# Patient Record
Sex: Male | Born: 1945 | Race: White | Hispanic: No | Marital: Married | Smoking: Former smoker
Health system: Southern US, Community
[De-identification: ages and names within clinical notes are randomized; demographics above are authoritative.]

## PROBLEM LIST (undated history)

## (undated) DIAGNOSIS — N184 Chronic kidney disease, stage 4 (severe): Secondary | ICD-10-CM

## (undated) DIAGNOSIS — Z7902 Long term (current) use of antithrombotics/antiplatelets: Secondary | ICD-10-CM

## (undated) DIAGNOSIS — K219 Gastro-esophageal reflux disease without esophagitis: Secondary | ICD-10-CM

## (undated) DIAGNOSIS — J449 Chronic obstructive pulmonary disease, unspecified: Secondary | ICD-10-CM

## (undated) DIAGNOSIS — R011 Cardiac murmur, unspecified: Secondary | ICD-10-CM

## (undated) DIAGNOSIS — Z9889 Other specified postprocedural states: Secondary | ICD-10-CM

## (undated) DIAGNOSIS — I358 Other nonrheumatic aortic valve disorders: Secondary | ICD-10-CM

## (undated) DIAGNOSIS — K7460 Unspecified cirrhosis of liver: Secondary | ICD-10-CM

## (undated) DIAGNOSIS — Z87891 Personal history of nicotine dependence: Secondary | ICD-10-CM

## (undated) DIAGNOSIS — Z7982 Long term (current) use of aspirin: Secondary | ICD-10-CM

## (undated) DIAGNOSIS — M1990 Unspecified osteoarthritis, unspecified site: Secondary | ICD-10-CM

## (undated) DIAGNOSIS — I2510 Atherosclerotic heart disease of native coronary artery without angina pectoris: Secondary | ICD-10-CM

## (undated) DIAGNOSIS — K922 Gastrointestinal hemorrhage, unspecified: Secondary | ICD-10-CM

## (undated) DIAGNOSIS — K5730 Diverticulosis of large intestine without perforation or abscess without bleeding: Secondary | ICD-10-CM

## (undated) DIAGNOSIS — E7800 Pure hypercholesterolemia, unspecified: Secondary | ICD-10-CM

## (undated) DIAGNOSIS — D509 Iron deficiency anemia, unspecified: Secondary | ICD-10-CM

## (undated) DIAGNOSIS — I5189 Other ill-defined heart diseases: Secondary | ICD-10-CM

## (undated) DIAGNOSIS — Z79891 Long term (current) use of opiate analgesic: Secondary | ICD-10-CM

## (undated) DIAGNOSIS — K635 Polyp of colon: Secondary | ICD-10-CM

## (undated) DIAGNOSIS — Z87442 Personal history of urinary calculi: Secondary | ICD-10-CM

## (undated) DIAGNOSIS — Z9289 Personal history of other medical treatment: Secondary | ICD-10-CM

## (undated) DIAGNOSIS — N183 Chronic kidney disease, stage 3 (moderate): Secondary | ICD-10-CM

## (undated) DIAGNOSIS — N2581 Secondary hyperparathyroidism of renal origin: Secondary | ICD-10-CM

## (undated) DIAGNOSIS — I700 Atherosclerosis of aorta: Secondary | ICD-10-CM

## (undated) DIAGNOSIS — I10 Essential (primary) hypertension: Secondary | ICD-10-CM

## (undated) DIAGNOSIS — C689 Malignant neoplasm of urinary organ, unspecified: Secondary | ICD-10-CM

## (undated) DIAGNOSIS — I70219 Atherosclerosis of native arteries of extremities with intermittent claudication, unspecified extremity: Secondary | ICD-10-CM

## (undated) DIAGNOSIS — D631 Anemia in chronic kidney disease: Secondary | ICD-10-CM

## (undated) DIAGNOSIS — J189 Pneumonia, unspecified organism: Secondary | ICD-10-CM

## (undated) DIAGNOSIS — C679 Malignant neoplasm of bladder, unspecified: Secondary | ICD-10-CM

## (undated) HISTORY — DX: Chronic kidney disease, stage 3 (moderate): N18.3

## (undated) HISTORY — DX: Malignant neoplasm of bladder, unspecified: C67.9

## (undated) HISTORY — PX: FEMUR FRACTURE SURGERY: SHX633

## (undated) HISTORY — PX: MCT 3D RECONSTRUCTION (ARMC HX): HXRAD1281

## (undated) HISTORY — PX: OTHER SURGICAL HISTORY: SHX169

## (undated) HISTORY — PX: CARDIAC CATHETERIZATION: SHX172

## (undated) HISTORY — DX: Malignant neoplasm of urinary organ, unspecified: C68.9

---

## 2005-06-12 ENCOUNTER — Ambulatory Visit: Payer: Self-pay | Admitting: Pain Medicine

## 2005-06-19 ENCOUNTER — Ambulatory Visit: Payer: Self-pay | Admitting: Pain Medicine

## 2005-06-27 ENCOUNTER — Ambulatory Visit: Payer: Self-pay | Admitting: Pain Medicine

## 2005-07-08 ENCOUNTER — Ambulatory Visit: Payer: Self-pay | Admitting: Pain Medicine

## 2005-07-25 ENCOUNTER — Ambulatory Visit: Payer: Self-pay | Admitting: Pain Medicine

## 2005-08-28 ENCOUNTER — Ambulatory Visit: Payer: Self-pay | Admitting: Pain Medicine

## 2005-09-02 ENCOUNTER — Emergency Department (HOSPITAL_COMMUNITY): Admission: EM | Admit: 2005-09-02 | Discharge: 2005-09-02 | Payer: Self-pay | Admitting: Emergency Medicine

## 2005-09-17 ENCOUNTER — Ambulatory Visit: Payer: Self-pay | Admitting: Pain Medicine

## 2005-09-30 ENCOUNTER — Ambulatory Visit: Payer: Self-pay | Admitting: Pain Medicine

## 2005-10-08 ENCOUNTER — Ambulatory Visit: Payer: Self-pay | Admitting: Pain Medicine

## 2005-11-04 ENCOUNTER — Ambulatory Visit: Payer: Self-pay | Admitting: Pain Medicine

## 2005-11-26 ENCOUNTER — Ambulatory Visit: Payer: Self-pay | Admitting: Pain Medicine

## 2005-12-26 ENCOUNTER — Ambulatory Visit: Payer: Self-pay | Admitting: Pain Medicine

## 2006-01-22 ENCOUNTER — Ambulatory Visit: Payer: Self-pay | Admitting: Pain Medicine

## 2006-02-18 ENCOUNTER — Ambulatory Visit: Payer: Self-pay | Admitting: Pain Medicine

## 2006-03-18 ENCOUNTER — Ambulatory Visit: Payer: Self-pay | Admitting: Pain Medicine

## 2006-04-15 ENCOUNTER — Ambulatory Visit: Payer: Self-pay | Admitting: Pain Medicine

## 2006-04-23 ENCOUNTER — Ambulatory Visit: Payer: Self-pay | Admitting: Pain Medicine

## 2006-05-08 ENCOUNTER — Ambulatory Visit: Payer: Self-pay | Admitting: Pain Medicine

## 2006-06-09 ENCOUNTER — Ambulatory Visit: Payer: Self-pay | Admitting: Pain Medicine

## 2006-07-08 ENCOUNTER — Ambulatory Visit: Payer: Self-pay | Admitting: Pain Medicine

## 2006-08-07 ENCOUNTER — Ambulatory Visit: Payer: Self-pay | Admitting: Pain Medicine

## 2006-08-13 ENCOUNTER — Ambulatory Visit: Payer: Self-pay | Admitting: Pain Medicine

## 2006-09-01 ENCOUNTER — Ambulatory Visit: Payer: Self-pay | Admitting: Pain Medicine

## 2006-09-29 ENCOUNTER — Ambulatory Visit: Payer: Self-pay | Admitting: Pain Medicine

## 2006-10-27 ENCOUNTER — Ambulatory Visit: Payer: Self-pay | Admitting: Pain Medicine

## 2006-11-27 ENCOUNTER — Ambulatory Visit: Payer: Self-pay | Admitting: Pain Medicine

## 2006-12-17 ENCOUNTER — Ambulatory Visit: Payer: Self-pay | Admitting: Pain Medicine

## 2007-01-01 ENCOUNTER — Ambulatory Visit: Payer: Self-pay | Admitting: Pain Medicine

## 2007-01-21 ENCOUNTER — Ambulatory Visit: Payer: Self-pay | Admitting: Pain Medicine

## 2007-03-05 ENCOUNTER — Ambulatory Visit: Payer: Self-pay | Admitting: Pain Medicine

## 2007-03-23 ENCOUNTER — Ambulatory Visit: Payer: Self-pay | Admitting: Pain Medicine

## 2007-04-23 ENCOUNTER — Ambulatory Visit: Payer: Self-pay | Admitting: Pain Medicine

## 2007-04-27 ENCOUNTER — Ambulatory Visit: Payer: Self-pay | Admitting: Pain Medicine

## 2007-05-26 ENCOUNTER — Ambulatory Visit: Payer: Self-pay | Admitting: Pain Medicine

## 2007-06-29 ENCOUNTER — Ambulatory Visit: Payer: Self-pay | Admitting: Pain Medicine

## 2007-07-28 ENCOUNTER — Ambulatory Visit: Payer: Self-pay | Admitting: Pain Medicine

## 2007-08-12 ENCOUNTER — Ambulatory Visit: Payer: Self-pay | Admitting: Pain Medicine

## 2007-09-03 ENCOUNTER — Ambulatory Visit: Payer: Self-pay | Admitting: Pain Medicine

## 2007-09-28 ENCOUNTER — Ambulatory Visit: Payer: Self-pay | Admitting: Pain Medicine

## 2007-10-29 ENCOUNTER — Ambulatory Visit: Payer: Self-pay | Admitting: Pain Medicine

## 2007-11-25 ENCOUNTER — Ambulatory Visit: Payer: Self-pay | Admitting: Pain Medicine

## 2007-12-22 ENCOUNTER — Ambulatory Visit: Payer: Self-pay | Admitting: Pain Medicine

## 2008-01-18 ENCOUNTER — Ambulatory Visit: Payer: Self-pay | Admitting: Pain Medicine

## 2008-02-18 ENCOUNTER — Ambulatory Visit: Payer: Self-pay | Admitting: Pain Medicine

## 2008-03-16 ENCOUNTER — Ambulatory Visit: Payer: Self-pay | Admitting: Pain Medicine

## 2008-04-21 ENCOUNTER — Ambulatory Visit: Payer: Self-pay | Admitting: Pain Medicine

## 2008-05-18 ENCOUNTER — Ambulatory Visit: Payer: Self-pay | Admitting: Pain Medicine

## 2008-06-16 ENCOUNTER — Ambulatory Visit: Payer: Self-pay | Admitting: Pain Medicine

## 2008-06-27 ENCOUNTER — Ambulatory Visit: Payer: Self-pay | Admitting: Pain Medicine

## 2008-07-18 ENCOUNTER — Ambulatory Visit: Payer: Self-pay | Admitting: Pain Medicine

## 2008-08-18 ENCOUNTER — Ambulatory Visit: Payer: Self-pay | Admitting: Pain Medicine

## 2008-09-05 ENCOUNTER — Ambulatory Visit: Payer: Self-pay | Admitting: Pain Medicine

## 2008-10-05 ENCOUNTER — Ambulatory Visit: Payer: Self-pay | Admitting: Pain Medicine

## 2008-10-31 ENCOUNTER — Ambulatory Visit: Payer: Self-pay | Admitting: Pain Medicine

## 2008-11-28 ENCOUNTER — Ambulatory Visit: Payer: Self-pay | Admitting: Pain Medicine

## 2008-12-28 ENCOUNTER — Ambulatory Visit: Payer: Self-pay | Admitting: Pain Medicine

## 2009-01-30 ENCOUNTER — Ambulatory Visit: Payer: Self-pay | Admitting: Pain Medicine

## 2009-04-03 ENCOUNTER — Ambulatory Visit: Payer: Self-pay | Admitting: Pain Medicine

## 2009-05-02 ENCOUNTER — Ambulatory Visit: Payer: Self-pay | Admitting: Pain Medicine

## 2009-05-15 ENCOUNTER — Ambulatory Visit: Payer: Self-pay | Admitting: Pain Medicine

## 2009-05-23 ENCOUNTER — Ambulatory Visit: Payer: Self-pay | Admitting: Pain Medicine

## 2009-07-05 ENCOUNTER — Ambulatory Visit: Payer: Self-pay | Admitting: Pain Medicine

## 2010-05-23 ENCOUNTER — Ambulatory Visit: Payer: Self-pay | Admitting: Pain Medicine

## 2010-06-01 ENCOUNTER — Ambulatory Visit: Payer: Self-pay | Admitting: Pain Medicine

## 2010-06-21 ENCOUNTER — Ambulatory Visit: Payer: Self-pay | Admitting: Pain Medicine

## 2010-07-09 ENCOUNTER — Ambulatory Visit: Payer: Self-pay | Admitting: Pain Medicine

## 2010-07-24 ENCOUNTER — Ambulatory Visit: Payer: Self-pay | Admitting: Pain Medicine

## 2010-08-23 ENCOUNTER — Ambulatory Visit: Payer: Self-pay | Admitting: Pain Medicine

## 2010-09-24 ENCOUNTER — Ambulatory Visit: Payer: Self-pay | Admitting: Pain Medicine

## 2011-04-30 ENCOUNTER — Ambulatory Visit: Payer: Self-pay | Admitting: Pain Medicine

## 2011-05-15 ENCOUNTER — Ambulatory Visit: Payer: Self-pay | Admitting: Pain Medicine

## 2011-06-06 ENCOUNTER — Ambulatory Visit: Payer: Self-pay | Admitting: Pain Medicine

## 2011-06-26 ENCOUNTER — Ambulatory Visit: Payer: Self-pay | Admitting: Pain Medicine

## 2011-07-25 ENCOUNTER — Ambulatory Visit: Payer: Self-pay | Admitting: Pain Medicine

## 2011-08-21 ENCOUNTER — Ambulatory Visit: Payer: Self-pay | Admitting: Pain Medicine

## 2011-09-11 ENCOUNTER — Ambulatory Visit: Payer: Self-pay | Admitting: Pain Medicine

## 2011-10-09 ENCOUNTER — Ambulatory Visit: Payer: Self-pay | Admitting: Pain Medicine

## 2011-11-07 ENCOUNTER — Ambulatory Visit: Payer: Self-pay | Admitting: Pain Medicine

## 2011-12-11 ENCOUNTER — Ambulatory Visit: Payer: Self-pay | Admitting: Pain Medicine

## 2012-01-09 ENCOUNTER — Ambulatory Visit: Payer: Self-pay | Admitting: Pain Medicine

## 2012-02-05 ENCOUNTER — Ambulatory Visit: Payer: Self-pay | Admitting: Pain Medicine

## 2012-02-07 ENCOUNTER — Ambulatory Visit: Payer: Self-pay | Admitting: Pain Medicine

## 2012-02-26 ENCOUNTER — Ambulatory Visit: Payer: Self-pay | Admitting: Pain Medicine

## 2012-04-02 ENCOUNTER — Ambulatory Visit: Payer: Self-pay | Admitting: Pain Medicine

## 2012-04-29 ENCOUNTER — Ambulatory Visit: Payer: Self-pay | Admitting: Pain Medicine

## 2012-06-02 ENCOUNTER — Ambulatory Visit: Payer: Self-pay | Admitting: Pain Medicine

## 2012-07-01 ENCOUNTER — Ambulatory Visit: Payer: Self-pay | Admitting: Pain Medicine

## 2012-07-30 ENCOUNTER — Ambulatory Visit: Payer: Self-pay | Admitting: Pain Medicine

## 2012-08-31 ENCOUNTER — Ambulatory Visit: Payer: Self-pay | Admitting: Pain Medicine

## 2012-09-30 ENCOUNTER — Ambulatory Visit: Payer: Self-pay | Admitting: Pain Medicine

## 2016-11-05 ENCOUNTER — Ambulatory Visit (INDEPENDENT_AMBULATORY_CARE_PROVIDER_SITE_OTHER): Payer: Medicare HMO | Admitting: Urology

## 2016-11-05 DIAGNOSIS — R310 Gross hematuria: Secondary | ICD-10-CM | POA: Diagnosis not present

## 2016-11-05 DIAGNOSIS — C673 Malignant neoplasm of anterior wall of bladder: Secondary | ICD-10-CM | POA: Diagnosis not present

## 2016-11-07 ENCOUNTER — Other Ambulatory Visit: Payer: Self-pay | Admitting: Urology

## 2016-11-07 DIAGNOSIS — R310 Gross hematuria: Secondary | ICD-10-CM

## 2016-11-07 DIAGNOSIS — C673 Malignant neoplasm of anterior wall of bladder: Secondary | ICD-10-CM

## 2016-11-12 ENCOUNTER — Telehealth: Payer: Self-pay | Admitting: Radiology

## 2016-11-12 NOTE — Telephone Encounter (Signed)
No answer & unable to LM. Need to discuss surgery scheduled with Dr Diona Fanti at [facility].

## 2016-11-13 ENCOUNTER — Other Ambulatory Visit: Payer: Self-pay | Admitting: Radiology

## 2016-11-13 DIAGNOSIS — C679 Malignant neoplasm of bladder, unspecified: Secondary | ICD-10-CM

## 2016-11-13 NOTE — Telephone Encounter (Signed)
Notified pt of surgery scheduled with Dr Diona Fanti at [facility] on 11/19/16, pre-op appt at [facility] on 11/15/16 @7 :00 & post-op appt with Dr Diona Fanti at the [place] office on 12/25/15 @10 :30. Pt voices understanding.

## 2016-11-14 ENCOUNTER — Inpatient Hospital Stay (HOSPITAL_COMMUNITY): Admission: RE | Admit: 2016-11-14 | Payer: Medicare HMO | Source: Ambulatory Visit

## 2016-11-14 NOTE — Patient Instructions (Addendum)
Jeffery Ponce  11/14/2016     @PREFPERIOPPHARMACY @   Your procedure is scheduled on 11/19/16.  Report to [facility] at 6:15 A.M.  Call this number if you have problems the morning of surgery:  [phone]   Remember:  Do not eat food or drink liquids after midnight.  Take these medicines the morning of surgery with A SIP OF WATER Valium, Oxycodone   Do not wear jewelry, make-up or nail polish.  Do not wear lotions, powders, or perfumes, or deoderant.  Do not shave 48 hours prior to surgery.  Men may shave face and neck.  Do not bring valuables to the hospital.  [facility] is not responsible for any belongings or valuables.  Contacts, dentures or bridgework may not be worn into surgery.  Leave your suitcase in the car.  After surgery it may be brought to your room.  For patients admitted to the hospital, discharge time will be determined by your treatment team.  Patients discharged the day of surgery will not be allowed to drive home.    Please read over the following fact sheets that you were given. Surgical Site Infection Prevention and Anesthesia Post-op Instructions     PATIENT INSTRUCTIONS POST-ANESTHESIA  IMMEDIATELY FOLLOWING SURGERY:  Do not drive or operate machinery for the first twenty four hours after surgery.  Do not make any important decisions for twenty four hours after surgery or while taking narcotic pain medications or sedatives.  If you develop intractable nausea and vomiting or a severe headache please notify your doctor immediately.  FOLLOW-UP:  Please make an appointment with your surgeon as instructed. You do not need to follow up with anesthesia unless specifically instructed to do so.  WOUND CARE INSTRUCTIONS (if applicable):  Keep a dry clean dressing on the anesthesia/puncture wound site if there is drainage.  Once the wound has quit draining you may leave it open to air.  Generally you should leave the bandage intact for twenty four hours  unless there is drainage.  If the epidural site drains for more than 36-48 hours please call the anesthesia department.  QUESTIONS?:  Please feel free to call your physician or the hospital operator if you have any questions, and they will be happy to assist you.      Transurethral Resection of Bladder Tumor Transurethral resection of a bladder tumor is the removal (resection) of a cancerous growth (tumor) on the inside wall of the bladder. The bladder is the organ that holds urine. The tumor is removed through the tube that carries urine out of the body (urethra). In a transurethral resection, a thin telescope with a light, a tiny camera, and an electric cutting edge (resectoscope) is passed through the urethra. In men, the opening of the urethra is at the end of the penis. In women, it is just above the vaginal opening. Tell a health care provider about:  Any allergies you have.  All medicines you are taking, including vitamins, herbs, eye drops, creams, and over-the-counter medicines.  Any problems you or family members have had with anesthetic medicines.  Any blood disorders you have.  Any surgeries you have had.  Any medical conditions you have.  Any recent urinary tract infections you have had.  Whether you are pregnant or may be pregnant. What are the risks? Generally, this is a safe procedure. However, problems may occur, including:  Infection.  Bleeding.  Allergic reactions to medicines.  Damage to other structures or organs, such as:  The urethra.  The tubes that drain urine from the kidneys into the bladder (ureters).  Pain and burning during urination.  Difficulty urinating due to partial blockage of the urethra.  Inability to urinate (urinary retention). What happens before the procedure?  Follow instructions from your health care provider about eating and drinking restrictions.  Ask your health care provider about:  Changing or stopping your regular  medicines. This is especially important if you are taking diabetes medicines or blood thinners.  Taking medicines such as aspirin and ibuprofen. These medicines can thin your blood. Do not take these medicines before your procedure if your health care provider instructs you not to.  You may have a physical exam.  You may have tests, including:  Blood tests.  Urine tests.  Electrocardiogram (ECG). This test measures the electrical activity of the heart.  You may be given antibiotic medicine to help prevent infection.  Ask your health care provider how your surgical site will be marked or identified.  Plan to have someone take you home after the procedure. What happens during the procedure?  To reduce your risk of infection:  Your health care team will wash or sanitize their hands.  Your skin will be washed with soap.  An IV tube will be inserted into one of your veins.  You will be given one or more of the following:  A medicine to help you relax (sedative).  A medicine to make you fall asleep (general anesthetic).  A medicine that is injected into your spine to numb the area below and slightly above the injection site (spinal anesthetic).  Your legs will be placed in foot rests (stirrups) so that your legs are apart and your knees are bent.  The resectoscope will be passed through your urethra and into your bladder.  The part of your bladder that is affected by the tumor will be resected using the cutting edge of the resectoscope.  The resectoscope will be removed.  A thin, flexible tube (catheter) will be passed through your urethra and into your bladder. The catheter will drain urine into a bag outside of your body.  Fluid may be passed through the catheter to keep the catheter open. The procedure may vary among health care providers and hospitals. What happens after the procedure?  Your blood pressure, heart rate, breathing rate, and blood oxygen level will be  monitored often until the medicines you were given have worn off.  You may continue to receive fluids and medicines through an IV tube.  You will have some pain. Pain medicine will be available to help you.  You will have a catheter draining your urine.  You will have blood in your urine. Your catheter may be kept in until your urine is clear.  Your urinary drainage will be monitored. If necessary, your bladder may be rinsed out (irrigated) by passing fluid through your catheter.  You will be encouraged to walk around as soon as possible.  You may have to wear compression stockings. These stockings help to prevent blood clots and reduce swelling in your legs.  Do not drive for 24 hours if you received a sedative. This information is not intended to replace advice given to you by your health care provider. Make sure you discuss any questions you have with your health care provider. Document Released: 09/21/2009 Document Revised: 07/28/2016 Document Reviewed: 08/17/2015 Elsevier Interactive Patient Education  2017 [employer] PATIENT INSTRUCTIONS POST-ANESTHESIA  IMMEDIATELY FOLLOWING SURGERY:  Do not drive or  operate machinery for the first twenty four hours after surgery.  Do not make any important decisions for twenty four hours after surgery or while taking narcotic pain medications or sedatives.  If you develop intractable nausea and vomiting or a severe headache please notify your doctor immediately.  FOLLOW-UP:  Please make an appointment with your surgeon as instructed. You do not need to follow up with anesthesia unless specifically instructed to do so.  WOUND CARE INSTRUCTIONS (if applicable):  Keep a dry clean dressing on the anesthesia/puncture wound site if there is drainage.  Once the wound has quit draining you may leave it open to air.  Generally you should leave the bandage intact for twenty four hours unless there is drainage.  If the epidural site drains for more than  36-48 hours please call the anesthesia department.  QUESTIONS?:  Please feel free to call your physician or the hospital operator if you have any questions, and they will be happy to assist you.

## 2016-11-15 ENCOUNTER — Encounter (HOSPITAL_COMMUNITY)
Admission: RE | Admit: 2016-11-15 | Discharge: 2016-11-15 | Disposition: A | Discharge status: Home / Self Care | Payer: Medicare HMO | Source: Ambulatory Visit | Attending: Urology | Admitting: Urology

## 2016-11-15 ENCOUNTER — Other Ambulatory Visit: Payer: Self-pay

## 2016-11-15 ENCOUNTER — Encounter (HOSPITAL_COMMUNITY): Payer: Self-pay

## 2016-11-15 ENCOUNTER — Ambulatory Visit (HOSPITAL_COMMUNITY)
Admission: RE | Admit: 2016-11-15 | Discharge: 2016-11-15 | Disposition: A | Discharge status: Home / Self Care | Payer: Medicare HMO | Source: Ambulatory Visit | Attending: Urology | Admitting: Urology

## 2016-11-15 DIAGNOSIS — Z01818 Encounter for other preprocedural examination: Secondary | ICD-10-CM | POA: Diagnosis not present

## 2016-11-15 DIAGNOSIS — K8020 Calculus of gallbladder without cholecystitis without obstruction: Secondary | ICD-10-CM | POA: Insufficient documentation

## 2016-11-15 DIAGNOSIS — R310 Gross hematuria: Secondary | ICD-10-CM | POA: Diagnosis not present

## 2016-11-15 DIAGNOSIS — C673 Malignant neoplasm of anterior wall of bladder: Secondary | ICD-10-CM | POA: Insufficient documentation

## 2016-11-15 DIAGNOSIS — Z01812 Encounter for preprocedural laboratory examination: Secondary | ICD-10-CM | POA: Diagnosis present

## 2016-11-15 HISTORY — DX: Unspecified osteoarthritis, unspecified site: M19.90

## 2016-11-15 HISTORY — DX: Pure hypercholesterolemia, unspecified: E78.00

## 2016-11-15 HISTORY — DX: Personal history of urinary calculi: Z87.442

## 2016-11-15 LAB — BASIC METABOLIC PANEL
ANION GAP: 7 (ref 5–15)
BUN: 19 mg/dL (ref 6–20)
CO2: 27 mmol/L (ref 22–32)
Calcium: 9.5 mg/dL (ref 8.9–10.3)
Chloride: 104 mmol/L (ref 101–111)
Creatinine, Ser: 1.22 mg/dL (ref 0.61–1.24)
GFR calc Af Amer: >60 mL/min (ref >60)
GFR, EST NON AFRICAN AMERICAN: 58 mL/min — AB (ref >60)
GLUCOSE: 131 mg/dL — AB (ref 65–99)
POTASSIUM: 4.5 mmol/L (ref 3.5–5.1)
Sodium: 138 mmol/L (ref 135–145)

## 2016-11-15 LAB — CBC
HEMATOCRIT: 45.2 % (ref 39.0–52.0)
Hemoglobin: 14.6 g/dL (ref 13.0–17.0)
MCH: 30.5 pg (ref 26.0–34.0)
MCHC: 32.3 g/dL (ref 30.0–36.0)
MCV: 94.4 fL (ref 78.0–100.0)
PLATELETS: 254 10*3/uL (ref 150–400)
RBC: 4.79 MIL/uL (ref 4.22–5.81)
RDW: 13.1 % (ref 11.5–15.5)
WBC: 9.9 10*3/uL (ref 4.0–10.5)

## 2016-11-15 MED ORDER — IOPAMIDOL (ISOVUE-300) INJECTION 61%
125.0000 mL | Freq: Once | INTRAVENOUS | Status: AC | PRN
  Administered 2016-11-15: 125 mL via INTRAVENOUS
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
## 2016-11-19 ENCOUNTER — Ambulatory Visit (HOSPITAL_COMMUNITY): Payer: Medicare HMO | Admitting: Anesthesiology

## 2016-11-19 ENCOUNTER — Encounter (HOSPITAL_COMMUNITY): Payer: Self-pay | Admitting: *Deleted

## 2016-11-19 ENCOUNTER — Encounter (HOSPITAL_COMMUNITY)
Admission: RE | Disposition: A | Discharge status: Home / Self Care | Payer: Self-pay | Source: Ambulatory Visit | Attending: Urology

## 2016-11-19 ENCOUNTER — Ambulatory Visit (HOSPITAL_COMMUNITY)
Admission: RE | Admit: 2016-11-19 | Discharge: 2016-11-19 | Disposition: A | Discharge status: Home / Self Care | Payer: Medicare HMO | Source: Ambulatory Visit | Attending: Urology | Admitting: Urology

## 2016-11-19 DIAGNOSIS — F17210 Nicotine dependence, cigarettes, uncomplicated: Secondary | ICD-10-CM | POA: Insufficient documentation

## 2016-11-19 DIAGNOSIS — R310 Gross hematuria: Secondary | ICD-10-CM | POA: Diagnosis not present

## 2016-11-19 DIAGNOSIS — D494 Neoplasm of unspecified behavior of bladder: Secondary | ICD-10-CM | POA: Diagnosis not present

## 2016-11-19 DIAGNOSIS — M1990 Unspecified osteoarthritis, unspecified site: Secondary | ICD-10-CM | POA: Diagnosis not present

## 2016-11-19 DIAGNOSIS — Z87892 Personal history of anaphylaxis: Secondary | ICD-10-CM | POA: Insufficient documentation

## 2016-11-19 DIAGNOSIS — C679 Malignant neoplasm of bladder, unspecified: Secondary | ICD-10-CM

## 2016-11-19 DIAGNOSIS — Z87442 Personal history of urinary calculi: Secondary | ICD-10-CM | POA: Diagnosis not present

## 2016-11-19 DIAGNOSIS — Z9889 Other specified postprocedural states: Secondary | ICD-10-CM | POA: Insufficient documentation

## 2016-11-19 DIAGNOSIS — Z880 Allergy status to penicillin: Secondary | ICD-10-CM | POA: Diagnosis not present

## 2016-11-19 DIAGNOSIS — C675 Malignant neoplasm of bladder neck: Secondary | ICD-10-CM | POA: Insufficient documentation

## 2016-11-19 DIAGNOSIS — E7800 Pure hypercholesterolemia, unspecified: Secondary | ICD-10-CM | POA: Insufficient documentation

## 2016-11-19 HISTORY — PX: TRANSURETHRAL RESECTION OF BLADDER TUMOR: SHX2575

## 2016-11-19 SURGERY — TURBT (TRANSURETHRAL RESECTION OF BLADDER TUMOR)
Anesthesia: General | Site: Bladder

## 2016-11-19 MED ORDER — MIDAZOLAM HCL 2 MG/2ML IJ SOLN
INTRAMUSCULAR | Status: AC
  Filled 2016-11-19: qty 2

## 2016-11-19 MED ORDER — PROPOFOL 10 MG/ML IV BOLUS
INTRAVENOUS | Status: DC | PRN
  Administered 2016-11-19: 20 mg via INTRAVENOUS
  Administered 2016-11-19: 120 mg via INTRAVENOUS
  Administered 2016-11-19: 20 mg via INTRAVENOUS

## 2016-11-19 MED ORDER — FENTANYL CITRATE (PF) 100 MCG/2ML IJ SOLN
25.0000 ug | INTRAMUSCULAR | Status: AC | PRN
  Administered 2016-11-19 (×2): 25 ug via INTRAVENOUS

## 2016-11-19 MED ORDER — LACTATED RINGERS IV SOLN
INTRAVENOUS | Status: DC
  Administered 2016-11-19: 07:00:00 via INTRAVENOUS

## 2016-11-19 MED ORDER — LIDOCAINE HCL (CARDIAC) 10 MG/ML IV SOLN
INTRAVENOUS | Status: DC | PRN
  Administered 2016-11-19: 50 mg via INTRAVENOUS

## 2016-11-19 MED ORDER — PROPOFOL 10 MG/ML IV BOLUS
INTRAVENOUS | Status: AC
  Filled 2016-11-19: qty 20

## 2016-11-19 MED ORDER — FENTANYL CITRATE (PF) 100 MCG/2ML IJ SOLN
25.0000 ug | INTRAMUSCULAR | Status: DC | PRN
  Administered 2016-11-19: 25 ug via INTRAVENOUS
  Administered 2016-11-19: 50 ug via INTRAVENOUS
  Administered 2016-11-19: 25 ug via INTRAVENOUS
  Filled 2016-11-19 (×2): qty 2

## 2016-11-19 MED ORDER — OXYBUTYNIN CHLORIDE 5 MG PO TABS: 5.0000 mg | ORAL_TABLET | Freq: Three times a day (TID) | ORAL | 1 refills | Status: DC | PRN

## 2016-11-19 MED ORDER — FENTANYL CITRATE (PF) 100 MCG/2ML IJ SOLN
INTRAMUSCULAR | Status: AC
  Filled 2016-11-19: qty 2

## 2016-11-19 MED ORDER — FENTANYL CITRATE (PF) 100 MCG/2ML IJ SOLN
INTRAMUSCULAR | Status: DC | PRN
  Administered 2016-11-19 (×4): 25 ug via INTRAVENOUS

## 2016-11-19 MED ORDER — MIDAZOLAM HCL 2 MG/2ML IJ SOLN
1.0000 mg | INTRAMUSCULAR | Status: DC | PRN
  Administered 2016-11-19: 2 mg via INTRAVENOUS

## 2016-11-19 MED ORDER — LIDOCAINE HCL (PF) 1 % IJ SOLN
INTRAMUSCULAR | Status: AC
  Filled 2016-11-19: qty 10

## 2016-11-19 MED ORDER — CIPROFLOXACIN IN D5W 400 MG/200ML IV SOLN
400.0000 mg | INTRAVENOUS | Status: AC
  Administered 2016-11-19: 400 mg via INTRAVENOUS
  Filled 2016-11-19: qty 200

## 2016-11-19 MED ORDER — ONDANSETRON 4 MG PO TBDP
ORAL_TABLET | ORAL | Status: AC
  Filled 2016-11-19: qty 1

## 2016-11-19 MED ORDER — STERILE WATER FOR IRRIGATION IR SOLN
Status: DC | PRN
  Administered 2016-11-19 (×2): 3000 mL

## 2016-11-19 MED ORDER — EPIRUBICIN HCL CHEMO IV INJECTION 50 MG/25ML
50.0000 mg | Freq: Once | INTRAVENOUS | Status: DC
Start: 2016-11-19 — End: 2016-11-19
  Filled 2016-11-19: qty 25

## 2016-11-19 MED ORDER — ONDANSETRON 4 MG PO TBDP
4.0000 mg | ORAL_TABLET | Freq: Once | ORAL | Status: AC
  Administered 2016-11-19: 4 mg via ORAL

## 2016-11-19 SURGICAL SUPPLY — 30 items
BAG DRAIN URO TABLE W/ADPT NS (DRAPE) ×3 IMPLANT
BAG DRN 8 ADPR NS SKTRN CSTL (DRAPE) ×1
BAG HAMPER (MISCELLANEOUS) ×3 IMPLANT
BAG URINE DRAINAGE (UROLOGICAL SUPPLIES) IMPLANT
BAG URINE LEG 500ML (DRAIN) ×1 IMPLANT
CATH FOLEY 2WAY SLVR  5CC 20FR (CATHETERS) ×2
CATH FOLEY 2WAY SLVR 5CC 20FR (CATHETERS) IMPLANT
CLOTH BEACON ORANGE TIMEOUT ST (SAFETY) ×3 IMPLANT
ELECT LOOP 22F BIPOLAR SML (ELECTROSURGICAL)
ELECT REM PT RETURN 9FT ADLT (ELECTROSURGICAL) ×3
ELECTRODE LOOP 22F BIPOLAR SML (ELECTROSURGICAL) IMPLANT
ELECTRODE REM PT RTRN 9FT ADLT (ELECTROSURGICAL) ×1 IMPLANT
EVACUATOR MICROVAS BLADDER (UROLOGICAL SUPPLIES) IMPLANT
GLOVE BIO SURGEON STRL SZ8 (GLOVE) ×3 IMPLANT
GOWN PREVENTION PLUS LG XLONG (DISPOSABLE) ×3 IMPLANT
GOWN STRL REIN XL XLG (GOWN DISPOSABLE) ×3 IMPLANT
IV NS IRRIG 3000ML ARTHROMATIC (IV SOLUTION) IMPLANT
KIT CHEMO SPILL (MISCELLANEOUS) ×3 IMPLANT
KIT ROOM TURNOVER AP CYSTO (KITS) ×3 IMPLANT
LOOP MONOPOLAR YLW (ELECTROSURGICAL) ×2 IMPLANT
MANIFOLD NEPTUNE II (INSTRUMENTS) ×3 IMPLANT
PACK CYSTO (CUSTOM PROCEDURE TRAY) ×3 IMPLANT
PAD ARMBOARD 7.5X6 YLW CONV (MISCELLANEOUS) ×3 IMPLANT
PLUG CATH AND CAP STER (CATHETERS) ×2 IMPLANT
SYR 10ML LL (SYRINGE) ×3 IMPLANT
SYRINGE IRR TOOMEY STRL 70CC (SYRINGE) ×3 IMPLANT
TUBE CONNECTING 12'X1/4 (SUCTIONS) ×1
TUBE CONNECTING 12X1/4 (SUCTIONS) ×2 IMPLANT
WATER STERILE IRR 1000ML POUR (IV SOLUTION) ×3 IMPLANT
WATER STERILE IRR 3000ML UROMA (IV SOLUTION) ×4 IMPLANT

## 2016-11-19 NOTE — Progress Notes (Signed)
Epirubicin injected into  Bladder via foley catheter by [name]. Will drain at 9:20 per MD order.

## 2016-11-19 NOTE — Discharge Instructions (Signed)
Transurethral Resection of Bladder Tumor (TURBT)  Definition:  Transurethral Resection of the Bladder Tumor is a surgical procedure used to diagnose and remove tumors within the bladder. TURBT is the most common treatment for early stage bladder cancer.   General instructions:  Your recent bladder surgery requires very little post hospital care but some definite precautions.  Despite the fact that no skin incisions were used, the area around the tumor removal site is raw and covered with scabs to promote healing and prevent bleeding. Certain precautions are needed to insure that the scabs are not disturbed over the next 2-4 weeks while the healing proceeds.  Because the raw surface inside your bladder and the irritating effects of urine you may expect frequency of urination and/or urgency (a stronger desire to urinate) and perhaps even getting up at night more often. This will usually resolve or improve slowly over the healing period. You may see some blood in your urine over the first 6 weeks. Do not be alarmed, even if the urine was clear for a while. Get off your feet and drink lots of fluids until clearing occurs. If you start to pass clots or don't improve call us.  Remove catheter on Wednesday morning as instructed.  Diet:  You may return to your normal diet immediately. Because of the raw surface of your bladder, alcohol, spicy foods, foods high in acid and drinks with caffeine may cause irritation or frequency and should be used in moderation. To keep your urine flowing freely and avoid constipation, drink plenty of fluids during the day (8-10 glasses). Tip: Avoid cranberry juice because it is very acidic.   Activity:  Your physical activity needs to be restricted somewhat.  We suggest that you reduce your activity under the circumstances until the bleeding has stopped. Heavy lifting (greater than 20 lbs.) and heavy exertion should be limited for 2-3 weeks.  Bowels:  It is important to keep  your bowels regular during the postoperative period. Straining with bowel movements can cause bleeding. A bowel movement every other day is reasonable. Use a mild laxative if needed, such as milk of magnesia 2-3 tablespoons, or 2 Dulcolax tablets. Call if you continue to have problems. If you had been taking narcotics for pain, before, during or after your surgery, you may be constipated.   Medication:  You should resume your pre-surgery medications unless told not to. In addition you may be given an antibiotic to prevent or treat infection. Antibiotics are not always necessary. All medication should be taken as prescribed until the bottles are finished unless you are having an unusual reaction to one of the drugs.  General Anesthetic, Adult  A doctor specialized in giving anesthesia (anesthesiologist) or a nurse specialized in giving anesthesia (nurse anesthetist) gives medicine that makes you sleep while a procedure is performed (general anesthetic). Once the general anesthetic has been administered, you will be in a sleeplike state in which you feel no pain. After having a general anesthetic you may feel:  Dizzy.  Weak.  Drowsy.  Confused.  These feelings are normal and can be expected to last for up to 24 hours after the procedure.   LET YOUR CAREGIVER KNOW ABOUT:  Allergies you have.  Medications you are taking, including herbs, eye drops, over the counter medications, dietary supplements, and creams.  Previous problems you have had with anesthetics or numbing medicines.  Use of cigarettes, alcohol, or illicit drugs.  Possibility of pregnancy, if this applies.  History of bleeding or  blood disorders, including blood clots and clotting disorders.  Previous surgeries you have had and types of anesthetics you have received.  Family medical history, especially anesthetic problems.  Other health problems.   AFTER THE PROCEDURE  After surgery, you will be taken to the recovery area where a  nurse will monitor your progress. You will be allowed to go home when you are awake, stable, taking fluids well, and without serious pain or complications.  For the first 24 hours following an anesthetic:  Have a responsible person with you.  Do not drive a car. If you are alone, do not take public transportation.  Do not engage in strenuous activity. You may usually resume normal activities the next day, or as advised by your caregiver.  Do not drink alcohol.  Do not take medicine that has not been prescribed by your caregiver.  Do not sign important papers or make important decisions as your judgement may be impaired.  You may resume a normal diet as directed.  Change bandages (dressings) as directed.  Only take over-the-counter or prescription medicines for pain, discomfort, or fever as directed by your caregiver.  If you have questions or problems that seem related to the anesthetic, call the hospital and ask for the anesthetist, anesthesiologist, or anesthesia department.   SEEK IMMEDIATE MEDICAL CARE IF:  You develop a rash.  You have difficulty breathing.  You have chest pain.  You have allergic problems.  You have uncontrolled nausea.  You have uncontrolled vomiting.  You develop any serious bleeding, especially from the incision site.  Document Released: 03/03/2008 Document Revised: 08/07/2011 Document Reviewed: 03/28/2011  [facility] Patient Information 2012 [place].    Foley Catheter Care, Adult A Foley catheter is a soft, flexible tube. This tube is placed into your bladder to drain pee (urine). If you go home with this catheter in place, follow the instructions below. TAKING CARE OF THE CATHETER 1. Wash your hands with soap and water. 2. Put soap and water on a clean washcloth.  Clean the skin where the tube goes into your body.  Clean away from the tube site.  Never wipe toward the tube.  Clean the area using a circular motion.  Remove all the soap. Pat the  area dry with a clean towel. For males, reposition the skin that covers the end of the penis (foreskin). 3. Attach the tube to your leg with tape or a leg strap. Do not stretch the tube tight. If you are using tape, remove any stickiness left behind by past tape you used. 4. Keep the drainage bag below your hips. Keep it off the floor. 5. Check your tube during the day. Make sure it is working and draining. Make sure the tube does not curl, twist, or bend. 6. Do not pull on the tube or try to take it out. TAKING CARE OF THE DRAINAGE BAGS You will have a large overnight drainage bag and a small leg bag. You may wear the overnight bag any time. Never wear the small bag at night. Follow the directions below. Emptying the Drainage Bag  Empty your drainage bag when it is ?- full or at least 2-3 times a day. 1. Wash your hands with soap and water. 2. Keep the drainage bag below your hips. 3. Hold the dirty bag over the toilet or clean container. 4. Open the pour spout at the bottom of the bag. Empty the pee into the toilet or container. Do not let the pour  spout touch anything. 5. Clean the pour spout with a gauze pad or cotton ball that has rubbing alcohol on it. 6. Close the pour spout. 7. Attach the bag to your leg with tape or a leg strap. 8. Wash your hands well. Changing the Drainage Bag  Change your bag once a month or sooner if it starts to smell or look dirty.  1. Wash your hands with soap and water. 2. Pinch the rubber tube so that pee does not spill out. 3. Disconnect the catheter tube from the drainage tube at the connection valve. Do not let the tubes touch anything. 4. Clean the end of the catheter tube with an alcohol wipe. Clean the end of a the drainage tube with a different alcohol wipe. 5. Connect the catheter tube to the drainage tube of the clean drainage bag. 6. Attach the new bag to the leg with tape or a leg strap. Avoid attaching the new bag too tightly. 7. Wash your  hands well. Cleaning the Drainage Bag  1. Wash your hands with soap and water. 2. Wash the bag in warm, soapy water. 3. Rinse the bag with warm water. 4. Fill the bag with a mixture of white vinegar and water (1 cup vinegar to 1 quart warm water [.2 liter vinegar to 1 liter warm water]). Close the bag and soak it for 30 minutes in the solution. 5. Rinse the bag with warm water. 6. Hang the bag to dry with the pour spout open and hanging downward. 7. Store the clean bag (once it is dry) in a clean plastic bag. 8. Wash your hands well. PREVENT INFECTION  Wash your hands before and after touching your tube.  Take showers every day. Wash the skin where the tube enters your body. Do not take baths. Replace wet leg straps with dry ones, if this applies.  Do not use powders, sprays, or lotions on the genital area. Only use creams, lotions, or ointments as told by your doctor.  For females, wipe from front to back after going to the bathroom.  Drink enough fluids to keep your pee clear or pale yellow unless you are told not to have too much fluid (fluid restriction).  Do not let the drainage bag or tubing touch or lie on the floor.  Wear cotton underwear to keep the area dry. GET HELP IF:  Your pee is cloudy or smells unusually bad.  Your tube becomes clogged.  You are not draining pee into the bag or your bladder feels full.  Your tube starts to leak. GET HELP RIGHT AWAY IF:  You have pain, puffiness (swelling), redness, or yellowish-white fluid (pus) where the tube enters the body.  You have pain in the belly (abdomen), legs, lower back, or bladder.  You have a fever.  You see blood fill the tube, or your pee is pink or red.  You feel sick to your stomach (nauseous), throw up (vomit), or have chills.  Your tube gets pulled out. MAKE SURE YOU:   Understand these instructions.  Will watch your condition.  Will get help right away if you are not doing well or get  worse. This information is not intended to replace advice given to you by your health care provider. Make sure you discuss any questions you have with your health care provider. Document Released: 03/22/2013 Document Revised: 12/16/2014 Document Reviewed: 11/11/2015 Elsevier Interactive Patient Education  2017 [employer].

## 2016-11-19 NOTE — Transfer of Care (Signed)
Immediate Anesthesia Transfer of Care Note  Patient: Jeffery Ponce  Procedure(s) Performed: Procedure(s): TRANSURETHRAL RESECTION OF BLADDER TUMOR (TURBT) WITH EPIRUBICIN INJECTION (N/A)  Patient Location: PACU  Anesthesia Type:General  Level of Consciousness: awake and patient cooperative  Airway & Oxygen Therapy: Patient Spontanous Breathing and Patient connected to nasal cannula oxygen  Post-op Assessment: Report given to RN and Post -op Vital signs reviewed and stable  Post vital signs: Reviewed and stable  Last Vitals:  Vitals:   11/19/16 0725 11/19/16 0815  BP: 130/75   Resp: 17   Temp:  36.6 C    Last Pain:  Vitals:   11/19/16 0631  PainSc: 7       Patients Stated Pain Goal: 6 ([id number])  Complications: No apparent anesthesia complications

## 2016-11-19 NOTE — Anesthesia Procedure Notes (Signed)
Procedure Name: LMA Insertion Date/Time: 11/19/2016 7:38 AM Performed by: Vista Deck Pre-anesthesia Checklist: Patient identified, Patient being monitored, Emergency Drugs available, Timeout performed and Suction available Patient Re-evaluated:Patient Re-evaluated prior to inductionOxygen Delivery Method: Circle System Utilized Preoxygenation: Pre-oxygenation with 100% oxygen Intubation Type: IV induction Ventilation: Mask ventilation without difficulty LMA: LMA inserted LMA Size: 4.0 Number of attempts: 1 Placement Confirmation: positive ETCO2 and breath sounds checked- equal and bilateral Tube secured with: Tape Dental Injury: Teeth and Oropharynx as per pre-operative assessment

## 2016-11-19 NOTE — Op Note (Signed)
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  Postoperative diagnosis: Same  Principal procedure: TURBT of 2 centimeter anterior bladder wall tumor, placement of epirubicin intravesical  Surgeon: Suzann Lazaro  Anesthesia: Gen. with LMA.  Complications: None  Specimen: Bladder tumor, to pathology.  Drains: 68 French Foley catheter  Indications: 70 year old male recently presented with gross hematuria.  Only urologic abnormality was a papillary tumor, approximately 2 centimeters wide, at the bladder neck, located anteriorly.  CT hematuria protocol was normal.  The patient presents at this point for TURBT of this papillary tumor, as well as, possibly, administration of epirubicin intravesically.  Following the procedure.  The procedure as well as risks and complications have been discussed with the patient and his wife.  These include, but are not limited to infection, bleeding, retention, anesthetic complication, inflammation from the intravesical chemotherapy.  They understand and except these and desire to proceed.  Findings: Urethra was normal.  Prostate was nonobstructive.  Mild trabeculations noted throughout the bladder.  Ureteral orifices were normal.  There was a papillary tumor located just at the bladder neck anteriorly from the 10 o'clock to the 12 o'clock position.  No other bladder lesions were seen.  Description of procedure: The patient was properly identified in the holding area.  He received preoperative Cipro for some time, although he complained of itching and was mild redness in a streak-like pattern above his vein.  This reason, Cipro was stopped.  He was taken to the operating room where general anesthetic was administered with the LMA.  He was placed in the dorsolithotomy position.  Genitalia and perineum were prepped and draped.  Proper timeout was performed.  A 26 French resectoscope sheath was placed with the obturator.  The resectoscope with the  Foroblique lens was then placed.  Inspection was then carried out in the bladder which was normal except for the previously mentioned papillary tumor at the bladder neck.  Using the cutting loop, this was resected from the inferior bladder into the prostatic urethra, from the 10 o'clock to the 2 o'clock position.  In this manner, the papillary tumor, as well as a significant amount of tissue underneath was resected.  Resected.  Chips were sent labeled "bladder tumor".  The cutting loop was then used to cauterize the resected bed.  Adequate hemostasis was achieved.  At this point, there being no other lesions within the bladder to resect, the resectoscope was removed.  I placed a 20 French Foley catheter.  Balloon filled with 10 mL of water.  This was then used to totally draining the bladder and up catheter plug was placed.  The patient was then awakened and taken to the PACU in stable condition.  In the PACU, I administered 50 milligrams of epirubicin and 50 mL of diluent.  This was left indwelling for 1 hour.  The patient tolerated procedure well.

## 2016-11-19 NOTE — Anesthesia Preprocedure Evaluation (Signed)
Anesthesia Evaluation  Patient identified by MRN, date of birth, ID band Patient awake    Reviewed: Allergy & Precautions, NPO status , Patient's Chart, lab work & pertinent test results  Airway Mallampati: II  TM Distance: >3 FB Neck ROM: Full    Dental  (+) Poor Dentition, Chipped,    Pulmonary Current Smoker,    breath sounds clear to auscultation       Cardiovascular negative cardio ROS   Rhythm:Regular Rate:Normal     Neuro/Psych    GI/Hepatic negative GI ROS,   Endo/Other    Renal/GU      Musculoskeletal  (+) Arthritis ,   Abdominal   Peds  Hematology   Anesthesia Other Findings   Reproductive/Obstetrics                             Anesthesia Physical Anesthesia Plan  ASA: II  Anesthesia Plan: General   Post-op Pain Management:    Induction: Intravenous  Airway Management Planned: LMA  Additional Equipment:   Intra-op Plan:   Post-operative Plan: Extubation in OR  Informed Consent: I have reviewed the patients History and Physical, chart, labs and discussed the procedure including the risks, benefits and alternatives for the proposed anesthesia with the patient or authorized representative who has indicated his/her understanding and acceptance.     Plan Discussed with:   Anesthesia Plan Comments:         Anesthesia Quick Evaluation

## 2016-11-19 NOTE — Anesthesia Postprocedure Evaluation (Signed)
Anesthesia Post Note  Patient: Jeffery Ponce  Procedure(s) Performed: Procedure(s) (LRB): TRANSURETHRAL RESECTION OF BLADDER TUMOR (TURBT) WITH EPIRUBICIN INJECTION (N/A)  Patient location during evaluation: PACU Anesthesia Type: MAC Level of consciousness: awake and alert Pain management: satisfactory to patient Vital Signs Assessment: post-procedure vital signs reviewed and stable Respiratory status: spontaneous breathing Cardiovascular status: stable Anesthetic complications: no    Last Vitals:  Vitals:   11/19/16 0922 11/19/16 0930  BP:  (!) 154/83  Pulse: 62 64  Resp: 12 12  Temp:      Last Pain:  Vitals:   11/19/16 0930  PainSc: 6                  Psalm Schappell

## 2016-11-21 ENCOUNTER — Encounter (HOSPITAL_COMMUNITY): Payer: Self-pay | Admitting: Urology

## 2016-11-21 ENCOUNTER — Ambulatory Visit (HOSPITAL_COMMUNITY): Payer: Medicare HMO

## 2016-11-26 MED FILL — Epirubicin HCl IV Soln 50 MG/25ML (2 MG/ML): INTRAVENOUS | Qty: 25 | Status: AC

## 2016-12-10 ENCOUNTER — Encounter (HOSPITAL_COMMUNITY): Payer: Self-pay | Admitting: Oncology

## 2016-12-10 DIAGNOSIS — C689 Malignant neoplasm of urinary organ, unspecified: Secondary | ICD-10-CM

## 2016-12-10 DIAGNOSIS — C679 Malignant neoplasm of bladder, unspecified: Secondary | ICD-10-CM

## 2016-12-10 HISTORY — DX: Malignant neoplasm of urinary organ, unspecified: C68.9

## 2016-12-10 HISTORY — DX: Malignant neoplasm of bladder, unspecified: C67.9

## 2016-12-17 ENCOUNTER — Encounter (HOSPITAL_COMMUNITY): Payer: Self-pay | Admitting: Oncology

## 2016-12-17 NOTE — Assessment & Plan Note (Addendum)
Invasive urothelial carcinoma (pT2N0) with TURBT by [name] on 11/19/2016 and epirubicin intra-bladder treatment BUT with concerns for clinically early T3 disease.  Labs today: CBC diff, CMET.  I personally reviewed and went over laboratory results with the patient.  The results are noted within this dictation.  I personally reviewed and went over radiographic studies with the patient.  The results are noted within this dictation.  CT abd/pelvis from 11/15/2016 is reviewed with the patient at which time there was a left bladder base lesion measuring 3.2 x 2.7 cm concerning for bladder malignancy without other findings suspicious for disease.  I personally reviewed the images with the patient for his education.  No chest imaging performed at this time.  Order placed for PA and lateral plain film of chest.  If equivocal or abnormality is identified on plain films, CT of chest would be indicated.  Treatment options for neoadjuvant systemic chemotherapy for invasive urothelial carcinoma are reviewed via NCCN guidelines:  1. DDMVAC with GSF support x 3-4 cycles  2. Gemcitabine and cisplatin x 4 cycles  Given his performance status and co-morbidities, will pursue option #2 with Gemcitabine/Cisplatin.  We reviewed the risks, benefits, alternatives, and side effects including, but not limited to, peripheral edema, skin rash, alopecia, nausea/vomiting, diarrhea, stomatitis, proteinuria, hematuria, decreased blood count, change in liver enzymes, infection, fever, neurotoxicity, nephrotoxicity, ototoxicity.  Will refer the patient to Gen Surg for port placement.   Refer to Anderson Malta, Nurse Navigator, for chemotherapy teaching for Cisplatin/Gemcitabine x 4 cycles.  He will return for follow-up on Day 8 of cycle 1 for follow-up and tolerance check.  Following neoadjuvant chemotherapy, he will need to see [name] for consideration of cystoprostatectomy.
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
## 2016-12-18 ENCOUNTER — Encounter (HOSPITAL_COMMUNITY): Payer: Medicare HMO | Attending: Oncology | Admitting: Oncology

## 2016-12-18 ENCOUNTER — Encounter (HOSPITAL_COMMUNITY): Payer: Medicare HMO

## 2016-12-18 ENCOUNTER — Encounter (HOSPITAL_COMMUNITY): Payer: Self-pay | Admitting: Oncology

## 2016-12-18 ENCOUNTER — Encounter (HOSPITAL_COMMUNITY): Payer: Self-pay | Admitting: Lab

## 2016-12-18 ENCOUNTER — Ambulatory Visit (HOSPITAL_COMMUNITY)
Admission: RE | Admit: 2016-12-18 | Discharge: 2016-12-18 | Disposition: A | Discharge status: Home / Self Care | Payer: Medicare HMO | Source: Ambulatory Visit | Attending: Oncology | Admitting: Oncology

## 2016-12-18 VITALS — BP 135/68 | HR 90 | Temp 99.3°F | Resp 16 | Ht 70.0 in | Wt 173.5 lb

## 2016-12-18 DIAGNOSIS — R918 Other nonspecific abnormal finding of lung field: Secondary | ICD-10-CM | POA: Diagnosis not present

## 2016-12-18 DIAGNOSIS — G8921 Chronic pain due to trauma: Secondary | ICD-10-CM | POA: Diagnosis not present

## 2016-12-18 DIAGNOSIS — C679 Malignant neoplasm of bladder, unspecified: Secondary | ICD-10-CM | POA: Insufficient documentation

## 2016-12-18 LAB — CBC WITH DIFFERENTIAL/PLATELET
BASOS ABS: 0 10*3/uL (ref 0.0–0.1)
Basophils Relative: 0 %
EOS ABS: 0.2 10*3/uL (ref 0.0–0.7)
Eosinophils Relative: 2 %
HCT: 43.3 % (ref 39.0–52.0)
HEMOGLOBIN: 14.1 g/dL (ref 13.0–17.0)
LYMPHS ABS: 3.1 10*3/uL (ref 0.7–4.0)
Lymphocytes Relative: 26 %
MCH: 30 pg (ref 26.0–34.0)
MCHC: 32.6 g/dL (ref 30.0–36.0)
MCV: 92.1 fL (ref 78.0–100.0)
Monocytes Absolute: 0.9 10*3/uL (ref 0.1–1.0)
Monocytes Relative: 8 %
NEUTROS PCT: 64 %
Neutro Abs: 7.4 10*3/uL (ref 1.7–7.7)
Platelets: 220 10*3/uL (ref 150–400)
RBC: 4.7 MIL/uL (ref 4.22–5.81)
RDW: 13 % (ref 11.5–15.5)
WBC: 11.6 10*3/uL — AB (ref 4.0–10.5)

## 2016-12-18 LAB — COMPREHENSIVE METABOLIC PANEL
ALBUMIN: 3.9 g/dL (ref 3.5–5.0)
ALT: 11 U/L — AB (ref 17–63)
AST: 14 U/L — AB (ref 15–41)
Alkaline Phosphatase: 81 U/L (ref 38–126)
Anion gap: 5 (ref 5–15)
BUN: 19 mg/dL (ref 6–20)
CALCIUM: 8.7 mg/dL — AB (ref 8.9–10.3)
CO2: 25 mmol/L (ref 22–32)
CREATININE: 1.19 mg/dL (ref 0.61–1.24)
Chloride: 107 mmol/L (ref 101–111)
GFR calc Af Amer: >60 mL/min (ref >60)
GFR calc non Af Amer: 60 mL/min — ABNORMAL LOW (ref >60)
GLUCOSE: 96 mg/dL (ref 65–99)
Potassium: 4.3 mmol/L (ref 3.5–5.1)
SODIUM: 137 mmol/L (ref 135–145)
Total Bilirubin: 0.3 mg/dL (ref 0.3–1.2)
Total Protein: 7.3 g/dL (ref 6.5–8.1)

## 2016-12-18 NOTE — Patient Instructions (Addendum)
[place] at [facility] Discharge Instructions  RECOMMENDATIONS MADE BY THE CONSULTANT AND ANY TEST RESULTS WILL BE SENT TO YOUR REFERRING PHYSICIAN.  You saw Dr. Whitney Muse and Kirby Crigler, [name], today. Labs today will be drawn. On your way out, have a chest xray completed in radiology.  We will call you with results. See Amy at checkout for appointments.  Thank you for choosing [place] at [facility] to provide your oncology and hematology care.  To afford each patient quality time with our provider, please arrive at least 15 minutes before your scheduled appointment time.    If you have a lab appointment with the [facility] please come in thru the  Main Entrance and check in at the main information desk  You need to re-schedule your appointment should you arrive 10 or more minutes late.  We strive to give you quality time with our providers, and arriving late affects you and other patients whose appointments are after yours.  Also, if you no show three or more times for appointments you may be dismissed from the clinic at the providers discretion.     Again, thank you for choosing [facility].  Our hope is that these requests will decrease the amount of time that you wait before being seen by our physicians.       _____________________________________________________________  Should you have questions after your visit to [facility], please contact our office at [phone] between the hours of 8:30 a.m. and 4:30 p.m.  Voicemails left after 4:30 p.m. will not be returned until the following business day.  For prescription refill requests, have your pharmacy contact our office.       Resources For Cancer Patients and their Caregivers ? American [employer] Society: Can assist with transportation, wigs, general needs, runs Look Good Feel Better.        352-213-7301 ? Cancer Care: Provides financial assistance,  online support groups, medication/co-pay assistance.  1-800-813-HOPE 860-413-6639) ? [place] Assists [place] cancer patients and their families through emotional , educational and financial support.  303-646-6192 ? [place] DSS Where to apply for food stamps, Medicaid and utility assistance. 408-030-1539 ? [facility]: Transportation to medical appointments. 514 263 3144 ? Social Security Administration: May apply for disability if have a Stage IV cancer. (604)606-0875 (806)591-8247 ? [employer], Disability and Transit Services: Assists with nutrition, care and transit needs. [place] Support Programs: @10RELATIVEDAYS @ > Cancer Support Group  2nd Tuesday of the month 1pm-2pm, Journey Room  > Creative Journey  3rd Tuesday of the month 1130am-1pm, Journey Room  > Look Good Feel Better  1st Wednesday of the month 10am-12 noon, Journey Room (Call [place] to register [phone])

## 2016-12-18 NOTE — Progress Notes (Unsigned)
Referral sent to [facility] for port. Records faxed on 1/10.  appt 1/10 today

## 2016-12-18 NOTE — Progress Notes (Signed)
Met with pt face to face.  Introduced myself and explain a little bit about my role as the patient navigator.  Pt given a card with all my information on it.  Told pt to call if they had any questions or concerns.  Pt verbalized understanding.  Set up for port with Dr Rosana Hoes.  Chemotherapy teaching 12/25/2016 on cisplatin and gemzar

## 2016-12-19 ENCOUNTER — Encounter (HOSPITAL_COMMUNITY): Payer: Self-pay

## 2016-12-19 ENCOUNTER — Encounter (HOSPITAL_COMMUNITY)
Admission: RE | Admit: 2016-12-19 | Discharge: 2016-12-19 | Disposition: A | Discharge status: Home / Self Care | Payer: Medicare HMO | Source: Ambulatory Visit | Attending: Surgery | Admitting: Surgery

## 2016-12-19 ENCOUNTER — Encounter (HOSPITAL_COMMUNITY): Payer: Self-pay | Admitting: Emergency Medicine

## 2016-12-19 ENCOUNTER — Telehealth (HOSPITAL_COMMUNITY): Payer: Self-pay | Admitting: *Deleted

## 2016-12-19 MED ORDER — LIDOCAINE-PRILOCAINE 2.5-2.5 % EX CREA: TOPICAL_CREAM | CUTANEOUS | 2 refills | Status: DC

## 2016-12-19 MED ORDER — PROCHLORPERAZINE MALEATE 10 MG PO TABS: 10.0000 mg | ORAL_TABLET | Freq: Four times a day (QID) | ORAL | 2 refills | Status: DC | PRN

## 2016-12-19 MED ORDER — ONDANSETRON HCL 8 MG PO TABS: 8.0000 mg | ORAL_TABLET | Freq: Three times a day (TID) | ORAL | 2 refills | Status: DC | PRN

## 2016-12-19 NOTE — Patient Instructions (Signed)
[place]   CHEMOTHERAPY INSTRUCTIONS  You have been diagnosed with bladder cancer.  We are going to treat you with 4 cycles of cisplatin and gemzar.  With the cisplatin you will receive 2 hours of pre-fluids and 2 hours of post-fluids after cisplatin.   You will see the doctor regularly throughout treatment.  We monitor your lab work prior to every treatment.  The doctor monitors your response to treatment by the way you are feeling, your blood work, and scans periodically.  You will receive the following premedications prior to receiving chemotherapy: Premeds: Aloxi - high powered nausea/vomiting prevention medication used for chemotherapy patients. Emend - high powered nausea/vomiting prevention medication used for chemotherapy patients. Dexamethasone - steroid - given to reduce the risk of you having an allergic type reaction to the chemotherapy. Dex can cause you to feel energized, nervous/anxious/jittery, make you have trouble sleeping, and/or make you feel hot/flushed in the face/neck and/or look pink/red in the face/neck. These side effects will pass as the Dex wears off. (takes 20 minutes to infuse)   POTENTIAL SIDE EFFECTS OF TREATMENT:  Cisplatin (Generic Name) Other Names: Platinol, platinum  About This Drug Cisplatin is a drug used to treat cancer. This drug is given in the vein (IV).  This drug takes 1 hour to infuse.  Possible Side Effects (More Common) . This drug may affect how your kidneys work. Your kidney function will be checked as needed. . Electrolyte changes. Your blood will be checked for electrolyte changes as needed. . High-frequency hearing loss may occur. You will get IV fluids before and during the Cisplatin infusion to help prevent this. You may also get ringing in the ears. . Bone marrow depression. This is a decrease in the number of white blood cells, red blood cells, and platelets. This may raise your risk of infection,  make you tired and weak (fatigue), and raise your risk of bleeding. . Nausea and throwing up (vomiting). These symptoms may happen within a few hours after your treatment and may last for a few days to a week. Medicines are available to stop or lessen these side effects.  Possible Side Effects (Less Common) . Effects on the nerves are called peripheral neuropathy. You may feel numbness or pain in your hands and feet. It may be hard for you to button your clothes, open jars, or walk as usual. The effect on the nerves may get worse with more doses of the drug. These effects get better in some people after the drug is stopped, but it does not get better in all people. Marland Kitchen Blurred vision or other changes in eyesight. . Soreness of the mouth and throat. You may have red areas, white patches, or sores that hurt. . Hair loss. You may notice your hair getting thin. Some patients lose their hair. Your hair often grows back when treatment is done.  Allergic Reactions Allergic reactions to this drug are rare, but may happen in some patients. Signs of allergic reactions to this drug may be a rash, fever, chills, feeling dizzy, trouble breathing, and/or feeling that your heart is beating in a fast or not normal way.  Treating Side Effects . Drink 6-8 cups of fluids each day unless your doctor has told you to limit your fluid intake due to some other health problem. A cup is 8 ounces of fluid. If you throw up or have loose bowel movements you should drink more fluids so that you do not  become dehydrated (lack water in the body due to losing too much fluid). . If you have numbness and tingling in your hands and feet, be careful when cooking, walking, and handling sharp objects and hot liquids. . Mouth care is very important. Your mouth care should consist of routine, gentle cleaning of your teeth or dentures and rinsing your mouth with a mixture of 1/2 teaspoon of salt in 8 ounces of water or  teaspoon of baking soda  in 8 ounces of water. This should be done at least after each meal and at bedtime. . If you have mouth sores, avoid mouthwash that has alcohol. Also avoid alcohol and smoking because they can bother your mouth and throat. . Talk with your nurse about getting a wig before you lose your hair. Also, call the [facility] at 800-ACS-2345 to find out information about the "Look Good, Feel Better" program close to where you live. It is a free program where women getting chemotherapy can learn about wigs, turbans and scarves as well as makeup techniques and skin and nail care.  Food and Drug Interactions There are no known interactions of Cisplatin with food. This drug may interact with other medicines. Tell your doctor and pharmacist about all the medicines and dietary supplements (vitamins, minerals, herbs and others) that you are taking at this time. The safety and use of dietary supplements and alternative diets are often not known. Using these might affect your cancer or interfere with your treatment. Until more is known, you should not use dietary supplements or alternative diets without your cancer doctor's help.  When to Call the Doctor Call your doctor or nurse right away if you have any of these symptoms: . Rash or itching . Feeling dizzy or lightheaded . Wheezing or trouble breathing . Swelling of the face . Fever of 100.5 F (38 C) or above . Chills . Easy bleeding or bruising . Decreased urine . Weight gain of 5 pounds in one week (fluid retention) . Nausea that stops you from eating or drinking . Throwing up more than 3 times a day Call your doctor or nurse as soon as possible if you have these symptoms: . Numbness, tingling, decreased feeling or weakness in fingers, toes, arms, or legs . Trouble walking or changes in the way you walk, feeling clumsy when buttoning clothes, opening jars, or other routine hand motions . Blurred vision or other changes in eyesight . Changes in  hearing, ringing in the ears . Pain in your mouth or throat that makes it hard to eat or drink . Fatigue that interferes with your daily activities  Sexual Problems and Reproductive Concerns . Infertility warning: Sexual problems and reproduction concerns may occur. In both men and women, this drug may affect your ability to have children. This cannot be determined before your treatment. Speak with your doctor or nurse if you plan to have children. Ask for information on sperm or egg banking. . In men, this drug may interfere with your ability to make sperm, but it should not change your ability to have sexual relations. . In women, menstrual bleeding may become irregular or stop while you are receiving this drug. Do not assume that you cannot become pregnant if you do not have a menstrual period. . Women may experience signs of menopause like vaginal dryness, itching, and pain during sexual relations . Genetic counseling is available for you to talk about the effects of this drug therapy on future pregnancies. Also, a  genetic counselor can look at the possible risk of problems in the unborn baby due to this medicine if an exposure happens during pregnancy. . Pregnancy warning: The drug may have harmful effects on the unborn child, so effective methods of birth control should be used during your cancer treatment. . Breast feeding warning: Women should not breast feed during treatment because this drug could enter the breast milk and badly harm a breast feeding baby.   Gemcitabine (Generic Name) Other Names: Gemzar  About This Drug Gemcitabine is used to treat cancer. This drug is given in the vein (IV).  This drug takes 30 minutes to infuse.    Possible Side Effects (Most Common) . Bone marrow depression. This is a decrease in the number of white blood cells, red blood cells, and platelets. This may raise your risk of infection, make you tired and weak (fatigue), and raise your risk of  bleeding. . Nausea and throwing up (vomiting). These symptoms may happen within a few hours after your treatment and may last up to several days. Medicines are available to stop or lessen these side effects. . Loose bowel movements (diarrhea) that may last for a few days . Fluid retention. You may have swelling of your arms, legs, face, chest or abdomen . Raised, red rash on arms, legs, back, or chest . Flu-like symptoms: fever, headache, muscle and joint aches, and fatigue (low energy, feeling weak) . Changes in your liver function. Your doctor will check your liver function as needed.  Possible Side Effects (Less Common) . Feeling short of breath . Decreased appetite (decreased hunger) . Effects on the nerves are called peripheral neuropathy. You may feel numbness, tingling, or pain in your hands and feet. It may be hard for you to button your clothes, open jars, or walk as usual. The effect on the nerves may get worse with more doses of the drug. These effects get better in some people after the drug is stopped but it does not get better in all people.  Treating Side Effects . Ask your doctor or nurse about medication that is available to help stop or lessen nausea, throwing up, loose bowel movements, headache, muscle and joint aches. . Drink 6-8 cups of fluids each day unless your doctor has told you to limit your fluid intake due to some other health problem. A cup is 8 ounces of fluid. If you throw up or have loose bowel movements you should drink more fluids so that you do not become dehydrated (lack water in the body due to losing too much fluid). . If you get a rash, do not put anything on it unless your doctor or nurse says you may. Keep the area around the rash clean and dry. Ask your doctor for medicine if your rash bothers you. . If you have numbness and tingling in your hands and feet, be careful when cooking, walking, and handling sharp objects and hot liquids.  Food and Drug  Interactions There are no known interactions of gemcitabine with food. This drug may interact with other medicines. Tell your doctor and pharmacist about all the medicines and dietary supplements (vitamins, minerals, herbs and others) that you are taking at this time. The safety and use of dietary supplements and alternative diets are often not known. Using these might affect your cancer or interfere with your treatment. Until more is known, you should not use dietary supplements or alternative diets without your cancer doctor's help.  When to Call the Doctor  Call your doctor or nurse right away if you have any of these symptoms: . Fever of 100.5 F (38 C) or above . Chills . Bleeding or bruising that is not normal . Wheezing or trouble breathing . Rash or itching . Nausea that stops you from eating or drinking . Loose bowel movements (diarrhea) more than 4 times a day or diarrhea with weakness or lightheadedness . Swelling of your arms, legs, face, chest or abdomen (fluid retention) Call your doctor or nurse as soon as possible if any of these symptoms happen: . Numbness, tingling, decreased feeling or weakness in fingers, toes, arms, or legs . Trouble walking or changes in the way you walk, feeling clumsy when buttoning clothes, opening jars, or other routine hand motions . Weight gain of 5 pounds in one week (fluid retention) . Lasting loss of appetite or rapid weight loss of five pounds in a week . Fatigue that interferes with your daily activities  Sexual Problems and Reproduction Concerns . Infertility warning: Sexual problems and reproduction concerns may happen. In both men and women, this drug may affect your ability to have children. This cannot be determined before your treatment. Talk with your doctor or nurse if you plan to have children. Ask for information on sperm or egg banking. . In men, this drug may interfere with your ability to make sperm, but it should not change your  ability to have sexual relations. . In women, menstrual bleeding may become irregular or stop while you are getting this drug. Do not assume that you cannot become pregnant if you do not have a menstrual period. . Women may go through signs of menopause (change of life) like vaginal dryness or itching. Vaginal lubricants can be used to lessen vaginal dryness, itching, and pain during sexual relations. . Genetic counseling is available for you to talk about the effects of this drug therapy on future pregnancies. Also, a genetic counselor can look at the possible risk of problems in the unborn baby due to this medicine if an exposure happens during pregnancy. . Pregnancy warning: This drug may have harmful effects on the unborn child, so effective methods of birth control should be used during your cancer treatment. . Breast feeding warning: It is not known if this drug passes into breast milk. For this reason, women should talk to their doctor about the risks and benefits of breast feeding during treatment with this drug because this drug may enter the breast milk and badly harm a breast feeding baby.  EDUCATIONAL MATERIALS GIVEN AND REVIEWED: Chemotherapy and you book, nutrition book, information on cisplatin and gemzar  SELF CARE ACTIVITIES WHILE ON CHEMOTHERAPY: Hydration Increase your fluid intake 48 hours prior to treatment and drink at least 8 to 12 cups (64 ounces) of water/decaff beverages per day after treatment. You can still have your cup of coffee or soda but these beverages do not count as part of your 8 to 12 cups that you need to drink daily. No alcohol intake.  Medications Continue taking your normal prescription medication as prescribed.  If you start any new herbal or new supplements please let us know first to make sure it is safe.  Mouth Care Have teeth cleaned professionally before starting treatment. Keep dentures and partial plates clean. Use soft toothbrush and do not use  mouthwashes that contain alcohol. Biotene is a good mouthwash that is available at most pharmacies or may be ordered by calling [phone]. Use warm salt water gargles (1  teaspoon salt per 1 quart warm water) before and after meals and at bedtime. Or you may rinse with 2 tablespoons of three-percent hydrogen peroxide mixed in eight ounces of water. If you are still having problems with your mouth or sores in your mouth please call the clinic. If you need dental work, please let Dr. Whitney Muse know before you go for your appointment so that we can coordinate the best possible time for you in regards to your chemo regimen. You need to also let your dentist know that you are actively taking chemo. We may need to do labs prior to your dental appointment.   Skin Care Always use sunscreen that has not expired and with SPF (Sun Protection Factor) of 50 or higher. Wear hats to protect your head from the sun. Remember to use sunscreen on your hands, ears, face, & feet.  Use good moisturizing lotions such as udder cream, eucerin, or even Vaseline. Some chemotherapies can cause dry skin, color changes in your skin and nails.    . Avoid long, hot showers or baths. . Use gentle, fragrance-free soaps and laundry detergent. . Use moisturizers, preferably creams or ointments rather than lotions because the thicker consistency is better at preventing skin dehydration. Apply the cream or ointment within 15 minutes of showering. Reapply moisturizer at night, and moisturize your hands every time after you wash them.  Hair Loss (if your doctor says your hair will fall out)  . If your doctor says that your hair is likely to fall out, decide before you begin chemo whether you want to wear a wig. You may want to shop before treatment to match your hair color. . Hats, turbans, and scarves can also camouflage hair loss, although some people prefer to leave their heads uncovered. If you go bare-headed outdoors, be sure to use  sunscreen on your scalp. . Cut your hair short. It eases the inconvenience of shedding lots of hair, but it also can reduce the emotional impact of watching your hair fall out. . Don't perm or color your hair during chemotherapy. Those chemical treatments are already damaging to hair and can enhance hair loss. Once your chemo treatments are done and your hair has grown back, it's OK to resume dyeing or perming hair. With chemotherapy, hair loss is almost always temporary. But when it grows back, it may be a different color or texture. In older adults who still had hair color before chemotherapy, the new growth may be completely gray.  Often, new hair is very fine and soft.  Infection Prevention Please wash your hands for at least 30 seconds using warm soapy water. Handwashing is the #1 way to prevent the spread of germs. Stay away from sick people or people who are getting over a cold. If you develop respiratory systems such as green/yellow mucus production or productive cough or persistent cough let us know and we will see if you need an antibiotic. It is a good idea to keep a pair of gloves on when going into grocery stores/[employer] to decrease your risk of coming into contact with germs on the carts, etc. Carry alcohol hand gel with you at all times and use it frequently if out in public. If your temperature reaches 100.5 or higher please call the clinic and let us know.  If it is after hours or on the weekend please go to the ER if your temperature is over 100.5.  Please have your own personal thermometer at home to use.  Sex and bodily fluids If you are going to have sex, a condom must be used to protect the person that isn't taking chemotherapy. Chemo can decrease your libido (sex drive). For a few days after chemotherapy, chemotherapy can be excreted through your bodily fluids.  When using the toilet please close the lid and flush the toilet twice.  Do this for a few day after you have had  chemotherapy.     Effects of chemotherapy on your sex life Some changes are simple and won't last long. They won't affect your sex life permanently. Sometimes you may feel: . too tired . not strong enough to be very active . sick or sore  . not in the mood . anxious or low Your anxiety might not seem related to sex. For example, you may be worried about the cancer and how your treatment is going. Or you may be worried about money, or about how you family are coping with your illness. These things can cause stress, which can affect your interest in sex. It's important to talk to your partner about how you feel. Remember - the changes to your sex life don't usually last long. There's usually no medical reason to stop having sex during chemo. The drugs won't have any long term physical effects on your performance or enjoyment of sex. Cancer can't be passed on to your partner during sex  Contraception It's important to use reliable contraception during treatment. Avoid getting pregnant while you or your partner are having chemotherapy. This is because the drugs may harm the baby. Sometimes chemotherapy drugs can leave a man or woman infertile.  This means you would not be able to have children in the future. You might want to talk to someone about permanent infertility. It can be very difficult to learn that you may no longer be able to have children. Some people find counselling helpful. There might be ways to preserve your fertility, although this is easier for men than for women. You may want to speak to a fertility expert. You can talk about sperm banking or harvesting your eggs. You can also ask about other fertility options, such as donor eggs. If you have or have had breast cancer, your doctor might advise you not to take the contraceptive pill. This is because the hormones in it might affect the cancer.  It is not known for sure whether or not chemotherapy drugs can be passed on through semen  or secretions from the vagina. Because of this some doctors advise people to use a barrier method if you have sex during treatment. This applies to vaginal, anal or oral sex. Generally, doctors advise a barrier method only for the time you are actually having the treatment and for about a week after your treatment. Advice like this can be worrying, but this does not mean that you have to avoid being intimate with your partner. You can still have close contact with your partner and continue to enjoy sex.  Animals If you have cats or birds we just ask that you not change the litter or change the cage.  Please have someone else do this for you while you are on chemotherapy.   Food Safety During and After Cancer Treatment Food safety is important for people both during and after cancer treatment. Cancer and cancer treatments, such as chemotherapy, radiation therapy, and stem cell/bone marrow transplantation, often weaken the immune system. This makes it harder for your body to protect itself from foodborne illness,  also called food poisoning. Foodborne illness is caused by eating food that contains harmful bacteria, parasites, or viruses.  Foods to avoid Some foods have a higher risk of becoming tainted with bacteria. These include: Marland Kitchen Unwashed fresh fruit and vegetables, especially leafy vegetables that can hide dirt and other contaminants . Raw sprouts, such as alfalfa sprouts . Raw or undercooked beef, especially ground beef, or other raw or undercooked meat and poultry . Fatty, fried, or spicy foods immediately before or after treatment.  These can sit heavy on your stomach and make you feel nauseous. . Raw or undercooked shellfish, such as oysters. . Sushi and sashimi, which often contain raw fish.  . Unpasteurized beverages, such as unpasteurized fruit juices, raw milk, raw yogurt, or cider . Undercooked eggs, such as soft boiled, over easy, and poached; raw, unpasteurized eggs; or foods made  with raw egg, such as homemade raw cookie dough and homemade mayonnaise Simple steps for food safety Shop smart. . Do not buy food stored or displayed in an unclean area. . Do not buy bruised or damaged fruits or vegetables. . Do not buy cans that have cracks, dents, or bulges. . Pick up foods that can spoil at the end of your shopping trip and store them in a cooler on the way home. Prepare and clean up foods carefully. . Rinse all fresh fruits and vegetables under running water, and dry them with a clean towel or paper towel. . Clean the top of cans before opening them. . After preparing food, wash your hands for 20 seconds with hot water and soap. Pay special attention to areas between fingers and under nails. . Clean your utensils and dishes with hot water and soap. Marland Kitchen Disinfect your kitchen and cutting boards using 1 teaspoon of liquid, unscented bleach mixed into 1 quart of water.   Dispose of old food. . Eat canned and packaged food before its expiration date (the "use by" or "best before" date). . Consume refrigerated leftovers within 3 to 4 days. After that time, throw out the food. Even if the food does not smell or look spoiled, it still may be unsafe. Some bacteria, such as Listeria, can grow even on foods stored in the refrigerator if they are kept for too long. Take precautions when eating out. . At restaurants, avoid buffets and salad bars where food sits out for a long time and comes in contact with many people. Food can become contaminated when someone with a virus, often a norovirus, or another "bug" handles it. . Put any leftover food in a "to-go" container yourself, rather than having the server do it. And, refrigerate leftovers as soon as you get home. . Choose restaurants that are clean and that are willing to prepare your food as you order it cooked.    MEDICATIONS:                                                                                                                Zofran/Ondansetron 8mg  tablet. Take 1  tablet every 8 hours as needed for nausea/vomiting. (#1 nausea med to take, this can constipate)  Compazine/Prochlorperazine 10mg  tablet. Take 1 tablet every 6 hours as needed for nausea/vomiting. (#2 nausea med to take, this can make you sleepy)   EMLA cream. Apply a quarter size amount to port site 1 hour prior to chemo. Do not rub in. Cover with plastic wrap.   Over-the-Counter Meds:  Miralax 1 capful in 8 oz of fluid daily. May increase to two times a day if needed. This is a stool softener. If this doesn't work proceed you can add:  Senokot S-start with 1 tablet two times a day and increase to 4 tablets two times a day if needed. (total of 8 tablets in a 24 hour period). This is a stimulant laxative.   Call us if this does not help your bowels move.   Imodium 2mg  capsule. Take 2 capsules after the 1st loose stool and then 1 capsule every 2 hours until you go a total of 12 hours without having a loose stool. Call the [facility] if loose stools continue. If diarrhea occurs @ bedtime, take 2 capsules @ bedtime. Then take 2 capsules every 4 hours until morning. Call [facility].    Diarrhea Sheet  If you are having loose stools/diarrhea, please purchase Imodium and begin taking as outlined:  At the first sign of poorly formed or loose stools you should begin taking Imodium(loperamide) 2 mg capsules.  Take two caplets (4mg ) followed by one caplet (2mg ) every 2 hours until you have had no diarrhea for 12 hours.  During the night take two caplets (4mg ) at bedtime and continue every 4 hours during the night until the morning.  Stop taking Imodium only after there is no sign of diarrhea for 12 hours.    Always call the [facility] if you are having loose stools/diarrhea that you can't get under control.  Loose stools/disrrhea leads to dehydration (loss of water) in your body.  We have other options of trying to get the loose stools/diarrhea to  stopped but you must let us know!    Constipation Sheet *Miralax in 8 oz of fluid daily.  May increase to two times a day if needed.  This is a stool softener.  If this not enough to keep your bowel regular:  You can add:  *Senokot S, start with one tablet twice a day and can increase to 4 tablets twice a day if needed.  This is a stimulant laxative.   Sometimes when you take pain medication you need BOTH a medicine to keep your stool soft and a medicine to help your bowel push it out!  Please call if the above does not work for you.   Do not go more than 2 days without a bowel movement.  It is very important that you do not become constipated.  It will make you feel sick to your stomach (nausea) and can cause abdominal pain and vomiting.    Nausea Sheet  Zofran/Ondansetron 8mg  tablet. Take 1 tablet every 8 hours as needed for nausea/vomiting. (#1 nausea med to take, this can constipate)  Compazine/Prochlorperazine 10mg  tablet. Take 1 tablet every 6 hours as needed for nausea/vomiting. (#2 nausea med to take, this can make you sleepy)  You can take these medications together or separately.  We would first like for you to try the Ondansetron by itself and then take the Prochloperizine if needed. But you are allowed to take both medications at  the same time if your nausea is that severe.  If you are having persistent nausea (nausea that does not stop) please take these medications on a staggered schedule so that the nausea medication stays in your body.  Please call the [facility] and let us know the amount of nausea that you are experiencing.  If you begin to vomit, you need to call the [facility] and if it is the weekend and you have vomited more than one time and cant get it to stop-go to the Emergency Room.  Persistent nausea/vomiting can lead to dehydration (loss of fluid in your body) and will make you feel terrible.   Ice chips, sips of clear liquids, foods that are @ room  temperature, crackers, and toast tend to be better tolerated.    SYMPTOMS TO REPORT AS SOON AS POSSIBLE AFTER TREATMENT:  FEVER GREATER THAN 100.5 F  CHILLS WITH OR WITHOUT FEVER  NAUSEA AND VOMITING THAT IS NOT CONTROLLED WITH YOUR NAUSEA MEDICATION  UNUSUAL SHORTNESS OF BREATH  UNUSUAL BRUISING OR BLEEDING  TENDERNESS IN MOUTH AND THROAT WITH OR WITHOUT PRESENCE OF ULCERS  URINARY PROBLEMS  BOWEL PROBLEMS  UNUSUAL RASH    Wear comfortable clothing and clothing appropriate for easy access to any Portacath or PICC line. Let us know if there is anything that we can do to make your therapy better!    What to do if you need assistance after hours or on the weekends: CALL [phone].  HOLD on the line, do not hang up.  You will hear multiple messages but at the end you will be connected with a nurse triage line.  They will contact Dr Whitney Muse if necessary.  Most of the time they will be able to assist you.   Do not call the hospital operator.  Dr Whitney Muse will not answer phone calls received by them.     I have been informed and understand all of the instructions given to me and have received a copy. I have been instructed to call the clinic [phone] or my family physician as soon as possible for continued medical care, if indicated. I do not have any more questions at this time but understand that I may call the [facility] or the Patient Navigator at [phone] during office hours should I have questions or need assistance in obtaining follow-up care.

## 2016-12-19 NOTE — Progress Notes (Signed)
Chemotherapy teaching pulled together and appts made. 

## 2016-12-20 NOTE — Progress Notes (Signed)
START ON PATHWAY REGIMEN - Bladder  [id number]: Gemcitabine 1,000 mg/m2 D1, 8 + Cisplatin 70 mg/m2 D1 q21 Days x 4 Cycles   A cycle is every 21 days:     Gemcitabine (Gemzar(R)) 1000 mg/m2 in 250 mL NS IV over 30 minutes days 1 and 8. Dose Mod: None     Cisplatin (Platinol(R)) 70 mg/m2 in 500 mL NS IV. *Prehydrate and consider post-hydration.*  Consider RX for 3 days of bid oral dexamethasone for delayed nausea. Dose Mod: None  **Always confirm dose/schedule in your pharmacy ordering system**    Patient Characteristics: Early Stage Disease, Pre Cystectomy, Clinical T2-T4a, N0-1, M0, Cystectomy Eligible, Cisplatin-Based Chemotherapy Indicated (CrCl >= 50 mL/min and Minimal or No Symptoms) AJCC Stage Grouping: III Current evidence of distant metastases? No AJCC T Stage: 3a AJCC M Stage: 0 AJCC N Stage: 0 Has patient had a cystectomy? No  Intent of Therapy: Curative Intent, Discussed with Patient

## 2016-12-22 ENCOUNTER — Encounter (HOSPITAL_COMMUNITY): Payer: Self-pay | Admitting: Oncology

## 2016-12-23 ENCOUNTER — Encounter (HOSPITAL_COMMUNITY)
Admission: RE | Disposition: A | Discharge status: Home / Self Care | Payer: Self-pay | Source: Ambulatory Visit | Attending: Surgery

## 2016-12-23 ENCOUNTER — Ambulatory Visit (HOSPITAL_COMMUNITY): Payer: Medicare HMO

## 2016-12-23 ENCOUNTER — Ambulatory Visit (HOSPITAL_COMMUNITY)
Admission: RE | Admit: 2016-12-23 | Discharge: 2016-12-23 | Disposition: A | Discharge status: Home / Self Care | Payer: Medicare HMO | Source: Ambulatory Visit | Attending: Surgery | Admitting: Surgery

## 2016-12-23 ENCOUNTER — Ambulatory Visit (HOSPITAL_COMMUNITY): Payer: Medicare HMO | Admitting: Anesthesiology

## 2016-12-23 ENCOUNTER — Encounter (HOSPITAL_COMMUNITY): Payer: Self-pay

## 2016-12-23 DIAGNOSIS — Z79899 Other long term (current) drug therapy: Secondary | ICD-10-CM | POA: Diagnosis not present

## 2016-12-23 DIAGNOSIS — E785 Hyperlipidemia, unspecified: Secondary | ICD-10-CM | POA: Diagnosis not present

## 2016-12-23 DIAGNOSIS — R102 Pelvic and perineal pain: Secondary | ICD-10-CM | POA: Diagnosis not present

## 2016-12-23 DIAGNOSIS — F17210 Nicotine dependence, cigarettes, uncomplicated: Secondary | ICD-10-CM | POA: Insufficient documentation

## 2016-12-23 DIAGNOSIS — Z79891 Long term (current) use of opiate analgesic: Secondary | ICD-10-CM | POA: Insufficient documentation

## 2016-12-23 DIAGNOSIS — Z823 Family history of stroke: Secondary | ICD-10-CM | POA: Diagnosis not present

## 2016-12-23 DIAGNOSIS — G8929 Other chronic pain: Secondary | ICD-10-CM | POA: Diagnosis not present

## 2016-12-23 DIAGNOSIS — Z87892 Personal history of anaphylaxis: Secondary | ICD-10-CM | POA: Diagnosis not present

## 2016-12-23 DIAGNOSIS — Z452 Encounter for adjustment and management of vascular access device: Secondary | ICD-10-CM | POA: Insufficient documentation

## 2016-12-23 DIAGNOSIS — C679 Malignant neoplasm of bladder, unspecified: Secondary | ICD-10-CM | POA: Insufficient documentation

## 2016-12-23 DIAGNOSIS — Z789 Other specified health status: Secondary | ICD-10-CM

## 2016-12-23 DIAGNOSIS — Z880 Allergy status to penicillin: Secondary | ICD-10-CM | POA: Insufficient documentation

## 2016-12-23 DIAGNOSIS — Z95828 Presence of other vascular implants and grafts: Secondary | ICD-10-CM

## 2016-12-23 HISTORY — PX: PORTACATH PLACEMENT: SHX2246

## 2016-12-23 SURGERY — INSERTION, TUNNELED CENTRAL VENOUS DEVICE, WITH PORT
Anesthesia: Monitor Anesthesia Care

## 2016-12-23 MED ORDER — SODIUM CHLORIDE 0.9 % IV SOLN
INTRAVENOUS | Status: DC | PRN
  Administered 2016-12-23: 500 mL via INTRAMUSCULAR

## 2016-12-23 MED ORDER — EPHEDRINE SULFATE 50 MG/ML IJ SOLN
INTRAMUSCULAR | Status: AC
  Filled 2016-12-23: qty 1

## 2016-12-23 MED ORDER — PROPOFOL 500 MG/50ML IV EMUL
INTRAVENOUS | Status: DC | PRN
  Administered 2016-12-23: 25 ug/kg/min via INTRAVENOUS

## 2016-12-23 MED ORDER — ONDANSETRON HCL 4 MG/2ML IJ SOLN
4.0000 mg | Freq: Once | INTRAMUSCULAR | Status: AC
  Administered 2016-12-23: 4 mg via INTRAVENOUS

## 2016-12-23 MED ORDER — HEPARIN SOD (PORK) LOCK FLUSH 100 UNIT/ML IV SOLN
INTRAVENOUS | Status: AC
  Filled 2016-12-23: qty 5

## 2016-12-23 MED ORDER — FENTANYL CITRATE (PF) 100 MCG/2ML IJ SOLN
25.0000 ug | INTRAMUSCULAR | Status: AC | PRN
  Administered 2016-12-23 (×2): 25 ug via INTRAVENOUS

## 2016-12-23 MED ORDER — MIDAZOLAM HCL 2 MG/2ML IJ SOLN
INTRAMUSCULAR | Status: AC
  Filled 2016-12-23: qty 2

## 2016-12-23 MED ORDER — SUCCINYLCHOLINE CHLORIDE 20 MG/ML IJ SOLN
INTRAMUSCULAR | Status: AC
  Filled 2016-12-23: qty 1

## 2016-12-23 MED ORDER — LIDOCAINE HCL (PF) 1 % IJ SOLN
INTRAMUSCULAR | Status: AC
  Filled 2016-12-23: qty 30

## 2016-12-23 MED ORDER — LIDOCAINE HCL 1 % IJ SOLN
INTRAMUSCULAR | Status: DC | PRN
  Administered 2016-12-23: 15 mL via INTRAMUSCULAR

## 2016-12-23 MED ORDER — SODIUM CHLORIDE 0.9 % IJ SOLN
INTRAMUSCULAR | Status: AC
  Filled 2016-12-23: qty 10

## 2016-12-23 MED ORDER — LACTATED RINGERS IV SOLN
INTRAVENOUS | Status: DC
  Administered 2016-12-23: 1000 mL via INTRAVENOUS

## 2016-12-23 MED ORDER — MIDAZOLAM HCL 5 MG/5ML IJ SOLN
INTRAMUSCULAR | Status: DC | PRN
  Administered 2016-12-23 (×2): 1 mg via INTRAVENOUS

## 2016-12-23 MED ORDER — PHENYLEPHRINE 40 MCG/ML (10ML) SYRINGE FOR IV PUSH (FOR BLOOD PRESSURE SUPPORT)
PREFILLED_SYRINGE | INTRAVENOUS | Status: AC
  Filled 2016-12-23: qty 10

## 2016-12-23 MED ORDER — ARTIFICIAL TEARS OP OINT
TOPICAL_OINTMENT | OPHTHALMIC | Status: AC
  Filled 2016-12-23: qty 3.5

## 2016-12-23 MED ORDER — VANCOMYCIN HCL IN DEXTROSE 1-5 GM/200ML-% IV SOLN
1000.0000 mg | INTRAVENOUS | Status: AC
  Administered 2016-12-23: 1000 mg via INTRAVENOUS
  Filled 2016-12-23: qty 200

## 2016-12-23 MED ORDER — FENTANYL CITRATE (PF) 100 MCG/2ML IJ SOLN
INTRAMUSCULAR | Status: AC
  Filled 2016-12-23: qty 2

## 2016-12-23 MED ORDER — BUPIVACAINE HCL (PF) 0.5 % IJ SOLN
INTRAMUSCULAR | Status: AC
  Filled 2016-12-23: qty 30

## 2016-12-23 MED ORDER — DEXTROSE 5 % IV SOLN
INTRAVENOUS | Status: DC | PRN
  Administered 2016-12-23: 10:00:00 via INTRAVENOUS

## 2016-12-23 MED ORDER — CHLORHEXIDINE GLUCONATE CLOTH 2 % EX PADS: 6.0000 | MEDICATED_PAD | Freq: Once | CUTANEOUS | Status: DC

## 2016-12-23 MED ORDER — MIDAZOLAM HCL 2 MG/2ML IJ SOLN
0.5000 mg | INTRAMUSCULAR | Status: DC | PRN
  Administered 2016-12-23: 2 mg via INTRAVENOUS

## 2016-12-23 MED ORDER — PROPOFOL 10 MG/ML IV BOLUS
INTRAVENOUS | Status: AC
  Filled 2016-12-23: qty 20

## 2016-12-23 MED ORDER — HEPARIN SOD (PORK) LOCK FLUSH 100 UNIT/ML IV SOLN
INTRAVENOUS | Status: DC | PRN
  Administered 2016-12-23: 400 [IU] via INTRAVENOUS

## 2016-12-23 MED ORDER — ONDANSETRON HCL 4 MG/2ML IJ SOLN
INTRAMUSCULAR | Status: AC
  Filled 2016-12-23: qty 2

## 2016-12-23 SURGICAL SUPPLY — 35 items
ADH SKN CLS APL DERMABOND .7 (GAUZE/BANDAGES/DRESSINGS) ×2
BAG DECANTER FOR FLEXI CONT (MISCELLANEOUS) ×2 IMPLANT
BAG HAMPER (MISCELLANEOUS) ×2 IMPLANT
BLADE SURG SZ11 CARB STEEL (BLADE) ×2 IMPLANT
CHLORAPREP W/TINT 10.5 ML (MISCELLANEOUS) ×4 IMPLANT
CLOTH BEACON ORANGE TIMEOUT ST (SAFETY) ×2 IMPLANT
COVER LIGHT HANDLE STERIS (MISCELLANEOUS) ×4 IMPLANT
DECANTER SPIKE VIAL GLASS SM (MISCELLANEOUS) ×4 IMPLANT
DERMABOND ADVANCED (GAUZE/BANDAGES/DRESSINGS) ×2
DERMABOND ADVANCED .7 DNX12 (GAUZE/BANDAGES/DRESSINGS) ×1 IMPLANT
DRAPE C-ARM FOLDED MOBILE STRL (DRAPES) ×2 IMPLANT
ELECT REM PT RETURN 9FT ADLT (ELECTROSURGICAL) ×2
ELECTRODE REM PT RTRN 9FT ADLT (ELECTROSURGICAL) ×1 IMPLANT
GLOVE BIOGEL M 7.0 STRL (GLOVE) ×1 IMPLANT
GLOVE BIOGEL PI IND STRL 7.0 (GLOVE) ×1 IMPLANT
GLOVE BIOGEL PI IND STRL 7.5 (GLOVE) ×1 IMPLANT
GLOVE BIOGEL PI INDICATOR 7.0 (GLOVE) ×2
GLOVE BIOGEL PI INDICATOR 7.5 (GLOVE) ×1
GLOVE ECLIPSE 7.0 STRL STRAW (GLOVE) ×2 IMPLANT
GOWN STRL REUS W/TWL LRG LVL3 (GOWN DISPOSABLE) ×4 IMPLANT
IV NS 500ML (IV SOLUTION) ×2
IV NS 500ML BAXH (IV SOLUTION) ×1 IMPLANT
KIT PORT POWER 8FR ISP MRI (Port) ×2 IMPLANT
KIT ROOM TURNOVER APOR (KITS) ×2 IMPLANT
MANIFOLD NEPTUNE II (INSTRUMENTS) ×2 IMPLANT
NDL HYPO 25X1 1.5 SAFETY (NEEDLE) ×1 IMPLANT
NEEDLE HYPO 25X1 1.5 SAFETY (NEEDLE) ×2 IMPLANT
PACK MINOR (CUSTOM PROCEDURE TRAY) ×2 IMPLANT
PAD ARMBOARD 7.5X6 YLW CONV (MISCELLANEOUS) ×2 IMPLANT
SET BASIN LINEN APH (SET/KITS/TRAYS/PACK) ×2 IMPLANT
SUT MNCRL AB 4-0 PS2 18 (SUTURE) ×1 IMPLANT
SUT VIC AB 3-0 SH 27 (SUTURE) ×2
SUT VIC AB 3-0 SH 27X BRD (SUTURE) ×1 IMPLANT
SYR 20CC LL (SYRINGE) ×2 IMPLANT
SYR CONTROL 10ML LL (SYRINGE) ×2 IMPLANT

## 2016-12-23 NOTE — Anesthesia Procedure Notes (Signed)
Procedure Name: MAC Date/Time: 12/23/2016 10:18 AM Performed by: Andree Elk, AMY A Pre-anesthesia Checklist: Patient identified, Timeout performed, Emergency Drugs available, Suction available and Patient being monitored Oxygen Delivery Method: Simple face mask

## 2016-12-23 NOTE — Anesthesia Postprocedure Evaluation (Signed)
Anesthesia Post Note  Patient: Jeffery Ponce  Procedure(s) Performed: Procedure(s) (LRB): PLACEMENT OF TUNNELED CENTRAL VENOUS CATHETER RIGHT INTERNAL JUGULAR WITH SUBCUTANEOUS PORT (N/A)  Patient location during evaluation: PACU Anesthesia Type: MAC Level of consciousness: awake and alert and oriented Pain management: pain level controlled Vital Signs Assessment: post-procedure vital signs reviewed and stable Respiratory status: spontaneous breathing Cardiovascular status: stable Postop Assessment: no signs of nausea or vomiting Anesthetic complications: no     Last Vitals:  Vitals:   12/23/16 1010 12/23/16 1015  BP: (!) 113/59 (!) 99/58  Pulse:    Resp: 18 (!) 40  Temp:      Last Pain:  Vitals:   12/23/16 0747  TempSrc: Oral  PainSc: 5                  ADAMS, AMY A

## 2016-12-23 NOTE — Transfer of Care (Signed)
Immediate Anesthesia Transfer of Care Note  Patient: Jeffery Ponce  Procedure(s) Performed: Procedure(s): PLACEMENT OF TUNNELED CENTRAL VENOUS CATHETER RIGHT INTERNAL JUGULAR WITH SUBCUTANEOUS PORT (N/A)  Patient Location: PACU  Anesthesia Type:MAC  Level of Consciousness: awake, alert , oriented and patient cooperative  Airway & Oxygen Therapy: Patient Spontanous Breathing and Patient connected to nasal cannula oxygen  Post-op Assessment: Report given to RN and Post -op Vital signs reviewed and stable  Post vital signs: Reviewed and stable  Last Vitals:  Vitals:   12/23/16 1010 12/23/16 1015  BP: (!) 113/59 (!) 99/58  Pulse:    Resp: 18 (!) 40  Temp:      Last Pain:  Vitals:   12/23/16 0747  TempSrc: Oral  PainSc: 5          Complications: No apparent anesthesia complications

## 2016-12-23 NOTE — Interval H&P Note (Signed)
History and Physical Interval Note:  12/23/2016 7:34 AM  Jeffery Ponce  has presented today for surgery, with the diagnosis of bladder cancer  The various methods of treatment have been discussed with the patient and family. After consideration of risks, benefits and other options for treatment, the patient has consented to  Procedure(s): INSERTION PORT-A-CATH (N/A) as a surgical intervention .  The patient's history has been reviewed, patient examined, no change in status, stable for surgery.  I have reviewed the patient's chart and labs.  Questions were answered to the patient's satisfaction.     Vickie Epley

## 2016-12-23 NOTE — Anesthesia Preprocedure Evaluation (Signed)
Anesthesia Evaluation  Patient identified by MRN, date of birth, ID band Patient awake    Reviewed: Allergy & Precautions, NPO status , Patient's Chart, lab work & pertinent test results  Airway Mallampati: II  TM Distance: >3 FB Neck ROM: Full    Dental  (+) Poor Dentition, Chipped,    Pulmonary Current Smoker,    breath sounds clear to auscultation       Cardiovascular negative cardio ROS   Rhythm:Regular Rate:Normal     Neuro/Psych    GI/Hepatic negative GI ROS,   Endo/Other    Renal/GU      Musculoskeletal  (+) Arthritis ,   Abdominal   Peds  Hematology   Anesthesia Other Findings   Reproductive/Obstetrics                             Anesthesia Physical Anesthesia Plan  ASA: II  Anesthesia Plan: MAC   Post-op Pain Management:    Induction: Intravenous  Airway Management Planned: Simple Face Mask  Additional Equipment:   Intra-op Plan:   Post-operative Plan:   Informed Consent: I have reviewed the patients History and Physical, chart, labs and discussed the procedure including the risks, benefits and alternatives for the proposed anesthesia with the patient or authorized representative who has indicated his/her understanding and acceptance.     Plan Discussed with:   Anesthesia Plan Comments:         Anesthesia Quick Evaluation

## 2016-12-23 NOTE — Discharge Instructions (Signed)
In addition to included general post-operative instructions for Placement of Central Venous Catheter with Subcutaneous Port for Chemotherapy,  Diet: Resume home heart healthy diet.   Activity: No heavy lifting >20 pounds (children, pets, laundry, garbage) or strenuous activity until follow-up, but light activity and walking are encouraged. Do not drive or drink alcohol if taking narcotic pain medications.  Wound care: 2 days after surgery (Wednesday, 1/17), may shower/get incision wet with soapy water and pat dry (do not rub incisions), but no baths or submerging incision underwater until follow-up.   Medications: Resume all home medications. For mild to moderate pain: acetaminophen (Tylenol) or ibuprofen (if no kidney disease). Narcotic pain medications, if prescribed, can be used for severe pain, though may cause nausea, constipation, and drowsiness. Do not combine Tylenol and Percocet within a 6 hour period as Percocet contains Tylenol. If you do not need the narcotic pain medication, you do not need to fill the prescription.  Call office [phone]) at any time if any questions, worsening pain, fevers/chills, bleeding, drainage from incision site, or other concerns.
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
## 2016-12-23 NOTE — Op Note (Signed)
SURGICAL PROCEDURE REPORT  DATE OF PROCEDURE: 12/23/2016   ATTENDING SURGEON: Corene Cornea E. Rosana Hoes, MD   ANESTHESIA: Local with light IV sedation   PRE-OPERATIVE DIAGNOSIS: Stage 3 urothelial bladder carcinoma requiring durable central venous access for chemotherapy (ICD-10's: C67.9)  POST-OPERATIVE DIAGNOSIS: Stage 3 urothelial bladder carcinoma requiring durable central venous access for chemotherapy (ICD-10's: C67.9)  PROCEDURE(S): (cpt: 36561) 1.) Percutaneous access of Right internal jugular vein under ultrasound guidance  2.) Insertion of tunneled Right internal jugular Bard PowerPort central venous catheter with subcutaneous port  INTRAOPERATIVE FINDINGS: Patent easily compressible Right internal jugular vein with appropriate respiratory variations and well-secured tunneled central venous catheter with subcutaneous port at completion of the procedure  INTRAOPERATIVE FLUIDS: 300 mL crystalloid, 0 mL contrast used   FLUOROSCOPY: <1 second, 0.07 mGy   ESTIMATED BLOOD LOSS: Minimal (<20 mL)   SPECIMENS: None   IMPLANTS: 63F tunneled Bard PowerPort central venous catheter with subcutaneous port  DRAINS: None   COMPLICATIONS: None apparent   CONDITION AT COMPLETION: Hemodynamically stable, awake   DISPOSITION: PACU   INDICATION(S) FOR PROCEDURE:  Patient is a 71 y.o. male who presented with Stage 3 urothelial bladder carcinoma requiring durable central venous access for chemotherapy. All risks, benefits, and alternatives to above elective procedures were discussed with the patient, who elected to proceed, and informed consent was accordingly obtained at that time.  DETAILS OF PROCEDURE:  Patient was brought to the operative suite and appropriately identified. In Trendelenburg position, Right IJ venous access site was prepped and draped in the usual sterile fashion, and following a brief timeout, limited duplex evaluation of the Right internal jugular vein was performed.  Percutaneous Right IJ venous access was obtained under ultrasound guidance using Seldinger technique, by which local anesthetic was injected over the Right IJ vein, and access needle was inserted under direct ultrasound visualization into the Right IJ vein, through which soft guidewire was advanced, over which access needle was withdrawn. Guidewire was secured, attention was directed to injection of local anesthetic along the planned tunnel site, 2-3 cm transverse Right chest incision was made and confirmed to accommodate the subcutaneous port, and flushed catheter was tunneled retrograde from the port site over the Right chest to the Right IJ access site with the attached port well-secured to the catheter and within the subcutaneous pocket. Insertion sheath was advanced over the guidewire, which was withdrawn along with the insertion sheath dilator. Length of catheter needed to position the catheter tip at the atrio-caval junction was then measured under direct fluoroscopic visualization, after which the catheter was cut to the measured length and advanced through the sheath into the Right internal jugular vein and SVC without evidence of cardiac arrhythmias during the procedure. Port was confirmed to withdraw blood and flush easily, after which concentrated heparin was instilled into the port and catheter. Dermis at the subcutaneous pocket was re-approximated using buried interrupted 3-0 Vicryl suture, and 4-0 Vicryl suture was used to re-approximate skin at the insertion and subcutaneous port sites in running subcuticular fashion for the subcutaneous port and buried interrupted fashion for the insertion site. Skin was cleaned, dried, and sterile skin glue was applied. Patient was then safely transferred to PACU for a chest x-ray.  I was present for all aspects of the procedures, and there were no intraprocedural complications apparent.

## 2016-12-24 ENCOUNTER — Other Ambulatory Visit (HOSPITAL_COMMUNITY): Payer: Self-pay | Admitting: Hematology & Oncology

## 2016-12-24 ENCOUNTER — Ambulatory Visit: Payer: Medicare HMO | Admitting: Urology

## 2016-12-25 ENCOUNTER — Encounter (HOSPITAL_COMMUNITY): Payer: Self-pay | Admitting: Surgery

## 2016-12-25 ENCOUNTER — Ambulatory Visit (HOSPITAL_COMMUNITY): Payer: Medicare HMO

## 2016-12-25 NOTE — Patient Instructions (Signed)
[facility]   CHEMOTHERAPY INSTRUCTIONS  You have bladder cancer.  We are going to treat you with cisplatin and gemzar days 1,8,15 every 28 days.  This means you will take chemo for 3 weeks in a row with one week off.  This treatment is with curative intent.   You will see the doctor regularly throughout treatment.  We monitor your lab work prior to every treatment.  The doctor monitors your response to treatment by the way you are feeling, your blood work, and scans periodically.   You will have pre-medications prior to receiving chemotherapy: Premeds: Aloxi - high powered nausea/vomiting prevention medication used for chemotherapy patients. Emend - high powered nausea/vomiting prevention medication used for chemotherapy patients. Dexamethasone - steroid - given to reduce the risk of you having an allergic type reaction to the chemotherapy. Dex can cause you to feel energized, nervous/anxious/jittery, make you have trouble sleeping, and/or make you feel hot/flushed in the face/neck and/or look pink/red in the face/neck. These side effects will pass as the Dex wears off. (takes 20 minutes to infuse)   POTENTIAL SIDE EFFECTS OF TREATMENT:  Cisplatin (Generic Name) Other Names: Platinol, platinum  About This Drug Cisplatin is a drug used to treat cancer. This drug is given in the vein (IV).  Possible Side Effects (More Common) . This drug may affect how your kidneys work. Your kidney function will be checked as needed. . Electrolyte changes. Your blood will be checked for electrolyte changes as needed. . High-frequency hearing loss may occur. You will get IV fluids before and during the Cisplatin infusion to help prevent this. You may also get ringing in the ears. . Bone marrow depression. This is a decrease in the number of white blood cells, red blood cells, and platelets. This may raise your risk of infection, make you tired and weak (fatigue), and raise  your risk of bleeding. . Nausea and throwing up (vomiting). These symptoms may happen within a few hours after your treatment and may last for a few days to a week. Medicines are available to stop or lessen these side effects.  Possible Side Effects (Less Common) . Effects on the nerves are called peripheral neuropathy. You may feel numbness or pain in your hands and feet. It may be hard for you to button your clothes, open jars, or walk as usual. The effect on the nerves may get worse with more doses of the drug. These effects get better in some people after the drug is stopped, but it does not get better in all people. Marland Kitchen Blurred vision or other changes in eyesight. . Soreness of the mouth and throat. You may have red areas, white patches, or sores that hurt. . Hair loss. You may notice your hair getting thin. Some patients lose their hair. Your hair often grows back when treatment is done.  Allergic Reactions Allergic reactions to this drug are rare, but may happen in some patients. Signs of allergic reactions to this drug may be a rash, fever, chills, feeling dizzy, trouble breathing, and/or feeling that your heart is beating in a fast or not normal way.  Treating Side Effects . Drink 6-8 cups of fluids each day unless your doctor has told you to limit your fluid intake due to some other health problem. A cup is 8 ounces of fluid. If you throw up or have loose bowel movements you should drink more fluids so that you do not become dehydrated (lack water  in the body due to losing too much fluid). . If you have numbness and tingling in your hands and feet, be careful when cooking, walking, and handling sharp objects and hot liquids. . Mouth care is very important. Your mouth care should consist of routine, gentle cleaning of your teeth or dentures and rinsing your mouth with a mixture of 1/2 teaspoon of salt in 8 ounces of water or  teaspoon of baking soda in 8 ounces of water. This should be done at  least after each meal and at bedtime. . If you have mouth sores, avoid mouthwash that has alcohol. Also avoid alcohol and smoking because they can bother your mouth and throat. . Talk with your nurse about getting a wig before you lose your hair. Also, call the [employer] at 800-ACS-2345 to find out information about the "Look Good, Feel Better" program close to where you live. It is a free program where women getting chemotherapy can learn about wigs, turbans and scarves as well as makeup techniques and skin and nail care.  Food and Drug Interactions There are no known interactions of Cisplatin with food. This drug may interact with other medicines. Tell your doctor and pharmacist about all the medicines and dietary supplements (vitamins, minerals, herbs and others) that you are taking at this time. The safety and use of dietary supplements and alternative diets are often not known. Using these might affect your cancer or interfere with your treatment. Until more is known, you should not use dietary supplements or alternative diets without your cancer doctor's help.  When to Call the Doctor Call your doctor or nurseright awayif you have any of these symptoms: . Rash or itching . Feeling dizzy or lightheaded . Wheezing or trouble breathing . Swelling of the face . Fever of 100.5 F (38 C) or above . Chills . Easy bleeding or bruising . Decreased urine . Weight gain of 5 pounds in one week (fluid retention) . Nausea that stops you from eating or drinking . Throwing up more than 3 times a day Call your doctor or nurse as soon as possible if you have these symptoms: . Numbness, tingling, decreased feeling or weakness in fingers, toes, arms, or legs . Trouble walking or changes in the way you walk, feeling clumsy when buttoning clothes, opening jars, or other routine hand motions . Blurred vision or other changes in eyesight . Changes in hearing, ringing in the ears . Pain in your  mouth or throat that makes it hard to eat or drink . Fatigue that interferes with your daily activities  Sexual Problems and Reproductive Concerns . Infertility warning: Sexual problems and reproduction concerns may occur. In both men and women, this drug may affect your ability to have children. This cannot be determined before your treatment. Speak with your doctor or nurse if you plan to have children. Ask for information on sperm or egg banking. . In men, this drug may interfere with your ability to make sperm, but it should not change your ability to have sexual relations. . In women, menstrual bleeding may become irregular or stop while you are receiving this drug. Do not assume that you cannot become pregnant if you do not have a menstrual period. . Women may experience signs of menopause like vaginal dryness, itching, and pain during sexual relations . Genetic counseling is available for you to talk about the effects of this drug therapy on future pregnancies. Also, a Dietitian can look at the  possible risk of problems in the unborn baby due to this medicine if an exposure happens during pregnancy. . Pregnancy warning: The drug may have harmful effects on the unborn child, so effective methods of birth control should be used during your cancer treatment. . Breast feeding warning: Women should not breast feed during treatment because this drug could enter the breast milk and badly harm a breast feeding baby.   Gemcitabine (Generic Name) Other Names: Gemzar  About This Drug Gemcitabine is used to treat cancer. This drug is given in the vein (IV).  Possible Side Effects (Most Common) . Bone marrow depression. This is a decrease in the number of white blood cells, red blood cells, and platelets. This may raise your risk of infection, make you tired and weak (fatigue), and raise your risk of bleeding. . Nausea and throwing up (vomiting). These symptoms may happen within a few hours  after your treatment and may last up to several days. Medicines are available to stop or lessen these side effects. . Loose bowel movements (diarrhea) that may last for a few days . Fluid retention. You may have swelling of your arms, legs, face, chest or abdomen . Raised, red rash on arms, legs, back, or chest . Flu-like symptoms: fever, headache, muscle and joint aches, and fatigue (low energy, feeling weak) . Changes in your liver function. Your doctor will check your liver function as needed.  Possible Side Effects (Less Common) . Feeling short of breath . Decreased appetite (decreased hunger) . Effects on the nerves are called peripheral neuropathy. You may feel numbness, tingling, or pain in your hands and feet. It may be hard for you to button your clothes, open jars, or walk as usual. The effect on the nerves may get worse with more doses of the drug. These effects get better in some people after the drug is stopped but it does not get better in all people.  Treating Side Effects . Ask your doctor or nurse about medication that is available to help stop or lessen nausea, throwing up, loose bowel movements, headache, muscle and joint aches. . Drink 6-8 cups of fluids each day unless your doctor has told you to limit your fluid intake due to some other health problem. A cup is 8 ounces of fluid. If you throw up or have loose bowel movements you should drink more fluids so that you do not become dehydrated (lack water in the body due to losing too much fluid). . If you get a rash, do not put anything on it unless your doctor or nurse says you may. Keep the area around the rash clean and dry. Ask your doctor for medicine if your rash bothers you. . If you have numbness and tingling in your hands and feet, be careful when cooking, walking, and handling sharp objects and hot liquids.  Food and Drug Interactions There are no known interactions of gemcitabine with food. This drug may interact with  other medicines. Tell your doctor and pharmacist about all the medicines and dietary supplements (vitamins, minerals, herbs and others) that you are taking at this time. The safety and use of dietary supplements and alternative diets are often not known. Using these might affect your cancer or interfere with your treatment. Until more is known, you should not use dietary supplements or alternative diets without your cancer doctor's help.  When to Call the Doctor Call your doctor or nurse right away if you have any of these symptoms: . Fever  of 100.5 F (38 C) or above . Chills . Bleeding or bruising that is not normal . Wheezing or trouble breathing . Rash or itching . Nausea that stops you from eating or drinking . Loose bowel movements (diarrhea) more than 4 times a day or diarrhea with weakness or lightheadedness . Swelling of your arms, legs, face, chest or abdomen (fluid retention) Call your doctor or nurse as soon as possible if any of these symptoms happen: . Numbness, tingling, decreased feeling or weakness in fingers, toes, arms, or legs . Trouble walking or changes in the way you walk, feeling clumsy when buttoning clothes, opening jars, or other routine hand motions . Weight gain of 5 pounds in one week (fluid retention) . Lasting loss of appetite or rapid weight loss of five pounds in a week . Fatigue that interferes with your daily activities  Sexual Problems and Reproduction Concerns . Infertility warning: Sexual problems and reproduction concerns may happen. In both men and women, this drug may affect your ability to have children. This cannot be determined before your treatment. Talk with your doctor or nurse if you plan to have children. Ask for information on sperm or egg banking. . In men, this drug may interfere with your ability to make sperm, but it should not change your ability to have sexual relations. . In women, menstrual bleeding may become irregular or stop while  you are getting this drug. Do not assume that you cannot become pregnant if you do not have a menstrual period. . Women may go through signs of menopause (change of life) like vaginal dryness or itching. Vaginal lubricants can be used to lessen vaginal dryness, itching, and pain during sexual relations. . Genetic counseling is available for you to talk about the effects of this drug therapy on future pregnancies. Also, a genetic counselor can look at the possible risk of problems in the unborn baby due to this medicine if an exposure happens during pregnancy. . Pregnancy warning: This drug may have harmful effects on the unborn child, so effective methods of birth control should be used during your cancer treatment. . Breast feeding warning: It is not known if this drug passes into breast milk. For this reason, women should talk to their doctor about the risks and benefits of breast feeding during treatment with this drug because this drug may enter the breast milk and badly harm a breast feeding baby.   EDUCATIONAL MATERIALS GIVEN AND REVIEWED: Chemotherapy and you book given.  Information on cisplatin and gemzar.   SELF CARE ACTIVITIES WHILE ON CHEMOTHERAPY: Hydration Increase your fluid intake 48 hours prior to treatment and drink at least 8 to 12 cups (64 ounces) of water/decaff beverages per day after treatment. You can still have your cup of coffee or soda but these beverages do not count as part of your 8 to 12 cups that you need to drink daily. No alcohol intake.  Medications Continue taking your normal prescription medication as prescribed.  If you start any new herbal or new supplements please let us know first to make sure it is safe.  Mouth Care Have teeth cleaned professionally before starting treatment. Keep dentures and partial plates clean. Use soft toothbrush and do not use mouthwashes that contain alcohol. Biotene is a good mouthwash that is available at most pharmacies or may  be ordered by calling [phone]. Use warm salt water gargles (1 teaspoon salt per 1 quart warm water) before and after meals and at bedtime.  Or you may rinse with 2 tablespoons of three-percent hydrogen peroxide mixed in eight ounces of water. If you are still having problems with your mouth or sores in your mouth please call the clinic. If you need dental work, please let Dr. Whitney Muse know before you go for your appointment so that we can coordinate the best possible time for you in regards to your chemo regimen. You need to also let your dentist know that you are actively taking chemo. We may need to do labs prior to your dental appointment.   Skin Care Always use sunscreen that has not expired and with SPF (Sun Protection Factor) of 50 or higher. Wear hats to protect your head from the sun. Remember to use sunscreen on your hands, ears, face, & feet.  Use good moisturizing lotions such as udder cream, eucerin, or even Vaseline. Some chemotherapies can cause dry skin, color changes in your skin and nails.    . Avoid long, hot showers or baths. . Use gentle, fragrance-free soaps and laundry detergent. . Use moisturizers, preferably creams or ointments rather than lotions because the thicker consistency is better at preventing skin dehydration. Apply the cream or ointment within 15 minutes of showering. Reapply moisturizer at night, and moisturize your hands every time after you wash them.  Hair Loss (if your doctor says your hair will fall out)  . If your doctor says that your hair is likely to fall out, decide before you begin chemo whether you want to wear a wig. You may want to shop before treatment to match your hair color. . Hats, turbans, and scarves can also camouflage hair loss, although some people prefer to leave their heads uncovered. If you go bare-headed outdoors, be sure to use sunscreen on your scalp. . Cut your hair short. It eases the inconvenience of shedding lots of hair, but it  also can reduce the emotional impact of watching your hair fall out. . Don't perm or color your hair during chemotherapy. Those chemical treatments are already damaging to hair and can enhance hair loss. Once your chemo treatments are done and your hair has grown back, it's OK to resume dyeing or perming hair. With chemotherapy, hair loss is almost always temporary. But when it grows back, it may be a different color or texture. In older adults who still had hair color before chemotherapy, the new growth may be completely gray.  Often, new hair is very fine and soft.  Infection Prevention Please wash your hands for at least 30 seconds using warm soapy water. Handwashing is the #1 way to prevent the spread of germs. Stay away from sick people or people who are getting over a cold. If you develop respiratory systems such as green/yellow mucus production or productive cough or persistent cough let us know and we will see if you need an antibiotic. It is a good idea to keep a pair of gloves on when going into grocery stores/[employer] to decrease your risk of coming into contact with germs on the carts, etc. Carry alcohol hand gel with you at all times and use it frequently if out in public. If your temperature reaches 100.5 or higher please call the clinic and let us know.  If it is after hours or on the weekend please go to the ER if your temperature is over 100.5.  Please have your own personal thermometer at home to use.    Sex and bodily fluids If you are going to have sex, a  condom must be used to protect the person that isn't taking chemotherapy. Chemo can decrease your libido (sex drive). For a few days after chemotherapy, chemotherapy can be excreted through your bodily fluids.  When using the toilet please close the lid and flush the toilet twice.  Do this for a few day after you have had chemotherapy.     Effects of chemotherapy on your sex life Some changes are simple and won't last long. They  won't affect your sex life permanently. Sometimes you may feel: . too tired . not strong enough to be very active . sick or sore  . not in the mood . anxious or low Your anxiety might not seem related to sex. For example, you may be worried about the cancer and how your treatment is going. Or you may be worried about money, or about how you family are coping with your illness. These things can cause stress, which can affect your interest in sex. It's important to talk to your partner about how you feel. Remember - the changes to your sex life don't usually last long. There's usually no medical reason to stop having sex during chemo. The drugs won't have any long term physical effects on your performance or enjoyment of sex. Cancer can't be passed on to your partner during sex  Contraception It's important to use reliable contraception during treatment. Avoid getting pregnant while you or your partner are having chemotherapy. This is because the drugs may harm the baby. Sometimes chemotherapy drugs can leave a man or woman infertile.  This means you would not be able to have children in the future. You might want to talk to someone about permanent infertility. It can be very difficult to learn that you may no longer be able to have children. Some people find counselling helpful. There might be ways to preserve your fertility, although this is easier for men than for women. You may want to speak to a fertility expert. You can talk about sperm banking or harvesting your eggs. You can also ask about other fertility options, such as donor eggs. If you have or have had breast cancer, your doctor might advise you not to take the contraceptive pill. This is because the hormones in it might affect the cancer.  It is not known for sure whether or not chemotherapy drugs can be passed on through semen or secretions from the vagina. Because of this some doctors advise people to use a barrier method if you have sex  during treatment. This applies to vaginal, anal or oral sex. Generally, doctors advise a barrier method only for the time you are actually having the treatment and for about a week after your treatment. Advice like this can be worrying, but this does not mean that you have to avoid being intimate with your partner. You can still have close contact with your partner and continue to enjoy sex.  Animals If you have cats or birds we just ask that you not change the litter or change the cage.  Please have someone else do this for you while you are on chemotherapy.   Food Safety During and After Cancer Treatment Food safety is important for people both during and after cancer treatment. Cancer and cancer treatments, such as chemotherapy, radiation therapy, and stem cell/bone marrow transplantation, often weaken the immune system. This makes it harder for your body to protect itself from foodborne illness, also called food poisoning. Foodborne illness is caused by eating food that  contains harmful bacteria, parasites, or viruses.  Foods to avoid Some foods have a higher risk of becoming tainted with bacteria. These include: Marland Kitchen Unwashed fresh fruit and vegetables, especially leafy vegetables that can hide dirt and other contaminants . Raw sprouts, such as alfalfa sprouts . Raw or undercooked beef, especially ground beef, or other raw or undercooked meat and poultry . Fatty, fried, or spicy foods immediately before or after treatment.  These can sit heavy on your stomach and make you feel nauseous. . Raw or undercooked shellfish, such as oysters. . Sushi and sashimi, which often contain raw fish.  . Unpasteurized beverages, such as unpasteurized fruit juices, raw milk, raw yogurt, or cider . Undercooked eggs, such as soft boiled, over easy, and poached; raw, unpasteurized eggs; or foods made with raw egg, such as homemade raw cookie dough and homemade mayonnaise Simple steps for food safety Shop  smart. . Do not buy food stored or displayed in an unclean area. . Do not buy bruised or damaged fruits or vegetables. . Do not buy cans that have cracks, dents, or bulges. . Pick up foods that can spoil at the end of your shopping trip and store them in a cooler on the way home. Prepare and clean up foods carefully. . Rinse all fresh fruits and vegetables under running water, and dry them with a clean towel or paper towel. . Clean the top of cans before opening them. . After preparing food, wash your hands for 20 seconds with hot water and soap. Pay special attention to areas between fingers and under nails. . Clean your utensils and dishes with hot water and soap. Marland Kitchen Disinfect your kitchen and cutting boards using 1 teaspoon of liquid, unscented bleach mixed into 1 quart of water.   Dispose of old food. . Eat canned and packaged food before its expiration date (the "use by" or "best before" date). . Consume refrigerated leftovers within 3 to 4 days. After that time, throw out the food. Even if the food does not smell or look spoiled, it still may be unsafe. Some bacteria, such as Listeria, can grow even on foods stored in the refrigerator if they are kept for too long. Take precautions when eating out. . At restaurants, avoid buffets and salad bars where food sits out for a long time and comes in contact with many people. Food can become contaminated when someone with a virus, often a norovirus, or another "bug" handles it. . Put any leftover food in a "to-go" container yourself, rather than having the server do it. And, refrigerate leftovers as soon as you get home. . Choose restaurants that are clean and that are willing to prepare your food as you order it cooked.   MEDICATIONS: Zofran/Ondansetron 8mg  tablet. Take 1 tablet every 8 hours as needed for nausea/vomiting. (#1 nausea med to take, this can constipate)  Compazine/Prochlorperazine 10mg  tablet. Take 1 tablet every 6 hours as needed  for nausea/vomiting. (#2 nausea med to take, this can make you sleepy)   EMLA cream. Apply a quarter size amount to port site 1 hour prior to chemo. Do not rub in. Cover with plastic wrap.   Over-the-Counter Meds:  Miralax 1 capful in 8 oz of fluid daily. May increase to two times a day if needed. This is a stool softener. If this doesn't work proceed you can add:  Senokot S-start with 1 tablet two times a day and increase to 4 tablets two times a day if needed. (total  of 8 tablets in a 24 hour period). This is a stimulant laxative.   Call us if this does not help your bowels move.   Imodium 2mg  capsule. Take 2 capsules after the 1st loose stool and then 1 capsule every 2 hours until you go a total of 12 hours without having a loose stool. Call the [facility] if loose stools continue. If diarrhea occurs @ bedtime, take 2 capsules @ bedtime. Then take 2 capsules every 4 hours until morning. Call [facility].    Diarrhea Sheet  If you are having loose stools/diarrhea, please purchase Imodium and begin taking as outlined:  At the first sign of poorly formed or loose stools you should begin taking Imodium(loperamide) 2 mg capsules.  Take two caplets (4mg ) followed by one caplet (2mg ) every 2 hours until you have had no diarrhea for 12 hours.  During the night take two caplets (4mg ) at bedtime and continue every 4 hours during the night until the morning.  Stop taking Imodium only after there is no sign of diarrhea for 12 hours.    Always call the [facility] if you are having loose stools/diarrhea that you can't get under control.  Loose stools/disrrhea leads to dehydration (loss of water) in your body.  We have other options of trying to get the loose stools/diarrhea to stopped but you must let us know!    Constipation Sheet *Miralax in 8 oz of fluid daily.  May increase to two times a day if needed.  This is a stool softener.  If this not enough to keep your bowel regular:  You can  add:  *Senokot S, start with one tablet twice a day and can increase to 4 tablets twice a day if needed.  This is a stimulant laxative.   Sometimes when you take pain medication you need BOTH a medicine to keep your stool soft and a medicine to help your bowel push it out!  Please call if the above does not work for you.   Do not go more than 2 days without a bowel movement.  It is very important that you do not become constipated.  It will make you feel sick to your stomach (nausea) and can cause abdominal pain and vomiting.     Nausea Sheet  Zofran/Ondansetron 8mg  tablet. Take 1 tablet every 8 hours as needed for nausea/vomiting. (#1 nausea med to take, this can constipate)  Compazine/Prochlorperazine 10mg  tablet. Take 1 tablet every 6 hours as needed for nausea/vomiting. (#2 nausea med to take, this can make you sleepy)  You can take these medications together or separately.  We would first like for you to try the Ondansetron by itself and then take the Prochloperizine if needed. But you are allowed to take both medications at the same time if your nausea is that severe.  If you are having persistent nausea (nausea that does not stop) please take these medications on a staggered schedule so that the nausea medication stays in your body.  Please call the [facility] and let us know the amount of nausea that you are experiencing.  If you begin to vomit, you need to call the [facility] and if it is the weekend and you have vomited more than one time and cant get it to stop-go to the Emergency Room.  Persistent nausea/vomiting can lead to dehydration (loss of fluid in your body) and will make you feel terrible.   Ice chips, sips of clear liquids, foods that  are @ room temperature, crackers, and toast tend to be better tolerated.     SYMPTOMS TO REPORT AS SOON AS POSSIBLE AFTER TREATMENT:  FEVER GREATER THAN 100.5 F  CHILLS WITH OR WITHOUT FEVER  NAUSEA AND VOMITING THAT IS NOT  CONTROLLED WITH YOUR NAUSEA MEDICATION  UNUSUAL SHORTNESS OF BREATH  UNUSUAL BRUISING OR BLEEDING  TENDERNESS IN MOUTH AND THROAT WITH OR WITHOUT PRESENCE OF ULCERS  URINARY PROBLEMS  BOWEL PROBLEMS  UNUSUAL RASH    Wear comfortable clothing and clothing appropriate for easy access to any Portacath or PICC line. Let us know if there is anything that we can do to make your therapy better!    What to do if you need assistance after hours or on the weekends: CALL [phone].  HOLD on the line, do not hang up.  You will hear multiple messages but at the end you will be connected with a nurse triage line.  They will contact Dr Whitney Muse if necessary.  Most of the time they will be able to assist you.   Do not call the hospital operator.  Dr Whitney Muse will not answer phone calls received by them.     I have been informed and understand all of the instructions given to me and have received a copy. I have been instructed to call the clinic [phone] or my family physician as soon as possible for continued medical care, if indicated. I do not have any more questions at this time but understand that I may call the [facility] or the Patient Navigator at [phone] during office hours should I have questions or need assistance in obtaining follow-up care.

## 2016-12-26 ENCOUNTER — Ambulatory Visit (HOSPITAL_COMMUNITY): Payer: Medicare HMO | Admitting: Hematology & Oncology

## 2016-12-26 ENCOUNTER — Inpatient Hospital Stay (HOSPITAL_COMMUNITY): Payer: Medicare HMO

## 2016-12-26 ENCOUNTER — Encounter (HOSPITAL_COMMUNITY): Payer: Self-pay | Admitting: Emergency Medicine

## 2016-12-26 ENCOUNTER — Other Ambulatory Visit (HOSPITAL_COMMUNITY): Payer: Self-pay | Admitting: Oncology

## 2016-12-26 ENCOUNTER — Ambulatory Visit (HOSPITAL_COMMUNITY): Payer: Medicare HMO

## 2016-12-26 ENCOUNTER — Other Ambulatory Visit (HOSPITAL_COMMUNITY): Payer: Self-pay | Admitting: Pharmacist

## 2016-12-26 NOTE — Progress Notes (Signed)
Chemotherapy teaching pulled together and appts made. 

## 2016-12-30 ENCOUNTER — Encounter (HOSPITAL_COMMUNITY): Payer: Medicare HMO | Attending: Hematology & Oncology

## 2016-12-30 VITALS — BP 154/62 | HR 70 | Temp 98.0°F | Resp 18 | Wt 171.4 lb

## 2016-12-30 DIAGNOSIS — Z5111 Encounter for antineoplastic chemotherapy: Secondary | ICD-10-CM | POA: Diagnosis not present

## 2016-12-30 DIAGNOSIS — C679 Malignant neoplasm of bladder, unspecified: Secondary | ICD-10-CM | POA: Diagnosis not present

## 2016-12-30 LAB — CBC WITH DIFFERENTIAL/PLATELET
Basophils Absolute: 0 10*3/uL (ref 0.0–0.1)
Basophils Relative: 0 %
EOS ABS: 0.5 10*3/uL (ref 0.0–0.7)
Eosinophils Relative: 5 %
HEMATOCRIT: 40.8 % (ref 39.0–52.0)
HEMOGLOBIN: 13.4 g/dL (ref 13.0–17.0)
LYMPHS ABS: 2.4 10*3/uL (ref 0.7–4.0)
LYMPHS PCT: 24 %
MCH: 30.2 pg (ref 26.0–34.0)
MCHC: 32.8 g/dL (ref 30.0–36.0)
MCV: 91.9 fL (ref 78.0–100.0)
MONOS PCT: 7 %
Monocytes Absolute: 0.7 10*3/uL (ref 0.1–1.0)
NEUTROS PCT: 64 %
Neutro Abs: 6.5 10*3/uL (ref 1.7–7.7)
Platelets: 231 10*3/uL (ref 150–400)
RBC: 4.44 MIL/uL (ref 4.22–5.81)
RDW: 12.9 % (ref 11.5–15.5)
WBC: 10.1 10*3/uL (ref 4.0–10.5)

## 2016-12-30 LAB — COMPREHENSIVE METABOLIC PANEL
ALK PHOS: 79 U/L (ref 38–126)
ALT: 11 U/L — ABNORMAL LOW (ref 17–63)
ANION GAP: 8 (ref 5–15)
AST: 17 U/L (ref 15–41)
Albumin: 3.8 g/dL (ref 3.5–5.0)
BILIRUBIN TOTAL: 0.5 mg/dL (ref 0.3–1.2)
BUN: 15 mg/dL (ref 6–20)
CALCIUM: 8.6 mg/dL — AB (ref 8.9–10.3)
CO2: 26 mmol/L (ref 22–32)
Chloride: 104 mmol/L (ref 101–111)
Creatinine, Ser: 1.12 mg/dL (ref 0.61–1.24)
Glucose, Bld: 107 mg/dL — ABNORMAL HIGH (ref 65–99)
Potassium: 3.8 mmol/L (ref 3.5–5.1)
SODIUM: 138 mmol/L (ref 135–145)
Total Protein: 6.9 g/dL (ref 6.5–8.1)

## 2016-12-30 LAB — MAGNESIUM: MAGNESIUM: 2 mg/dL (ref 1.7–2.4)

## 2016-12-30 MED ORDER — HEPARIN SOD (PORK) LOCK FLUSH 100 UNIT/ML IV SOLN
500.0000 [IU] | Freq: Once | INTRAVENOUS | Status: AC | PRN
  Administered 2016-12-30: 500 [IU]
  Filled 2016-12-30: qty 5

## 2016-12-30 MED ORDER — POTASSIUM CHLORIDE 2 MEQ/ML IV SOLN
Freq: Once | INTRAVENOUS | Status: AC
  Administered 2016-12-30: 09:00:00 via INTRAVENOUS
  Filled 2016-12-30: qty 10

## 2016-12-30 MED ORDER — FOSAPREPITANT DIMEGLUMINE INJECTION 150 MG
Freq: Once | INTRAVENOUS | Status: AC
  Administered 2016-12-30: 11:00:00 via INTRAVENOUS
  Filled 2016-12-30: qty 5

## 2016-12-30 MED ORDER — PALONOSETRON HCL INJECTION 0.25 MG/5ML
0.2500 mg | Freq: Once | INTRAVENOUS | Status: AC
  Administered 2016-12-30: 0.25 mg via INTRAVENOUS
  Filled 2016-12-30: qty 5

## 2016-12-30 MED ORDER — SODIUM CHLORIDE 0.9 % IV SOLN
70.0000 mg/m2 | Freq: Once | INTRAVENOUS | Status: AC
  Administered 2016-12-30: 138 mg via INTRAVENOUS
  Filled 2016-12-30: qty 138

## 2016-12-30 MED ORDER — SODIUM CHLORIDE 0.9 % IV SOLN
Freq: Once | INTRAVENOUS | Status: AC
Start: 2016-12-30 — End: 2016-12-30
  Administered 2016-12-30: 11:00:00 via INTRAVENOUS

## 2016-12-30 MED ORDER — GEMCITABINE HCL CHEMO INJECTION 1 GM/26.3ML
1000.0000 mg/m2 | Freq: Once | INTRAVENOUS | Status: AC
  Administered 2016-12-30: 1976 mg via INTRAVENOUS
  Filled 2016-12-30: qty 51.97

## 2016-12-30 NOTE — Progress Notes (Signed)
Chemotherapy education completed.  Consent signed for gemzar and cisplatin.  Extensive teaching packet given.  Pt asked for something for sleep.  Spoke with Kirby Crigler PA, told to take Benadryl 25-50 mg at night or tylenol PM.  Pt said he could not take tylenol PM.  Pt will give me a call to see how he is doing on Wednesday.

## 2016-12-30 NOTE — Patient Instructions (Signed)
[facility] Discharge Instructions for Patients Receiving Chemotherapy   Beginning January 23rd 2017 lab work for the [facility] will be done in the  Main lab at [facility] on 1st floor. If you have a lab appointment with the [facility] please come in thru the  Main Entrance and check in at the main information desk   Today you received the following chemotherapy agents gemzar and cisplatin.  To help prevent nausea and vomiting after your treatment, we encourage you to take your nausea medication as instructed.   If you develop nausea and vomiting, or diarrhea that is not controlled by your medication, call the clinic.  The clinic phone number is [phone]. Office hours are Monday-Friday 8:30am-5:00pm.  BELOW ARE SYMPTOMS THAT SHOULD BE REPORTED IMMEDIATELY:  *FEVER GREATER THAN 101.0 F  *CHILLS WITH OR WITHOUT FEVER  NAUSEA AND VOMITING THAT IS NOT CONTROLLED WITH YOUR NAUSEA MEDICATION  *UNUSUAL SHORTNESS OF BREATH  *UNUSUAL BRUISING OR BLEEDING  TENDERNESS IN MOUTH AND THROAT WITH OR WITHOUT PRESENCE OF ULCERS  *URINARY PROBLEMS  *BOWEL PROBLEMS  UNUSUAL RASH Items with * indicate a potential emergency and should be followed up as soon as possible. If you have an emergency after office hours please contact your primary care physician or go to the nearest emergency department.  Please call the clinic during office hours if you have any questions or concerns.   You may also contact the Patient Navigator at [phone] should you have any questions or need assistance in obtaining follow up care.  Return as scheduled.   Resources For Cancer Patients and their Caregivers ? American Cancer Society: Can assist with transportation, wigs, general needs, runs Look Good Feel Better.        941-709-5118 ? Cancer Care: Provides financial assistance, online support groups, medication/co-pay assistance.  1-800-813-HOPE 218 865 9270) ? [place] Assists [place] cancer patients and their families through emotional , educational and financial support.  947-394-0740 ? [place] DSS Where to apply for food stamps, Medicaid and utility assistance. 902-719-1369 ? RCATS: Transportation to medical appointments. ([phone] ? Social Security Administration: May apply for disability if have a Stage IV cancer. [phone] 808-722-4169 ? [employer], Disability and Transit Services: Assists with nutrition, care and transit needs. [phone]

## 2016-12-30 NOTE — Progress Notes (Signed)
Tolerated chemo well. Stable and ambulatory on discharge home with wife. 

## 2017-01-02 ENCOUNTER — Ambulatory Visit (HOSPITAL_COMMUNITY): Payer: Medicare HMO | Admitting: Oncology

## 2017-01-06 ENCOUNTER — Encounter (HOSPITAL_COMMUNITY): Payer: Self-pay | Admitting: Oncology

## 2017-01-06 ENCOUNTER — Encounter (HOSPITAL_BASED_OUTPATIENT_CLINIC_OR_DEPARTMENT_OTHER): Payer: Medicare HMO

## 2017-01-06 ENCOUNTER — Encounter (HOSPITAL_BASED_OUTPATIENT_CLINIC_OR_DEPARTMENT_OTHER): Payer: Medicare HMO | Admitting: Oncology

## 2017-01-06 VITALS — BP 128/60 | HR 67 | Temp 98.0°F | Resp 18

## 2017-01-06 DIAGNOSIS — C679 Malignant neoplasm of bladder, unspecified: Secondary | ICD-10-CM

## 2017-01-06 DIAGNOSIS — G4700 Insomnia, unspecified: Secondary | ICD-10-CM | POA: Diagnosis not present

## 2017-01-06 DIAGNOSIS — Z5111 Encounter for antineoplastic chemotherapy: Secondary | ICD-10-CM

## 2017-01-06 DIAGNOSIS — Z720 Tobacco use: Secondary | ICD-10-CM | POA: Diagnosis not present

## 2017-01-06 LAB — CBC WITH DIFFERENTIAL/PLATELET
BASOS ABS: 0 10*3/uL (ref 0.0–0.1)
Basophils Relative: 1 %
Eosinophils Absolute: 0.1 10*3/uL (ref 0.0–0.7)
Eosinophils Relative: 2 %
HCT: 44 % (ref 39.0–52.0)
Hemoglobin: 14.3 g/dL (ref 13.0–17.0)
LYMPHS PCT: 48 %
Lymphs Abs: 2.9 10*3/uL (ref 0.7–4.0)
MCH: 29.7 pg (ref 26.0–34.0)
MCHC: 32.5 g/dL (ref 30.0–36.0)
MCV: 91.3 fL (ref 78.0–100.0)
MONO ABS: 0.1 10*3/uL (ref 0.1–1.0)
Monocytes Relative: 2 %
Neutro Abs: 2.8 10*3/uL (ref 1.7–7.7)
Neutrophils Relative %: 47 %
PLATELETS: 133 10*3/uL — AB (ref 150–400)
RBC: 4.82 MIL/uL (ref 4.22–5.81)
RDW: 12.5 % (ref 11.5–15.5)
WBC: 6 10*3/uL (ref 4.0–10.5)

## 2017-01-06 LAB — COMPREHENSIVE METABOLIC PANEL
ALT: 29 U/L (ref 17–63)
AST: 26 U/L (ref 15–41)
Albumin: 4 g/dL (ref 3.5–5.0)
Alkaline Phosphatase: 76 U/L (ref 38–126)
Anion gap: 7 (ref 5–15)
BUN: 22 mg/dL — AB (ref 6–20)
CHLORIDE: 102 mmol/L (ref 101–111)
CO2: 25 mmol/L (ref 22–32)
Calcium: 9 mg/dL (ref 8.9–10.3)
Creatinine, Ser: 1.37 mg/dL — ABNORMAL HIGH (ref 0.61–1.24)
GFR, EST AFRICAN AMERICAN: 58 mL/min — AB (ref >60)
GFR, EST NON AFRICAN AMERICAN: 50 mL/min — AB (ref >60)
Glucose, Bld: 89 mg/dL (ref 65–99)
POTASSIUM: 4.8 mmol/L (ref 3.5–5.1)
SODIUM: 134 mmol/L — AB (ref 135–145)
Total Bilirubin: 0.5 mg/dL (ref 0.3–1.2)
Total Protein: 7.2 g/dL (ref 6.5–8.1)

## 2017-01-06 LAB — MAGNESIUM: MAGNESIUM: 1.9 mg/dL (ref 1.7–2.4)

## 2017-01-06 MED ORDER — PROCHLORPERAZINE MALEATE 10 MG PO TABS
10.0000 mg | ORAL_TABLET | Freq: Once | ORAL | Status: AC
  Administered 2017-01-06: 10 mg via ORAL

## 2017-01-06 MED ORDER — PROCHLORPERAZINE MALEATE 10 MG PO TABS
ORAL_TABLET | ORAL | Status: AC
  Filled 2017-01-06: qty 1

## 2017-01-06 MED ORDER — HEPARIN SOD (PORK) LOCK FLUSH 100 UNIT/ML IV SOLN
500.0000 [IU] | Freq: Once | INTRAVENOUS | Status: AC | PRN
  Administered 2017-01-06: 500 [IU]
  Filled 2017-01-06: qty 5

## 2017-01-06 MED ORDER — SODIUM CHLORIDE 0.9 % IV SOLN
INTRAVENOUS | Status: DC
  Administered 2017-01-06: 13:00:00 via INTRAVENOUS

## 2017-01-06 MED ORDER — SODIUM CHLORIDE 0.9 % IV SOLN
1000.0000 mg/m2 | Freq: Once | INTRAVENOUS | Status: AC
  Administered 2017-01-06: 1976 mg via INTRAVENOUS
  Filled 2017-01-06: qty 52

## 2017-01-06 MED ORDER — ZOLPIDEM TARTRATE 5 MG PO TABS: 5.0000 mg | ORAL_TABLET | Freq: Every evening | ORAL | 1 refills | Status: DC | PRN

## 2017-01-06 MED ORDER — SODIUM CHLORIDE 0.9% FLUSH: 10.0000 mL | INTRAVENOUS | Status: DC | PRN

## 2017-01-06 NOTE — Patient Instructions (Addendum)
[facility] Discharge Instructions for Patients Receiving Chemotherapy   Beginning January 23rd 2017 lab work for the [facility] will be done in the  Main lab at [facility] on 1st floor. If you have a lab appointment with the [facility] please come in thru the  Main Entrance and check in at the main information desk   Today you received the following chemotherapy agents:  Gemcitabine Office visit with Kirby Crigler, [name] today. Prescription for Ambien given.  Return as scheduled for chemotherapy. Office visit as scheduled on 01/27/17.    To help prevent nausea and vomiting after your treatment, we encourage you to take your nausea medication as prescribed.  If you develop nausea and vomiting, or diarrhea that is not controlled by your medication, call the clinic.  The clinic phone number is [phone]. Office hours are Monday-Friday 8:30am-5:00pm.  BELOW ARE SYMPTOMS THAT SHOULD BE REPORTED IMMEDIATELY:  *FEVER GREATER THAN 101.0 F  *CHILLS WITH OR WITHOUT FEVER  NAUSEA AND VOMITING THAT IS NOT CONTROLLED WITH YOUR NAUSEA MEDICATION  *UNUSUAL SHORTNESS OF BREATH  *UNUSUAL BRUISING OR BLEEDING  TENDERNESS IN MOUTH AND THROAT WITH OR WITHOUT PRESENCE OF ULCERS  *URINARY PROBLEMS  *BOWEL PROBLEMS  UNUSUAL RASH Items with * indicate a potential emergency and should be followed up as soon as possible. If you have an emergency after office hours please contact your primary care physician or go to the nearest emergency department.  Please call the clinic during office hours if you have any questions or concerns.   You may also contact the Patient Navigator at [phone] should you have any questions or need assistance in obtaining follow up care.      Resources For Cancer Patients and their Caregivers ? American Cancer Society: Can assist with transportation, wigs, general needs, runs Look Good Feel Better.        919-730-2949 ? Cancer  Care: Provides financial assistance, online support groups, medication/co-pay assistance.  1-800-813-HOPE (908) 440-0490) ? [place] Assists [place] cancer patients and their families through emotional , educational and financial support.  [phone] ? [place] DSS Where to apply for food stamps, Medicaid and utility assistance. 586-618-1611 ? RCATS: Transportation to medical appointments. [phone] ? Social Security Administration: May apply for disability if have a Stage IV cancer. [phone] [phone] ? [employer], Disability and Transit Services: Assists with nutrition, care and transit needs. [phone]

## 2017-01-06 NOTE — Progress Notes (Signed)
[facility] [address] [place] [address]  Urothelial carcinoma of bladder [facility])  Insomnia, unspecified type - Plan: zolpidem (AMBIEN) 5 MG tablet  CURRENT THERAPY: Cisplatin/Gemcitabine days 1, 8, 15 every 28 days  INTERVAL HISTORY: Jeffery Ponce 71 y.o. male returns for followup of invasive urothelial carcinoma (pT2N0), but with clinical concern for T3 disease.      Urothelial carcinoma of bladder ([place])   11/15/2016 Imaging    CT abd/pelvis- Enhancing soft tissue lesion along the left bladder base, measuring approximately 3.2 x 2.7 cm, worrisome for primary bladder neoplasm. Correlate with tissue sampling on cystoscopy.  No findings suspicious for upper tract disease.  Cholelithiasis, without associated inflammatory changes.      11/19/2016 Procedure    TURBT by [name] of a 2 centimeter anterior bladder wall tumor, placement of epirubicin intravesical      11/19/2016 Procedure    50 milligrams of epirubicin and 50 mL of diluent.  This was left indwelling for 1 hour by [name]      11/22/2016 Pathology Results    Bladder, transurethral resection, bladder tumor INFILTRATING HIGH GRADE UROTHELIAL CARCINOMA THE CARCINOMA INVADES MUSCULARIS PROPRIA (DETRUSOR MUSCLE) Microscopic Comment The neoplasm stains positive for high molecular weight cytokeratin, p63 and negative for prostein. The immunostain pattern supports the diagnosis of urothelial carcinoma.      12/18/2016 Imaging    Bilateral interstitial prominence. Active pneumonitis cannot be excluded. These changes could be also be related chronic interstitial lung disease.      12/30/2016 -  Chemotherapy    The patient had palonosetron (ALOXI) injection 0.25 mg, 0.25 mg, Intravenous,  Once, 1 of 4 cycles  CISplatin (PLATINOL) 138 mg in sodium chloride 0.9 % 500 mL chemo infusion, 70 mg/m2 = 138 mg, Intravenous,  Once, 1 of 4 cycles  gemcitabine (GEMZAR) 1,976  mg in sodium chloride 0.9 % 250 mL chemo infusion, 1,000 mg/m2 = 1,976 mg, Intravenous,  Once, 1 of 4 cycles  fosaprepitant (EMEND) 150 mg, dexamethasone (DECADRON) 12 mg in sodium chloride 0.9 % 145 mL IVPB, , Intravenous,  Once, 1 of 4 cycles  for chemotherapy treatment.         He tolerated his first two treatments well.  He denies any nausea or vomiting.  He reports issues with insomnia.  He has tried Ambien in the past and this was effective for him.  He reports issues with appetite, namely a decreased appetite.  His weight is stable.    He continues to smoke.  Review of Systems  Constitutional: Negative.  Negative for chills, fever and weight loss.  HENT: Negative.   Eyes: Negative.   Respiratory: Negative.  Negative for cough.   Cardiovascular: Negative.  Negative for chest pain.  Gastrointestinal: Negative.  Negative for constipation, diarrhea, nausea and vomiting.  Genitourinary: Negative.   Musculoskeletal: Negative.   Skin: Negative.   Neurological: Negative.  Negative for weakness.  Endo/Heme/Allergies: Negative.   Psychiatric/Behavioral: Negative.     Past Medical History:  Diagnosis Date  . Arthritis   . History of kidney stones   . Hypercholesteremia   . Urothelial carcinoma ([place]) 12/10/2016  . Urothelial carcinoma of bladder ([place]) 12/10/2016    Past Surgical History:  Procedure Laterality Date  . CARDIAC CATHETERIZATION    . open reduction and internal fixation leg Right    hip and leg.  Marland Kitchen PORTACATH PLACEMENT N/A 12/23/2016   Procedure: PLACEMENT OF TUNNELED  CENTRAL VENOUS CATHETER RIGHT INTERNAL JUGULAR WITH SUBCUTANEOUS PORT;  Surgeon: Vickie Epley, MD;  Location: [facility] ORS;  Service: Vascular;  Laterality: N/A;  . reattatchment of left arm     from [place]  . TRANSURETHRAL RESECTION OF BLADDER TUMOR N/A 11/19/2016   Procedure: TRANSURETHRAL RESECTION OF BLADDER TUMOR (TURBT) WITH EPIRUBICIN INJECTION;  Surgeon: Franchot Gallo, MD;  Location: [facility]  ORS;  Service: Urology;  Laterality: N/A;    Family History  Problem Relation Age of Onset  . Aneurysm Mother   . Diabetes Father   . Colon cancer Brother     Social History   Social History  . Marital status: Married    Spouse name: N/A  . Number of children: N/A  . Years of education: N/A   Social History Main Topics  . Smoking status: Current Every Day Smoker    Packs/day: 2.00    Years: 50.00    Types: Cigarettes  . Smokeless tobacco: Never Used  . Alcohol use No  . Drug use: No  . Sexual activity: Not Asked     Comment: married-30 years-2 children by first wife   Other Topics Concern  . None   Social History Narrative  . None     PHYSICAL EXAMINATION  ECOG PERFORMANCE STATUS: 1 - Symptomatic but completely ambulatory  There were no vitals filed for this visit.  Vitals - 1 value per visit [id number]  SYSTOLIC 0000000  DIASTOLIC 60  Pulse 67  Temperature 98  Respirations 18    GENERAL:alert, no distress, well nourished, well developed, comfortable, cooperative, obese, smiling and accompanied by wife, in chemo-recliner. SKIN: skin color, texture, turgor are normal, no rashes or significant lesions HEAD: Normocephalic, No masses, lesions, tenderness or abnormalities EYES: normal, EOMI, Conjunctiva are pink and non-injected EARS: External ears normal OROPHARYNX:lips, buccal mucosa, and tongue normal and mucous membranes are moist  NECK: supple, trachea midline LYMPH:  no palpable lymphadenopathy BREAST:not examined LUNGS: clear to auscultation  HEART: regular rate & rhythm ABDOMEN:abdomen soft and normal bowel sounds BACK: Back symmetric, no curvature. EXTREMITIES:less then 2 second capillary refill, no joint deformities, effusion, or inflammation, no skin discoloration, no cyanosis  NEURO: alert & oriented x 3 with fluent speech, no focal motor/sensory deficits, gait normal   LABORATORY DATA: CBC    Component Value Date/Time   WBC 6.0 01/06/2017 1158     RBC 4.82 01/06/2017 1158   HGB 14.3 01/06/2017 1158   HCT 44.0 01/06/2017 1158   PLT 133 (L) 01/06/2017 1158   MCV 91.3 01/06/2017 1158   MCH 29.7 01/06/2017 1158   MCHC 32.5 01/06/2017 1158   RDW 12.5 01/06/2017 1158   LYMPHSABS 2.9 01/06/2017 1158   MONOABS 0.1 01/06/2017 1158   EOSABS 0.1 01/06/2017 1158   BASOSABS 0.0 01/06/2017 1158      Chemistry      Component Value Date/Time   NA 134 (L) 01/06/2017 1158   K 4.8 01/06/2017 1158   CL 102 01/06/2017 1158   CO2 25 01/06/2017 1158   BUN 22 (H) 01/06/2017 1158   CREATININE 1.37 (H) 01/06/2017 1158      Component Value Date/Time   CALCIUM 9.0 01/06/2017 1158   ALKPHOS 76 01/06/2017 1158   AST 26 01/06/2017 1158   ALT 29 01/06/2017 1158   BILITOT 0.5 01/06/2017 1158        PENDING LABS:   RADIOGRAPHIC STUDIES:  Dg Chest 2 View  Result Date: 12/18/2016 CLINICAL DATA:  Cancer. EXAM: CHEST  2 VIEW COMPARISON:  CT 06/01/2010 . FINDINGS: Mediastinum hilar structures normal. Heart size normal. Bilateral mild interstitial prominence noted. These changes could be related active interstitial lung disease. Chronic interstitial lung disease cannot be excluded. No pleural effusion or pneumothorax. Interposition of the colon under the right hemidiaphragm noted IMPRESSION: Bilateral interstitial prominence. Active pneumonitis cannot be excluded. These changes could be also be related chronic interstitial lung disease. Electronically Signed   By: Marcello Moores  Register   On: 12/18/2016 15:19   [place] Intraoperative  Result Date: 12/23/2016 CLINICAL DATA:  Ultrasound was provided for use by the ordering physician, and a technical charge was applied by the performing facility.  No radiologist interpretation/professional services rendered.   Dg Chest Port 1 View  Result Date: 12/23/2016 CLINICAL DATA:  Port-A-Cath insertion EXAM: PORTABLE CHEST 1 VIEW COMPARISON:  None. FINDINGS: Right-sided Port-A-Cath with the tip projecting over the  cavoatrial junction. There is no focal parenchymal opacity. There is no pleural effusion or pneumothorax. The heart and mediastinal contours are unremarkable. The osseous structures are unremarkable. IMPRESSION: Right-sided Port-A-Cath with the tip projecting over the cavoatrial junction. Electronically Signed   By: Kathreen Devoid   On: 12/23/2016 11:56   Dg C-arm 1-60 Min-no Report  Result Date: 12/23/2016 There is no Radiologist interpretation  for this exam.    PATHOLOGY:    ASSESSMENT AND PLAN:  Urothelial carcinoma of bladder ([place]) Invasive urothelial carcinoma (pT2N0) with TURBT by [name] on 11/19/2016 and epirubicin intra-bladder treatment BUT with concerns for clinically early T3 disease.  On Neoadjuvant Cisplpatin/Gemcitabine beginning on 12/30/2016.  Oncology history is updated.  Labs today: CBC diff, CMET, magnesium.  I personally reviewed and went over laboratory results with the patient.  The results are noted within this dictation.  He is tolerating treatment as expected.  Following neoadjuvant chemotherapy, he will need to see [name] for consideration of cystoprostatectomy.   ORDERS PLACED FOR THIS ENCOUNTER: No orders of the defined types were placed in this encounter.   MEDICATIONS PRESCRIBED THIS ENCOUNTER: Meds ordered this encounter  Medications  . zolpidem (AMBIEN) 5 MG tablet    Sig: Take 1 tablet (5 mg total) by mouth at bedtime as needed for sleep.    Dispense:  30 tablet    Refill:  1    Order Specific Question:   Supervising Provider    Answer:   Brunetta Genera PT:[id number]    THERAPY PLAN:  4 cycles of Cisplatin/Gemcitabine followed by consideration of cystoprostatectomy by [name] (Urology).  All questions were answered. The patient knows to call the clinic with any problems, questions or concerns. We can certainly see the patient much sooner if necessary.  Patient and plan discussed with Dr. Ancil Linsey and she is in agreement  with the aforementioned.   This note is electronically signed by: Doy Mince 01/06/2017 6:08 PM

## 2017-01-06 NOTE — Progress Notes (Signed)
Tolerated tx w/o adverse reaction.  Alert, in no distress.  VSS.  Discharged ambulatory. 

## 2017-01-06 NOTE — Assessment & Plan Note (Addendum)
Invasive urothelial carcinoma (pT2N0) with TURBT by [name] on 11/19/2016 and epirubicin intra-bladder treatment BUT with concerns for clinically early T3 disease.  On Neoadjuvant Cisplpatin/Gemcitabine beginning on 12/30/2016.  Oncology history is updated.  Labs today: CBC diff, CMET, magnesium.  I personally reviewed and went over laboratory results with the patient.  The results are noted within this dictation.  He is tolerating treatment as expected.  Following neoadjuvant chemotherapy, he will need to see [name] for consideration of cystoprostatectomy.

## 2017-01-06 NOTE — Patient Instructions (Signed)
[facility] Discharge Instructions for Patients Receiving Chemotherapy   Beginning January 23rd 2017 lab work for the [facility] will be done in the  Main lab at [facility] on 1st floor. If you have a lab appointment with the [facility] please come in thru the  Main Entrance and check in at the main information desk   Today you received the following chemotherapy agents:  Gemcitabine Office visit with Kirby Crigler, [name] today. Return as scheduled for chemotherapy. Office visit as scheduled on 01/27/17.    To help prevent nausea and vomiting after your treatment, we encourage you to take your nausea medication as prescribed.  If you develop nausea and vomiting, or diarrhea that is not controlled by your medication, call the clinic.  The clinic phone number is [phone]. Office hours are Monday-Friday 8:30am-5:00pm.  BELOW ARE SYMPTOMS THAT SHOULD BE REPORTED IMMEDIATELY:  *FEVER GREATER THAN 101.0 F  *CHILLS WITH OR WITHOUT FEVER  NAUSEA AND VOMITING THAT IS NOT CONTROLLED WITH YOUR NAUSEA MEDICATION  *UNUSUAL SHORTNESS OF BREATH  *UNUSUAL BRUISING OR BLEEDING  TENDERNESS IN MOUTH AND THROAT WITH OR WITHOUT PRESENCE OF ULCERS  *URINARY PROBLEMS  *BOWEL PROBLEMS  UNUSUAL RASH Items with * indicate a potential emergency and should be followed up as soon as possible. If you have an emergency after office hours please contact your primary care physician or go to the nearest emergency department.  Please call the clinic during office hours if you have any questions or concerns.   You may also contact the Patient Navigator at [phone] should you have any questions or need assistance in obtaining follow up care.      Resources For Cancer Patients and their Caregivers ? American Cancer Society: Can assist with transportation, wigs, general needs, runs Look Good Feel Better.        [phone] ? Cancer Care: Provides financial assistance,  online support groups, medication/co-pay assistance.  1-800-813-HOPE 725-031-3702) ? [place] Assists [place] cancer patients and their families through emotional , educational and financial support.  [phone] ? [place] DSS Where to apply for food stamps, Medicaid and utility assistance. [phone] ? [employer]: Transportation to medical appointments. [phone] ? Social Security Administration: May apply for disability if have a Stage IV cancer. [phone] [phone] ? [employer], Disability and Transit Services: Assists with nutrition, care and transit needs. [phone]

## 2017-01-13 ENCOUNTER — Other Ambulatory Visit (HOSPITAL_COMMUNITY): Payer: Self-pay | Admitting: Oncology

## 2017-01-13 ENCOUNTER — Encounter (HOSPITAL_COMMUNITY): Payer: Medicare HMO | Attending: Oncology

## 2017-01-13 DIAGNOSIS — C679 Malignant neoplasm of bladder, unspecified: Secondary | ICD-10-CM | POA: Diagnosis present

## 2017-01-13 LAB — CBC WITH DIFFERENTIAL/PLATELET
Basophils Absolute: 0 10*3/uL (ref 0.0–0.1)
Basophils Relative: 0 %
EOS ABS: 0 10*3/uL (ref 0.0–0.7)
Eosinophils Relative: 1 %
HEMATOCRIT: 35.4 % — AB (ref 39.0–52.0)
HEMOGLOBIN: 11.7 g/dL — AB (ref 13.0–17.0)
LYMPHS PCT: 73 %
Lymphs Abs: 2.5 10*3/uL (ref 0.7–4.0)
MCH: 30 pg (ref 26.0–34.0)
MCHC: 33.1 g/dL (ref 30.0–36.0)
MCV: 90.8 fL (ref 78.0–100.0)
Monocytes Absolute: 0.2 10*3/uL (ref 0.1–1.0)
Monocytes Relative: 5 %
NEUTROS PCT: 21 %
Neutro Abs: 0.7 10*3/uL — ABNORMAL LOW (ref 1.7–7.7)
Platelets: 66 10*3/uL — ABNORMAL LOW (ref 150–400)
RBC: 3.9 MIL/uL — AB (ref 4.22–5.81)
RDW: 12.5 % (ref 11.5–15.5)
WBC: 3.4 10*3/uL — AB (ref 4.0–10.5)

## 2017-01-13 LAB — MAGNESIUM: Magnesium: 2 mg/dL (ref 1.7–2.4)

## 2017-01-13 LAB — COMPREHENSIVE METABOLIC PANEL
ALK PHOS: 79 U/L (ref 38–126)
ALT: 16 U/L — AB (ref 17–63)
ANION GAP: 5 (ref 5–15)
AST: 17 U/L (ref 15–41)
Albumin: 3.5 g/dL (ref 3.5–5.0)
BILIRUBIN TOTAL: 0.2 mg/dL — AB (ref 0.3–1.2)
BUN: 19 mg/dL (ref 6–20)
CALCIUM: 8.3 mg/dL — AB (ref 8.9–10.3)
CO2: 24 mmol/L (ref 22–32)
CREATININE: 1.42 mg/dL — AB (ref 0.61–1.24)
Chloride: 109 mmol/L (ref 101–111)
GFR calc Af Amer: 56 mL/min — ABNORMAL LOW (ref >60)
GFR calc non Af Amer: 48 mL/min — ABNORMAL LOW (ref >60)
GLUCOSE: 134 mg/dL — AB (ref 65–99)
Potassium: 4.6 mmol/L (ref 3.5–5.1)
SODIUM: 138 mmol/L (ref 135–145)
TOTAL PROTEIN: 6.6 g/dL (ref 6.5–8.1)

## 2017-01-13 MED ORDER — HEPARIN SOD (PORK) LOCK FLUSH 100 UNIT/ML IV SOLN
INTRAVENOUS | Status: AC
  Filled 2017-01-13: qty 5

## 2017-01-13 MED ORDER — SODIUM CHLORIDE 0.9% FLUSH
10.0000 mL | INTRAVENOUS | Status: DC | PRN
  Administered 2017-01-13: 10 mL via INTRAVENOUS
  Filled 2017-01-13: qty 10

## 2017-01-13 MED ORDER — HEPARIN SOD (PORK) LOCK FLUSH 100 UNIT/ML IV SOLN
500.0000 [IU] | Freq: Once | INTRAVENOUS | Status: AC
  Administered 2017-01-13: 500 [IU] via INTRAVENOUS

## 2017-01-13 NOTE — Progress Notes (Signed)
Johny Shock tolerated port lab draw with flush well without complaints or incident. Labs reviewed with Kirby Crigler [name] and since platelets are 66 chemo tx will be held today and rescheduled for next week. Pt and wife instructed in this information and to call for  s/sx of any active bleeding.Both verbalized understanding. Port flushed with 10 ml NS and 5  Ml Heparin easily per protocol then de-accessed. Pt discharged self ambulatory in satisfactory condition accompanied by his wife

## 2017-01-13 NOTE — Patient Instructions (Signed)
[place] at [facility] Discharge Instructions  RECOMMENDATIONS MADE BY THE CONSULTANT AND ANY TEST RESULTS WILL BE SENT TO YOUR REFERRING PHYSICIAN.  Labs drawn from port today. Chemo tx held due to low platelets. Follow-up as scheduled. Call clinic for any questions or concerns  Thank you for choosing [place] at [facility] to provide your oncology and hematology care.  To afford each patient quality time with our provider, please arrive at least 15 minutes before your scheduled appointment time.    If you have a lab appointment with the [facility] please come in thru the  Main Entrance and check in at the main information desk  You need to re-schedule your appointment should you arrive 10 or more minutes late.  We strive to give you quality time with our providers, and arriving late affects you and other patients whose appointments are after yours.  Also, if you no show three or more times for appointments you may be dismissed from the clinic at the providers discretion.     Again, thank you for choosing [facility].  Our hope is that these requests will decrease the amount of time that you wait before being seen by our physicians.       _____________________________________________________________  Should you have questions after your visit to [facility], please contact our office at [phone] between the hours of 8:30 a.m. and 4:30 p.m.  Voicemails left after 4:30 p.m. will not be returned until the following business day.  For prescription refill requests, have your pharmacy contact our office.       Resources For Cancer Patients and their Caregivers ? American Cancer Society: Can assist with transportation, wigs, general needs, runs Look Good Feel Better.        ([phone] ? Cancer Care: Provides financial assistance, online support groups, medication/co-pay assistance.  1-800-813-HOPE  620-269-0988) ? [place] Assists [place] cancer patients and their families through emotional , educational and financial support.  956-867-9744 ? [place] DSS Where to apply for food stamps, Medicaid and utility assistance. 949 351 4234 ? RCATS: Transportation to medical appointments. 901-223-2554 ? Social Security Administration: May apply for disability if have a Stage IV cancer. 417-573-7079 445-163-0563 ? [employer], Disability and Transit Services: Assists with nutrition, care and transit needs. [place] Support Programs: @10RELATIVEDAYS @ > Cancer Support Group  2nd Tuesday of the month 1pm-2pm, Journey Room  > Creative Journey  3rd Tuesday of the month 1130am-1pm, Journey Room  > Look Good Feel Better  1st Wednesday of the month 10am-12 noon, Journey Room (Call [place] to register [phone])

## 2017-01-14 ENCOUNTER — Other Ambulatory Visit (HOSPITAL_COMMUNITY): Payer: Self-pay | Admitting: Hematology & Oncology

## 2017-01-20 ENCOUNTER — Encounter (HOSPITAL_COMMUNITY): Payer: Medicare HMO | Attending: Oncology

## 2017-01-20 VITALS — BP 118/61 | HR 66 | Temp 98.0°F | Resp 18 | Wt 175.8 lb

## 2017-01-20 DIAGNOSIS — C679 Malignant neoplasm of bladder, unspecified: Secondary | ICD-10-CM

## 2017-01-20 DIAGNOSIS — Z5111 Encounter for antineoplastic chemotherapy: Secondary | ICD-10-CM | POA: Diagnosis not present

## 2017-01-20 LAB — COMPREHENSIVE METABOLIC PANEL
ALK PHOS: 90 U/L (ref 38–126)
ALT: 16 U/L — ABNORMAL LOW (ref 17–63)
AST: 19 U/L (ref 15–41)
Albumin: 3.6 g/dL (ref 3.5–5.0)
Anion gap: 7 (ref 5–15)
BILIRUBIN TOTAL: 0.4 mg/dL (ref 0.3–1.2)
BUN: 17 mg/dL (ref 6–20)
CALCIUM: 8.8 mg/dL — AB (ref 8.9–10.3)
CO2: 24 mmol/L (ref 22–32)
Chloride: 105 mmol/L (ref 101–111)
Creatinine, Ser: 1.28 mg/dL — ABNORMAL HIGH (ref 0.61–1.24)
GFR, EST NON AFRICAN AMERICAN: 55 mL/min — AB (ref >60)
Glucose, Bld: 89 mg/dL (ref 65–99)
Potassium: 4.6 mmol/L (ref 3.5–5.1)
Sodium: 136 mmol/L (ref 135–145)
TOTAL PROTEIN: 6.7 g/dL (ref 6.5–8.1)

## 2017-01-20 LAB — CBC WITH DIFFERENTIAL/PLATELET
BASOS PCT: 0 %
Basophils Absolute: 0 10*3/uL (ref 0.0–0.1)
Eosinophils Absolute: 0.1 10*3/uL (ref 0.0–0.7)
Eosinophils Relative: 2 %
HEMATOCRIT: 38.3 % — AB (ref 39.0–52.0)
HEMOGLOBIN: 12.5 g/dL — AB (ref 13.0–17.0)
LYMPHS ABS: 2.9 10*3/uL (ref 0.7–4.0)
LYMPHS PCT: 40 %
MCH: 30.3 pg (ref 26.0–34.0)
MCHC: 32.6 g/dL (ref 30.0–36.0)
MCV: 92.7 fL (ref 78.0–100.0)
MONOS PCT: 15 %
Monocytes Absolute: 1.1 10*3/uL — ABNORMAL HIGH (ref 0.1–1.0)
NEUTROS ABS: 3.2 10*3/uL (ref 1.7–7.7)
Neutrophils Relative %: 43 %
Platelets: 445 10*3/uL — ABNORMAL HIGH (ref 150–400)
RBC: 4.13 MIL/uL — ABNORMAL LOW (ref 4.22–5.81)
RDW: 13.5 % (ref 11.5–15.5)
WBC: 7.3 10*3/uL (ref 4.0–10.5)

## 2017-01-20 LAB — MAGNESIUM: MAGNESIUM: 2 mg/dL (ref 1.7–2.4)

## 2017-01-20 MED ORDER — SODIUM CHLORIDE 0.9 % IV SOLN
Freq: Once | INTRAVENOUS | Status: AC
  Administered 2017-01-20: 14:00:00 via INTRAVENOUS

## 2017-01-20 MED ORDER — HEPARIN SOD (PORK) LOCK FLUSH 100 UNIT/ML IV SOLN
500.0000 [IU] | Freq: Once | INTRAVENOUS | Status: AC | PRN
  Administered 2017-01-20: 500 [IU]
  Filled 2017-01-20: qty 5

## 2017-01-20 MED ORDER — PROCHLORPERAZINE MALEATE 10 MG PO TABS
ORAL_TABLET | ORAL | Status: AC
  Filled 2017-01-20: qty 1

## 2017-01-20 MED ORDER — SODIUM CHLORIDE 0.9 % IV SOLN
1000.0000 mg/m2 | Freq: Once | INTRAVENOUS | Status: AC
  Administered 2017-01-20: 1976 mg via INTRAVENOUS
  Filled 2017-01-20: qty 51.97

## 2017-01-20 MED ORDER — SODIUM CHLORIDE 0.9% FLUSH
10.0000 mL | INTRAVENOUS | Status: DC | PRN
  Administered 2017-01-20: 10 mL
  Filled 2017-01-20: qty 10

## 2017-01-20 MED ORDER — PROCHLORPERAZINE MALEATE 10 MG PO TABS
10.0000 mg | ORAL_TABLET | Freq: Once | ORAL | Status: AC
  Administered 2017-01-20: 10 mg via ORAL

## 2017-01-20 NOTE — Progress Notes (Signed)
Jeffery Ponce tolerated chemo tx well without complaints or incident.Labs reviewed with Kirby Crigler [name] prior to administering chemotherapy. VSS upon discharge. Pt discharged self ambulatory in satisfactory condition accompanied by his wife

## 2017-01-20 NOTE — Patient Instructions (Signed)
[facility] Discharge Instructions for Patients Receiving Chemotherapy   Beginning January 23rd 2017 lab work for the Cancer Center will be done in the  Main lab at [facility] on 1st floor. If you have a lab appointment with the Cancer Center please come in thru the  Main Entrance and check in at the main information desk   Today you received the following chemotherapy agents Gemzar. Follow-up as scheduled. Call clinic for any questions or concerns  To help prevent nausea and vomiting after your treatment, we encourage you to take your nausea medication   If you develop nausea and vomiting, or diarrhea that is not controlled by your medication, call the clinic.  The clinic phone number is [phone]. Office hours are Monday-Friday 8:30am-5:00pm.  BELOW ARE SYMPTOMS THAT SHOULD BE REPORTED IMMEDIATELY:  *FEVER GREATER THAN 101.0 F  *CHILLS WITH OR WITHOUT FEVER  NAUSEA AND VOMITING THAT IS NOT CONTROLLED WITH YOUR NAUSEA MEDICATION  *UNUSUAL SHORTNESS OF BREATH  *UNUSUAL BRUISING OR BLEEDING  TENDERNESS IN MOUTH AND THROAT WITH OR WITHOUT PRESENCE OF ULCERS  *URINARY PROBLEMS  *BOWEL PROBLEMS  UNUSUAL RASH Items with * indicate a potential emergency and should be followed up as soon as possible. If you have an emergency after office hours please contact your primary care physician or go to the nearest emergency department.  Please call the clinic during office hours if you have any questions or concerns.   You may also contact the Patient Navigator at [phone] should you have any questions or need assistance in obtaining follow up care.      Resources For Cancer Patients and their Caregivers ? American Cancer Society: Can assist with transportation, wigs, general needs, runs Look Good Feel Better.        1-888-227-6333 ? Cancer Care: Provides financial assistance, online support groups, medication/co-pay assistance.  1-800-813-HOPE  (4673) ? [facility] Assists [place] cancer patients and their families through emotional , educational and financial support.  336-427-4357 ? [place] DSS Where to apply for food stamps, Medicaid and utility assistance. 336-342-1394 ? RCATS: Transportation to medical appointments. 336-347-2287 ? Social Security Administration: May apply for disability if have a Stage IV cancer. 336-342-7796 1-800-772-1213 ? [place] Aging, Disability and Transit Services: Assists with nutrition, care and transit needs. 336-349-2343         

## 2017-01-27 ENCOUNTER — Ambulatory Visit (HOSPITAL_COMMUNITY): Payer: Medicare HMO | Admitting: Hematology & Oncology

## 2017-01-27 ENCOUNTER — Ambulatory Visit (HOSPITAL_COMMUNITY): Payer: Medicare HMO | Admitting: Adult Health

## 2017-01-27 ENCOUNTER — Ambulatory Visit (HOSPITAL_COMMUNITY): Payer: Medicare HMO

## 2017-02-03 ENCOUNTER — Encounter (HOSPITAL_COMMUNITY): Payer: Self-pay | Admitting: Oncology

## 2017-02-03 ENCOUNTER — Encounter (HOSPITAL_BASED_OUTPATIENT_CLINIC_OR_DEPARTMENT_OTHER): Payer: Medicare HMO

## 2017-02-03 ENCOUNTER — Encounter (HOSPITAL_BASED_OUTPATIENT_CLINIC_OR_DEPARTMENT_OTHER): Payer: Medicare HMO | Admitting: Oncology

## 2017-02-03 VITALS — BP 143/65 | HR 77 | Temp 98.1°F | Resp 16 | Wt 174.0 lb

## 2017-02-03 DIAGNOSIS — Z5111 Encounter for antineoplastic chemotherapy: Secondary | ICD-10-CM | POA: Diagnosis not present

## 2017-02-03 DIAGNOSIS — G4700 Insomnia, unspecified: Secondary | ICD-10-CM

## 2017-02-03 DIAGNOSIS — R4589 Other symptoms and signs involving emotional state: Secondary | ICD-10-CM

## 2017-02-03 DIAGNOSIS — C679 Malignant neoplasm of bladder, unspecified: Secondary | ICD-10-CM

## 2017-02-03 DIAGNOSIS — Z720 Tobacco use: Secondary | ICD-10-CM

## 2017-02-03 DIAGNOSIS — R110 Nausea: Secondary | ICD-10-CM

## 2017-02-03 DIAGNOSIS — F39 Unspecified mood [affective] disorder: Secondary | ICD-10-CM | POA: Diagnosis not present

## 2017-02-03 LAB — COMPREHENSIVE METABOLIC PANEL
ALBUMIN: 3.8 g/dL (ref 3.5–5.0)
ALK PHOS: 88 U/L (ref 38–126)
ALT: 15 U/L — AB (ref 17–63)
AST: 16 U/L (ref 15–41)
Anion gap: 6 (ref 5–15)
BUN: 16 mg/dL (ref 6–20)
CHLORIDE: 108 mmol/L (ref 101–111)
CO2: 23 mmol/L (ref 22–32)
CREATININE: 1.24 mg/dL (ref 0.61–1.24)
Calcium: 8.8 mg/dL — ABNORMAL LOW (ref 8.9–10.3)
GFR calc Af Amer: >60 mL/min (ref >60)
GFR calc non Af Amer: 57 mL/min — ABNORMAL LOW (ref >60)
GLUCOSE: 97 mg/dL (ref 65–99)
Potassium: 4.6 mmol/L (ref 3.5–5.1)
SODIUM: 137 mmol/L (ref 135–145)
Total Bilirubin: 0.3 mg/dL (ref 0.3–1.2)
Total Protein: 6.8 g/dL (ref 6.5–8.1)

## 2017-02-03 LAB — CBC WITH DIFFERENTIAL/PLATELET
BASOS ABS: 0 10*3/uL (ref 0.0–0.1)
BASOS PCT: 0 %
EOS ABS: 0.2 10*3/uL (ref 0.0–0.7)
Eosinophils Relative: 2 %
HCT: 39.4 % (ref 39.0–52.0)
HEMOGLOBIN: 12.9 g/dL — AB (ref 13.0–17.0)
LYMPHS ABS: 3.6 10*3/uL (ref 0.7–4.0)
Lymphocytes Relative: 27 %
MCH: 30.1 pg (ref 26.0–34.0)
MCHC: 32.7 g/dL (ref 30.0–36.0)
MCV: 92.1 fL (ref 78.0–100.0)
Monocytes Absolute: 1.5 10*3/uL — ABNORMAL HIGH (ref 0.1–1.0)
Monocytes Relative: 12 %
NEUTROS PCT: 59 %
Neutro Abs: 7.9 10*3/uL — ABNORMAL HIGH (ref 1.7–7.7)
PLATELETS: 217 10*3/uL (ref 150–400)
RBC: 4.28 MIL/uL (ref 4.22–5.81)
RDW: 14.9 % (ref 11.5–15.5)
WBC: 13.2 10*3/uL — AB (ref 4.0–10.5)

## 2017-02-03 LAB — MAGNESIUM: Magnesium: 1.9 mg/dL (ref 1.7–2.4)

## 2017-02-03 MED ORDER — SODIUM CHLORIDE 0.9 % IV SOLN
Freq: Once | INTRAVENOUS | Status: AC
Start: 2017-02-03 — End: 2017-02-03
  Administered 2017-02-03: 11:00:00 via INTRAVENOUS

## 2017-02-03 MED ORDER — POTASSIUM CHLORIDE 2 MEQ/ML IV SOLN
Freq: Once | INTRAVENOUS | Status: AC
  Administered 2017-02-03: 09:00:00 via INTRAVENOUS
  Filled 2017-02-03: qty 10

## 2017-02-03 MED ORDER — ZOLPIDEM TARTRATE 5 MG PO TABS: 5.0000 mg | ORAL_TABLET | Freq: Every evening | ORAL | 1 refills | Status: DC | PRN

## 2017-02-03 MED ORDER — FOSAPREPITANT DIMEGLUMINE INJECTION 150 MG
Freq: Once | INTRAVENOUS | Status: AC
  Administered 2017-02-03: 11:00:00 via INTRAVENOUS
  Filled 2017-02-03: qty 5

## 2017-02-03 MED ORDER — CISPLATIN CHEMO INJECTION 100MG/100ML
70.0000 mg/m2 | Freq: Once | INTRAVENOUS | Status: AC
  Administered 2017-02-03: 138 mg via INTRAVENOUS
  Filled 2017-02-03: qty 100

## 2017-02-03 MED ORDER — HEPARIN SOD (PORK) LOCK FLUSH 100 UNIT/ML IV SOLN
500.0000 [IU] | Freq: Once | INTRAVENOUS | Status: AC | PRN
  Administered 2017-02-03: 500 [IU]
  Filled 2017-02-03: qty 5

## 2017-02-03 MED ORDER — ESCITALOPRAM OXALATE 10 MG PO TABS: 10.0000 mg | ORAL_TABLET | Freq: Every day | ORAL | 1 refills | Status: DC

## 2017-02-03 MED ORDER — SODIUM CHLORIDE 0.9 % IV SOLN
1000.0000 mg/m2 | Freq: Once | INTRAVENOUS | Status: AC
  Administered 2017-02-03: 1976 mg via INTRAVENOUS
  Filled 2017-02-03: qty 51.97

## 2017-02-03 MED ORDER — PALONOSETRON HCL INJECTION 0.25 MG/5ML
0.2500 mg | Freq: Once | INTRAVENOUS | Status: AC
  Administered 2017-02-03: 0.25 mg via INTRAVENOUS
  Filled 2017-02-03: qty 5

## 2017-02-03 NOTE — Progress Notes (Signed)
Tolerated chemo well. Stable and ambulatory on discharge home with wife. 

## 2017-02-03 NOTE — Progress Notes (Signed)
[facility] [address] [place] [address]  Urothelial carcinoma of bladder [facility])  Insomnia, unspecified type - Plan: zolpidem (AMBIEN) 5 MG tablet  [name] - Plan: escitalopram (LEXAPRO) 10 MG tablet  CURRENT THERAPY: Cisplatin/Gemcitabine days 1, 8, 15 every 28 days  INTERVAL HISTORY: Jeffery Ponce 71 y.o. male returns for followup of invasive urothelial carcinoma (pT2N0), but with clinical concern for T3 disease.      Urothelial carcinoma of bladder ([place])   11/15/2016 Imaging    CT abd/pelvis- Enhancing soft tissue lesion along the left bladder base, measuring approximately 3.2 x 2.7 cm, worrisome for primary bladder neoplasm. Correlate with tissue sampling on cystoscopy.  No findings suspicious for upper tract disease.  Cholelithiasis, without associated inflammatory changes.      11/19/2016 Procedure    TURBT by [name] of a 2 centimeter anterior bladder wall tumor, placement of epirubicin intravesical      11/19/2016 Procedure    50 milligrams of epirubicin and 50 mL of diluent.  This was left indwelling for 1 hour by [name]      11/22/2016 Pathology Results    Bladder, transurethral resection, bladder tumor INFILTRATING HIGH GRADE UROTHELIAL CARCINOMA THE CARCINOMA INVADES MUSCULARIS PROPRIA (DETRUSOR MUSCLE) Microscopic Comment The neoplasm stains positive for high molecular weight cytokeratin, p63 and negative for prostein. The immunostain pattern supports the diagnosis of urothelial carcinoma.      12/18/2016 Imaging    Bilateral interstitial prominence. Active pneumonitis cannot be excluded. These changes could be also be related chronic interstitial lung disease.      12/30/2016 -  Chemotherapy    The patient had palonosetron (ALOXI) injection 0.25 mg, 0.25 mg, Intravenous,  Once, 1 of 4 cycles  CISplatin (PLATINOL) 138 mg in sodium chloride 0.9 % 500 mL chemo infusion, 70 mg/m2 = 138 mg, Intravenous,   Once, 1 of 4 cycles  gemcitabine (GEMZAR) 1,976 mg in sodium chloride 0.9 % 250 mL chemo infusion, 1,000 mg/m2 = 1,976 mg, Intravenous,  Once, 1 of 4 cycles  fosaprepitant (EMEND) 150 mg, dexamethasone (DECADRON) 12 mg in sodium chloride 0.9 % 145 mL IVPB, , Intravenous,  Once, 1 of 4 cycles  for chemotherapy treatment.         He is doing well.  He does note some issues with nausea and vomiting.  He notes one episode of emesis with his last treatment in the clinic.  He does note some nausea at home is well controlled with Zofran.  He denies any constipation or diarrhea.    He wanted to learn a little bit about his stage today and therefore we went over this.  He knows he is getting treated as a stage III with hopes of cure.  He and his family member admit to the moodiness and grouchiness.  He notes that this may be from inability to sleep.  He is willing to try something for his mood.  I think he would benefit from antidepressant.  Weight is stable.  Review of Systems  Constitutional: Negative.  Negative for chills, fever and weight loss.  HENT: Negative.   Eyes: Negative.   Respiratory: Negative.  Negative for cough.   Cardiovascular: Negative.  Negative for chest pain.  Gastrointestinal: Positive for nausea and vomiting. Negative for blood in stool, constipation, diarrhea and melena.  Genitourinary: Negative.   Musculoskeletal: Negative.   Skin: Negative.   Neurological: Negative.  Negative for weakness.  Endo/Heme/Allergies: Negative.  Psychiatric/Behavioral: Positive for depression ("moody, grouchy"). The patient has insomnia.     Past Medical History:  Diagnosis Date  . Arthritis   . History of kidney stones   . Hypercholesteremia   . Urothelial carcinoma ([place]) 12/10/2016  . Urothelial carcinoma of bladder ([place]) 12/10/2016    Past Surgical History:  Procedure Laterality Date  . CARDIAC CATHETERIZATION    . open reduction and internal fixation leg Right    hip and  leg.  Marland Kitchen PORTACATH PLACEMENT N/A 12/23/2016   Procedure: PLACEMENT OF TUNNELED CENTRAL VENOUS CATHETER RIGHT INTERNAL JUGULAR WITH SUBCUTANEOUS PORT;  Surgeon: Vickie Epley, MD;  Location: [facility];  Service: Vascular;  Laterality: N/A;  . reattatchment of left arm     from [place]  . TRANSURETHRAL RESECTION OF BLADDER TUMOR N/A 11/19/2016   Procedure: TRANSURETHRAL RESECTION OF BLADDER TUMOR (TURBT) WITH EPIRUBICIN INJECTION;  Surgeon: Franchot Gallo, MD;  Location: [facility];  Service: Urology;  Laterality: N/A;    Family History  Problem Relation Age of Onset  . Aneurysm Mother   . Diabetes Father   . Colon cancer Brother     Social History   Social History  . Marital status: Married    Spouse name: N/A  . Number of children: N/A  . Years of education: N/A   Social History Main Topics  . Smoking status: Current Every Day Smoker    Packs/day: 2.00    Years: 50.00    Types: Cigarettes  . Smokeless tobacco: Never Used  . Alcohol use No  . Drug use: No  . Sexual activity: Not Asked     Comment: married-30 years-2 children by first wife   Other Topics Concern  . None   Social History Narrative  . None     PHYSICAL EXAMINATION  ECOG PERFORMANCE STATUS: 1 - Symptomatic but completely ambulatory  There were no vitals filed for this visit.  Vitals - 1 value per visit [id number]  SYSTOLIC [id number]  DIASTOLIC 84  Pulse 74  Temperature 98.3  Respirations 16  Weight (lb) 174    GENERAL:alert, no distress, well nourished, well developed, comfortable, cooperative, obese, smiling and accompanied by wife, in chemo-recliner. SKIN: skin color, texture, turgor are normal, no rashes or significant lesions HEAD: Normocephalic, No masses, lesions, tenderness or abnormalities EYES: normal, EOMI, Conjunctiva are pink and non-injected EARS: External ears normal OROPHARYNX:lips, buccal mucosa, and tongue normal and mucous membranes are moist  NECK: supple, trachea midline LYMPH:   no palpable lymphadenopathy BREAST:not examined LUNGS: clear to auscultation with decreased breath sounds bilaterally. HEART: regular rate & rhythm without murmur, rub, gallop. ABDOMEN:abdomen soft and normal bowel sounds BACK: Back symmetric, no curvature. EXTREMITIES:less then 2 second capillary refill, no joint deformities, effusion, or inflammation, no skin discoloration, no cyanosis  NEURO: alert & oriented x 3 with fluent speech, no focal motor/sensory deficits, gait normal   LABORATORY DATA: CBC    Component Value Date/Time   WBC 13.2 (H) 02/03/2017 0840   RBC 4.28 02/03/2017 0840   HGB 12.9 (L) 02/03/2017 0840   HCT 39.4 02/03/2017 0840   PLT 217 02/03/2017 0840   MCV 92.1 02/03/2017 0840   MCH 30.1 02/03/2017 0840   MCHC 32.7 02/03/2017 0840   RDW 14.9 02/03/2017 0840   LYMPHSABS 3.6 02/03/2017 0840   MONOABS 1.5 (H) 02/03/2017 0840   EOSABS 0.2 02/03/2017 0840   BASOSABS 0.0 02/03/2017 0840      Chemistry      Component Value Date/Time  NA 136 01/20/2017 1317   K 4.6 01/20/2017 1317   CL 105 01/20/2017 1317   CO2 24 01/20/2017 1317   BUN 17 01/20/2017 1317   CREATININE 1.28 (H) 01/20/2017 1317      Component Value Date/Time   CALCIUM 8.8 (L) 01/20/2017 1317   ALKPHOS 90 01/20/2017 1317   AST 19 01/20/2017 1317   ALT 16 (L) 01/20/2017 1317   BILITOT 0.4 01/20/2017 1317        PENDING LABS:   RADIOGRAPHIC STUDIES:  No results found.   PATHOLOGY:    ASSESSMENT AND PLAN:  Urothelial carcinoma of bladder (HCC) Invasive urothelial carcinoma (pT2N0) with TURBT by [name] on 11/19/2016 and epirubicin intra-bladder treatment BUT with concerns for clinically early T3 disease.  On Neoadjuvant Cisplpatin/Gemcitabine beginning on 12/30/2016.  Oncology history is updated.  Labs today: CBC diff, CMET, magnesium.  I personally reviewed and went over laboratory results with the patient.  The results are noted within this dictation.  On his last  encounter he is provided a prescription for Ambien for sleep.  He notes he dropped the prescription and got wet and therefore never got the medicine filled.   He and his wife report "moodiness and grouchiness."  Therefore, he is given a prescription for Lexapro 10 mg daily.  In 2-3 weeks, if he is not improved, I would not hesitate to increase to 20 mg daily.  He notes some nausea at home for which Zofran is effective.  I have reviewed his premeds today, and if he has any issues with nausea/vomiting today during clinic hours, I would not hesitate to provide him with IV Ativan.  Following neoadjuvant chemotherapy, he will need to see [name] for consideration of cystoprostatectomy.   ORDERS PLACED FOR THIS ENCOUNTER: No orders of the defined types were placed in this encounter.   MEDICATIONS PRESCRIBED THIS ENCOUNTER: Meds ordered this encounter  Medications  . zolpidem (AMBIEN) 5 MG tablet    Sig: Take 1 tablet (5 mg total) by mouth at bedtime as needed for sleep.    Dispense:  30 tablet    Refill:  1    Order Specific Question:   Supervising Provider    Answer:   Brunetta Genera [id number]  . escitalopram (LEXAPRO) 10 MG tablet    Sig: Take 1 tablet (10 mg total) by mouth daily.    Dispense:  30 tablet    Refill:  1    Order Specific Question:   Supervising Provider    Answer:   Brunetta Genera [id number]    THERAPY PLAN:  4 cycles of Cisplatin/Gemcitabine followed by consideration of cystoprostatectomy by [name] (Urology).  All questions were answered. The patient knows to call the clinic with any problems, questions or concerns. We can certainly see the patient much sooner if necessary.  Patient and plan discussed with Dr. Twana First and she is in agreement with the aforementioned.   This note is electronically signed by: Doy Mince 02/03/2017 9:13 AM

## 2017-02-03 NOTE — Assessment & Plan Note (Addendum)
Invasive urothelial carcinoma (pT2N0) with TURBT by [name] on 11/19/2016 and epirubicin intra-bladder treatment BUT with concerns for clinically early T3 disease.  On Neoadjuvant Cisplpatin/Gemcitabine beginning on 12/30/2016.  Oncology history is updated.  Labs today: CBC diff, CMET, magnesium.  I personally reviewed and went over laboratory results with the patient.  The results are noted within this dictation.  On his last encounter he is provided a prescription for Ambien for sleep.  He notes he dropped the prescription and got wet and therefore never got the medicine filled.   He and his wife report "moodiness and grouchiness."  Therefore, he is given a prescription for Lexapro 10 mg daily.  In 2-3 weeks, if he is not improved, I would not hesitate to increase to 20 mg daily.  He notes some nausea at home for which Zofran is effective.  I have reviewed his premeds today, and if he has any issues with nausea/vomiting today during clinic hours, I would not hesitate to provide him with IV Ativan.  Following neoadjuvant chemotherapy, he will need to see [name] for consideration of cystoprostatectomy.

## 2017-02-03 NOTE — Patient Instructions (Signed)
[facility] Discharge Instructions for Patients Receiving Chemotherapy   Beginning January 23rd 2017 lab work for the Cancer Center will be done in the  Main lab at [facility] on 1st floor. If you have a lab appointment with the Cancer Center please come in thru the  Main Entrance and check in at the main information desk   Today you received the following chemotherapy agents cisplatin and gemzar.  To help prevent nausea and vomiting after your treatment, we encourage you to take your nausea medication as instructed.   If you develop nausea and vomiting, or diarrhea that is not controlled by your medication, call the clinic.  The clinic phone number is [phone]. Office hours are Monday-Friday 8:30am-5:00pm.  BELOW ARE SYMPTOMS THAT SHOULD BE REPORTED IMMEDIATELY:  *FEVER GREATER THAN 101.0 F  *CHILLS WITH OR WITHOUT FEVER  NAUSEA AND VOMITING THAT IS NOT CONTROLLED WITH YOUR NAUSEA MEDICATION  *UNUSUAL SHORTNESS OF BREATH  *UNUSUAL BRUISING OR BLEEDING  TENDERNESS IN MOUTH AND THROAT WITH OR WITHOUT PRESENCE OF ULCERS  *URINARY PROBLEMS  *BOWEL PROBLEMS  UNUSUAL RASH Items with * indicate a potential emergency and should be followed up as soon as possible. If you have an emergency after office hours please contact your primary care physician or go to the nearest emergency department.  Please call the clinic during office hours if you have any questions or concerns.   You may also contact the Patient Navigator at [phone] should you have any questions or need assistance in obtaining follow up care.      Resources For Cancer Patients and their Caregivers ? American Cancer Society: Can assist with transportation, wigs, general needs, runs Look Good Feel Better.        1-888-227-6333 ? Cancer Care: Provides financial assistance, online support groups, medication/co-pay assistance.  1-800-813-HOPE (4673) ? [facility] Assists [place] cancer patients and their families through emotional , educational and financial support.  336-427-4357 ? [place] DSS Where to apply for food stamps, Medicaid and utility assistance. 336-342-1394 ? [employer]ATS: Transportation to medical appointments. 336-347-2287 ? Social Security Administration: May apply for disability if have a Stage IV cancer. 336-342-7796 1-800-772-1213 ? [place] Aging, Disability and Transit Services: Assists with nutrition, care and transit needs. [phone]         

## 2017-02-03 NOTE — Patient Instructions (Addendum)
[place] at [facility] Discharge Instructions  RECOMMENDATIONS MADE BY THE CONSULTANT AND ANY TEST RESULTS WILL BE SENT TO YOUR REFERRING PHYSICIAN.  You were seen today by Kirby Crigler [name]. Refills for Ambien and Lexapro. Treatment today. Return in 1 week for treatment. Return in 2 weeks for treatment and follow up. Return in 4 weeks for treatment and follow up.    Thank you for choosing [place] at [facility] to provide your oncology and hematology care.  To afford each patient quality time with our provider, please arrive at least 15 minutes before your scheduled appointment time.    If you have a lab appointment with the [place] please come in thru the  Main Entrance and check in at the main information desk  You need to re-schedule your appointment should you arrive 10 or more minutes late.  We strive to give you quality time with our providers, and arriving late affects you and other patients whose appointments are after yours.  Also, if you no show three or more times for appointments you may be dismissed from the clinic at the providers discretion.     Again, thank you for choosing [facility].  Our hope is that these requests will decrease the amount of time that you wait before being seen by our physicians.       _____________________________________________________________  Should you have questions after your visit to [facility], please contact our office at [phone] between the hours of 8:30 a.m. and 4:30 p.m.  Voicemails left after 4:30 p.m. will not be returned until the following business day.  For prescription refill requests, have your pharmacy contact our office.       Resources For Cancer Patients and their Caregivers ? American Cancer Society: Can assist with transportation, wigs, general needs, runs Look Good Feel Better.        864-663-7828 ? Cancer Care: Provides  financial assistance, online support groups, medication/co-pay assistance.  1-800-813-HOPE 513-702-7173) ? [place] Assists [place] cancer patients and their families through emotional , educational and financial support.  (708)100-2047 ? [place] DSS Where to apply for food stamps, Medicaid and utility assistance. 787-592-1911 ? [employer]ATS: Transportation to medical appointments. 937-646-1816 ? Social Security Administration: May apply for disability if have a Stage IV cancer. ([phone] ? [employer], Disability and Transit Services: Assists with nutrition, care and transit needs. [place] Support Programs: @10RELATIVEDAYS @ > Cancer Support Group  2nd Tuesday of the month 1pm-2pm, Journey Room  > Creative Journey  3rd Tuesday of the month 1130am-1pm, Journey Room  > Look Good Feel Better  1st Wednesday of the month 10am-12 noon, Journey Room (Call [place] to register [phone])

## 2017-02-04 ENCOUNTER — Encounter (HOSPITAL_COMMUNITY): Payer: Self-pay | Admitting: Oncology

## 2017-02-10 ENCOUNTER — Encounter (HOSPITAL_COMMUNITY): Payer: Self-pay

## 2017-02-10 ENCOUNTER — Encounter (HOSPITAL_COMMUNITY): Payer: Medicare HMO | Attending: Oncology

## 2017-02-10 VITALS — BP 126/57 | HR 68 | Temp 97.9°F | Resp 18 | Wt 175.8 lb

## 2017-02-10 DIAGNOSIS — C679 Malignant neoplasm of bladder, unspecified: Secondary | ICD-10-CM | POA: Diagnosis not present

## 2017-02-10 DIAGNOSIS — Z5111 Encounter for antineoplastic chemotherapy: Secondary | ICD-10-CM

## 2017-02-10 LAB — COMPREHENSIVE METABOLIC PANEL
ALBUMIN: 3.6 g/dL (ref 3.5–5.0)
ALT: 20 U/L (ref 17–63)
AST: 16 U/L (ref 15–41)
Alkaline Phosphatase: 85 U/L (ref 38–126)
Anion gap: 7 (ref 5–15)
BUN: 22 mg/dL — ABNORMAL HIGH (ref 6–20)
CHLORIDE: 105 mmol/L (ref 101–111)
CO2: 24 mmol/L (ref 22–32)
CREATININE: 1.29 mg/dL — AB (ref 0.61–1.24)
Calcium: 8.6 mg/dL — ABNORMAL LOW (ref 8.9–10.3)
GFR calc non Af Amer: 54 mL/min — ABNORMAL LOW (ref >60)
GLUCOSE: 109 mg/dL — AB (ref 65–99)
Potassium: 4.7 mmol/L (ref 3.5–5.1)
SODIUM: 136 mmol/L (ref 135–145)
Total Bilirubin: 0.4 mg/dL (ref 0.3–1.2)
Total Protein: 6.7 g/dL (ref 6.5–8.1)

## 2017-02-10 LAB — CBC WITH DIFFERENTIAL/PLATELET
Basophils Absolute: 0 10*3/uL (ref 0.0–0.1)
Basophils Relative: 0 %
EOS ABS: 0.1 10*3/uL (ref 0.0–0.7)
Eosinophils Relative: 2 %
HEMATOCRIT: 35.1 % — AB (ref 39.0–52.0)
HEMOGLOBIN: 11.8 g/dL — AB (ref 13.0–17.0)
LYMPHS ABS: 2.8 10*3/uL (ref 0.7–4.0)
LYMPHS PCT: 34 %
MCH: 30.6 pg (ref 26.0–34.0)
MCHC: 33.6 g/dL (ref 30.0–36.0)
MCV: 91.2 fL (ref 78.0–100.0)
Monocytes Absolute: 0.7 10*3/uL (ref 0.1–1.0)
Monocytes Relative: 8 %
NEUTROS ABS: 4.6 10*3/uL (ref 1.7–7.7)
NEUTROS PCT: 56 %
Platelets: 127 10*3/uL — ABNORMAL LOW (ref 150–400)
RBC: 3.85 MIL/uL — ABNORMAL LOW (ref 4.22–5.81)
RDW: 14.6 % (ref 11.5–15.5)
WBC: 8.2 10*3/uL (ref 4.0–10.5)

## 2017-02-10 LAB — MAGNESIUM: Magnesium: 1.6 mg/dL — ABNORMAL LOW (ref 1.7–2.4)

## 2017-02-10 MED ORDER — GEMCITABINE HCL CHEMO INJECTION 1 GM/26.3ML
1000.0000 mg/m2 | Freq: Once | INTRAVENOUS | Status: AC
  Administered 2017-02-10: 1976 mg via INTRAVENOUS
  Filled 2017-02-10: qty 51.97

## 2017-02-10 MED ORDER — PROCHLORPERAZINE MALEATE 10 MG PO TABS
ORAL_TABLET | ORAL | Status: AC
  Filled 2017-02-10: qty 1

## 2017-02-10 MED ORDER — SODIUM CHLORIDE 0.9 % IV SOLN
Freq: Once | INTRAVENOUS | Status: AC
  Administered 2017-02-10: 13:00:00 via INTRAVENOUS

## 2017-02-10 MED ORDER — SODIUM CHLORIDE 0.9% FLUSH: 10.0000 mL | INTRAVENOUS | Status: DC | PRN

## 2017-02-10 MED ORDER — PROCHLORPERAZINE MALEATE 10 MG PO TABS
10.0000 mg | ORAL_TABLET | Freq: Once | ORAL | Status: AC
  Administered 2017-02-10: 10 mg via ORAL

## 2017-02-10 MED ORDER — HEPARIN SOD (PORK) LOCK FLUSH 100 UNIT/ML IV SOLN
500.0000 [IU] | Freq: Once | INTRAVENOUS | Status: AC | PRN
  Administered 2017-02-10: 500 [IU]
  Filled 2017-02-10: qty 5

## 2017-02-10 NOTE — Progress Notes (Signed)
Labs printed and reviewed with [name].  Per MD ok to treat, no additional care orders.  Patient tolerated infusion well.  VSS.  Patient ambulatory and stable upon discharge from clinic.

## 2017-02-10 NOTE — Patient Instructions (Signed)
[facility] Discharge Instructions for Patients Receiving Chemotherapy   Beginning January 23rd 2017 lab work for the Cancer Center will be done in the  Main lab at [facility] on 1st floor. If you have a lab appointment with the Cancer Center please come in thru the  Main Entrance and check in at the main information desk   Today you received the following chemotherapy agent: Gemzar.     If you develop nausea and vomiting, or diarrhea that is not controlled by your medication, call the clinic.  The clinic phone number is [phone]. Office hours are Monday-Friday 8:30am-5:00pm.  BELOW ARE SYMPTOMS THAT SHOULD BE REPORTED IMMEDIATELY:  *FEVER GREATER THAN 101.0 F  *CHILLS WITH OR WITHOUT FEVER  NAUSEA AND VOMITING THAT IS NOT CONTROLLED WITH YOUR NAUSEA MEDICATION  *UNUSUAL SHORTNESS OF BREATH  *UNUSUAL BRUISING OR BLEEDING  TENDERNESS IN MOUTH AND THROAT WITH OR WITHOUT PRESENCE OF ULCERS  *URINARY PROBLEMS  *BOWEL PROBLEMS  UNUSUAL RASH Items with * indicate a potential emergency and should be followed up as soon as possible. If you have an emergency after office hours please contact your primary care physician or go to the nearest emergency department.  Please call the clinic during office hours if you have any questions or concerns.   You may also contact the Patient Navigator at [phone] should you have any questions or need assistance in obtaining follow up care.      Resources For Cancer Patients and their Caregivers ? American Cancer Society: Can assist with transportation, wigs, general needs, runs Look Good Feel Better.        1-888-227-6333 ? Cancer Care: Provides financial assistance, online support groups, medication/co-pay assistance.  1-800-813-HOPE (4673) ? [facility] Assists [place] cancer patients and their families through emotional , educational and financial support.   336-427-4357 ? [place] DSS Where to apply for food stamps, Medicaid and utility assistance. 336-342-1394 ? RCATS: Transportation to medical appointments. 336-347-2287 ? Social Security Administration: May apply for disability if have a Stage IV cancer. 336-342-7796 1-800-772-1213 ? [place] Aging, Disability and Transit Services: Assists with nutrition, care and transit needs. [phone]          

## 2017-02-17 ENCOUNTER — Encounter (HOSPITAL_BASED_OUTPATIENT_CLINIC_OR_DEPARTMENT_OTHER): Payer: Medicare HMO

## 2017-02-17 ENCOUNTER — Encounter (HOSPITAL_COMMUNITY): Payer: Self-pay | Admitting: Oncology

## 2017-02-17 ENCOUNTER — Encounter (HOSPITAL_BASED_OUTPATIENT_CLINIC_OR_DEPARTMENT_OTHER): Payer: Medicare HMO | Admitting: Oncology

## 2017-02-17 DIAGNOSIS — C679 Malignant neoplasm of bladder, unspecified: Secondary | ICD-10-CM

## 2017-02-17 DIAGNOSIS — Z95828 Presence of other vascular implants and grafts: Secondary | ICD-10-CM

## 2017-02-17 DIAGNOSIS — Z720 Tobacco use: Secondary | ICD-10-CM

## 2017-02-17 LAB — CBC WITH DIFFERENTIAL/PLATELET
Basophils Absolute: 0 10*3/uL (ref 0.0–0.1)
Basophils Relative: 0 %
EOS ABS: 0 10*3/uL (ref 0.0–0.7)
EOS PCT: 0 %
HCT: 30.8 % — ABNORMAL LOW (ref 39.0–52.0)
Hemoglobin: 10.4 g/dL — ABNORMAL LOW (ref 13.0–17.0)
LYMPHS ABS: 2.1 10*3/uL (ref 0.7–4.0)
Lymphocytes Relative: 25 %
MCH: 30.7 pg (ref 26.0–34.0)
MCHC: 33.8 g/dL (ref 30.0–36.0)
MCV: 90.9 fL (ref 78.0–100.0)
MONOS PCT: 8 %
Monocytes Absolute: 0.7 10*3/uL (ref 0.1–1.0)
Neutro Abs: 5.7 10*3/uL (ref 1.7–7.7)
Neutrophils Relative %: 68 %
PLATELETS: 58 10*3/uL — AB (ref 150–400)
RBC: 3.39 MIL/uL — ABNORMAL LOW (ref 4.22–5.81)
RDW: 14.4 % (ref 11.5–15.5)
WBC: 8.4 10*3/uL (ref 4.0–10.5)

## 2017-02-17 LAB — COMPREHENSIVE METABOLIC PANEL
ALT: 11 U/L — ABNORMAL LOW (ref 17–63)
AST: 13 U/L — ABNORMAL LOW (ref 15–41)
Albumin: 3.3 g/dL — ABNORMAL LOW (ref 3.5–5.0)
Alkaline Phosphatase: 72 U/L (ref 38–126)
Anion gap: 6 (ref 5–15)
BUN: 23 mg/dL — ABNORMAL HIGH (ref 6–20)
CO2: 24 mmol/L (ref 22–32)
Calcium: 9 mg/dL (ref 8.9–10.3)
Chloride: 108 mmol/L (ref 101–111)
Creatinine, Ser: 1.39 mg/dL — ABNORMAL HIGH (ref 0.61–1.24)
GFR calc non Af Amer: 49 mL/min — ABNORMAL LOW (ref >60)
GFR, EST AFRICAN AMERICAN: 57 mL/min — AB (ref >60)
Glucose, Bld: 113 mg/dL — ABNORMAL HIGH (ref 65–99)
Potassium: 4.2 mmol/L (ref 3.5–5.1)
SODIUM: 138 mmol/L (ref 135–145)
Total Bilirubin: 0.3 mg/dL (ref 0.3–1.2)
Total Protein: 6.7 g/dL (ref 6.5–8.1)

## 2017-02-17 LAB — MAGNESIUM: Magnesium: 1.6 mg/dL — ABNORMAL LOW (ref 1.7–2.4)

## 2017-02-17 MED ORDER — HEPARIN SOD (PORK) LOCK FLUSH 100 UNIT/ML IV SOLN
INTRAVENOUS | Status: AC
  Filled 2017-02-17: qty 5

## 2017-02-17 MED ORDER — SODIUM CHLORIDE 0.9% FLUSH
10.0000 mL | Freq: Once | INTRAVENOUS | Status: AC
  Administered 2017-02-17: 10 mL via INTRAVENOUS

## 2017-02-17 MED ORDER — HEPARIN SOD (PORK) LOCK FLUSH 100 UNIT/ML IV SOLN
500.0000 [IU] | Freq: Once | INTRAVENOUS | Status: AC
  Administered 2017-02-17: 500 [IU] via INTRAVENOUS

## 2017-02-17 NOTE — Progress Notes (Signed)
[facility] [address] [place] Jeffery Ponce [address]  Urothelial carcinoma of bladder [facility])  CURRENT THERAPY: Cisplatin/Gemcitabine days 1, 8, 15 every 28 days beginning on 12/30/2016.  INTERVAL HISTORY: Jeffery Ponce 71 y.o. male returns for followup of invasive urothelial carcinoma (pT2N0), but with clinical concern for T3 disease.      Urothelial carcinoma of bladder ([place])   11/15/2016 Imaging    CT abd/pelvis- Enhancing soft tissue lesion along the left bladder base, measuring approximately 3.2 x 2.7 cm, worrisome for primary bladder neoplasm. Correlate with tissue sampling on cystoscopy.  No findings suspicious for upper tract disease.  Cholelithiasis, without associated inflammatory changes.      11/19/2016 Procedure    TURBT by [name] of a 2 centimeter anterior bladder wall tumor, placement of epirubicin intravesical      11/19/2016 Procedure    50 milligrams of epirubicin and 50 mL of diluent.  This was left indwelling for 1 hour by [name]      11/22/2016 Pathology Results    Bladder, transurethral resection, bladder tumor INFILTRATING HIGH GRADE UROTHELIAL CARCINOMA THE CARCINOMA INVADES MUSCULARIS PROPRIA (DETRUSOR MUSCLE) Microscopic Comment The neoplasm stains positive for high molecular weight cytokeratin, p63 and negative for prostein. The immunostain pattern supports the diagnosis of urothelial carcinoma.      12/18/2016 Imaging    Bilateral interstitial prominence. Active pneumonitis cannot be excluded. These changes could be also be related chronic interstitial lung disease.      12/30/2016 -  Chemotherapy    The patient had palonosetron (ALOXI) injection 0.25 mg, 0.25 mg, Intravenous,  Once, 1 of 4 cycles  CISplatin (PLATINOL) 138 mg in sodium chloride 0.9 % 500 mL chemo infusion, 70 mg/m2 = 138 mg, Intravenous,  Once, 1 of 4 cycles  gemcitabine (GEMZAR) 1,976 mg in sodium chloride 0.9 % 250 mL chemo  infusion, 1,000 mg/m2 = 1,976 mg, Intravenous,  Once, 1 of 4 cycles  fosaprepitant (EMEND) 150 mg, dexamethasone (DECADRON) 12 mg in sodium chloride 0.9 % 145 mL IVPB, , Intravenous,  Once, 1 of 4 cycles  for chemotherapy treatment.        02/17/2017 Treatment Plan Change    Treatment deferred x 7 days due to thrombocytopenia      02/17/2017 Treatment Plan Change    Gemcitabine dose reduced by 20% due to thrombocytopenia       He notes good tolerance to Lexapro.  He reports an improvement in his well-being and mood.  His wife confirms is not as grouchy.  She is concerned about how much time he spends in his room.  She reports that he spends most of his day in his room.  This is been ongoing for 3+ years.  He knows he spends majority of his time on the telephone talking to friends and other people.  She notes that over the past year or so he has been spending more time in his room.  He continues to work on smoking cessation.  He is down to 1 cigarette per day.  He otherwise is tolerating therapy well.  Interesting family dynamics is noted today between patient's wife and him.  Apparently, she has chronic pain from back injury.  She wants me to explain her injury to the patient and I deferred this as she is not a patient of mine and I am unfamiliar with her case.  Patient is advised to not to rifles at this time given  his right-sided port placement.  He is currently on an antibiotic and taper of prednisone for a recent sore throat.  He notes improvement in his symptoms.  Review of Systems  Constitutional: Negative.  Negative for chills, fever and weight loss.  HENT: Negative.   Eyes: Negative.   Respiratory: Negative.  Negative for cough.   Cardiovascular: Negative.  Negative for chest pain.  Gastrointestinal: Negative.  Negative for blood in stool, constipation, diarrhea, melena, nausea and vomiting.  Genitourinary: Negative.   Musculoskeletal: Negative.   Skin: Negative.     Neurological: Negative.  Negative for weakness.  Endo/Heme/Allergies: Negative.   Psychiatric/Behavioral: Negative.     Past Medical History:  Diagnosis Date  . Arthritis   . History of kidney stones   . Hypercholesteremia   . Urothelial carcinoma ([facility]) 12/10/2016  . Urothelial carcinoma of bladder ([place]) 12/10/2016    Past Surgical History:  Procedure Laterality Date  . CARDIAC CATHETERIZATION    . open reduction and internal fixation leg Right    hip and leg.  Marland Kitchen PORTACATH PLACEMENT N/A 12/23/2016   Procedure: PLACEMENT OF TUNNELED CENTRAL VENOUS CATHETER RIGHT INTERNAL JUGULAR WITH SUBCUTANEOUS PORT;  Surgeon: Vickie Epley, MD;  Location: [facility];  Service: Vascular;  Laterality: N/A;  . reattatchment of left arm     from [place]  . TRANSURETHRAL RESECTION OF BLADDER TUMOR N/A 11/19/2016   Procedure: TRANSURETHRAL RESECTION OF BLADDER TUMOR (TURBT) WITH EPIRUBICIN INJECTION;  Surgeon: Franchot Gallo, MD;  Location: [facility];  Service: Urology;  Laterality: N/A;    Family History  Problem Relation Age of Onset  . Aneurysm Mother   . Diabetes Father   . Colon cancer Brother     Social History   Social History  . Marital status: Married    Spouse name: N/A  . Number of children: N/A  . Years of education: N/A   Social History Main Topics  . Smoking status: Current Every Day Smoker    Packs/day: 2.00    Years: 50.00    Types: Cigarettes  . Smokeless tobacco: Never Used  . Alcohol use No  . Drug use: No  . Sexual activity: Not Asked     Comment: married-30 years-2 children by first wife   Other Topics Concern  . None   Social History Narrative  . None     PHYSICAL EXAMINATION  ECOG PERFORMANCE STATUS: 1 - Symptomatic but completely ambulatory  Vitals:   02/17/17 1106  BP: (!) 134/46  Pulse: 60  Resp: 16  Temp: 98.3 F (36.8 C)     GENERAL:alert, no distress, well nourished, well developed, comfortable, cooperative, obese, smiling and accompanied  by wife.  Unkempt. SKIN: skin color, texture, turgor are normal, no rashes or significant lesions HEAD: Normocephalic, No masses, lesions, tenderness or abnormalities EYES: normal, EOMI, Conjunctiva are pink and non-injected EARS: External ears normal OROPHARYNX:lips, buccal mucosa, and tongue normal and mucous membranes are moist  NECK: supple, trachea midline LYMPH:  no palpable lymphadenopathy BREAST:not examined LUNGS: clear to auscultation with decreased breath sounds bilaterally. HEART: regular rate & rhythm without murmur, rub, gallop. ABDOMEN:abdomen soft and normal bowel sounds BACK: Back symmetric, no curvature. EXTREMITIES:less then 2 second capillary refill, no joint deformities, effusion, or inflammation, no skin discoloration, no cyanosis  NEURO: alert & oriented x 3 with fluent speech, no focal motor/sensory deficits, gait normal   LABORATORY DATA: CBC    Component Value Date/Time   WBC 8.4 02/17/2017 1030   RBC 3.39 (  L) 02/17/2017 1030   HGB 10.4 (L) 02/17/2017 1030   HCT 30.8 (L) 02/17/2017 1030   PLT 58 (L) 02/17/2017 1030   MCV 90.9 02/17/2017 1030   MCH 30.7 02/17/2017 1030   MCHC 33.8 02/17/2017 1030   RDW 14.4 02/17/2017 1030   LYMPHSABS 2.1 02/17/2017 1030   MONOABS 0.7 02/17/2017 1030   EOSABS 0.0 02/17/2017 1030   BASOSABS 0.0 02/17/2017 1030      Chemistry      Component Value Date/Time   NA 138 02/17/2017 1030   K 4.2 02/17/2017 1030   CL 108 02/17/2017 1030   CO2 24 02/17/2017 1030   BUN 23 (H) 02/17/2017 1030   CREATININE 1.39 (H) 02/17/2017 1030      Component Value Date/Time   CALCIUM 9.0 02/17/2017 1030   ALKPHOS 72 02/17/2017 1030   AST 13 (L) 02/17/2017 1030   ALT 11 (L) 02/17/2017 1030   BILITOT 0.3 02/17/2017 1030        PENDING LABS:   RADIOGRAPHIC STUDIES:  No results found.   PATHOLOGY:    ASSESSMENT AND PLAN:  Urothelial carcinoma of bladder (HCC) Invasive urothelial carcinoma (pT2N0) with TURBT by Dr.  Diona Fanti on 11/19/2016 and epirubicin intra-bladder treatment BUT with concerns for clinically early T3 disease.  On Neoadjuvant Cisplatin/Gemcitabine beginning on 12/30/2016.  Oncology history is updated.  Labs today: CBC diff, CMET, magnesium.  I personally reviewed and went over laboratory results with the patient.  The results are noted within this dictation.  Thrombocytopenia is noted.   Treatment is deferred x 1 week due to thrombocytopenia.    Gemcitabine dose is reduced by 20% moving forward.  He was recently started on Prednisone and antibiotic for "sore throat" by primary care provider.  He notes improvement in symptoms.  He is down to 1 cigarette per day.  He notes improvement in his well being since being on Lexapro.  He will continue with this intervention.  Following neoadjuvant chemotherapy, he will need to see [name] for consideration of cystoprostatectomy.  Return in 4 weeks for follow-up (which will be at the conclusion of cycle #3).   ORDERS PLACED FOR THIS ENCOUNTER: No orders of the defined types were placed in this encounter.   MEDICATIONS PRESCRIBED THIS ENCOUNTER: No orders of the defined types were placed in this encounter.   THERAPY PLAN:  4 cycles of Cisplatin/Gemcitabine followed by consideration of cystoprostatectomy by [name] (Urology).  All questions were answered. The patient knows to call the clinic with any problems, questions or concerns. We can certainly see the patient much sooner if necessary.  Patient and plan discussed with Dr. Twana First and she is in agreement with the aforementioned.   This note is electronically signed by: Doy Mince 02/17/2017 1:39 PM

## 2017-02-17 NOTE — Addendum Note (Signed)
Addended by: [name] on: 02/17/2017 03:34 PM   Modules accepted: Level of Service

## 2017-02-17 NOTE — Patient Instructions (Signed)
[place] at [facility] Discharge Instructions  RECOMMENDATIONS MADE BY THE CONSULTANT AND ANY TEST RESULTS WILL BE SENT TO YOUR REFERRING PHYSICIAN.  No chemo today, platelets are too low.  Return next week for day 15 chemo treatment.  Thank you for choosing [place] at [facility] to provide your oncology and hematology care.  To afford each patient quality time with our provider, please arrive at least 15 minutes before your scheduled appointment time.    If you have a lab appointment with the [place] please come in thru the  Main Entrance and check in at the main information desk  You need to re-schedule your appointment should you arrive 10 or more minutes late.  We strive to give you quality time with our providers, and arriving late affects you and other patients whose appointments are after yours.  Also, if you no show three or more times for appointments you may be dismissed from the clinic at the providers discretion.     Again, thank you for choosing [facility].  Our hope is that these requests will decrease the amount of time that you wait before being seen by our physicians.       _____________________________________________________________  Should you have questions after your visit to [facility], please contact our office at [phone] between the hours of 8:30 a.m. and 4:30 p.m.  Voicemails left after 4:30 p.m. will not be returned until the following business day.  For prescription refill requests, have your pharmacy contact our office.       Resources For Cancer Patients and their Caregivers ? American Cancer Society: Can assist with transportation, wigs, general needs, runs Look Good Feel Better.        8322470545 ? Cancer Care: Provides financial assistance, online support groups, medication/co-pay assistance.  1-800-813-HOPE 858-030-4917) ? [facility] Assists [place] cancer patients and their families through emotional , educational and financial support.  2531533953 ? [place] DSS Where to apply for food stamps, Medicaid and utility assistance. 806-263-1104 ? RCATS: Transportation to medical appointments. 706-212-2838 ? Social Security Administration: May apply for disability if have a Stage IV cancer. 657-167-2360 [phone] ? [employer], Disability and Transit Services: Assists with nutrition, care and transit needs. [place] Support Programs: @10RELATIVEDAYS @ > Cancer Support Group  2nd Tuesday of the month 1pm-2pm, Journey Room  > Creative Journey  3rd Tuesday of the month 1130am-1pm, Journey Room  > Look Good Feel Better  1st Wednesday of the month 10am-12 noon, Journey Room (Call [place] to register [phone])

## 2017-02-17 NOTE — Assessment & Plan Note (Addendum)
Invasive urothelial carcinoma (pT2N0) with TURBT by [name] on 11/19/2016 and epirubicin intra-bladder treatment BUT with concerns for clinically early T3 disease.  On Neoadjuvant Cisplatin/Gemcitabine beginning on 12/30/2016.  Oncology history is updated.  Labs today: CBC diff, CMET, magnesium.  I personally reviewed and went over laboratory results with the patient.  The results are noted within this dictation.  Thrombocytopenia is noted.   Treatment is deferred x 1 week due to thrombocytopenia.    Gemcitabine dose is reduced by 20% moving forward.  He was recently started on Prednisone and antibiotic for "sore throat" by primary care provider.  He notes improvement in symptoms.  He is down to 1 cigarette per day.  He notes improvement in his well being since being on Lexapro.  He will continue with this intervention.  Following neoadjuvant chemotherapy, he will need to see [name] for consideration of cystoprostatectomy.  Return in 4 weeks for follow-up (which will be at the conclusion of cycle #3).

## 2017-02-17 NOTE — Patient Instructions (Signed)
[place] at [facility] Discharge Instructions  RECOMMENDATIONS MADE BY THE CONSULTANT AND ANY TEST RESULTS WILL BE SENT TO YOUR REFERRING PHYSICIAN.  You were seen today by Kirby Crigler [name]. Treatment today if labs are good. Continue taking Lexapro as prescribed. Treatment as planned. Return in 4 weeks for follow up.   Thank you for choosing [place] at [facility] to provide your oncology and hematology care.  To afford each patient quality time with our provider, please arrive at least 15 minutes before your scheduled appointment time.    If you have a lab appointment with the [place] please come in thru the  Main Entrance and check in at the main information desk  You need to re-schedule your appointment should you arrive 10 or more minutes late.  We strive to give you quality time with our providers, and arriving late affects you and other patients whose appointments are after yours.  Also, if you no show three or more times for appointments you may be dismissed from the clinic at the providers discretion.     Again, thank you for choosing [facility].  Our hope is that these requests will decrease the amount of time that you wait before being seen by our physicians.       _____________________________________________________________  Should you have questions after your visit to [facility], please contact our office at [phone] between the hours of 8:30 a.m. and 4:30 p.m.  Voicemails left after 4:30 p.m. will not be returned until the following business day.  For prescription refill requests, have your pharmacy contact our office.       Resources For Cancer Patients and their Caregivers ? American Cancer Society: Can assist with transportation, wigs, general needs, runs Look Good Feel Better.        613-848-7629 ? Cancer Care: Provides financial assistance, online support groups,  medication/co-pay assistance.  1-800-813-HOPE 218-303-3447) ? [facility] Assists [place] cancer patients and their families through emotional , educational and financial support.  908-394-8810 ? [place] DSS Where to apply for food stamps, Medicaid and utility assistance. 949-855-2992 ? RCATS: Transportation to medical appointments. 516-640-9656 ? Social Security Administration: May apply for disability if have a Stage IV cancer. 860-086-9299 [phone]) [phone] ? [employer], Disability and Transit Services: Assists with nutrition, care and transit needs. [place] Support Programs: @10RELATIVEDAYS @ > Cancer Support Group  2nd Tuesday of the month 1pm-2pm, Journey Room  > Creative Journey  3rd Tuesday of the month 1130am-1pm, Journey Room  > Look Good Feel Better  1st Wednesday of the month 10am-12 noon, Journey Room (Call [place] to register [phone])

## 2017-02-17 NOTE — Progress Notes (Signed)
No chemo treatment today, platelets 58,000, treatment parameters not met. Reschedule day 15 next week and all future chemo appointments. Jeffery Ponce presented for Portacath access and flush. Proper placement of portacath confirmed by CXR. Portacath located right chest wall accessed with  H 20 needle. Good blood return present. Portacath flushed with 63m NS and 500U/535mHeparin and needle removed intact. Procedure without incident. Patient tolerated procedure well.

## 2017-02-24 ENCOUNTER — Encounter (HOSPITAL_BASED_OUTPATIENT_CLINIC_OR_DEPARTMENT_OTHER): Payer: Medicare HMO

## 2017-02-24 ENCOUNTER — Encounter (HOSPITAL_COMMUNITY): Payer: Self-pay

## 2017-02-24 VITALS — BP 129/57 | HR 72 | Temp 97.6°F | Resp 18 | Wt 175.4 lb

## 2017-02-24 DIAGNOSIS — C679 Malignant neoplasm of bladder, unspecified: Secondary | ICD-10-CM

## 2017-02-24 DIAGNOSIS — Z5111 Encounter for antineoplastic chemotherapy: Secondary | ICD-10-CM

## 2017-02-24 LAB — COMPREHENSIVE METABOLIC PANEL
ALT: 12 U/L — AB (ref 17–63)
AST: 13 U/L — ABNORMAL LOW (ref 15–41)
Albumin: 3.4 g/dL — ABNORMAL LOW (ref 3.5–5.0)
Alkaline Phosphatase: 87 U/L (ref 38–126)
Anion gap: 7 (ref 5–15)
BUN: 19 mg/dL (ref 6–20)
CHLORIDE: 105 mmol/L (ref 101–111)
CO2: 26 mmol/L (ref 22–32)
Calcium: 8.7 mg/dL — ABNORMAL LOW (ref 8.9–10.3)
Creatinine, Ser: 1.23 mg/dL (ref 0.61–1.24)
GFR calc non Af Amer: 57 mL/min — ABNORMAL LOW (ref >60)
Glucose, Bld: 97 mg/dL (ref 65–99)
POTASSIUM: 4.7 mmol/L (ref 3.5–5.1)
SODIUM: 138 mmol/L (ref 135–145)
Total Bilirubin: 0.3 mg/dL (ref 0.3–1.2)
Total Protein: 6.4 g/dL — ABNORMAL LOW (ref 6.5–8.1)

## 2017-02-24 LAB — CBC WITH DIFFERENTIAL/PLATELET
BASOS PCT: 0 %
Basophils Absolute: 0 10*3/uL (ref 0.0–0.1)
Eosinophils Absolute: 0.1 10*3/uL (ref 0.0–0.7)
Eosinophils Relative: 1 %
HEMATOCRIT: 34.1 % — AB (ref 39.0–52.0)
HEMOGLOBIN: 11 g/dL — AB (ref 13.0–17.0)
Lymphocytes Relative: 27 %
Lymphs Abs: 3.7 10*3/uL (ref 0.7–4.0)
MCH: 30.7 pg (ref 26.0–34.0)
MCHC: 32.3 g/dL (ref 30.0–36.0)
MCV: 95.3 fL (ref 78.0–100.0)
MONO ABS: 1.8 10*3/uL — AB (ref 0.1–1.0)
Monocytes Relative: 13 %
NEUTROS ABS: 8.3 10*3/uL — AB (ref 1.7–7.7)
NEUTROS PCT: 59 %
PLATELETS: 381 10*3/uL (ref 150–400)
RBC: 3.58 MIL/uL — ABNORMAL LOW (ref 4.22–5.81)
RDW: 17.8 % — ABNORMAL HIGH (ref 11.5–15.5)
WBC: 13.9 10*3/uL — AB (ref 4.0–10.5)

## 2017-02-24 LAB — MAGNESIUM: Magnesium: 1.8 mg/dL (ref 1.7–2.4)

## 2017-02-24 MED ORDER — HEPARIN SOD (PORK) LOCK FLUSH 100 UNIT/ML IV SOLN
500.0000 [IU] | Freq: Once | INTRAVENOUS | Status: AC | PRN
  Administered 2017-02-24: 500 [IU]
  Filled 2017-02-24: qty 5

## 2017-02-24 MED ORDER — SODIUM CHLORIDE 0.9% FLUSH
10.0000 mL | INTRAVENOUS | Status: DC | PRN
  Administered 2017-02-24: 10 mL
  Filled 2017-02-24: qty 10

## 2017-02-24 MED ORDER — SODIUM CHLORIDE 0.9 % IV SOLN
Freq: Once | INTRAVENOUS | Status: AC
  Administered 2017-02-24: 14:00:00 via INTRAVENOUS

## 2017-02-24 MED ORDER — SODIUM CHLORIDE 0.9 % IV SOLN
800.0000 mg/m2 | Freq: Once | INTRAVENOUS | Status: AC
  Administered 2017-02-24: 1558 mg via INTRAVENOUS
  Filled 2017-02-24: qty 25.22

## 2017-02-24 MED ORDER — PROCHLORPERAZINE MALEATE 10 MG PO TABS
10.0000 mg | ORAL_TABLET | Freq: Once | ORAL | Status: AC
Start: 2017-02-24 — End: 2017-02-24
  Administered 2017-02-24: 10 mg via ORAL
  Filled 2017-02-24: qty 1

## 2017-02-24 NOTE — Patient Instructions (Signed)
[facility] Discharge Instructions for Patients Receiving Chemotherapy   Beginning January 23rd 2017 lab work for the Cancer Center will be done in the  Main lab at [place] on 1st floor. If you have a lab appointment with the Cancer Center please come in thru the  Main Entrance and check in at the main information desk   Today you received the following chemotherapy agents Gemzar. Follow-up as scheduled. Call clinic for any questions or concerns  To help prevent nausea and vomiting after your treatment, we encourage you to take your nausea medication   If you develop nausea and vomiting, or diarrhea that is not controlled by your medication, call the clinic.  The clinic phone number is [phone]. Office hours are Monday-Friday 8:30am-5:00pm.  BELOW ARE SYMPTOMS THAT SHOULD BE REPORTED IMMEDIATELY:  *FEVER GREATER THAN 101.0 F  *CHILLS WITH OR WITHOUT FEVER  NAUSEA AND VOMITING THAT IS NOT CONTROLLED WITH YOUR NAUSEA MEDICATION  *UNUSUAL SHORTNESS OF BREATH  *UNUSUAL BRUISING OR BLEEDING  TENDERNESS IN MOUTH AND THROAT WITH OR WITHOUT PRESENCE OF ULCERS  *URINARY PROBLEMS  *BOWEL PROBLEMS  UNUSUAL RASH Items with * indicate a potential emergency and should be followed up as soon as possible. If you have an emergency after office hours please contact your primary care physician or go to the nearest emergency department.  Please call the clinic during office hours if you have any questions or concerns.   You may also contact the Patient Navigator at [phone] should you have any questions or need assistance in obtaining follow up care.      Resources For Cancer Patients and their Caregivers ? American Cancer Society: Can assist with transportation, wigs, general needs, runs Look Good Feel Better.        1-888-227-6333 ? Cancer Care: Provides financial assistance, online support groups, medication/co-pay assistance.  1-800-813-HOPE  (4673) ? [facility] Assists [place] cancer patients and their families through emotional , educational and financial support.  336-427-4357 ? [place] DSS Where to apply for food stamps, Medicaid and utility assistance. 336-342-1394 ? RCATS: Transportation to medical appointments. 336-347-2287 ? Social Security Administration: May apply for disability if have a Stage IV cancer. 336-342-7796 1-800-772-1213 ? [place] Aging, Disability and Transit Services: Assists with nutrition, care and transit needs. [phone]         

## 2017-02-24 NOTE — Progress Notes (Signed)
Jeffery Ponce tolerated chemo tx well without complaints or incident. Labs reviewed with Dr. Irene Limbo prior to administering chemotherapy.VSS upon discharge. Pt discharged self ambulatory in satisfactory condition accompanied by his wife

## 2017-03-03 ENCOUNTER — Ambulatory Visit (HOSPITAL_COMMUNITY): Payer: Medicare HMO

## 2017-03-10 ENCOUNTER — Encounter (HOSPITAL_COMMUNITY): Payer: Medicare HMO | Attending: Oncology

## 2017-03-10 ENCOUNTER — Ambulatory Visit (HOSPITAL_COMMUNITY): Payer: Medicare HMO

## 2017-03-10 ENCOUNTER — Encounter (HOSPITAL_COMMUNITY): Payer: Self-pay

## 2017-03-10 ENCOUNTER — Encounter (HOSPITAL_BASED_OUTPATIENT_CLINIC_OR_DEPARTMENT_OTHER): Payer: Medicare HMO | Admitting: Oncology

## 2017-03-10 VITALS — BP 152/47 | HR 76 | Temp 98.3°F | Resp 18 | Wt 171.2 lb

## 2017-03-10 DIAGNOSIS — C679 Malignant neoplasm of bladder, unspecified: Secondary | ICD-10-CM

## 2017-03-10 DIAGNOSIS — G4700 Insomnia, unspecified: Secondary | ICD-10-CM

## 2017-03-10 DIAGNOSIS — G8921 Chronic pain due to trauma: Secondary | ICD-10-CM

## 2017-03-10 DIAGNOSIS — Z5111 Encounter for antineoplastic chemotherapy: Secondary | ICD-10-CM | POA: Diagnosis not present

## 2017-03-10 DIAGNOSIS — Z720 Tobacco use: Secondary | ICD-10-CM | POA: Diagnosis not present

## 2017-03-10 LAB — COMPREHENSIVE METABOLIC PANEL
ALBUMIN: 3.6 g/dL (ref 3.5–5.0)
ALT: 12 U/L — AB (ref 17–63)
ANION GAP: 7 (ref 5–15)
AST: 18 U/L (ref 15–41)
Alkaline Phosphatase: 85 U/L (ref 38–126)
BUN: 14 mg/dL (ref 6–20)
CHLORIDE: 103 mmol/L (ref 101–111)
CO2: 25 mmol/L (ref 22–32)
CREATININE: 1.44 mg/dL — AB (ref 0.61–1.24)
Calcium: 9.1 mg/dL (ref 8.9–10.3)
GFR calc non Af Amer: 47 mL/min — ABNORMAL LOW (ref >60)
GFR, EST AFRICAN AMERICAN: 55 mL/min — AB (ref >60)
GLUCOSE: 105 mg/dL — AB (ref 65–99)
Potassium: 4.5 mmol/L (ref 3.5–5.1)
SODIUM: 135 mmol/L (ref 135–145)
Total Bilirubin: 0.3 mg/dL (ref 0.3–1.2)
Total Protein: 7 g/dL (ref 6.5–8.1)

## 2017-03-10 LAB — MAGNESIUM: Magnesium: 1.9 mg/dL (ref 1.7–2.4)

## 2017-03-10 LAB — CBC WITH DIFFERENTIAL/PLATELET
BASOS PCT: 0 %
Basophils Absolute: 0 10*3/uL (ref 0.0–0.1)
EOS ABS: 0.1 10*3/uL (ref 0.0–0.7)
Eosinophils Relative: 1 %
HEMATOCRIT: 33.4 % — AB (ref 39.0–52.0)
HEMOGLOBIN: 11.1 g/dL — AB (ref 13.0–17.0)
LYMPHS ABS: 2.4 10*3/uL (ref 0.7–4.0)
Lymphocytes Relative: 25 %
MCH: 31.6 pg (ref 26.0–34.0)
MCHC: 33.2 g/dL (ref 30.0–36.0)
MCV: 95.2 fL (ref 78.0–100.0)
MONOS PCT: 11 %
Monocytes Absolute: 1.1 10*3/uL — ABNORMAL HIGH (ref 0.1–1.0)
NEUTROS ABS: 6.1 10*3/uL (ref 1.7–7.7)
NEUTROS PCT: 63 %
Platelets: 192 10*3/uL (ref 150–400)
RBC: 3.51 MIL/uL — AB (ref 4.22–5.81)
RDW: 17.7 % — ABNORMAL HIGH (ref 11.5–15.5)
WBC: 9.7 10*3/uL (ref 4.0–10.5)

## 2017-03-10 MED ORDER — TEMAZEPAM 15 MG PO CAPS: 15.0000 mg | ORAL_CAPSULE | Freq: Every evening | ORAL | 0 refills | Status: DC | PRN

## 2017-03-10 MED ORDER — POTASSIUM CHLORIDE 2 MEQ/ML IV SOLN
Freq: Once | INTRAVENOUS | Status: AC
  Administered 2017-03-10: 09:00:00 via INTRAVENOUS
  Filled 2017-03-10: qty 10

## 2017-03-10 MED ORDER — SODIUM CHLORIDE 0.9 % IV SOLN
51.0000 mg/m2 | Freq: Once | INTRAVENOUS | Status: AC
  Administered 2017-03-10: 100 mg via INTRAVENOUS
  Filled 2017-03-10: qty 100

## 2017-03-10 MED ORDER — SODIUM CHLORIDE 0.9 % IV SOLN
800.0000 mg/m2 | Freq: Once | INTRAVENOUS | Status: AC
  Administered 2017-03-10: 1558 mg via INTRAVENOUS
  Filled 2017-03-10: qty 25.22

## 2017-03-10 MED ORDER — PALONOSETRON HCL INJECTION 0.25 MG/5ML
0.2500 mg | Freq: Once | INTRAVENOUS | Status: AC
  Administered 2017-03-10: 0.25 mg via INTRAVENOUS

## 2017-03-10 MED ORDER — HEPARIN SOD (PORK) LOCK FLUSH 100 UNIT/ML IV SOLN
500.0000 [IU] | Freq: Once | INTRAVENOUS | Status: AC | PRN
  Administered 2017-03-10: 500 [IU]

## 2017-03-10 MED ORDER — SODIUM CHLORIDE 0.9 % IV SOLN
Freq: Once | INTRAVENOUS | Status: AC
  Administered 2017-03-10: 11:00:00 via INTRAVENOUS
  Filled 2017-03-10: qty 5

## 2017-03-10 MED ORDER — PALONOSETRON HCL INJECTION 0.25 MG/5ML
INTRAVENOUS | Status: AC
  Filled 2017-03-10: qty 5

## 2017-03-10 MED ORDER — SODIUM CHLORIDE 0.9 % IV SOLN
Freq: Once | INTRAVENOUS | Status: AC
  Administered 2017-03-10: 11:00:00 via INTRAVENOUS

## 2017-03-10 MED ORDER — OXYBUTYNIN CHLORIDE 5 MG PO TABS: 5.0000 mg | ORAL_TABLET | Freq: Three times a day (TID) | ORAL | 1 refills | Status: DC | PRN
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
## 2017-03-10 NOTE — Patient Instructions (Signed)
[place] at [facility] Discharge Instructions  RECOMMENDATIONS MADE BY THE CONSULTANT AND ANY TEST RESULTS WILL BE SENT TO YOUR REFERRING PHYSICIAN.  You were seen today by Dr. Twana First Follow up on 4/30 See Amy up front for appointments   Thank you for choosing [place] at [facility] to provide your oncology and hematology care.  To afford each patient quality time with our provider, please arrive at least 15 minutes before your scheduled appointment time.    If you have a lab appointment with the [facility] please come in thru the  Main Entrance and check in at the main information desk  You need to re-schedule your appointment should you arrive 10 or more minutes late.  We strive to give you quality time with our providers, and arriving late affects you and other patients whose appointments are after yours.  Also, if you no show three or more times for appointments you may be dismissed from the clinic at the providers discretion.     Again, thank you for choosing [facility].  Our hope is that these requests will decrease the amount of time that you wait before being seen by our physicians.       _____________________________________________________________  Should you have questions after your visit to [facility], please contact our office at [phone] between the hours of 8:30 a.m. and 4:30 p.m.  Voicemails left after 4:30 p.m. will not be returned until the following business day.  For prescription refill requests, have your pharmacy contact our office.       Resources For Cancer Patients and their Caregivers ? American Cancer Society: Can assist with transportation, wigs, general needs, runs Look Good Feel Better.        (725)275-1820 ? Cancer Care: Provides financial assistance, online support groups, medication/co-pay assistance.  1-800-813-HOPE (505)735-6054) ? [facility] Assists [place] cancer patients and their families through emotional , educational and financial support.  (480)632-1502 ? [place] DSS Where to apply for food stamps, Medicaid and utility assistance. 682 240 2115 ? RCATS: Transportation to medical appointments. 989-324-2099 ? Social Security Administration: May apply for disability if have a Stage IV cancer. ([phone] 220-454-4268 ? [employer], Disability and Transit Services: Assists with nutrition, care and transit needs. [place] Support Programs: @10RELATIVEDAYS @ > Cancer Support Group  2nd Tuesday of the month 1pm-2pm, Journey Room  > Creative Journey  3rd Tuesday of the month 1130am-1pm, Journey Room  > Look Good Feel Better  1st Wednesday of the month 10am-12 noon, Journey Room (Call [facility] to register [phone])

## 2017-03-10 NOTE — Treatment Plan (Signed)
Reducing 25 % today only for crcl of ~ 50 per Kirby Crigler

## 2017-03-10 NOTE — Patient Instructions (Signed)
[facility] Discharge Instructions for Patients Receiving Chemotherapy   Beginning January 23rd 2017 lab work for the [facility] will be done in the  Main lab at [facility] on 1st floor. If you have a lab appointment with the [facility] please come in thru the  Main Entrance and check in at the main information desk   Today you received the following chemotherapy agents cisplatin and gemzar.  To help prevent nausea and vomiting after your treatment, we encourage you to take your nausea medication as instructed.   If you develop nausea and vomiting, or diarrhea that is not controlled by your medication, call the clinic.  The clinic phone number is [phone]. Office hours are Monday-Friday 8:30am-5:00pm.  BELOW ARE SYMPTOMS THAT SHOULD BE REPORTED IMMEDIATELY:  *FEVER GREATER THAN 101.0 F  *CHILLS WITH OR WITHOUT FEVER  NAUSEA AND VOMITING THAT IS NOT CONTROLLED WITH YOUR NAUSEA MEDICATION  *UNUSUAL SHORTNESS OF BREATH  *UNUSUAL BRUISING OR BLEEDING  TENDERNESS IN MOUTH AND THROAT WITH OR WITHOUT PRESENCE OF ULCERS  *URINARY PROBLEMS  *BOWEL PROBLEMS  UNUSUAL RASH Items with * indicate a potential emergency and should be followed up as soon as possible. If you have an emergency after office hours please contact your primary care physician or go to the nearest emergency department.  Please call the clinic during office hours if you have any questions or concerns.   You may also contact the Patient Navigator at [phone] should you have any questions or need assistance in obtaining follow up care.      Resources For Cancer Patients and their Caregivers ? American Cancer Society: Can assist with transportation, wigs, general needs, runs Look Good Feel Better.        ([phone] 688-8801 ? Cancer Care: Provides financial assistance, online support groups, medication/co-pay assistance.  1-800-813-HOPE 747-562-5271) ? [facility] Assists [place] cancer patients and their families through emotional , educational and financial support.  (865) 863-1562 ? [place] DSS Where to apply for food stamps, Medicaid and utility assistance. 825-571-8840 ? RCATS: Transportation to medical appointments. 980 883 9273 ? Social Security Administration: May apply for disability if have a Stage IV cancer. ([phone] 148-4069 319-222-7621 ? [employer], Disability and Transit Services: Assists with nutrition, care and transit needs. (669)482-1121

## 2017-03-10 NOTE — Progress Notes (Signed)
Tolerated chemo well. Stable and ambulatory on discharge home with wife. 

## 2017-03-17 ENCOUNTER — Ambulatory Visit (HOSPITAL_COMMUNITY): Payer: Medicare HMO

## 2017-03-17 ENCOUNTER — Other Ambulatory Visit (HOSPITAL_COMMUNITY): Payer: Self-pay | Admitting: Oncology

## 2017-03-17 ENCOUNTER — Encounter (HOSPITAL_BASED_OUTPATIENT_CLINIC_OR_DEPARTMENT_OTHER): Payer: Medicare HMO

## 2017-03-17 VITALS — BP 145/66 | HR 76 | Temp 98.1°F | Resp 18 | Wt 169.0 lb

## 2017-03-17 DIAGNOSIS — C679 Malignant neoplasm of bladder, unspecified: Secondary | ICD-10-CM | POA: Diagnosis not present

## 2017-03-17 DIAGNOSIS — Z5111 Encounter for antineoplastic chemotherapy: Secondary | ICD-10-CM | POA: Diagnosis not present

## 2017-03-17 LAB — CBC WITH DIFFERENTIAL/PLATELET
BASOS ABS: 0 10*3/uL (ref 0.0–0.1)
Basophils Relative: 0 %
Eosinophils Absolute: 0.1 10*3/uL (ref 0.0–0.7)
Eosinophils Relative: 1 %
HEMATOCRIT: 32.9 % — AB (ref 39.0–52.0)
Hemoglobin: 11 g/dL — ABNORMAL LOW (ref 13.0–17.0)
LYMPHS PCT: 33 %
Lymphs Abs: 2.4 10*3/uL (ref 0.7–4.0)
MCH: 31.7 pg (ref 26.0–34.0)
MCHC: 33.4 g/dL (ref 30.0–36.0)
MCV: 94.8 fL (ref 78.0–100.0)
MONO ABS: 0.4 10*3/uL (ref 0.1–1.0)
Monocytes Relative: 6 %
NEUTROS ABS: 4.3 10*3/uL (ref 1.7–7.7)
Neutrophils Relative %: 60 %
Platelets: 186 10*3/uL (ref 150–400)
RBC: 3.47 MIL/uL — AB (ref 4.22–5.81)
RDW: 16.9 % — AB (ref 11.5–15.5)
WBC: 7.3 10*3/uL (ref 4.0–10.5)

## 2017-03-17 LAB — COMPREHENSIVE METABOLIC PANEL
ALT: 15 U/L — AB (ref 17–63)
AST: 20 U/L (ref 15–41)
Albumin: 3.6 g/dL (ref 3.5–5.0)
Alkaline Phosphatase: 83 U/L (ref 38–126)
Anion gap: 9 (ref 5–15)
BUN: 19 mg/dL (ref 6–20)
CO2: 24 mmol/L (ref 22–32)
CREATININE: 1.52 mg/dL — AB (ref 0.61–1.24)
Calcium: 8.6 mg/dL — ABNORMAL LOW (ref 8.9–10.3)
Chloride: 103 mmol/L (ref 101–111)
GFR, EST AFRICAN AMERICAN: 51 mL/min — AB (ref >60)
GFR, EST NON AFRICAN AMERICAN: 44 mL/min — AB (ref >60)
Glucose, Bld: 185 mg/dL — ABNORMAL HIGH (ref 65–99)
Potassium: 3.8 mmol/L (ref 3.5–5.1)
Sodium: 136 mmol/L (ref 135–145)
TOTAL PROTEIN: 6.9 g/dL (ref 6.5–8.1)
Total Bilirubin: 0.4 mg/dL (ref 0.3–1.2)

## 2017-03-17 LAB — MAGNESIUM: MAGNESIUM: 1.5 mg/dL — AB (ref 1.7–2.4)

## 2017-03-17 MED ORDER — PROCHLORPERAZINE MALEATE 10 MG PO TABS
10.0000 mg | ORAL_TABLET | Freq: Once | ORAL | Status: AC
  Administered 2017-03-17: 10 mg via ORAL

## 2017-03-17 MED ORDER — HEPARIN SOD (PORK) LOCK FLUSH 100 UNIT/ML IV SOLN
500.0000 [IU] | Freq: Once | INTRAVENOUS | Status: AC | PRN
  Administered 2017-03-17: 500 [IU]
  Filled 2017-03-17: qty 5

## 2017-03-17 MED ORDER — SODIUM CHLORIDE 0.9% FLUSH
10.0000 mL | INTRAVENOUS | Status: DC | PRN
  Administered 2017-03-17: 10 mL
  Filled 2017-03-17: qty 10

## 2017-03-17 MED ORDER — GEMCITABINE HCL CHEMO INJECTION 1 GM/26.3ML
800.0000 mg/m2 | Freq: Once | INTRAVENOUS | Status: AC
  Administered 2017-03-17: 1558 mg via INTRAVENOUS
  Filled 2017-03-17: qty 15.78

## 2017-03-17 MED ORDER — PROCHLORPERAZINE MALEATE 10 MG PO TABS
ORAL_TABLET | ORAL | Status: AC
  Filled 2017-03-17: qty 1

## 2017-03-17 MED ORDER — MAGNESIUM SULFATE 2 GM/50ML IV SOLN
2.0000 g | Freq: Once | INTRAVENOUS | Status: AC
  Administered 2017-03-17: 2 g via INTRAVENOUS
  Filled 2017-03-17: qty 50

## 2017-03-17 MED ORDER — SODIUM CHLORIDE 0.9 % IV SOLN
Freq: Once | INTRAVENOUS | Status: AC
  Administered 2017-03-17: 12:00:00 via INTRAVENOUS

## 2017-03-17 NOTE — Progress Notes (Signed)
Labs printed and reviewed with Robynn Pane, [name].  Instructed to treat today as well as add 2mg  Magnesium IV.  Patient tolerated infusion well.  VSS.  Patient ambulatory and stable upon discharge from clinic.

## 2017-03-17 NOTE — Patient Instructions (Signed)
[facility] Discharge Instructions for Patients Receiving Chemotherapy   Beginning January 23rd 2017 lab work for the Cancer Center will be done in the  Main lab at [facility] on 1st floor. If you have a lab appointment with the Cancer Center please come in thru the  Main Entrance and check in at the main information desk   Today you received the following chemotherapy agent: Gemzar.     If you develop nausea and vomiting, or diarrhea that is not controlled by your medication, call the clinic.  The clinic phone number is [phone]. Office hours are Monday-Friday 8:30am-5:00pm.  BELOW ARE SYMPTOMS THAT SHOULD BE REPORTED IMMEDIATELY:  *FEVER GREATER THAN 101.0 F  *CHILLS WITH OR WITHOUT FEVER  NAUSEA AND VOMITING THAT IS NOT CONTROLLED WITH YOUR NAUSEA MEDICATION  *UNUSUAL SHORTNESS OF BREATH  *UNUSUAL BRUISING OR BLEEDING  TENDERNESS IN MOUTH AND THROAT WITH OR WITHOUT PRESENCE OF ULCERS  *URINARY PROBLEMS  *BOWEL PROBLEMS  UNUSUAL RASH Items with * indicate a potential emergency and should be followed up as soon as possible. If you have an emergency after office hours please contact your primary care physician or go to the nearest emergency department.  Please call the clinic during office hours if you have any questions or concerns.   You may also contact the Patient Navigator at [phone] should you have any questions or need assistance in obtaining follow up care.      Resources For Cancer Patients and their Caregivers ? American Cancer Society: Can assist with transportation, wigs, general needs, runs Look Good Feel Better.        1-888-227-6333 ? Cancer Care: Provides financial assistance, online support groups, medication/co-pay assistance.  1-800-813-HOPE (4673) ? [facility] Assists [place] cancer patients and their families through emotional , educational and financial support.   336-427-4357 ? [place] DSS Where to apply for food stamps, Medicaid and utility assistance. 336-342-1394 ? [employer]ATS: Transportation to medical appointments. 336-347-2287 ? Social Security Administration: May apply for disability if have a Stage IV cancer. 336-342-7796 1-800-772-1213 ? [place] A[facility], Disability and Transit Services: Assists with nutrition, care and transit needs. 336-349-2343          

## 2017-03-21 ENCOUNTER — Other Ambulatory Visit (HOSPITAL_COMMUNITY): Payer: Self-pay

## 2017-03-21 DIAGNOSIS — C679 Malignant neoplasm of bladder, unspecified: Secondary | ICD-10-CM

## 2017-03-21 MED ORDER — OXYBUTYNIN CHLORIDE 5 MG PO TABS
5.0000 mg | ORAL_TABLET | Freq: Three times a day (TID) | ORAL | 1 refills | Status: DC | PRN
Start: 2017-03-21 — End: 2017-04-15

## 2017-03-21 NOTE — Telephone Encounter (Signed)
Received refill request from patients pharmacy for Oxybutyin. Reviewed with MD, chart checked and refilled.

## 2017-03-24 ENCOUNTER — Encounter (HOSPITAL_BASED_OUTPATIENT_CLINIC_OR_DEPARTMENT_OTHER): Payer: Medicare HMO

## 2017-03-24 ENCOUNTER — Encounter (HOSPITAL_COMMUNITY): Payer: Self-pay

## 2017-03-24 DIAGNOSIS — C679 Malignant neoplasm of bladder, unspecified: Secondary | ICD-10-CM | POA: Diagnosis not present

## 2017-03-24 LAB — CBC WITH DIFFERENTIAL/PLATELET
BASOS PCT: 0 %
Basophils Absolute: 0 10*3/uL (ref 0.0–0.1)
EOS ABS: 0 10*3/uL (ref 0.0–0.7)
EOS PCT: 1 %
HCT: 27.8 % — ABNORMAL LOW (ref 39.0–52.0)
Hemoglobin: 9.5 g/dL — ABNORMAL LOW (ref 13.0–17.0)
Lymphocytes Relative: 48 %
Lymphs Abs: 2.1 10*3/uL (ref 0.7–4.0)
MCH: 32.3 pg (ref 26.0–34.0)
MCHC: 34.2 g/dL (ref 30.0–36.0)
MCV: 94.6 fL (ref 78.0–100.0)
MONO ABS: 0.5 10*3/uL (ref 0.1–1.0)
MONOS PCT: 12 %
Neutro Abs: 1.7 10*3/uL (ref 1.7–7.7)
Neutrophils Relative %: 40 %
Platelets: 57 10*3/uL — ABNORMAL LOW (ref 150–400)
RBC: 2.94 MIL/uL — ABNORMAL LOW (ref 4.22–5.81)
RDW: 16.6 % — AB (ref 11.5–15.5)
WBC: 4.3 10*3/uL (ref 4.0–10.5)

## 2017-03-24 LAB — COMPREHENSIVE METABOLIC PANEL
ALBUMIN: 3.6 g/dL (ref 3.5–5.0)
ALT: 11 U/L — ABNORMAL LOW (ref 17–63)
ANION GAP: 8 (ref 5–15)
AST: 16 U/L (ref 15–41)
Alkaline Phosphatase: 80 U/L (ref 38–126)
BILIRUBIN TOTAL: 0.4 mg/dL (ref 0.3–1.2)
BUN: 15 mg/dL (ref 6–20)
CO2: 23 mmol/L (ref 22–32)
Calcium: 8.9 mg/dL (ref 8.9–10.3)
Chloride: 104 mmol/L (ref 101–111)
Creatinine, Ser: 1.43 mg/dL — ABNORMAL HIGH (ref 0.61–1.24)
GFR calc Af Amer: 55 mL/min — ABNORMAL LOW (ref >60)
GFR calc non Af Amer: 48 mL/min — ABNORMAL LOW (ref >60)
GLUCOSE: 99 mg/dL (ref 65–99)
Potassium: 4.4 mmol/L (ref 3.5–5.1)
SODIUM: 135 mmol/L (ref 135–145)
TOTAL PROTEIN: 6.7 g/dL (ref 6.5–8.1)

## 2017-03-24 LAB — MAGNESIUM: MAGNESIUM: 1.6 mg/dL — AB (ref 1.7–2.4)

## 2017-03-24 MED ORDER — HEPARIN SOD (PORK) LOCK FLUSH 100 UNIT/ML IV SOLN
INTRAVENOUS | Status: AC
  Filled 2017-03-24: qty 5

## 2017-03-24 MED ORDER — ZOLPIDEM TARTRATE 5 MG PO TABS: 5.0000 mg | ORAL_TABLET | Freq: Every evening | ORAL | 0 refills | Status: DC | PRN

## 2017-03-24 MED ORDER — HEPARIN SOD (PORK) LOCK FLUSH 100 UNIT/ML IV SOLN
500.0000 [IU] | Freq: Once | INTRAVENOUS | Status: AC
  Administered 2017-03-24: 500 [IU] via INTRAVENOUS

## 2017-03-24 NOTE — Patient Instructions (Signed)
[facility] Discharge Instructions for Patients Receiving Chemotherapy   Beginning January 23rd 2017 lab work for the Cancer Center will be done in the  Main lab at [facility] on 1st floor. If you have a lab appointment with the Cancer Center please come in thru the  Main Entrance and check in at the main information desk   Today you received the following chemotherapy agents   To help prevent nausea and vomiting after your treatment, we encourage you to take your nausea medication     If you develop nausea and vomiting, or diarrhea that is not controlled by your medication, call the clinic.  The clinic phone number is [phone]. Office hours are Monday-Friday 8:30am-5:00pm.  BELOW ARE SYMPTOMS THAT SHOULD BE REPORTED IMMEDIATELY:  *FEVER GREATER THAN 101.0 F  *CHILLS WITH OR WITHOUT FEVER  NAUSEA AND VOMITING THAT IS NOT CONTROLLED WITH YOUR NAUSEA MEDICATION  *UNUSUAL SHORTNESS OF BREATH  *UNUSUAL BRUISING OR BLEEDING  TENDERNESS IN MOUTH AND THROAT WITH OR WITHOUT PRESENCE OF ULCERS  *URINARY PROBLEMS  *BOWEL PROBLEMS  UNUSUAL RASH Items with * indicate a potential emergency and should be followed up as soon as possible. If you have an emergency after office hours please contact your primary care physician or go to the nearest emergency department.  Please call the clinic during office hours if you have any questions or concerns.   You may also contact the Patient Navigator at [phone] should you have any questions or need assistance in obtaining follow up care.      Resources For Cancer Patients and their Caregivers ? American Cancer Society: Can assist with transportation, wigs, general needs, runs Look Good Feel Better.        1-888-227-6333 ? Cancer Care: Provides financial assistance, online support groups, medication/co-pay assistance.  1-800-813-HOPE (4673) ? [facility] Assists [place] cancer  patients and their families through emotional , educational and financial support.  336-427-4357 ? [place] DSS Where to apply for food stamps, Medicaid and utility assistance. 336-342-1394 ? [facility]: Transportation to medical appointments. 336-347-2287 ? Social Security Administration: May apply for disability if have a Stage IV cancer. 336-342-7796 1-800-772-1213 ? [place] Aging, Disability and Transit Services: Assists with nutrition, care and transit needs. 336-349-2343         

## 2017-03-24 NOTE — Progress Notes (Signed)
Chemotherapy held today due to low platelets, per MD.

## 2017-03-27 ENCOUNTER — Telehealth (HOSPITAL_COMMUNITY): Payer: Self-pay

## 2017-03-27 NOTE — Telephone Encounter (Signed)
Patient called stating he still has nausea and his legs are weak. He did not have chemo this past week. Explained to patient that chemotherapy is cumulative, the more you get the longer it takes to get over. Patient states his nausea is controlled with medication. He stated he has no taste for any food but is making himself eat. Explained that was from the chemo also. He needs to eat and not lose weight. Patient verbalized understanding of above.

## 2017-03-31 ENCOUNTER — Encounter (HOSPITAL_COMMUNITY): Payer: Self-pay

## 2017-03-31 ENCOUNTER — Encounter (HOSPITAL_BASED_OUTPATIENT_CLINIC_OR_DEPARTMENT_OTHER): Payer: Medicare HMO

## 2017-03-31 VITALS — BP 156/58 | HR 77 | Temp 98.4°F | Resp 18 | Wt 172.4 lb

## 2017-03-31 DIAGNOSIS — Z5111 Encounter for antineoplastic chemotherapy: Secondary | ICD-10-CM | POA: Diagnosis not present

## 2017-03-31 DIAGNOSIS — C679 Malignant neoplasm of bladder, unspecified: Secondary | ICD-10-CM | POA: Diagnosis not present

## 2017-03-31 LAB — CBC WITH DIFFERENTIAL/PLATELET
BASOS PCT: 0 %
Basophils Absolute: 0 10*3/uL (ref 0.0–0.1)
Eosinophils Absolute: 0.1 10*3/uL (ref 0.0–0.7)
Eosinophils Relative: 2 %
HEMATOCRIT: 30.2 % — AB (ref 39.0–52.0)
Hemoglobin: 9.9 g/dL — ABNORMAL LOW (ref 13.0–17.0)
LYMPHS PCT: 28 %
Lymphs Abs: 2 10*3/uL (ref 0.7–4.0)
MCH: 32.5 pg (ref 26.0–34.0)
MCHC: 32.8 g/dL (ref 30.0–36.0)
MCV: 99 fL (ref 78.0–100.0)
MONO ABS: 0.8 10*3/uL (ref 0.1–1.0)
Monocytes Relative: 11 %
NEUTROS ABS: 4.4 10*3/uL (ref 1.7–7.7)
Neutrophils Relative %: 59 %
Platelets: 453 10*3/uL — ABNORMAL HIGH (ref 150–400)
RBC: 3.05 MIL/uL — ABNORMAL LOW (ref 4.22–5.81)
RDW: 18 % — AB (ref 11.5–15.5)
WBC: 7.4 10*3/uL (ref 4.0–10.5)

## 2017-03-31 LAB — COMPREHENSIVE METABOLIC PANEL
ALT: 9 U/L — ABNORMAL LOW (ref 17–63)
ANION GAP: 7 (ref 5–15)
AST: 16 U/L (ref 15–41)
Albumin: 3.5 g/dL (ref 3.5–5.0)
Alkaline Phosphatase: 90 U/L (ref 38–126)
BILIRUBIN TOTAL: 0.4 mg/dL (ref 0.3–1.2)
BUN: 18 mg/dL (ref 6–20)
CALCIUM: 8.7 mg/dL — AB (ref 8.9–10.3)
CO2: 28 mmol/L (ref 22–32)
Chloride: 103 mmol/L (ref 101–111)
Creatinine, Ser: 1.6 mg/dL — ABNORMAL HIGH (ref 0.61–1.24)
GFR calc Af Amer: 48 mL/min — ABNORMAL LOW (ref >60)
GFR, EST NON AFRICAN AMERICAN: 42 mL/min — AB (ref >60)
Glucose, Bld: 92 mg/dL (ref 65–99)
POTASSIUM: 5.2 mmol/L — AB (ref 3.5–5.1)
Sodium: 138 mmol/L (ref 135–145)
TOTAL PROTEIN: 6.7 g/dL (ref 6.5–8.1)

## 2017-03-31 LAB — MAGNESIUM: Magnesium: 1.8 mg/dL (ref 1.7–2.4)

## 2017-03-31 MED ORDER — HEPARIN SOD (PORK) LOCK FLUSH 100 UNIT/ML IV SOLN
500.0000 [IU] | Freq: Once | INTRAVENOUS | Status: AC | PRN
  Administered 2017-03-31: 500 [IU]
  Filled 2017-03-31 (×2): qty 5

## 2017-03-31 MED ORDER — SODIUM CHLORIDE 0.9 % IV SOLN
800.0000 mg/m2 | Freq: Once | INTRAVENOUS | Status: AC
  Administered 2017-03-31: 1558 mg via INTRAVENOUS
  Filled 2017-03-31: qty 25.22

## 2017-03-31 MED ORDER — ONDANSETRON HCL 8 MG PO TABS: 8.0000 mg | ORAL_TABLET | Freq: Three times a day (TID) | ORAL | 2 refills | Status: DC | PRN

## 2017-03-31 MED ORDER — PROCHLORPERAZINE MALEATE 10 MG PO TABS
10.0000 mg | ORAL_TABLET | Freq: Once | ORAL | Status: AC
  Administered 2017-03-31: 10 mg via ORAL
  Filled 2017-03-31: qty 1

## 2017-03-31 MED ORDER — PROCHLORPERAZINE MALEATE 10 MG PO TABS: 10.0000 mg | ORAL_TABLET | Freq: Four times a day (QID) | ORAL | 2 refills | Status: DC | PRN

## 2017-03-31 MED ORDER — SODIUM CHLORIDE 0.9 % IV SOLN
Freq: Once | INTRAVENOUS | Status: AC
  Administered 2017-03-31: 13:00:00 via INTRAVENOUS

## 2017-03-31 MED ORDER — ZOLPIDEM TARTRATE 5 MG PO TABS: 5.0000 mg | ORAL_TABLET | Freq: Every evening | ORAL | 0 refills | Status: DC | PRN

## 2017-03-31 MED ORDER — ZOLPIDEM TARTRATE 10 MG PO TABS: 10.0000 mg | ORAL_TABLET | Freq: Every evening | ORAL | 0 refills | Status: DC | PRN

## 2017-03-31 NOTE — Progress Notes (Signed)
[name] made aware of lab results - okay to tx today per MD.

## 2017-03-31 NOTE — Progress Notes (Signed)
Chemotherapy given today per orders. Patient tolerated it well, no problems.  Vitals stable and discharged home from clinic ambulatory. Refilled his ambien, nausea pills.  Follow up as scheduled.

## 2017-03-31 NOTE — Patient Instructions (Signed)
[facility] Discharge Instructions for Patients Receiving Chemotherapy   Beginning January 23rd 2017 lab work for the Cancer Center will be done in the  Main lab at [facility] on 1st floor. If you have a lab appointment with the Cancer Center please come in thru the  Main Entrance and check in at the main information desk   Today you received the following chemotherapy agents   To help prevent nausea and vomiting after your treatment, we encourage you to take your nausea medication     If you develop nausea and vomiting, or diarrhea that is not controlled by your medication, call the clinic.  The clinic phone number is [phone]. Office hours are Monday-Friday 8:30am-5:00pm.  BELOW ARE SYMPTOMS THAT SHOULD BE REPORTED IMMEDIATELY:  *FEVER GREATER THAN 101.0 F  *CHILLS WITH OR WITHOUT FEVER  NAUSEA AND VOMITING THAT IS NOT CONTROLLED WITH YOUR NAUSEA MEDICATION  *UNUSUAL SHORTNESS OF BREATH  *UNUSUAL BRUISING OR BLEEDING  TENDERNESS IN MOUTH AND THROAT WITH OR WITHOUT PRESENCE OF ULCERS  *URINARY PROBLEMS  *BOWEL PROBLEMS  UNUSUAL RASH Items with * indicate a potential emergency and should be followed up as soon as possible. If you have an emergency after office hours please contact your primary care physician or go to the nearest emergency department.  Please call the clinic during office hours if you have any questions or concerns.   You may also contact the Patient Navigator at [phone] should you have any questions or need assistance in obtaining follow up care.      Resources For Cancer Patients and their Caregivers ? American Cancer Society: Can assist with transportation, wigs, general needs, runs Look Good Feel Better.        1-888-227-6333 ? Cancer Care: Provides financial assistance, online support groups, medication/co-pay assistance.  1-800-813-HOPE (4673) ? [facility] Assists [place] cancer  patients and their families through emotional , educational and financial support.  336-427-4357 ? [place] DSS Where to apply for food stamps, Medicaid and utility assistance. 336-342-1394 ? [facility]: Transportation to medical appointments. 336-347-2287 ? Social Security Administration: May apply for disability if have a Stage IV cancer. 336-342-7796 1-800-772-1213 ? [place] [facility]ging, Disability and Transit Services: Assists with nutrition, care and transit needs. 336-349-2343         

## 2017-04-07 ENCOUNTER — Ambulatory Visit (HOSPITAL_COMMUNITY): Payer: Medicare HMO | Admitting: Oncology

## 2017-04-07 ENCOUNTER — Ambulatory Visit (HOSPITAL_COMMUNITY): Payer: Medicare HMO

## 2017-04-08 ENCOUNTER — Telehealth (HOSPITAL_COMMUNITY): Payer: Self-pay

## 2017-04-08 NOTE — Telephone Encounter (Signed)
Patient called stating that he has lost his appetite since last chemo treatment. He states he is continuing to drink gatorade. Instructed patient to try boost or ensure, 2-3 cans daily.  Message sent to provider to see if there is anything else that can be done such as megace, etc. Patient verbalized understanding.

## 2017-04-08 NOTE — Telephone Encounter (Signed)
Inbasket sent to Lorrin Jackson, for nutrition consult.

## 2017-04-08 NOTE — Telephone Encounter (Signed)
Spoke with PA- C over the phone. He wants patient to go to the [facility] clinic for nutrition and social worker. Notified navigator who has tried to call patient with no luck so far. PA said we would follow nutritions recommendations regarding his decrease in appetite.

## 2017-04-08 NOTE — Telephone Encounter (Signed)
Patient cannot make MDC due to chemotherapy being same day.  Please refer to nutrition for consult.  Robynn Pane, [name] 04/08/2017 4:04 PM

## 2017-04-09 ENCOUNTER — Other Ambulatory Visit (HOSPITAL_COMMUNITY): Payer: Self-pay

## 2017-04-09 DIAGNOSIS — C679 Malignant neoplasm of bladder, unspecified: Secondary | ICD-10-CM

## 2017-04-09 NOTE — Progress Notes (Signed)
Referral entered for nutrition

## 2017-04-14 ENCOUNTER — Ambulatory Visit (HOSPITAL_COMMUNITY): Payer: Medicare HMO

## 2017-04-15 ENCOUNTER — Encounter (HOSPITAL_BASED_OUTPATIENT_CLINIC_OR_DEPARTMENT_OTHER): Payer: Medicare HMO | Admitting: Oncology

## 2017-04-15 ENCOUNTER — Encounter (HOSPITAL_COMMUNITY): Payer: Medicare HMO | Attending: Oncology

## 2017-04-15 ENCOUNTER — Encounter (HOSPITAL_COMMUNITY): Payer: Self-pay | Admitting: Oncology

## 2017-04-15 VITALS — BP 170/72 | HR 88 | Temp 98.0°F | Resp 16 | Wt 168.8 lb

## 2017-04-15 DIAGNOSIS — C679 Malignant neoplasm of bladder, unspecified: Secondary | ICD-10-CM | POA: Insufficient documentation

## 2017-04-15 DIAGNOSIS — Z720 Tobacco use: Secondary | ICD-10-CM | POA: Insufficient documentation

## 2017-04-15 DIAGNOSIS — Z5111 Encounter for antineoplastic chemotherapy: Secondary | ICD-10-CM | POA: Diagnosis not present

## 2017-04-15 DIAGNOSIS — F489 Nonpsychotic mental disorder, unspecified: Secondary | ICD-10-CM | POA: Diagnosis not present

## 2017-04-15 DIAGNOSIS — G4709 Other insomnia: Secondary | ICD-10-CM

## 2017-04-15 DIAGNOSIS — R4589 Other symptoms and signs involving emotional state: Secondary | ICD-10-CM

## 2017-04-15 LAB — CBC WITH DIFFERENTIAL/PLATELET
BASOS PCT: 0 %
Basophils Absolute: 0 10*3/uL (ref 0.0–0.1)
EOS ABS: 0.1 10*3/uL (ref 0.0–0.7)
Eosinophils Relative: 1 %
HEMATOCRIT: 34.5 % — AB (ref 39.0–52.0)
Hemoglobin: 11 g/dL — ABNORMAL LOW (ref 13.0–17.0)
LYMPHS ABS: 2 10*3/uL (ref 0.7–4.0)
Lymphocytes Relative: 25 %
MCH: 32.3 pg (ref 26.0–34.0)
MCHC: 31.9 g/dL (ref 30.0–36.0)
MCV: 101.2 fL — ABNORMAL HIGH (ref 78.0–100.0)
MONOS PCT: 9 %
Monocytes Absolute: 0.7 10*3/uL (ref 0.1–1.0)
NEUTROS ABS: 5.3 10*3/uL (ref 1.7–7.7)
NEUTROS PCT: 65 %
Platelets: 207 10*3/uL (ref 150–400)
RBC: 3.41 MIL/uL — AB (ref 4.22–5.81)
RDW: 16.7 % — ABNORMAL HIGH (ref 11.5–15.5)
WBC: 8.1 10*3/uL (ref 4.0–10.5)

## 2017-04-15 LAB — COMPREHENSIVE METABOLIC PANEL
ALBUMIN: 3.8 g/dL (ref 3.5–5.0)
ALK PHOS: 95 U/L (ref 38–126)
ALT: 12 U/L — ABNORMAL LOW (ref 17–63)
ANION GAP: 10 (ref 5–15)
AST: 23 U/L (ref 15–41)
BILIRUBIN TOTAL: 0.6 mg/dL (ref 0.3–1.2)
BUN: 12 mg/dL (ref 6–20)
CALCIUM: 9 mg/dL (ref 8.9–10.3)
CO2: 23 mmol/L (ref 22–32)
CREATININE: 1.48 mg/dL — AB (ref 0.61–1.24)
Chloride: 103 mmol/L (ref 101–111)
GFR calc Af Amer: 53 mL/min — ABNORMAL LOW (ref >60)
GFR calc non Af Amer: 46 mL/min — ABNORMAL LOW (ref >60)
GLUCOSE: 162 mg/dL — AB (ref 65–99)
Potassium: 4.5 mmol/L (ref 3.5–5.1)
SODIUM: 136 mmol/L (ref 135–145)
TOTAL PROTEIN: 7.1 g/dL (ref 6.5–8.1)

## 2017-04-15 LAB — MAGNESIUM: Magnesium: 1.7 mg/dL (ref 1.7–2.4)

## 2017-04-15 MED ORDER — SODIUM CHLORIDE 0.9 % IV SOLN
Freq: Once | INTRAVENOUS | Status: AC
  Administered 2017-04-15: 11:00:00 via INTRAVENOUS

## 2017-04-15 MED ORDER — ONDANSETRON HCL 8 MG PO TABS: 8.0000 mg | ORAL_TABLET | Freq: Three times a day (TID) | ORAL | 2 refills | Status: DC | PRN

## 2017-04-15 MED ORDER — POTASSIUM CHLORIDE 2 MEQ/ML IV SOLN
Freq: Once | INTRAVENOUS | Status: AC
  Administered 2017-04-15: 09:00:00 via INTRAVENOUS
  Filled 2017-04-15: qty 10

## 2017-04-15 MED ORDER — SODIUM CHLORIDE 0.9 % IV SOLN
51.0000 mg/m2 | Freq: Once | INTRAVENOUS | Status: AC
  Administered 2017-04-15: 100 mg via INTRAVENOUS
  Filled 2017-04-15: qty 100

## 2017-04-15 MED ORDER — PROCHLORPERAZINE MALEATE 10 MG PO TABS: 10.0000 mg | ORAL_TABLET | Freq: Four times a day (QID) | ORAL | 2 refills | Status: DC | PRN

## 2017-04-15 MED ORDER — SODIUM CHLORIDE 0.9 % IV SOLN
800.0000 mg/m2 | Freq: Once | INTRAVENOUS | Status: AC
  Administered 2017-04-15: 1558 mg via INTRAVENOUS
  Filled 2017-04-15: qty 15.78

## 2017-04-15 MED ORDER — LIDOCAINE-PRILOCAINE 2.5-2.5 % EX CREA: TOPICAL_CREAM | CUTANEOUS | 2 refills | Status: DC

## 2017-04-15 MED ORDER — OXYBUTYNIN CHLORIDE 5 MG PO TABS: 5.0000 mg | ORAL_TABLET | Freq: Three times a day (TID) | ORAL | 1 refills | Status: DC | PRN

## 2017-04-15 MED ORDER — SODIUM CHLORIDE 0.9 % IV SOLN
Freq: Once | INTRAVENOUS | Status: AC
  Administered 2017-04-15: 11:00:00 via INTRAVENOUS
  Filled 2017-04-15: qty 5

## 2017-04-15 MED ORDER — HEPARIN SOD (PORK) LOCK FLUSH 100 UNIT/ML IV SOLN
500.0000 [IU] | Freq: Once | INTRAVENOUS | Status: AC | PRN
Start: 2017-04-15 — End: 2017-04-15
  Administered 2017-04-15: 500 [IU]

## 2017-04-15 MED ORDER — SODIUM CHLORIDE 0.9% FLUSH
10.0000 mL | INTRAVENOUS | Status: DC | PRN
  Administered 2017-04-15: 10 mL
  Filled 2017-04-15: qty 10

## 2017-04-15 MED ORDER — ESCITALOPRAM OXALATE 10 MG PO TABS: 10.0000 mg | ORAL_TABLET | Freq: Every day | ORAL | 1 refills | Status: DC

## 2017-04-15 MED ORDER — ZOLPIDEM TARTRATE 10 MG PO TABS: 10.0000 mg | ORAL_TABLET | Freq: Every evening | ORAL | 1 refills | Status: DC | PRN

## 2017-04-15 MED ORDER — PALONOSETRON HCL INJECTION 0.25 MG/5ML
0.2500 mg | Freq: Once | INTRAVENOUS | Status: AC
  Administered 2017-04-15: 0.25 mg via INTRAVENOUS
  Filled 2017-04-15: qty 5

## 2017-04-15 NOTE — Progress Notes (Signed)
277mls of urine voided.  Chemotherapy given today per orders. Patient tolerated it well, no issues. Vitals stable and discharged home from clinic ambulatory. Follow up as scheduled.

## 2017-04-15 NOTE — Assessment & Plan Note (Signed)
Improved with Ambien.
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
## 2017-04-15 NOTE — Assessment & Plan Note (Addendum)
Ongoing tobacco abuse.  Smoking cessation education and encouragement provided.  He continues to work on this and with excellent patient effort, he has decreased his tobacco abuse significantly.

## 2017-04-15 NOTE — Assessment & Plan Note (Signed)
Improved with low-dose Lexapro, 10 mg daily

## 2017-04-15 NOTE — Patient Instructions (Signed)
[facility] Discharge Instructions for Patients Receiving Chemotherapy   Beginning January 23rd 2017 lab work for the Cancer Center will be done in the  Main lab at  on 1st floor. If you have a lab appointment with the Cancer Center please come in thru the  Main Entrance and check in at the main information desk   Today you received the following chemotherapy agents   To help prevent nausea and vomiting after your treatment, we encourage you to take your nausea medication     If you develop nausea and vomiting, or diarrhea that is not controlled by your medication, call the clinic.  The clinic phone number is [phone]. Office hours are Monday-Friday 8:30am-5:00pm.  BELOW ARE SYMPTOMS THAT SHOULD BE REPORTED IMMEDIATELY:  *FEVER GREATER THAN 101.0 F  *CHILLS WITH OR WITHOUT FEVER  NAUSEA AND VOMITING THAT IS NOT CONTROLLED WITH YOUR NAUSEA MEDICATION  *UNUSUAL SHORTNESS OF BREATH  *UNUSUAL BRUISING OR BLEEDING  TENDERNESS IN MOUTH AND THROAT WITH OR WITHOUT PRESENCE OF ULCERS  *URINARY PROBLEMS  *BOWEL PROBLEMS  UNUSUAL RASH Items with * indicate a potential emergency and should be followed up as soon as possible. If you have an emergency after office hours please contact your primary care physician or go to the nearest emergency department.  Please call the clinic during office hours if you have any questions or concerns.   You may also contact the Patient Navigator at [phone] should you have any questions or need assistance in obtaining follow up care.      Resources For Cancer Patients and their Caregivers ? American Cancer Society: Can assist with transportation, wigs, general needs, runs Look Good Feel Better.        1-888-227-6333 ? Cancer Care: Provides financial assistance, online support groups, medication/co-pay assistance.  1-800-813-HOPE (4673) ? [facility] Assists [place] cancer  patients and their families through emotional , educational and financial support.  336-427-4357 ? [place] DSS Where to apply for food stamps, Medicaid and utility assistance. 336-342-1394 ? RCATS: Transportation to medical appointments. 336-347-2287 ? Social Security Administration: May apply for disability if have a Stage IV cancer. 336-342-7796 1-800-772-1213 ? [place] A[facility], Disability and Transit Services: Assists with nutrition, care and transit needs. 336-349-2343         

## 2017-04-15 NOTE — Assessment & Plan Note (Addendum)
Invasive urothelial carcinoma (pT2N0) with TURBT by [name] on 11/19/2016 and epirubicin intra-bladder treatment BUT with concerns for clinically early T3 disease.  Undergoing systemic chemotherapy with cisplatin/gemcitabine beginning on 12/30/2016.  Plan is to undergo 4 cycles of therapy followed by consideration for cystoprostatectomy.  Treatment has been complicated by cytopenias requiring dose reduction chemotherapy and delay and treatments.  Oncology history is updated.  Labs today: CBC diff, CMET, magnesium.  I personally reviewed and went over laboratory results with the patient.  The results are noted within this dictation.  Labs satisfy treatment parameters today.  Nursing will monitor patient closely during chemotherapy infusion per chemotherapy protocol to monitor for acute side effects/toxicities requiring immediate intervention.  Pre-treatment labs weekly while on treatment: CBC diff, CMET, magnesium.  His wife accidentally threw away his medications as they have been placed in a grocery bag for transportation.  As result, he needs refills on all of his medications that we provide him: Lexapro EMLA cream Zofran Ditropan Compazine Ambien  Return in 2 weeks for follow-up which will be day 15 of cycle #4.  At that time, we will set him up for restaging CT scans and referral back to urology, [name], for consideration of cystoprostatectomy.

## 2017-04-15 NOTE — Patient Instructions (Signed)
[place] at [facility] Discharge Instructions  RECOMMENDATIONS MADE BY THE CONSULTANT AND ANY TEST RESULTS WILL BE SENT TO YOUR REFERRING PHYSICIAN.  You were seen today by Kirby Crigler [name]. Refills given today on medications. Treatment if labs are good today. Return in 2 weeks for follow up.  Thank you for choosing [place] at [facility] to provide your oncology and hematology care.  To afford each patient quality time with our provider, please arrive at least 15 minutes before your scheduled appointment time.    If you have a lab appointment with the [place] please come in thru the  Main Entrance and check in at the main information desk  You need to re-schedule your appointment should you arrive 10 or more minutes late.  We strive to give you quality time with our providers, and arriving late affects you and other patients whose appointments are after yours.  Also, if you no show three or more times for appointments you may be dismissed from the clinic at the providers discretion.     Again, thank you for choosing [facility].  Our hope is that these requests will decrease the amount of time that you wait before being seen by our physicians.       _____________________________________________________________  Should you have questions after your visit to [facility], please contact our office at [phone] between the hours of 8:30 a.m. and 4:30 p.m.  Voicemails left after 4:30 p.m. will not be returned until the following business day.  For prescription refill requests, have your pharmacy contact our office.       Resources For Cancer Patients and their Caregivers ? American Cancer Society: Can assist with transportation, wigs, general needs, runs Look Good Feel Better.        619-021-3621 ? Cancer Care: Provides financial assistance, online support groups, medication/co-pay assistance.   1-800-813-HOPE 501-434-6936) ? [place] Assists [place] cancer patients and their families through emotional , educational and financial support.  678-457-1601 ? [place] DSS Where to apply for food stamps, Medicaid and utility assistance. (337)054-4255 ? RCATS: Transportation to medical appointments. 570-282-0481 ? Social Security Administration: May apply for disability if have a Stage IV cancer. 531-730-2374 (925) 282-2035 ? [employer], Disability and Transit Services: Assists with nutrition, care and transit needs. [place] Support Programs: @10RELATIVEDAYS @ > Cancer Support Group  2nd Tuesday of the month 1pm-2pm, Journey Room  > Creative Journey  3rd Tuesday of the month 1130am-1pm, Journey Room  > Look Good Feel Better  1st Wednesday of the month 10am-12 noon, Journey Room (Call [place] to register [phone])

## 2017-04-16 ENCOUNTER — Other Ambulatory Visit (HOSPITAL_COMMUNITY): Payer: Self-pay | Admitting: Pharmacist

## 2017-04-18 ENCOUNTER — Encounter (HOSPITAL_COMMUNITY): Payer: Medicare HMO

## 2017-04-18 ENCOUNTER — Encounter (HOSPITAL_COMMUNITY): Payer: Self-pay

## 2017-04-18 NOTE — Progress Notes (Signed)
Nutrition  Patient cancelled nutrition appointment for today.  Jeffery Ponce B. Zenia Resides, [place], [place] Registered Dietitian [phone] (pager)

## 2017-04-21 ENCOUNTER — Encounter (HOSPITAL_BASED_OUTPATIENT_CLINIC_OR_DEPARTMENT_OTHER): Payer: Medicare HMO

## 2017-04-21 ENCOUNTER — Encounter (HOSPITAL_COMMUNITY): Payer: Self-pay

## 2017-04-21 ENCOUNTER — Ambulatory Visit (HOSPITAL_COMMUNITY): Payer: Medicare HMO

## 2017-04-21 VITALS — BP 142/49 | HR 75 | Temp 98.4°F | Resp 18 | Wt 161.0 lb

## 2017-04-21 DIAGNOSIS — C679 Malignant neoplasm of bladder, unspecified: Secondary | ICD-10-CM | POA: Diagnosis not present

## 2017-04-21 DIAGNOSIS — Z5111 Encounter for antineoplastic chemotherapy: Secondary | ICD-10-CM | POA: Diagnosis not present

## 2017-04-21 LAB — CBC WITH DIFFERENTIAL/PLATELET
BASOS ABS: 0 10*3/uL (ref 0.0–0.1)
Basophils Relative: 0 %
EOS ABS: 0.1 10*3/uL (ref 0.0–0.7)
EOS PCT: 1 %
HCT: 31.7 % — ABNORMAL LOW (ref 39.0–52.0)
Hemoglobin: 10.7 g/dL — ABNORMAL LOW (ref 13.0–17.0)
LYMPHS PCT: 35 %
Lymphs Abs: 2.3 10*3/uL (ref 0.7–4.0)
MCH: 33.4 pg (ref 26.0–34.0)
MCHC: 33.8 g/dL (ref 30.0–36.0)
MCV: 99.1 fL (ref 78.0–100.0)
MONO ABS: 0.2 10*3/uL (ref 0.1–1.0)
Monocytes Relative: 4 %
Neutro Abs: 4 10*3/uL (ref 1.7–7.7)
Neutrophils Relative %: 60 %
PLATELETS: 208 10*3/uL (ref 150–400)
RBC: 3.2 MIL/uL — AB (ref 4.22–5.81)
RDW: 15.7 % — AB (ref 11.5–15.5)
WBC: 6.6 10*3/uL (ref 4.0–10.5)

## 2017-04-21 LAB — COMPREHENSIVE METABOLIC PANEL
ALT: 24 U/L (ref 17–63)
AST: 22 U/L (ref 15–41)
Albumin: 3.8 g/dL (ref 3.5–5.0)
Alkaline Phosphatase: 79 U/L (ref 38–126)
Anion gap: 6 (ref 5–15)
BUN: 28 mg/dL — ABNORMAL HIGH (ref 6–20)
CHLORIDE: 102 mmol/L (ref 101–111)
CO2: 26 mmol/L (ref 22–32)
Calcium: 8.8 mg/dL — ABNORMAL LOW (ref 8.9–10.3)
Creatinine, Ser: 1.82 mg/dL — ABNORMAL HIGH (ref 0.61–1.24)
GFR, EST AFRICAN AMERICAN: 41 mL/min — AB (ref >60)
GFR, EST NON AFRICAN AMERICAN: 36 mL/min — AB (ref >60)
Glucose, Bld: 99 mg/dL (ref 65–99)
POTASSIUM: 4.8 mmol/L (ref 3.5–5.1)
SODIUM: 134 mmol/L — AB (ref 135–145)
Total Bilirubin: 0.5 mg/dL (ref 0.3–1.2)
Total Protein: 6.6 g/dL (ref 6.5–8.1)

## 2017-04-21 LAB — MAGNESIUM: MAGNESIUM: 1.7 mg/dL (ref 1.7–2.4)

## 2017-04-21 MED ORDER — PROCHLORPERAZINE MALEATE 10 MG PO TABS
10.0000 mg | ORAL_TABLET | Freq: Once | ORAL | Status: AC
  Administered 2017-04-21: 10 mg via ORAL

## 2017-04-21 MED ORDER — SODIUM CHLORIDE 0.9 % IV SOLN
800.0000 mg/m2 | Freq: Once | INTRAVENOUS | Status: AC
  Administered 2017-04-21: 1558 mg via INTRAVENOUS
  Filled 2017-04-21: qty 15.76

## 2017-04-21 MED ORDER — HEPARIN SOD (PORK) LOCK FLUSH 100 UNIT/ML IV SOLN
500.0000 [IU] | Freq: Once | INTRAVENOUS | Status: AC | PRN
  Administered 2017-04-21: 500 [IU]
  Filled 2017-04-21: qty 5

## 2017-04-21 MED ORDER — PROCHLORPERAZINE MALEATE 10 MG PO TABS
ORAL_TABLET | ORAL | Status: AC
  Filled 2017-04-21: qty 1

## 2017-04-21 MED ORDER — SODIUM CHLORIDE 0.9 % IV SOLN
INTRAVENOUS | Status: DC
  Administered 2017-04-21: 13:00:00 via INTRAVENOUS

## 2017-04-21 NOTE — Progress Notes (Signed)
Lab results reviewed with [name].  Okay to tx today per MD.  Order rec'd for NS 500 ml bolus.   Tolerated infusions w/o adverse reaction.  Alert, in no distress.  VSS.  Discharged ambulatory.

## 2017-04-21 NOTE — Patient Instructions (Signed)
[place] at [facility] Discharge Instructions  RECOMMENDATIONS MADE BY THE CONSULTANT AND ANY TEST RESULTS WILL BE SENT TO YOUR REFERRING PHYSICIAN.  Today you received the chemotherapy Gemzar. You also were given IV fluids (normal saline 500 ml). Spoke with Raynelle Highland at [facility] - she will be getting you your Ensure. Return next week as scheduled.  Call the clinic should you have any questions or concerns.   Thank you for choosing [place] at [facility] to provide your oncology and hematology care.  To afford each patient quality time with our provider, please arrive at least 15 minutes before your scheduled appointment time.    If you have a lab appointment with the [place] please come in thru the  Main Entrance and check in at the main information desk  You need to re-schedule your appointment should you arrive 10 or more minutes late.  We strive to give you quality time with our providers, and arriving late affects you and other patients whose appointments are after yours.  Also, if you no show three or more times for appointments you may be dismissed from the clinic at the providers discretion.     Again, thank you for choosing [facility].  Our hope is that these requests will decrease the amount of time that you wait before being seen by our physicians.       _____________________________________________________________  Should you have questions after your visit to [facility], please contact our office at [phone] between the hours of 8:30 a.m. and 4:30 p.m.  Voicemails left after 4:30 p.m. will not be returned until the following business day.  For prescription refill requests, have your pharmacy contact our office.       Resources For Cancer Patients and their Caregivers ? American Cancer Society: Can assist with transportation, wigs, general needs, runs Look Good Feel  Better.        8583709721 ? Cancer Care: Provides financial assistance, online support groups, medication/co-pay assistance.  1-800-813-HOPE 848-659-7048) ? [place] Assists [place] cancer patients and their families through emotional , educational and financial support.  (215) 690-2337 ? [place] DSS Where to apply for food stamps, Medicaid and utility assistance. (929)309-5381 ? RCATS: Transportation to medical appointments. 318-126-9767 ? Social Security Administration: May apply for disability if have a Stage IV cancer. 615-480-0864 (986)198-3497 ? [employer], Disability and Transit Services: Assists with nutrition, care and transit needs. [place] Support Programs: @10RELATIVEDAYS @ > Cancer Support Group  2nd Tuesday of the month 1pm-2pm, Journey Room  > Creative Journey  3rd Tuesday of the month 1130am-1pm, Journey Room  > Look Good Feel Better  1st Wednesday of the month 10am-12 noon, Journey Room (Call [place] to register [phone])

## 2017-04-28 ENCOUNTER — Encounter (HOSPITAL_COMMUNITY): Payer: Self-pay

## 2017-04-28 ENCOUNTER — Encounter (HOSPITAL_BASED_OUTPATIENT_CLINIC_OR_DEPARTMENT_OTHER): Payer: Medicare HMO

## 2017-04-28 ENCOUNTER — Encounter (HOSPITAL_BASED_OUTPATIENT_CLINIC_OR_DEPARTMENT_OTHER): Payer: Medicare HMO | Admitting: Oncology

## 2017-04-28 DIAGNOSIS — D696 Thrombocytopenia, unspecified: Secondary | ICD-10-CM | POA: Diagnosis not present

## 2017-04-28 DIAGNOSIS — G4709 Other insomnia: Secondary | ICD-10-CM

## 2017-04-28 DIAGNOSIS — F489 Nonpsychotic mental disorder, unspecified: Secondary | ICD-10-CM | POA: Diagnosis not present

## 2017-04-28 DIAGNOSIS — C679 Malignant neoplasm of bladder, unspecified: Secondary | ICD-10-CM | POA: Diagnosis not present

## 2017-04-28 DIAGNOSIS — Z720 Tobacco use: Secondary | ICD-10-CM

## 2017-04-28 DIAGNOSIS — R4589 Other symptoms and signs involving emotional state: Secondary | ICD-10-CM

## 2017-04-28 LAB — CBC WITH DIFFERENTIAL/PLATELET
Basophils Absolute: 0 10*3/uL (ref 0.0–0.1)
Basophils Relative: 0 %
Eosinophils Absolute: 0 10*3/uL (ref 0.0–0.7)
Eosinophils Relative: 0 %
HEMATOCRIT: 27.7 % — AB (ref 39.0–52.0)
HEMOGLOBIN: 9.4 g/dL — AB (ref 13.0–17.0)
LYMPHS ABS: 2.3 10*3/uL (ref 0.7–4.0)
Lymphocytes Relative: 51 %
MCH: 33.3 pg (ref 26.0–34.0)
MCHC: 33.9 g/dL (ref 30.0–36.0)
MCV: 98.2 fL (ref 78.0–100.0)
MONOS PCT: 11 %
Monocytes Absolute: 0.5 10*3/uL (ref 0.1–1.0)
NEUTROS ABS: 1.7 10*3/uL (ref 1.7–7.7)
NEUTROS PCT: 38 %
Platelets: 68 10*3/uL — ABNORMAL LOW (ref 150–400)
RBC: 2.82 MIL/uL — ABNORMAL LOW (ref 4.22–5.81)
RDW: 15.1 % (ref 11.5–15.5)
WBC: 4.6 10*3/uL (ref 4.0–10.5)

## 2017-04-28 LAB — COMPREHENSIVE METABOLIC PANEL
ALBUMIN: 3.6 g/dL (ref 3.5–5.0)
ALT: 12 U/L — ABNORMAL LOW (ref 17–63)
ANION GAP: 6 (ref 5–15)
AST: 15 U/L (ref 15–41)
Alkaline Phosphatase: 76 U/L (ref 38–126)
BUN: 21 mg/dL — ABNORMAL HIGH (ref 6–20)
CHLORIDE: 106 mmol/L (ref 101–111)
CO2: 25 mmol/L (ref 22–32)
Calcium: 9 mg/dL (ref 8.9–10.3)
Creatinine, Ser: 1.39 mg/dL — ABNORMAL HIGH (ref 0.61–1.24)
GFR, EST AFRICAN AMERICAN: 57 mL/min — AB (ref >60)
GFR, EST NON AFRICAN AMERICAN: 49 mL/min — AB (ref >60)
Glucose, Bld: 96 mg/dL (ref 65–99)
POTASSIUM: 4.4 mmol/L (ref 3.5–5.1)
Sodium: 137 mmol/L (ref 135–145)
TOTAL PROTEIN: 6.6 g/dL (ref 6.5–8.1)
Total Bilirubin: 0.5 mg/dL (ref 0.3–1.2)

## 2017-04-28 LAB — MAGNESIUM: MAGNESIUM: 1.9 mg/dL (ref 1.7–2.4)

## 2017-04-28 MED ORDER — TRAZODONE HCL 50 MG PO TABS: 50.0000 mg | ORAL_TABLET | Freq: Every day | ORAL | 0 refills | Status: DC

## 2017-04-28 MED ORDER — HEPARIN SOD (PORK) LOCK FLUSH 100 UNIT/ML IV SOLN
500.0000 [IU] | Freq: Once | INTRAVENOUS | Status: AC
  Administered 2017-04-28: 500 [IU] via INTRAVENOUS

## 2017-04-28 MED ORDER — HEPARIN SOD (PORK) LOCK FLUSH 100 UNIT/ML IV SOLN
INTRAVENOUS | Status: AC
  Filled 2017-04-28: qty 5

## 2017-04-28 MED ORDER — HEPARIN SOD (PORK) LOCK FLUSH 100 UNIT/ML IV SOLN: 500.0000 [IU] | Freq: Once | INTRAVENOUS | Status: AC

## 2017-04-28 NOTE — Assessment & Plan Note (Addendum)
Invasive urothelial carcinoma (pT2N0) with TURBT by [name] on 01/21/2016 and epirubicin intra-bladder treatment BUT with concerns for clinically early T3 disease.  Undergoing systemic chemotherapy with cisplatin/gemcitabine beginning on 12/30/2016.  Lantus to undergo 4 cycles of therapy followed by consideration for cystoprostatectomy.  Treatment has been complicated by cytopenias requiring dose reduction chemotherapy and delay in treatments.  Oncology history is updated.  Labs today: CBC diff, CMET, magnesium.  I personally reviewed and went over laboratory results with the patient.  The results are noted within this dictation.  Labs DO NOT satisfy treatment parameters today.  Thrombocytopenia of 68,000 is noted today and therefore, day 15 cycle #4 is CANCELLED.  Will NOT make up this treatment date.  He is finished with chemotherapy.  Treatment plan is cancelled and discontinued.   He reports an appointment with [name], urology, on Friday, 05/02/2017.  He reports that he is due for CT imaging that day as well.  He prefers to having imaging completed at [facility], but the patient is advised that I will defer that to his urologist.  Will ask nursing to confirm patient's appointment with urologist.  He continues to have issues with insomnia.  I am not sure we can correct that medically.  He has tried Benzodiazepine (Temazepam) without benefit.  He has tried nonbenzodiazepine based hypnotic as well (Ambien) but reported side effect of sleep walking.    I will try Trazodone, 50 mg PO at HS.  If ineffective with dosing titration, then I will defer to his primary care provider.  Return in 6 weeks for follow-up.

## 2017-04-28 NOTE — Assessment & Plan Note (Signed)
On Lexapro with improvement.

## 2017-04-28 NOTE — Progress Notes (Signed)
[facility] [place] [address]  Urothelial carcinoma of bladder [facility])  Other insomnia - Plan: traZODone (DESYREL) 50 MG tablet  Tobacco abuse  [name]  CURRENT THERAPY: Cisplatin/gemcitabine days 1, 8, 15 every 28 days beginning on 12/30/2016.  INTERVAL HISTORY: Jeffery Ponce 71 y.o. male returns for followup of invasive urothelial carcinoma (pT2N0), but with clinical concerns for T3 disease.  AND Ongoing tobacco abuse.    Urothelial carcinoma of bladder ([place])   11/15/2016 Imaging    CT abd/pelvis- Enhancing soft tissue lesion along the left bladder base, measuring approximately 3.2 x 2.7 cm, worrisome for primary bladder neoplasm. Correlate with tissue sampling on cystoscopy.  No findings suspicious for upper tract disease.  Cholelithiasis, without associated inflammatory changes.      11/19/2016 Procedure    TURBT by [name] of a 2 centimeter anterior bladder wall tumor, placement of epirubicin intravesical      11/19/2016 Procedure    50 milligrams of epirubicin and 50 mL of diluent.  This was left indwelling for 1 hour by [name]      11/22/2016 Pathology Results    Bladder, transurethral resection, bladder tumor INFILTRATING HIGH GRADE UROTHELIAL CARCINOMA THE CARCINOMA INVADES MUSCULARIS PROPRIA (DETRUSOR MUSCLE) Microscopic Comment The neoplasm stains positive for high molecular weight cytokeratin, p63 and negative for prostein. The immunostain pattern supports the diagnosis of urothelial carcinoma.      12/18/2016 Imaging    Bilateral interstitial prominence. Active pneumonitis cannot be excluded. These changes could be also be related chronic interstitial lung disease.      12/30/2016 - 04/28/2017 Chemotherapy    The patient had palonosetron (ALOXI) injection 0.25 mg, 0.25 mg, Intravenous,  Once, 4 of 4 cycles  CISplatin (PLATINOL) 138 mg in sodium chloride 0.9 % 500 mL chemo infusion, 70  mg/m2 = 138 mg, Intravenous,  Once, 4 of 4 cycles  gemcitabine (GEMZAR) 1,976 mg in sodium chloride 0.9 % 250 mL chemo infusion, 1,000 mg/m2 = 1,976 mg, Intravenous,  Once, 4 of 4 cycles Dose modification: 800 mg/m2 (80 % of original dose 1,000 mg/m2, Cycle 3, Reason: Provider Judgment, Comment: Thrombocytopenia)  fosaprepitant (EMEND) 150 mg, dexamethasone (DECADRON) 12 mg in sodium chloride 0.9 % 145 mL IVPB, , Intravenous,  Once, 4 of 4 cycles  for chemotherapy treatment.        02/17/2017 Treatment Plan Change    Treatment deferred x 7 days due to thrombocytopenia      02/17/2017 Treatment Plan Change    Gemcitabine dose reduced by 20% due to thrombocytopenia      03/10/2017 Treatment Plan Change    Cisplatin dose-reduced by 20%      04/28/2017 Treatment Plan Change    Day 15 of cycle #4 is cancelled due to thrombocytopenia (68,000).        HPI Elements Urothelial Ca  Location: Bladder  Quality:  Infiltrating, high-grade urothelial cancer  Severity: pT2N0- Clinical stage IIIA.  Duration: Dx on 11/22/2016  Context: TURBT on 11/19/2016 by [name]  Timing:   Modifying Factors: Tobacco abuse  Associated Signs & Symptoms:    He is tolerating treatment well.  He denies any nausea or vomiting.  His weight is stable.  He reports having urology follow-up on Friday, 05/02/2017 with repeat imaging.  He has ongoing issues with insomnia which is been a long-term, chronic issue.  I am not sure me medically we can correct this.  He has  failed temazepam and Ambien.  Review of Systems  Constitutional: Negative.  Negative for chills, fever and weight loss.  HENT: Negative.   Eyes: Negative.   Respiratory: Negative.  Negative for cough.   Cardiovascular: Negative.  Negative for chest pain.  Gastrointestinal: Negative.  Negative for blood in stool, constipation, diarrhea, melena, nausea and vomiting.  Genitourinary: Negative.   Musculoskeletal: Negative.   Skin: Negative.     Neurological: Negative.  Negative for weakness.  Endo/Heme/Allergies: Negative.   Psychiatric/Behavioral: The patient has insomnia.     Past Medical History:  Diagnosis Date  . Arthritis   . History of kidney stones   . Hypercholesteremia   . Urothelial carcinoma ([place]) 12/10/2016  . Urothelial carcinoma of bladder ([place]) 12/10/2016    Past Surgical History:  Procedure Laterality Date  . CARDIAC CATHETERIZATION    . open reduction and internal fixation leg Right    hip and leg.  Marland Kitchen PORTACATH PLACEMENT N/A 12/23/2016   Procedure: PLACEMENT OF TUNNELED CENTRAL VENOUS CATHETER RIGHT INTERNAL JUGULAR WITH SUBCUTANEOUS PORT;  Surgeon: Vickie Epley, MD;  Location: [facility];  Service: Vascular;  Laterality: N/A;  . reattatchment of left arm     from [place]  . TRANSURETHRAL RESECTION OF BLADDER TUMOR N/A 11/19/2016   Procedure: TRANSURETHRAL RESECTION OF BLADDER TUMOR (TURBT) WITH EPIRUBICIN INJECTION;  Surgeon: Franchot Gallo, MD;  Location: [facility];  Service: Urology;  Laterality: N/A;    Family History  Problem Relation Age of Onset  . Aneurysm Mother   . Diabetes Father   . Colon cancer Brother     Social History   Social History  . Marital status: Married    Spouse name: N/A  . Number of children: N/A  . Years of education: N/A   Social History Main Topics  . Smoking status: Current Every Day Smoker    Packs/day: 2.00    Years: 50.00    Types: Cigarettes  . Smokeless tobacco: Never Used  . Alcohol use No  . Drug use: No  . Sexual activity: Not on file     Comment: married-30 years-2 children by first wife   Other Topics Concern  . Not on file   Social History Narrative  . No narrative on file     PHYSICAL EXAMINATION  ECOG PERFORMANCE STATUS: 1 - Symptomatic but completely ambulatory  There were no vitals filed for this visit.  Vitals - 1 value per visit 1/61/0960  SYSTOLIC 454  DIASTOLIC 44  Pulse 67  Temperature 98.4  Respirations 18  Weight (lb)  163.4    GENERAL:alert, no distress, well nourished, well developed, comfortable, cooperative, smiling and chronically ill appearing, in chemo-recliner, accompanied by wife. SKIN: skin color, texture, turgor are normal, no rashes or significant lesions HEAD: Normocephalic, No masses, lesions, tenderness or abnormalities EYES: normal, EOMI, Conjunctiva are pink and non-injected EARS: External ears normal OROPHARYNX:lips, buccal mucosa, and tongue normal  NECK: supple, trachea midline LYMPH:  not examined BREAST:not examined LUNGS: not examined HEART: not examined ABDOMEN:not examined BACK: Back symmetric, no curvature. EXTREMITIES:less then 2 second capillary refill, no joint deformities, effusion, or inflammation, no skin discoloration, no cyanosis, positive findings:  Clubbing of fingernails  NEURO: alert & oriented x 3 with fluent speech, no focal motor/sensory deficits   LABORATORY DATA: CBC    Component Value Date/Time   WBC 4.6 04/28/2017 1138   RBC 2.82 (L) 04/28/2017 1138   HGB 9.4 (L) 04/28/2017 1138   HCT 27.7 (L) 04/28/2017 1138  PLT 68 (L) 04/28/2017 1138   MCV 98.2 04/28/2017 1138   MCH 33.3 04/28/2017 1138   MCHC 33.9 04/28/2017 1138   RDW 15.1 04/28/2017 1138   LYMPHSABS 2.3 04/28/2017 1138   MONOABS 0.5 04/28/2017 1138   EOSABS 0.0 04/28/2017 1138   BASOSABS 0.0 04/28/2017 1138      Chemistry      Component Value Date/Time   NA 137 04/28/2017 1138   K 4.4 04/28/2017 1138   CL 106 04/28/2017 1138   CO2 25 04/28/2017 1138   BUN 21 (H) 04/28/2017 1138   CREATININE 1.39 (H) 04/28/2017 1138      Component Value Date/Time   CALCIUM 9.0 04/28/2017 1138   ALKPHOS 76 04/28/2017 1138   AST 15 04/28/2017 1138   ALT 12 (L) 04/28/2017 1138   BILITOT 0.5 04/28/2017 1138        PENDING LABS:   RADIOGRAPHIC STUDIES:  No results found.   PATHOLOGY:    ASSESSMENT AND PLAN:  Urothelial carcinoma of bladder (HCC) Invasive urothelial carcinoma  (pT2N0) with TURBT by [name] on 01/21/2016 and epirubicin intra-bladder treatment BUT with concerns for clinically early T3 disease.  Undergoing systemic chemotherapy with cisplatin/gemcitabine beginning on 12/30/2016.  Lantus to undergo 4 cycles of therapy followed by consideration for cystoprostatectomy.  Treatment has been complicated by cytopenias requiring dose reduction chemotherapy and delay in treatments.  Oncology history is updated.  Labs today: CBC diff, CMET, magnesium.  I personally reviewed and went over laboratory results with the patient.  The results are noted within this dictation.  Labs DO NOT satisfy treatment parameters today.  Thrombocytopenia of 68,000 is noted today and therefore, day 15 cycle #4 is CANCELLED.  Will NOT make up this treatment date.  He is finished with chemotherapy.  Treatment plan is cancelled and discontinued.   He reports an appointment with [name], urology, on Friday, 05/02/2017.  He reports that he is due for CT imaging that day as well.  He prefers to having imaging completed at [facility], but the patient is advised that I will defer that to his urologist.  Will ask nursing to confirm patient's appointment with urologist.  He continues to have issues with insomnia.  I am not sure we can correct that medically.  He has tried Benzodiazepine (Temazepam) without benefit.  He has tried nonbenzodiazepine based hypnotic as well (Ambien) but reported side effect of sleep walking.    I will try Trazodone, 50 mg PO at HS.  If ineffective with dosing titration, then I will defer to his primary care provider.  Return in 6 weeks for follow-up.   Tobacco abuse Ongoing tobacco abuse.  Smoking cessation education and encouragement provided.  He continues to decrease his tobacco usage at the ongoing encouragement of his wife.  [name] On Lexapro with improvement.  ORDERS PLACED FOR THIS ENCOUNTER: No orders of the defined types were placed in this  encounter.   MEDICATIONS PRESCRIBED THIS ENCOUNTER: Meds ordered this encounter  Medications  . traZODone (DESYREL) 50 MG tablet    Sig: Take 1 tablet (50 mg total) by mouth at bedtime.    Dispense:  30 tablet    Refill:  0    Order Specific Question:   Supervising Provider    Answer:   Brunetta Genera [[id number]]    THERAPY PLAN:  Complete therapy today.  Chemotherapy will be cancelled due to thrombocytopenia.  He will follow-up with urology as directed.  All questions were answered. The  patient knows to call the clinic with any problems, questions or concerns. We can certainly see the patient much sooner if necessary.  Patient and plan discussed with Dr. Twana First and she is in agreement with the aforementioned.   This note is electronically signed by: Doy Mince 04/28/2017 12:44 PM

## 2017-04-28 NOTE — Progress Notes (Signed)
Labs abnormal, no treatment today per MD.

## 2017-04-28 NOTE — Patient Instructions (Signed)
[place] at [facility] Discharge Instructions  RECOMMENDATIONS MADE BY THE CONSULTANT AND ANY TEST RESULTS WILL BE SENT TO YOUR REFERRING PHYSICIAN.  You were seen today by Kirby Crigler [name]. No treatment today. Rx for Trazodone 50mg  to be taken at bedtime. Return in 6 weeks for follow up.   Thank you for choosing [place] at [facility] to provide your oncology and hematology care.  To afford each patient quality time with our provider, please arrive at least 15 minutes before your scheduled appointment time.    If you have a lab appointment with the [facility] please come in thru the  Main Entrance and check in at the main information desk  You need to re-schedule your appointment should you arrive 10 or more minutes late.  We strive to give you quality time with our providers, and arriving late affects you and other patients whose appointments are after yours.  Also, if you no show three or more times for appointments you may be dismissed from the clinic at the providers discretion.     Again, thank you for choosing [facility]  Our hope is that these requests will decrease the amount of time that you wait before being seen by our physicians.       _____________________________________________________________  Should you have questions after your visit to [facility], please contact our office at [phone] between the hours of 8:30 a.m. and 4:30 p.m.  Voicemails left after 4:30 p.m. will not be returned until the following business day.  For prescription refill requests, have your pharmacy contact our office.       Resources For Cancer Patients and their Caregivers ? American Cancer Society: Can assist with transportation, wigs, general needs, runs Look Good Feel Better.        (609) 077-5357 ? Cancer Care: Provides financial assistance, online support groups, medication/co-pay assistance.   1-800-813-HOPE 806-052-5997) ? [place] Assists [place] cancer patients and their families through emotional , educational and financial support.  (934)545-8655 ? [place] DSS Where to apply for food stamps, Medicaid and utility assistance. 772-687-3458 ? RCATS: Transportation to medical appointments. (236)185-2716 ? Social Security Administration: May apply for disability if have a Stage IV cancer. 413-344-8698 239 619 9350 ? [employer], Disability and Transit Services: Assists with nutrition, care and transit needs. [place] Support Programs: @10RELATIVEDAYS @ > Cancer Support Group  2nd Tuesday of the month 1pm-2pm, Journey Room  > Creative Journey  3rd Tuesday of the month 1130am-1pm, Journey Room  > Look Good Feel Better  1st Wednesday of the month 10am-12 noon, Journey Room (Call [place] to register [phone])

## 2017-04-28 NOTE — Assessment & Plan Note (Signed)
Ongoing tobacco abuse.  Smoking cessation education and encouragement provided.  He continues to decrease his tobacco usage at the ongoing encouragement of his wife.

## 2017-04-28 NOTE — Patient Instructions (Signed)
[place] at [facility] Discharge Instructions  RECOMMENDATIONS MADE BY THE CONSULTANT AND ANY TEST RESULTS WILL BE SENT TO YOUR REFERRING PHYSICIAN.  No treatment today. Follow up as scheduled.  Thank you for choosing [place] at [facility] to provide your oncology and hematology care.  To afford each patient quality time with our provider, please arrive at least 15 minutes before your scheduled appointment time.    If you have a lab appointment with the [facility] please come in thru the  Main Entrance and check in at the main information desk  You need to re-schedule your appointment should you arrive 10 or more minutes late.  We strive to give you quality time with our providers, and arriving late affects you and other patients whose appointments are after yours.  Also, if you no show three or more times for appointments you may be dismissed from the clinic at the providers discretion.     Again, thank you for choosing [facility].  Our hope is that these requests will decrease the amount of time that you wait before being seen by our physicians.       _____________________________________________________________  Should you have questions after your visit to [facility], please contact our office at [phone] between the hours of 8:30 a.m. and 4:30 p.m.  Voicemails left after 4:30 p.m. will not be returned until the following business day.  For prescription refill requests, have your pharmacy contact our office.       Resources For Cancer Patients and their Caregivers ? American Cancer Society: Can assist with transportation, wigs, general needs, runs Look Good Feel Better.        (272)019-2504 ? Cancer Care: Provides financial assistance, online support groups, medication/co-pay assistance.  1-800-813-HOPE 3208861896) ? [place] Assists [place] cancer patients and  their families through emotional , educational and financial support.  804-541-5821 ? [place] DSS Where to apply for food stamps, Medicaid and utility assistance. 939-298-7024 ? RCATS: Transportation to medical appointments. (956)750-8493 ? Social Security Administration: May apply for disability if have a Stage IV cancer. 364-616-1019 440-657-1922 ? [employer], Disability and Transit Services: Assists with nutrition, care and transit needs. [place] Support Programs: @10RELATIVEDAYS @ > Cancer Support Group  2nd Tuesday of the month 1pm-2pm, Journey Room  > Creative Journey  3rd Tuesday of the month 1130am-1pm, Journey Room  > Look Good Feel Better  1st Wednesday of the month 10am-12 noon, Journey Room (Call [place] to register [phone])

## 2017-05-12 ENCOUNTER — Other Ambulatory Visit (HOSPITAL_COMMUNITY): Payer: Self-pay | Admitting: Oncology

## 2017-05-12 DIAGNOSIS — R4589 Other symptoms and signs involving emotional state: Secondary | ICD-10-CM

## 2017-05-22 ENCOUNTER — Telehealth (HOSPITAL_COMMUNITY): Payer: Self-pay

## 2017-05-22 NOTE — Telephone Encounter (Signed)
Patient called stating he had a CT at the urology center yesterday and was told it did not show cancer. He wants to be sure Gershon Mussel, [name] can see the report and talk to him about it. Explained to patient I would discuss with Gershon Mussel and see if he has report. Patient states he will call back later.

## 2017-05-22 NOTE — Telephone Encounter (Signed)
Yes, I can see it.  That is wonderful news.  TK

## 2017-05-26 ENCOUNTER — Other Ambulatory Visit (HOSPITAL_COMMUNITY): Payer: Self-pay | Admitting: Oncology

## 2017-05-26 DIAGNOSIS — G4709 Other insomnia: Secondary | ICD-10-CM

## 2017-05-26 MED ORDER — TRAZODONE HCL 50 MG PO TABS
50.0000 mg | ORAL_TABLET | Freq: Every day | ORAL | 3 refills | Status: DC
Start: 2017-05-26 — End: 2017-08-05

## 2017-05-26 MED ORDER — TRAZODONE HCL 50 MG PO TABS: 50.0000 mg | ORAL_TABLET | Freq: Every day | ORAL | 3 refills | Status: DC

## 2017-06-09 ENCOUNTER — Encounter (HOSPITAL_COMMUNITY): Payer: Medicare HMO | Attending: Oncology | Admitting: Oncology

## 2017-06-09 ENCOUNTER — Encounter (HOSPITAL_COMMUNITY): Payer: Self-pay

## 2017-06-09 VITALS — BP 140/64 | HR 80 | Temp 98.4°F | Resp 16 | Wt 159.0 lb

## 2017-06-09 DIAGNOSIS — Z720 Tobacco use: Secondary | ICD-10-CM

## 2017-06-09 DIAGNOSIS — C679 Malignant neoplasm of bladder, unspecified: Secondary | ICD-10-CM | POA: Insufficient documentation

## 2017-06-09 NOTE — Progress Notes (Signed)
[facility] [place] [id number]  Urothelial carcinoma of bladder [facility]) - Plan: CBC with Differential, Comprehensive metabolic panel  CURRENT THERAPY: Cisplatin/gemcitabine days 1, 8, 15 every 28 days beginning on 12/30/2016.  INTERVAL HISTORY: Jeffery Ponce 71 y.o. male returns for followup of invasive urothelial carcinoma (pT2N0), but with clinical concerns for T3 disease.  AND Ongoing tobacco abuse.    Urothelial carcinoma of bladder ([place])   11/15/2016 Imaging    CT abd/pelvis- Enhancing soft tissue lesion along the left bladder base, measuring approximately 3.2 x 2.7 cm, worrisome for primary bladder neoplasm. Correlate with tissue sampling on cystoscopy.  No findings suspicious for upper tract disease.  Cholelithiasis, without associated inflammatory changes.      11/19/2016 Procedure    TURBT by [name] of a 2 centimeter anterior bladder wall tumor, placement of epirubicin intravesical      11/19/2016 Procedure    50 milligrams of epirubicin and 50 mL of diluent.  This was left indwelling for 1 hour by [name]      11/22/2016 Pathology Results    Bladder, transurethral resection, bladder tumor INFILTRATING HIGH GRADE UROTHELIAL CARCINOMA THE CARCINOMA INVADES MUSCULARIS PROPRIA (DETRUSOR MUSCLE) Microscopic Comment The neoplasm stains positive for high molecular weight cytokeratin, p63 and negative for prostein. The immunostain pattern supports the diagnosis of urothelial carcinoma.      12/18/2016 Imaging    Bilateral interstitial prominence. Active pneumonitis cannot be excluded. These changes could be also be related chronic interstitial lung disease.      12/30/2016 - 04/28/2017 Chemotherapy    The patient had palonosetron (ALOXI) injection 0.25 mg, 0.25 mg, Intravenous,  Once, 4 of 4 cycles  CISplatin (PLATINOL) 138 mg in sodium chloride 0.9 % 500 mL chemo infusion, 70 mg/m2 = 138 mg,  Intravenous,  Once, 4 of 4 cycles  gemcitabine (GEMZAR) 1,976 mg in sodium chloride 0.9 % 250 mL chemo infusion, 1,000 mg/m2 = 1,976 mg, Intravenous,  Once, 4 of 4 cycles Dose modification: 800 mg/m2 (80 % of original dose 1,000 mg/m2, Cycle 3, Reason: Provider Judgment, Comment: Thrombocytopenia)  fosaprepitant (EMEND) 150 mg, dexamethasone (DECADRON) 12 mg in sodium chloride 0.9 % 145 mL IVPB, , Intravenous,  Once, 4 of 4 cycles  for chemotherapy treatment.        02/17/2017 Treatment Plan Change    Treatment deferred x 7 days due to thrombocytopenia      02/17/2017 Treatment Plan Change    Gemcitabine dose reduced by 20% due to thrombocytopenia      03/10/2017 Treatment Plan Change    Cisplatin dose-reduced by 20%      04/28/2017 Treatment Plan Change    Day 15 of cycle #4 is cancelled due to thrombocytopenia (68,000).      05/02/2017 Imaging    CT abd/pelvis at [facility]-no evidence of metastatic disease or recurrent focal urothelial lesion on noncontrast imaging.  There is mild diffuse bladder wall thickening.  Urothelial evaluation limited without contrast.  Cholelithiasis, sigmoid diverticulosis, aortic atherosclerosis.       Patient presents today for continued follow-up. Since his last visit he's had a restaging CT scan performed by his urologist on 05/02/17 which demonstrated he has no evidence of cancer at this time. He states that he was recommended to get a radical cystectomy, however patient is hesitant due to "I don't want to wear a urine bag for the rest of my life". He states that  his energy level is still not up to his baseline he feels fatigued still however he states that he has been more active and has been doing more gardening. He states that appetite is good, however he's lost about 4 pounds since last visit. He denies any chest pain, shortness breath, abdominal pain, focal weakness. He denies any urinary symptoms including hematuria or dysuria.   Review of  Systems  Constitutional: Positive for malaise/fatigue and weight loss. Negative for chills and fever.  HENT: Negative.   Eyes: Negative.   Respiratory: Negative.  Negative for cough.   Cardiovascular: Negative.  Negative for chest pain.  Gastrointestinal: Negative.  Negative for blood in stool, constipation, diarrhea, melena, nausea and vomiting.  Genitourinary: Negative.   Musculoskeletal: Negative.   Skin: Negative.   Neurological: Negative.  Negative for weakness.  Endo/Heme/Allergies: Negative.   Psychiatric/Behavioral: The patient does not have insomnia.     Past Medical History:  Diagnosis Date  . Arthritis   . History of kidney stones   . Hypercholesteremia   . Urothelial carcinoma ([place]) 12/10/2016  . Urothelial carcinoma of bladder ([place]) 12/10/2016    Past Surgical History:  Procedure Laterality Date  . CARDIAC CATHETERIZATION    . open reduction and internal fixation leg Right    hip and leg.  Marland Kitchen PORTACATH PLACEMENT N/A 12/23/2016   Procedure: PLACEMENT OF TUNNELED CENTRAL VENOUS CATHETER RIGHT INTERNAL JUGULAR WITH SUBCUTANEOUS PORT;  Surgeon: Vickie Epley, MD;  Location: [facility];  Service: Vascular;  Laterality: N/A;  . reattatchment of left arm     from [place]  . TRANSURETHRAL RESECTION OF BLADDER TUMOR N/A 11/19/2016   Procedure: TRANSURETHRAL RESECTION OF BLADDER TUMOR (TURBT) WITH EPIRUBICIN INJECTION;  Surgeon: Franchot Gallo, MD;  Location: [facility];  Service: Urology;  Laterality: N/A;    Family History  Problem Relation Age of Onset  . Aneurysm Mother   . Diabetes Father   . Colon cancer Brother     Social History   Social History  . Marital status: Married    Spouse name: N/A  . Number of children: N/A  . Years of education: N/A   Social History Main Topics  . Smoking status: Current Every Day Smoker    Packs/day: 2.00    Years: 50.00    Types: Cigarettes  . Smokeless tobacco: Never Used  . Alcohol use No  . Drug use: No  . Sexual  activity: Not Asked     Comment: married-30 years-2 children by first wife   Other Topics Concern  . None   Social History Narrative  . None     PHYSICAL EXAMINATION  ECOG PERFORMANCE STATUS: 1 - Symptomatic but completely ambulatory  Vitals:   06/09/17 1034  BP: 140/64  Pulse: 80  Resp: 16  Temp: 98.4 F (36.9 C)    Vitals - 1 value per visit 1/61/0960  SYSTOLIC 454  DIASTOLIC 44  Pulse 67  Temperature 98.4  Respirations 18  Weight (lb) 163.4    Physical Exam  Constitutional: He is oriented to person, place, and time. He appears well-developed and well-nourished. No distress.  HENT:  Head: Normocephalic and atraumatic.  Mouth/Throat: No oropharyngeal exudate.  Eyes: EOM are normal. Pupils are equal, round, and reactive to light. No scleral icterus.  Neck: Normal range of motion. Neck supple. No JVD present.  Cardiovascular: Normal rate, regular rhythm, normal heart sounds and intact distal pulses.   Pulmonary/Chest: Effort normal and breath sounds normal. No respiratory distress. He  has no wheezes.  Abdominal: Soft. Bowel sounds are normal. He exhibits no distension. There is no tenderness.  Musculoskeletal: Normal range of motion. He exhibits no tenderness.  Neurological: He is alert and oriented to person, place, and time. No cranial nerve deficit.  Skin: Skin is warm and dry. No rash noted.    LABORATORY DATA: CBC    Component Value Date/Time   WBC 4.6 04/28/2017 1138   RBC 2.82 (L) 04/28/2017 1138   HGB 9.4 (L) 04/28/2017 1138   HCT 27.7 (L) 04/28/2017 1138   PLT 68 (L) 04/28/2017 1138   MCV 98.2 04/28/2017 1138   MCH 33.3 04/28/2017 1138   MCHC 33.9 04/28/2017 1138   RDW 15.1 04/28/2017 1138   LYMPHSABS 2.3 04/28/2017 1138   MONOABS 0.5 04/28/2017 1138   EOSABS 0.0 04/28/2017 1138   BASOSABS 0.0 04/28/2017 1138      Chemistry      Component Value Date/Time   NA 137 04/28/2017 1138   K 4.4 04/28/2017 1138   CL 106 04/28/2017 1138   CO2 25  04/28/2017 1138   BUN 21 (H) 04/28/2017 1138   CREATININE 1.39 (H) 04/28/2017 1138      Component Value Date/Time   CALCIUM 9.0 04/28/2017 1138   ALKPHOS 76 04/28/2017 1138   AST 15 04/28/2017 1138   ALT 12 (L) 04/28/2017 1138   BILITOT 0.5 04/28/2017 1138        PENDING LABS:   RADIOGRAPHIC STUDIES:  No results found.   PATHOLOGY:    ASSESSMENT AND PLAN:   Invasive urothelial carcinoma (pT2N0) with TURBT by [name] on 01/21/2016 and epirubicin intra-bladder treatment BUT with concerns for clinically early T3 disease.  Undergoing systemic chemotherapy with cisplatin/gemcitabine beginning on 12/30/2016. Patient planned to undergo 4 cycles of therapy followed by consideration for cystoprostatectomy.  Treatment has been complicated by cytopenias requiring dose reduction chemotherapy and delay in treatments. Last day of chemo was cycle 4 day 8 of cisplatin/gemzar on 04/21/17; day 15 was canceled due to thrombocytopenia. Follow up CT 04/2017 demonstrated a great response with no evidence of disease.  I have discussed with the patient that I agree with the recommendation for cystoprostatectomy, which was given by his urologist, since this will be his greatest chance for complete cure. Patient states that he will consider it, he has follow-up visit with urology on 06/25/17. If he decides that he does not want surgery, then I would recommend that he repeat his CT chest/abdomen/pelvis for ongoing active surveillance in 3 months. Return to clinic in 3 months for follow-up.  ORDERS PLACED FOR THIS ENCOUNTER: Orders Placed This Encounter  Procedures  . CBC with Differential  . Comprehensive metabolic panel     This note is electronically signed by: Twana First, MD 06/09/2017 11:49 AM

## 2017-06-25 ENCOUNTER — Other Ambulatory Visit: Payer: Self-pay | Admitting: Urology

## 2017-07-29 ENCOUNTER — Encounter (HOSPITAL_COMMUNITY): Payer: Self-pay

## 2017-07-29 ENCOUNTER — Inpatient Hospital Stay (HOSPITAL_COMMUNITY)
Admission: RE | Admit: 2017-07-29 | Discharge: 2017-08-05 | DRG: 654 | Disposition: A | Discharge status: Home Health | Payer: Medicare HMO | Attending: Urology | Admitting: Urology

## 2017-07-29 DIAGNOSIS — Z888 Allergy status to other drugs, medicaments and biological substances status: Secondary | ICD-10-CM | POA: Diagnosis not present

## 2017-07-29 DIAGNOSIS — Z87442 Personal history of urinary calculi: Secondary | ICD-10-CM | POA: Diagnosis not present

## 2017-07-29 DIAGNOSIS — Z880 Allergy status to penicillin: Secondary | ICD-10-CM | POA: Diagnosis not present

## 2017-07-29 DIAGNOSIS — R000 Tachycardia, unspecified: Secondary | ICD-10-CM | POA: Diagnosis present

## 2017-07-29 DIAGNOSIS — Z9889 Other specified postprocedural states: Secondary | ICD-10-CM

## 2017-07-29 DIAGNOSIS — Z800 Family history of malignant neoplasm of digestive organs: Secondary | ICD-10-CM

## 2017-07-29 DIAGNOSIS — E7800 Pure hypercholesterolemia, unspecified: Secondary | ICD-10-CM | POA: Diagnosis present

## 2017-07-29 DIAGNOSIS — M1990 Unspecified osteoarthritis, unspecified site: Secondary | ICD-10-CM | POA: Diagnosis present

## 2017-07-29 DIAGNOSIS — K567 Ileus, unspecified: Secondary | ICD-10-CM | POA: Diagnosis not present

## 2017-07-29 DIAGNOSIS — F17210 Nicotine dependence, cigarettes, uncomplicated: Secondary | ICD-10-CM | POA: Diagnosis present

## 2017-07-29 DIAGNOSIS — Z833 Family history of diabetes mellitus: Secondary | ICD-10-CM | POA: Diagnosis not present

## 2017-07-29 DIAGNOSIS — Z79899 Other long term (current) drug therapy: Secondary | ICD-10-CM | POA: Diagnosis not present

## 2017-07-29 DIAGNOSIS — C679 Malignant neoplasm of bladder, unspecified: Secondary | ICD-10-CM | POA: Diagnosis present

## 2017-07-29 DIAGNOSIS — Z881 Allergy status to other antibiotic agents status: Secondary | ICD-10-CM

## 2017-07-29 LAB — COMPREHENSIVE METABOLIC PANEL
ALBUMIN: 4.1 g/dL (ref 3.5–5.0)
ALK PHOS: 87 U/L (ref 38–126)
ALT: 20 U/L (ref 17–63)
AST: 18 U/L (ref 15–41)
Anion gap: 9 (ref 5–15)
BILIRUBIN TOTAL: 0.5 mg/dL (ref 0.3–1.2)
BUN: 17 mg/dL (ref 6–20)
CO2: 24 mmol/L (ref 22–32)
CREATININE: 1.42 mg/dL — AB (ref 0.61–1.24)
Calcium: 9 mg/dL (ref 8.9–10.3)
Chloride: 106 mmol/L (ref 101–111)
GFR calc Af Amer: 56 mL/min — ABNORMAL LOW (ref >60)
GFR, EST NON AFRICAN AMERICAN: 48 mL/min — AB (ref >60)
GLUCOSE: 91 mg/dL (ref 65–99)
Potassium: 4.1 mmol/L (ref 3.5–5.1)
SODIUM: 139 mmol/L (ref 135–145)
TOTAL PROTEIN: 7.2 g/dL (ref 6.5–8.1)

## 2017-07-29 LAB — CBC
HCT: 37.2 % — ABNORMAL LOW (ref 39.0–52.0)
HEMOGLOBIN: 12.3 g/dL — AB (ref 13.0–17.0)
MCH: 30.5 pg (ref 26.0–34.0)
MCHC: 33.1 g/dL (ref 30.0–36.0)
MCV: 92.3 fL (ref 78.0–100.0)
Platelets: 187 10*3/uL (ref 150–400)
RBC: 4.03 MIL/uL — AB (ref 4.22–5.81)
RDW: 13.2 % (ref 11.5–15.5)
WBC: 8.8 10*3/uL (ref 4.0–10.5)

## 2017-07-29 LAB — SURGICAL PCR SCREEN
MRSA, PCR: NEGATIVE
Staphylococcus aureus: NEGATIVE

## 2017-07-29 MED ORDER — METRONIDAZOLE 500 MG PO TABS
500.0000 mg | ORAL_TABLET | Freq: Three times a day (TID) | ORAL | Status: DC
  Administered 2017-07-29 (×2): 500 mg via ORAL
  Filled 2017-07-29 (×3): qty 1

## 2017-07-29 MED ORDER — DIAZEPAM 5 MG PO TABS
5.0000 mg | ORAL_TABLET | Freq: Three times a day (TID) | ORAL | Status: DC | PRN
  Administered 2017-07-29 – 2017-08-02 (×7): 5 mg via ORAL
  Filled 2017-07-29 (×8): qty 1

## 2017-07-29 MED ORDER — SODIUM CHLORIDE 0.9 % IV SOLN
INTRAVENOUS | Status: DC
  Administered 2017-07-29: 13:00:00 via INTRAVENOUS

## 2017-07-29 MED ORDER — CLINDAMYCIN PHOSPHATE 900 MG/50ML IV SOLN
900.0000 mg | Freq: Once | INTRAVENOUS | Status: AC
  Administered 2017-07-30: 900 mg via INTRAVENOUS
  Filled 2017-07-29: qty 50

## 2017-07-29 MED ORDER — NEOMYCIN SULFATE 500 MG PO TABS
1000.0000 mg | ORAL_TABLET | Freq: Three times a day (TID) | ORAL | Status: DC
  Administered 2017-07-29 (×2): 1000 mg via ORAL
  Filled 2017-07-29 (×3): qty 2

## 2017-07-29 MED ORDER — OXYCODONE HCL 5 MG PO TABS
5.0000 mg | ORAL_TABLET | ORAL | Status: DC | PRN
  Administered 2017-07-29 – 2017-07-30 (×3): 10 mg via ORAL
  Filled 2017-07-29 (×3): qty 2

## 2017-07-29 MED ORDER — SODIUM CHLORIDE 0.9% FLUSH
10.0000 mL | INTRAVENOUS | Status: DC | PRN
  Administered 2017-08-01 – 2017-08-05 (×2): 10 mL
  Filled 2017-07-29 (×2): qty 40

## 2017-07-29 MED ORDER — CIPROFLOXACIN IN D5W 400 MG/200ML IV SOLN
400.0000 mg | INTRAVENOUS | Status: AC
  Administered 2017-07-30: 400 mg via INTRAVENOUS
  Filled 2017-07-29: qty 200

## 2017-07-29 MED ORDER — ACETAMINOPHEN 500 MG PO TABS
1000.0000 mg | ORAL_TABLET | Freq: Three times a day (TID) | ORAL | Status: DC
  Administered 2017-07-29 (×2): 1000 mg via ORAL
  Filled 2017-07-29 (×3): qty 2

## 2017-07-29 MED ORDER — ALVIMOPAN 12 MG PO CAPS: 12.0000 mg | ORAL_CAPSULE | ORAL | Status: DC

## 2017-07-29 MED ORDER — PEG 3350-KCL-NA BICARB-NACL 420 G PO SOLR
4000.0000 mL | Freq: Once | ORAL | Status: AC
  Administered 2017-07-29: 4000 mL via ORAL

## 2017-07-29 MED ORDER — NICOTINE 21 MG/24HR TD PT24
21.0000 mg | MEDICATED_PATCH | Freq: Every day | TRANSDERMAL | Status: DC
  Administered 2017-07-29 – 2017-08-05 (×7): 21 mg via TRANSDERMAL
  Filled 2017-07-29 (×7): qty 1

## 2017-07-29 NOTE — Anesthesia Preprocedure Evaluation (Addendum)
Anesthesia Evaluation  Patient identified by MRN, date of birth, ID band Patient awake    Reviewed: Allergy & Precautions, H&P , NPO status , Patient's Chart, lab work & pertinent test results  Airway Mallampati: II  TM Distance: >3 FB Neck ROM: Full    Dental no notable dental hx. (+) Poor Dentition, Dental Advisory Given   Pulmonary Current Smoker,    Pulmonary exam normal breath sounds clear to auscultation       Cardiovascular Exercise Tolerance: Good negative cardio ROS   Rhythm:Regular Rate:Normal     Neuro/Psych negative neurological ROS  negative psych ROS   GI/Hepatic negative GI ROS, Neg liver ROS,   Endo/Other  negative endocrine ROS  Renal/GU negative Renal ROS  negative genitourinary   Musculoskeletal  (+) Arthritis , Osteoarthritis,    Abdominal   Peds  Hematology negative hematology ROS (+)   Anesthesia Other Findings   Reproductive/Obstetrics negative OB ROS                            Anesthesia Physical Anesthesia Plan  ASA: II  Anesthesia Plan: General   Post-op Pain Management:    Induction: Intravenous  PONV Risk Score and Plan: 2 and Ondansetron, Dexamethasone and Midazolam  Airway Management Planned: Oral ETT  Additional Equipment:   Intra-op Plan:   Post-operative Plan: Extubation in OR  Informed Consent: I have reviewed the patients History and Physical, chart, labs and discussed the procedure including the risks, benefits and alternatives for the proposed anesthesia with the patient or authorized representative who has indicated his/her understanding and acceptance.   Dental advisory given  Plan Discussed with: CRNA  Anesthesia Plan Comments:        Anesthesia Quick Evaluation

## 2017-07-29 NOTE — Care Management Note (Signed)
Case Management Note  Patient Details  Name: Jeffery Ponce MRN: [id number] Date of Birth: Apr 14, 1946  Subjective/Objective:70 y/o m admitted w/Bladder Ca. From home.WOC cons-await recc.                    Action/Plan:d/c plan home.   Expected Discharge Date:   (unknown)               Expected Discharge Plan:  Home/Self Care  In-House Referral:     Discharge planning Services  CM Consult  Post Acute Care Choice:    Choice offered to:     DME Arranged:    DME Agency:     HH Arranged:    HH Agency:     Status of Service:  In process, will continue to follow  If discussed at Long Length of Stay Meetings, dates discussed:    Additional Comments:  Dessa Phi, RN 07/29/2017, 1:31 PM

## 2017-07-29 NOTE — Progress Notes (Signed)
PHARMACIST - PHYSICIAN COMMUNICATION DR:   Tresa Moore CONCERNING:  Entereg (Alvimopam)  DESCRIPTION: Entereg pre-op dose ordered for cystectomy w/ ileal conduit procedure 8/22.  Entereg is contraindicated in patients who have received therapeutic opioids for >7 consecutive days immediately prior to use. Patient is on chronic oxycodone 10 mg q4h prn pain. Per [place] controlled substance database, patient has been receiving 30 day supplies (150 tablets per prescription) every month for at least the last several months.  Patient's last oxycodone dose PTA was reportedly taken today and has been resumed for inpatient use.  Order for Entereg has been discontinued per [employer] policy and procedure.  Thank you. Hershal Coria, PharmD, BCPS Pager: [phone] 07/29/2017 2:22 PM

## 2017-07-29 NOTE — Consult Note (Addendum)
[place] Nurse requested for preoperative stoma site marking  Discussed surgical procedure and stoma creation with patient and family.  Explained role of the [place] nurse team.  Provided the patient with educational booklet/DVD and demonsterated samples of pouching options.  Answered patient and family questions.   Examined patient lying, sitting, and standing in order to place the marking in the patient's visual field, away from any creases or abdominal contour issues and within the rectus muscle.  Attempted to mark below the patient's belt line, but was unable to related to a deep fold which occurs when he sits down which should be avoided if possible. Explained why the mark must be above where he wears his pants.   Marked for ileal conduit in the RLQ _2__  cm to the right of the umbilicus and  _8___ cm above the umbilicus.  Patient's abdomen cleansed with CHG wipes at site markings, allowed to air dry prior to marking.Covered mark with thin film transparent dressing to preserve mark until date of surgery.   [place] Nurse team will follow up with patient after surgery for continue ostomy care and teaching. Julien Girt MSN, RN, [place], [place], [place]

## 2017-07-29 NOTE — H&P (Signed)
Jeffery Ponce is an 71 y.o. male.    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 Staging CT w/o hydro / adenopathy / distant disease but some perivesical stranding and neovascularity aroudn left bladder neck somewhat concerning for early T3 disease. Cr 1.2. 30PY smoker, still smokes and actively cutting back.   04/2017 - Finished dose-reduced neo-adjuvant gem-cis under care of Twana First MD with [facility], CT w/o progression or distant disease   2 - Nephrolithiasis - one episode medical passage small stone years ago. Most recetn CT stone free.   PMH sig for ortho surgery. No ischemic CV disease / blood thinners. 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 His PCP is Neysa Hotter, [place].   Today "Jeffery Ponce" is seen as pre-op admission for labs, bowel prep, stomal markign prior to planned major extirpative cancer surgery tomorrow. No interval fevers.    Past Medical History:  Diagnosis Date  . Arthritis   . History of kidney stones   . Hypercholesteremia   . Urothelial carcinoma ([place]) 12/10/2016  . 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 CARDIAC CATHETERIZATION    . open reduction and internal fixation leg Right    hip and leg.  Marland Kitchen PORTACATH PLACEMENT N/A 12/23/2016   Procedure: PLACEMENT OF TUNNELED CENTRAL VENOUS CATHETER RIGHT INTERNAL JUGULAR WITH SUBCUTANEOUS PORT;  Surgeon: Vickie Epley, MD;  Location: [facility];  Service: Vascular;  Laterality: N/A;  . reattatchment of left arm     from [place]  . TRANSURETHRAL RESECTION OF BLADDER TUMOR N/A 11/19/2016   Procedure: TRANSURETHRAL RESECTION OF BLADDER TUMOR (TURBT) WITH EPIRUBICIN INJECTION;  Surgeon: Franchot Gallo, MD;  Location: [facility];  Service: Urology;  Laterality:  N/A;    Family History  Problem Relation Age of Onset  . Aneurysm Mother   . Diabetes Father   . Colon cancer Brother    Social History:  reports that he has been smoking Cigarettes.  He has a 100.00 pack-year smoking history. He has never used smokeless tobacco. He reports that he does not drink alcohol or use drugs.  Allergies:  Allergies  Allergen Reactions  . Penicillins Anaphylaxis    Has patient had a PCN reaction causing immediate rash, facial/tongue/throat swelling, SOB or lightheadedness with hypotension:Yes Has patient had a PCN reaction causing severe rash involving mucus membranes or skin necrosis:Yes Has patient had a PCN reaction that required hospitalization:No Has patient had a PCN reaction occurring within the last 10 years:No If all of the above answers are "NO", then may proceed with Cephalosporin use.   . Ciprofloxacin Itching    Itching at IV site. No hives or shortness of breathe.  . Ambien [Zolpidem Tartrate] Other (See Comments)    "Sleep walking"    Medications Prior to Admission  Medication Sig Dispense Refill  . diazepam (VALIUM) 10 MG tablet     . ondansetron (ZOFRAN) 8 MG tablet Take 1 tablet (8 mg total) by mouth every 8 (eight) hours as needed for nausea or vomiting. (Patient not taking: Reported on 04/15/2017) 30 tablet 2  . OxyCODONE ER (XTAMPZA ER) 13.5 MG C12A Xtampza ER 13.5 mg capsule sprinkle  1 BID Fill 04/30/2017    . Oxycodone HCl 10 MG TABS Take 10  mg by mouth every 4 (four) hours as needed for pain.    . Oxycodone HCl 10 MG TABS oxycodone 10 mg tablet  1 po q4h prn pain Max of 5 tab/dayFill on 04/30/2017    . prochlorperazine (COMPAZINE) 10 MG tablet Take 1 tablet (10 mg total) by mouth every 6 (six) hours as needed for nausea or vomiting. (Patient not taking: Reported on 04/15/2017) 30 tablet 2  . traZODone (DESYREL) 50 MG tablet Take 1 tablet (50 mg total) by mouth at bedtime. 30 tablet 3    No results found for this or any previous  visit (from the past 48 hour(s)). No results found.  Review of Systems  Constitutional: Negative.   HENT: Negative.   Eyes: Negative.   Respiratory: Negative.   Cardiovascular: Negative.   Gastrointestinal: Negative.   Genitourinary: Negative.   Musculoskeletal: Negative.   Skin: Negative.   Neurological: Negative.   Endo/Heme/Allergies: Negative.   Psychiatric/Behavioral: Negative.     There were no vitals taken for this visit. Physical Exam  Constitutional: He appears well-developed.  HENT:  Head: Normocephalic.  Eyes: Pupils are equal, round, and reactive to light.  Cardiovascular: Normal rate.   Respiratory: Effort normal.  GI: Soft.  Genitourinary:  Genitourinary Comments: NO CVAT.   Musculoskeletal: Normal range of motion.  Neurological: He is alert.  Skin: Skin is warm.  Psychiatric: He has a normal mood and affect.     Assessment/Plan  Proceed as planned with cystoprostatectomy with ileal conduit + ICG dye injection / lymphadenctomy tomorrow. Risksk benefits, alternatives, expected peri-op course with approximatly 7 day hospitalization discussed previously and reiterated today.  CMP, CBC, ND at 50, Bowel Prep, Entereg, Clears and NPO p MN.   Alexis Frock, MD 07/29/2017, 9:51 AM

## 2017-07-30 ENCOUNTER — Encounter (HOSPITAL_COMMUNITY): Payer: Self-pay | Admitting: Anesthesiology

## 2017-07-30 ENCOUNTER — Inpatient Hospital Stay (HOSPITAL_COMMUNITY): Payer: Medicare HMO | Admitting: Anesthesiology

## 2017-07-30 ENCOUNTER — Encounter (HOSPITAL_COMMUNITY)
Admission: RE | Disposition: A | Discharge status: Home Health | Payer: Self-pay | Source: Home / Self Care | Attending: Urology

## 2017-07-30 DIAGNOSIS — Z9889 Other specified postprocedural states: Secondary | ICD-10-CM

## 2017-07-30 HISTORY — PX: CYSTOSCOPY WITH INJECTION: SHX1424

## 2017-07-30 LAB — TYPE AND SCREEN
ABO/RH(D): A POS
Antibody Screen: NEGATIVE

## 2017-07-30 LAB — HEMOGLOBIN AND HEMATOCRIT, BLOOD
HCT: 35.5 % — ABNORMAL LOW (ref 39.0–52.0)
Hemoglobin: 11.6 g/dL — ABNORMAL LOW (ref 13.0–17.0)

## 2017-07-30 LAB — ABO/RH: ABO/RH(D): A POS

## 2017-07-30 SURGERY — ROBOT ASSISTED LAPAROSCOPIC RADICAL CYSTOPROSTATECTOMY BILATERAL PELVIC LYMPHADENECTOMY,ORTHOTOPIC NEOBLADDER
Anesthesia: General

## 2017-07-30 MED ORDER — BUPIVACAINE LIPOSOME 1.3 % IJ SUSP
20.0000 mL | INTRAMUSCULAR | Status: DC
  Filled 2017-07-30: qty 20

## 2017-07-30 MED ORDER — MIDAZOLAM HCL 2 MG/2ML IJ SOLN
INTRAMUSCULAR | Status: DC | PRN
  Administered 2017-07-30: 2 mg via INTRAVENOUS

## 2017-07-30 MED ORDER — DEXAMETHASONE SODIUM PHOSPHATE 10 MG/ML IJ SOLN
INTRAMUSCULAR | Status: DC | PRN
  Administered 2017-07-30: 10 mg via INTRAVENOUS

## 2017-07-30 MED ORDER — ONDANSETRON HCL 4 MG/2ML IJ SOLN
INTRAMUSCULAR | Status: AC
  Filled 2017-07-30: qty 2

## 2017-07-30 MED ORDER — HYDROMORPHONE HCL-NACL 0.5-0.9 MG/ML-% IV SOSY
PREFILLED_SYRINGE | INTRAVENOUS | Status: AC
  Filled 2017-07-30: qty 2

## 2017-07-30 MED ORDER — NALOXONE HCL 0.4 MG/ML IJ SOLN: 0.4000 mg | INTRAMUSCULAR | Status: DC | PRN

## 2017-07-30 MED ORDER — ROCURONIUM BROMIDE 50 MG/5ML IV SOSY
PREFILLED_SYRINGE | INTRAVENOUS | Status: AC
  Filled 2017-07-30: qty 5

## 2017-07-30 MED ORDER — PROPOFOL 1000 MG/100ML IV EMUL
5.0000 ug/kg/min | INTRAVENOUS | Status: DC
Start: 2017-07-30 — End: 2017-07-30

## 2017-07-30 MED ORDER — HYDROMORPHONE HCL 1 MG/ML IJ SOLN
INTRAMUSCULAR | Status: DC | PRN
  Administered 2017-07-30 (×3): .4 mg via INTRAVENOUS
  Administered 2017-07-30 (×2): .2 mg via INTRAVENOUS
  Administered 2017-07-30: .4 mg via INTRAVENOUS

## 2017-07-30 MED ORDER — SODIUM CHLORIDE 0.9% FLUSH: 9.0000 mL | INTRAVENOUS | Status: DC | PRN

## 2017-07-30 MED ORDER — SUGAMMADEX SODIUM 200 MG/2ML IV SOLN
INTRAVENOUS | Status: AC
  Filled 2017-07-30: qty 2

## 2017-07-30 MED ORDER — SENNOSIDES-DOCUSATE SODIUM 8.6-50 MG PO TABS
1.0000 | ORAL_TABLET | Freq: Two times a day (BID) | ORAL | Status: DC
  Administered 2017-07-31 – 2017-08-05 (×8): 1 via ORAL
  Filled 2017-07-30 (×12): qty 1

## 2017-07-30 MED ORDER — ONDANSETRON HCL 4 MG/2ML IJ SOLN
INTRAMUSCULAR | Status: DC | PRN
  Administered 2017-07-30: 4 mg via INTRAVENOUS

## 2017-07-30 MED ORDER — SUFENTANIL CITRATE 50 MCG/ML IV SOLN
INTRAVENOUS | Status: DC | PRN
  Administered 2017-07-30: 10 ug via INTRAVENOUS
  Administered 2017-07-30: 20 ug via INTRAVENOUS
  Administered 2017-07-30 (×7): 10 ug via INTRAVENOUS

## 2017-07-30 MED ORDER — LABETALOL HCL 5 MG/ML IV SOLN
INTRAVENOUS | Status: DC | PRN
  Administered 2017-07-30 (×3): 5 mg via INTRAVENOUS

## 2017-07-30 MED ORDER — ORAL CARE MOUTH RINSE
15.0000 mL | Freq: Two times a day (BID) | OROMUCOSAL | Status: DC
  Administered 2017-07-30 – 2017-08-05 (×12): 15 mL via OROMUCOSAL

## 2017-07-30 MED ORDER — LACTATED RINGERS IV SOLN
INTRAVENOUS | Status: DC | PRN
  Administered 2017-07-30: 08:00:00 via INTRAVENOUS

## 2017-07-30 MED ORDER — DIPHENHYDRAMINE HCL 12.5 MG/5ML PO ELIX: 12.5000 mg | ORAL_SOLUTION | Freq: Four times a day (QID) | ORAL | Status: DC | PRN

## 2017-07-30 MED ORDER — LIDOCAINE 2% (20 MG/ML) 5 ML SYRINGE
INTRAMUSCULAR | Status: DC | PRN
  Administered 2017-07-30: 100 mg via INTRAVENOUS

## 2017-07-30 MED ORDER — BUPIVACAINE LIPOSOME 1.3 % IJ SUSP
INTRAMUSCULAR | Status: DC | PRN
  Administered 2017-07-30: 40 mL

## 2017-07-30 MED ORDER — LABETALOL HCL 5 MG/ML IV SOLN
INTRAVENOUS | Status: AC
  Filled 2017-07-30: qty 4

## 2017-07-30 MED ORDER — LIDOCAINE 2% (20 MG/ML) 5 ML SYRINGE
INTRAMUSCULAR | Status: AC
  Filled 2017-07-30: qty 5

## 2017-07-30 MED ORDER — STERILE WATER FOR IRRIGATION IR SOLN
Status: DC | PRN
  Administered 2017-07-30: 3000 mL

## 2017-07-30 MED ORDER — SODIUM CHLORIDE 0.9 % IJ SOLN
INTRAMUSCULAR | Status: AC
  Filled 2017-07-30: qty 20

## 2017-07-30 MED ORDER — PHENYLEPHRINE 40 MCG/ML (10ML) SYRINGE FOR IV PUSH (FOR BLOOD PRESSURE SUPPORT)
PREFILLED_SYRINGE | INTRAVENOUS | Status: DC | PRN
  Administered 2017-07-30: 80 ug via INTRAVENOUS

## 2017-07-30 MED ORDER — SUGAMMADEX SODIUM 200 MG/2ML IV SOLN
INTRAVENOUS | Status: DC | PRN
  Administered 2017-07-30: 160 mg via INTRAVENOUS

## 2017-07-30 MED ORDER — HYDROMORPHONE HCL-NACL 0.5-0.9 MG/ML-% IV SOSY
PREFILLED_SYRINGE | INTRAVENOUS | Status: AC
  Filled 2017-07-30: qty 1

## 2017-07-30 MED ORDER — DIPHENHYDRAMINE HCL 50 MG/ML IJ SOLN: 12.5000 mg | Freq: Four times a day (QID) | INTRAMUSCULAR | Status: DC | PRN

## 2017-07-30 MED ORDER — ONDANSETRON HCL 4 MG/2ML IJ SOLN
4.0000 mg | INTRAMUSCULAR | Status: DC | PRN
  Administered 2017-08-03 (×2): 4 mg via INTRAVENOUS
  Filled 2017-07-30 (×2): qty 2

## 2017-07-30 MED ORDER — LACTATED RINGERS IV SOLN
INTRAVENOUS | Status: DC | PRN
  Administered 2017-07-30 (×3): via INTRAVENOUS

## 2017-07-30 MED ORDER — HYDROMORPHONE HCL-NACL 0.5-0.9 MG/ML-% IV SOSY: 0.5000 mg | PREFILLED_SYRINGE | INTRAVENOUS | Status: DC | PRN

## 2017-07-30 MED ORDER — PROPOFOL 10 MG/ML IV BOLUS
INTRAVENOUS | Status: AC
  Filled 2017-07-30: qty 20

## 2017-07-30 MED ORDER — SODIUM CHLORIDE 0.9 % IV SOLN: Freq: Once | INTRAVENOUS | Status: DC

## 2017-07-30 MED ORDER — ACETAMINOPHEN 10 MG/ML IV SOLN
1000.0000 mg | Freq: Four times a day (QID) | INTRAVENOUS | Status: DC
  Administered 2017-07-30: 1000 mg via INTRAVENOUS

## 2017-07-30 MED ORDER — ACETAMINOPHEN 10 MG/ML IV SOLN: 1000.0000 mg | Freq: Four times a day (QID) | INTRAVENOUS | Status: DC

## 2017-07-30 MED ORDER — PHENYLEPHRINE 40 MCG/ML (10ML) SYRINGE FOR IV PUSH (FOR BLOOD PRESSURE SUPPORT)
PREFILLED_SYRINGE | INTRAVENOUS | Status: AC
  Filled 2017-07-30: qty 10

## 2017-07-30 MED ORDER — MIDAZOLAM HCL 2 MG/2ML IJ SOLN
INTRAMUSCULAR | Status: AC
  Filled 2017-07-30: qty 2

## 2017-07-30 MED ORDER — HYDROMORPHONE 1 MG/ML IV SOLN
INTRAVENOUS | Status: DC
  Administered 2017-07-30: 17:00:00 via INTRAVENOUS
  Administered 2017-07-30: 5.1 mg via INTRAVENOUS
  Administered 2017-07-31: 3.9 mg via INTRAVENOUS
  Filled 2017-07-30: qty 25

## 2017-07-30 MED ORDER — PROPOFOL 10 MG/ML IV BOLUS
INTRAVENOUS | Status: DC | PRN
  Administered 2017-07-30: 130 mg via INTRAVENOUS

## 2017-07-30 MED ORDER — DEXTROSE-NACL 5-0.45 % IV SOLN
INTRAVENOUS | Status: DC
  Administered 2017-07-30 – 2017-08-03 (×6): via INTRAVENOUS

## 2017-07-30 MED ORDER — ALVIMOPAN 12 MG PO CAPS: 12.0000 mg | ORAL_CAPSULE | Freq: Two times a day (BID) | ORAL | Status: DC

## 2017-07-30 MED ORDER — HYDROMORPHONE HCL-NACL 0.5-0.9 MG/ML-% IV SOSY
0.2500 mg | PREFILLED_SYRINGE | INTRAVENOUS | Status: DC | PRN
  Administered 2017-07-30 (×5): 0.5 mg via INTRAVENOUS

## 2017-07-30 MED ORDER — HYDROMORPHONE HCL 2 MG/ML IJ SOLN
INTRAMUSCULAR | Status: AC
  Filled 2017-07-30: qty 1

## 2017-07-30 MED ORDER — SUFENTANIL CITRATE 50 MCG/ML IV SOLN
INTRAVENOUS | Status: AC
  Filled 2017-07-30: qty 2

## 2017-07-30 MED ORDER — SODIUM CHLORIDE 0.9 % IJ SOLN
INTRAMUSCULAR | Status: AC
  Filled 2017-07-30: qty 10

## 2017-07-30 MED ORDER — DEXAMETHASONE SODIUM PHOSPHATE 10 MG/ML IJ SOLN
INTRAMUSCULAR | Status: AC
  Filled 2017-07-30: qty 1

## 2017-07-30 MED ORDER — ROCURONIUM BROMIDE 10 MG/ML (PF) SYRINGE
PREFILLED_SYRINGE | INTRAVENOUS | Status: DC | PRN
  Administered 2017-07-30: 20 mg via INTRAVENOUS
  Administered 2017-07-30: 10 mg via INTRAVENOUS
  Administered 2017-07-30 (×2): 20 mg via INTRAVENOUS
  Administered 2017-07-30: 50 mg via INTRAVENOUS
  Administered 2017-07-30: 20 mg via INTRAVENOUS

## 2017-07-30 MED ORDER — LACTATED RINGERS IR SOLN
Status: DC | PRN
  Administered 2017-07-30: 1000 mL

## 2017-07-30 MED ORDER — SUCCINYLCHOLINE CHLORIDE 200 MG/10ML IV SOSY
PREFILLED_SYRINGE | INTRAVENOUS | Status: AC
  Filled 2017-07-30: qty 10

## 2017-07-30 SURGICAL SUPPLY — 98 items
ADH SKN CLS APL DERMABOND .7 (GAUZE/BANDAGES/DRESSINGS) ×1
AGENT HMST KT MTR STRL THRMB (HEMOSTASIS)
APL ESCP 34 STRL LF DISP (HEMOSTASIS)
APPLICATOR COTTON TIP 6IN STRL (MISCELLANEOUS) ×5 IMPLANT
APPLICATOR SURGIFLO ENDO (HEMOSTASIS) IMPLANT
BAG LAPAROSCOPIC 12 15 PORT 16 (BASKET) ×1 IMPLANT
BAG RETRIEVAL 12/15 (BASKET) ×2
BAG RETRIEVAL 12/15MM (BASKET) ×1
BAG URO CATCHER STRL LF (MISCELLANEOUS) ×1 IMPLANT
BLADE SURG SZ10 CARB STEEL (BLADE) IMPLANT
CATH FOLEY 2WAY SLVR 18FR 30CC (CATHETERS) ×3 IMPLANT
CELLS DAT CNTRL 66122 CELL SVR (MISCELLANEOUS) ×1 IMPLANT
CHLORAPREP W/TINT 26ML (MISCELLANEOUS) ×3 IMPLANT
CLIP LIGATING HEM O LOK PURPLE (MISCELLANEOUS) ×6 IMPLANT
CLIP LIGATING HEMO O LOK GREEN (MISCELLANEOUS) ×2 IMPLANT
CLIP VESOLOCK LG 6/CT PURPLE (CLIP) ×2 IMPLANT
CLIP VESOLOCK MED LG 6/CT (CLIP) ×3 IMPLANT
CLIP VESOLOCK XL 6/CT (CLIP) ×1 IMPLANT
CLOTH BEACON ORANGE TIMEOUT ST (SAFETY) ×3 IMPLANT
COVER SURGICAL LIGHT HANDLE (MISCELLANEOUS) ×3 IMPLANT
COVER TIP SHEARS 8 DVNC (MISCELLANEOUS) ×1 IMPLANT
COVER TIP SHEARS 8MM DA VINCI (MISCELLANEOUS) ×4
DECANTER SPIKE VIAL GLASS SM (MISCELLANEOUS) ×3 IMPLANT
DERMABOND ADVANCED (GAUZE/BANDAGES/DRESSINGS) ×2
DERMABOND ADVANCED .7 DNX12 (GAUZE/BANDAGES/DRESSINGS) ×2 IMPLANT
DRAIN PENROSE 18X1/2 LTX STRL (DRAIN) IMPLANT
DRAPE ARM DVNC X/XI (DISPOSABLE) ×4 IMPLANT
DRAPE COLUMN DVNC XI (DISPOSABLE) ×1 IMPLANT
DRAPE DA VINCI XI ARM (DISPOSABLE) ×8
DRAPE DA VINCI XI COLUMN (DISPOSABLE) ×2
DRSG TEGADERM 6X8 (GAUZE/BANDAGES/DRESSINGS) ×2 IMPLANT
ELECT CAUTERY BLADE 6.4 (BLADE) ×3 IMPLANT
ELECT REM PT RETURN 15FT ADLT (MISCELLANEOUS) ×3 IMPLANT
GLOVE BIO SURGEON STRL SZ 6.5 (GLOVE) ×4 IMPLANT
GLOVE BIO SURGEONS STRL SZ 6.5 (GLOVE) ×2
GLOVE BIOGEL M STRL SZ7.5 (GLOVE) ×9 IMPLANT
GOWN STRL REUS W/TWL LRG LVL3 (GOWN DISPOSABLE) ×15 IMPLANT
GOWN STRL REUS W/TWL XL LVL3 (GOWN DISPOSABLE) ×3 IMPLANT
IRRIG SUCT STRYKERFLOW 2 WTIP (MISCELLANEOUS) ×3
IRRIGATION SUCT STRKRFLW 2 WTP (MISCELLANEOUS) ×1 IMPLANT
KIT PROCEDURE DA VINCI SI (MISCELLANEOUS) ×2
KIT PROCEDURE DVNC SI (MISCELLANEOUS) IMPLANT
LOOP VESSEL MAXI BLUE (MISCELLANEOUS) ×3 IMPLANT
LOOP VESSEL MINI RED (MISCELLANEOUS) IMPLANT
MANIFOLD NEPTUNE II (INSTRUMENTS) ×3 IMPLANT
NDL INSUFFLATION 14GA 120MM (NEEDLE) ×1 IMPLANT
NEEDLE INSUFFLATION 14GA 120MM (NEEDLE) ×3 IMPLANT
PACK CYSTO (CUSTOM PROCEDURE TRAY) ×1 IMPLANT
PACK ROBOT UROLOGY CUSTOM (CUSTOM PROCEDURE TRAY) ×3 IMPLANT
PAD POSITIONING PINK XL (MISCELLANEOUS) ×3 IMPLANT
PORT ACCESS TROCAR AIRSEAL 12 (TROCAR) ×1 IMPLANT
PORT ACCESS TROCAR AIRSEAL 5M (TROCAR) ×2
RELOAD STAPLE 60 2.6 WHT THN (STAPLE) ×7 IMPLANT
RELOAD STAPLE 60 4.1 GRN THCK (STAPLE) ×5 IMPLANT
RELOAD STAPLER GREEN 60MM (STAPLE) ×5 IMPLANT
RELOAD STAPLER WHITE 60MM (STAPLE) ×7 IMPLANT
RETRACTOR LONRSTAR 16.6X16.6CM (MISCELLANEOUS) IMPLANT
RETRACTOR STAY HOOK 5MM (MISCELLANEOUS) IMPLANT
RETRACTOR STER APS 16.6X16.6CM (MISCELLANEOUS)
RETRACTOR WND ALEXIS 18 MED (MISCELLANEOUS) ×1 IMPLANT
RTRCTR WOUND ALEXIS 18CM MED (MISCELLANEOUS) ×3
SEAL CANN UNIV 5-8 DVNC XI (MISCELLANEOUS) ×4 IMPLANT
SEAL XI 5MM-8MM UNIVERSAL (MISCELLANEOUS) ×8
SET TRI-LUMEN FLTR TB AIRSEAL (TUBING) ×2 IMPLANT
SOLUTION ELECTROLUBE (MISCELLANEOUS) ×3 IMPLANT
SPONGE LAP 18X18 X RAY DECT (DISPOSABLE) ×6 IMPLANT
SPONGE LAP 4X18 X RAY DECT (DISPOSABLE) ×3 IMPLANT
STAPLER ECHELON LONG 60 440 (INSTRUMENTS) ×3 IMPLANT
STAPLER RELOAD GREEN 60MM (STAPLE) ×15
STAPLER RELOAD WHITE 60MM (STAPLE) ×21
STENT SET URETHERAL LEFT 7FR (STENTS) ×3 IMPLANT
STENT SET URETHERAL RIGHT 7FR (STENTS) ×3 IMPLANT
SURGIFLO W/THROMBIN 8M KIT (HEMOSTASIS) IMPLANT
SUT CHROMIC 4 0 RB 1X27 (SUTURE) ×3 IMPLANT
SUT ETHILON 3 0 PS 1 (SUTURE) ×3 IMPLANT
SUT MNCRL AB 4-0 PS2 18 (SUTURE) ×6 IMPLANT
SUT PDS AB 0 CTX 36 PDP370T (SUTURE) ×9 IMPLANT
SUT SILK 3 0 SH 30 (SUTURE) IMPLANT
SUT SILK 3 0 SH CR/8 (SUTURE) ×3 IMPLANT
SUT VIC AB 2-0 SH 18 (SUTURE) IMPLANT
SUT VIC AB 2-0 UR5 27 (SUTURE) ×12 IMPLANT
SUT VIC AB 3-0 SH 27 (SUTURE) ×12
SUT VIC AB 3-0 SH 27X BRD (SUTURE) ×1 IMPLANT
SUT VIC AB 3-0 SH 27XBRD (SUTURE) ×1 IMPLANT
SUT VIC AB 4-0 RB1 27 (SUTURE) ×12
SUT VIC AB 4-0 RB1 27XBRD (SUTURE) ×4 IMPLANT
SUT VLOC BARB 180 ABS3/0GR12 (SUTURE) ×3
SUTURE VLOC BRB 180 ABS3/0GR12 (SUTURE) ×1 IMPLANT
SYR CONTROL 10ML LL (SYRINGE) IMPLANT
SYSTEM UROSTOMY GENTLE TOUCH (WOUND CARE) ×3 IMPLANT
TOWEL OR NON WOVEN STRL DISP B (DISPOSABLE) ×3 IMPLANT
TROCAR BLADELESS 15MM (ENDOMECHANICALS) ×3 IMPLANT
TUBING CONNECTING 10 (TUBING) ×1 IMPLANT
TUBING CONNECTING 10' (TUBING) ×1
VESSEL LOOPS MINI RED (MISCELLANEOUS)
WATER STERILE IRR 1000ML POUR (IV SOLUTION) ×2 IMPLANT
WATER STERILE IRR 3000ML UROMA (IV SOLUTION) ×3 IMPLANT
YANKAUER SUCT BULB TIP 10FT TU (MISCELLANEOUS) ×2 IMPLANT

## 2017-07-30 NOTE — Brief Op Note (Signed)
07/29/2017 - 07/30/2017  2:12 PM  PATIENT:  Jeffery Ponce  71 y.o. male  PRE-OPERATIVE DIAGNOSIS:  BLADDER CANCER  POST-OPERATIVE DIAGNOSIS:  BLADDER CANCER  PROCEDURE:  Procedure(s) with comments: ROBOT ASSISTED LAPAROSCOPIC RADICAL CYSTOPROSTATECTOMY BILATERAL PELVIC LYMPHADENECTOMY, ILEAL CONDUIT (N/A) - ADMIT THE DAY BEFORE NEEDS ICU BED POST OP CYSTOSCOPY WITH INJECTION OF INDOCYANINE GREEN DYE (N/A)  SURGEON:  Surgeon(s) and Role:    * Alexis Frock, MD - Primary  PHYSICIAN ASSISTANT:   ASSISTANTS: Debbrah Alar PA   ANESTHESIA:   local and general  EBL:  Total I/O In: 2000 [I.V.:2000] Out: 400 [Urine:50; Blood:350]  BLOOD ADMINISTERED:none  DRAINS: 1 -JP to bulb, 2 - Urostomy to gravity with Rt (red) and Lt (blue) bander stents   LOCAL MEDICATIONS USED:  MARCAINE     SPECIMEN:  Source of Specimen:  1 - ureteral margins, 2 - pelvic lymph nodes, 3 - cystoprostatectomy  DISPOSITION OF SPECIMEN:  PATHOLOGY  COUNTS:  YES  TOURNIQUET:  * No tourniquets in log *  DICTATION: .Other Dictation: Dictation Number [id number]  PLAN OF CARE: Admit to inpatient   PATIENT DISPOSITION:  PACU - hemodynamically stable.   Delay start of Pharmacological VTE agent (>24hrs) due to surgical blood loss or risk of bleeding: yes

## 2017-07-30 NOTE — Progress Notes (Signed)
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 Staging CT w/o hydro / adenopathy / distant disease but some perivesical stranding and neovascularity aroudn left bladder neck somewhat concerning for early T3 disease. Cr 1.2. 30PY smoker, still smokes and actively cutting back.   04/2017 - Finished dose-reduced neo-adjuvant gem-cis under care of Twana First MD with [facility], CT w/o progression or distant disease   Today "Jeffery Ponce" is ready for cystoprostatectomy. COmpleted mechanical + ABX bowel prep. Hgb 12's. Stomal marking completed.    Objective: Vital signs in last 24 hours: Temp:  [97.6 F (36.4 C)-98.2 F (36.8 C)] 98.1 F (36.7 C) (08/22 0519) Pulse Rate:  [59-85] 59 (08/22 0519) Resp:  [18-19] 18 (08/22 0519) BP: (115-141)/(56-73) 115/56 (08/22 0519) SpO2:  [95 %-97 %] 95 % (08/22 0519) Last BM Date: 07/29/17  Intake/Output from previous day: 08/21 0701 - 08/22 0700 In: 3069.2 [P.O.:2240; I.V.:829.2] Out: -  Intake/Output this shift: Total I/O In: 990 [P.O.:240; I.V.:750] Out: -   General appearance: alert, cooperative and famil at bedside Eyes: negative Nose: Nares normal. Septum midline. Mucosa normal. No drainage or sinus tenderness. Throat: lips, mucosa, and tongue normal; teeth and gums normal Neck: supple, symmetrical, trachea midline Back: symmetric, no curvature. ROM normal. No CVA tenderness. Resp: non-labored on room air.  Cardio: Nl rate Male genitalia: normal Pelvic: external genitalia normal No C/c/e  Lab Results:   Recent Labs  07/29/17 1414  WBC 8.8  HGB 12.3*  HCT 37.2*  PLT 187   BMET  Recent Labs  07/29/17 1414  NA 139  K 4.1  CL 106  CO2 24  GLUCOSE 91  BUN 17  CREATININE 1.42*  CALCIUM 9.0   PT/INR No results for input(s): LABPROT, INR in the last 72 hours. ABG No results for input(s): PHART, HCO3 in the last 72 hours.  Invalid  input(s): PCO2, PO2  Studies/Results: No results found.  Anti-infectives: Anti-infectives    Start     Dose/Rate Route Frequency Ordered Stop   07/30/17 0700  ciprofloxacin (CIPRO) IVPB 400 mg     400 mg 200 mL/hr over 60 Minutes Intravenous 60 min pre-op 07/29/17 0949     07/30/17 0700  clindamycin (CLEOCIN) IVPB 900 mg     900 mg 100 mL/hr over 30 Minutes Intravenous  Once 07/29/17 0949     07/29/17 1400  neomycin (MYCIFRADIN) tablet 1,000 mg     1,000 mg Oral Every 8 hours 07/29/17 1252 07/30/17 1359   07/29/17 1400  metroNIDAZOLE (FLAGYL) tablet 500 mg     500 mg Oral Every 8 hours 07/29/17 1252 07/30/17 1359      Assessment/Plan:  Proceed as planned today with  Cystoscopy / ICG and cystoprostatectomy. He ha good understanding of risks, benefits, alternatives, and expected peri-op course.   [facility], Larayah Clute 07/30/2017

## 2017-07-30 NOTE — Progress Notes (Signed)
Pt educated about incentive spirometer use. At this time, drowsy and in significant pain, unable to use. Will educate again once pain better controlled and pt more alert.

## 2017-07-30 NOTE — Transfer of Care (Signed)
Immediate Anesthesia Transfer of Care Note  Patient: Jeffery Ponce  Procedure(s) Performed: Procedure(s) with comments: ROBOT ASSISTED LAPAROSCOPIC RADICAL CYSTOPROSTATECTOMY BILATERAL PELVIC LYMPHADENECTOMY, ILEAL CONDUIT (N/A) - ADMIT THE DAY BEFORE NEEDS ICU BED POST OP CYSTOSCOPY WITH INJECTION OF INDOCYANINE GREEN DYE (N/A)  Patient Location: PACU  Anesthesia Type:General  Level of Consciousness: awake  Airway & Oxygen Therapy: Patient Spontanous Breathing and Patient connected to face mask oxygen  Post-op Assessment: Report given to RN and Post -op Vital signs reviewed and stable  Post vital signs: Reviewed and stable  Last Vitals:  Vitals:   07/29/17 1944 07/30/17 0519  BP: (!) 141/66 (!) 115/56  Pulse: 75 (!) 59  Resp: 19 18  Temp: 36.4 C 36.7 C  SpO2: 96% 95%    Last Pain:  Vitals:   07/30/17 0519  TempSrc: Oral  PainSc:       Patients Stated Pain Goal: 4 (04/88/89 1694)  Complications: No apparent anesthesia complications

## 2017-07-30 NOTE — Progress Notes (Signed)
To OR

## 2017-07-30 NOTE — Interval H&P Note (Signed)
History and Physical Interval Note:  07/30/2017 7:55 AM  Jeffery Ponce  has presented today for surgery, with the diagnosis of BLADDER CANCER  The various methods of treatment have been discussed with the patient and family. After consideration of risks, benefits and other options for treatment, the patient has consented to  Procedure(s) with comments: ROBOT ASSISTED LAPAROSCOPIC RADICAL CYSTOPROSTATECTOMY BILATERAL PELVIC LYMPHADENECTOMY, ILEAL CONDUIT (N/A) - ADMIT THE DAY BEFORE NEEDS ICU BED POST OP CYSTOSCOPY WITH INJECTION OF INDOCYANINE GREEN DYE (N/A) as a surgical intervention .  The patient's history has been reviewed, patient examined, no change in status, stable for surgery.  I have reviewed the patient's chart and labs.  Questions were answered to the patient's satisfaction.     Chondra Boyde

## 2017-07-30 NOTE — Progress Notes (Signed)
[place] Physician-Brief Progress Note Patient Name: Jeffery Ponce DOB: Apr 24, 1946 MRN: [id number]   Date of Service  07/30/2017  HPI/Events of Note  Post op bladder surgery Resting comfortably  eICU Interventions  No new orders     Intervention Category Evaluation Type: New Patient Evaluation  Flora Lipps 07/30/2017, 5:09 PM

## 2017-07-30 NOTE — H&P (View-Only) (Signed)
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 Staging CT w/o hydro / adenopathy / distant disease but some perivesical stranding and neovascularity aroudn left bladder neck somewhat concerning for early T3 disease. Cr 1.2. 30PY smoker, still smokes and actively cutting back.   04/2017 - Finished dose-reduced neo-adjuvant gem-cis under care of Louise Zhou MD with [facility], CT w/o progression or distant disease   Today "Jeffery Ponce" is ready for cystoprostatectomy. COmpleted mechanical + ABX bowel prep. Hgb 12's. Stomal marking completed.    Objective: Vital signs in last 24 hours: Temp:  [97.6 F (36.4 C)-98.2 F (36.8 C)] 98.1 F (36.7 C) (08/22 0519) Pulse Rate:  [59-85] 59 (08/22 0519) Resp:  [18-19] 18 (08/22 0519) BP: (115-141)/(56-73) 115/56 (08/22 0519) SpO2:  [95 %-97 %] 95 % (08/22 0519) Last BM Date: 07/29/17  Intake/Output from previous day: 08/21 0701 - 08/22 0700 In: 3069.2 [P.O.:2240; I.V.:829.2] Out: -  Intake/Output this shift: Total I/O In: 990 [P.O.:240; I.V.:750] Out: -   General appearance: alert, cooperative and famil at bedside Eyes: negative Nose: Nares normal. Septum midline. Mucosa normal. No drainage or sinus tenderness. Throat: lips, mucosa, and tongue normal; teeth and gums normal Neck: supple, symmetrical, trachea midline Back: symmetric, no curvature. ROM normal. No CVA tenderness. Resp: non-labored on room air.  Cardio: Nl rate Male genitalia: normal Pelvic: external genitalia normal No C/c/e  Lab Results:   Recent Labs  07/29/17 1414  WBC 8.8  HGB 12.3*  HCT 37.2*  PLT 187   BMET  Recent Labs  07/29/17 1414  NA 139  K 4.1  CL 106  CO2 24  GLUCOSE 91  BUN 17  CREATININE 1.42*  CALCIUM 9.0   PT/INR No results for input(s): LABPROT, INR in the last 72 hours. ABG No results for input(s): PHART, HCO3 in the last 72 hours.  Invalid  input(s): PCO2, PO2  Studies/Results: No results found.  Anti-infectives: Anti-infectives    Start     Dose/Rate Route Frequency Ordered Stop   07/30/17 0700  ciprofloxacin (CIPRO) IVPB 400 mg     400 mg 200 mL/hr over 60 Minutes Intravenous 60 min pre-op 07/29/17 0949     07/30/17 0700  clindamycin (CLEOCIN) IVPB 900 mg     900 mg 100 mL/hr over 30 Minutes Intravenous  Once 07/29/17 0949     07/29/17 1400  neomycin (MYCIFRADIN) tablet 1,000 mg     1,000 mg Oral Every 8 hours 07/29/17 1252 07/30/17 1359   07/29/17 1400  metroNIDAZOLE (FLAGYL) tablet 500 mg     500 mg Oral Every 8 hours 07/29/17 1252 07/30/17 1359      Assessment/Plan:  Proceed as planned today with  Cystoscopy / ICG and cystoprostatectomy. He ha good understanding of risks, benefits, alternatives, and expected peri-op course.   Jeffery Ponce 07/30/2017 

## 2017-07-30 NOTE — Discharge Instructions (Signed)

## 2017-07-30 NOTE — Anesthesia Procedure Notes (Signed)
Procedure Name: Intubation Date/Time: 07/30/2017 8:38 AM Performed by: Danley Danker L Patient Re-evaluated:Patient Re-evaluated prior to induction Oxygen Delivery Method: Circle system utilized Preoxygenation: Pre-oxygenation with 100% oxygen Induction Type: IV induction Ventilation: Mask ventilation without difficulty and Oral airway inserted - appropriate to patient size Laryngoscope Size: Glidescope and 3 Grade View: Grade I Tube type: Parker flex tip Tube size: 7.5 mm Number of attempts: 1 Airway Equipment and Method: Video-laryngoscopy Placement Confirmation: ETT inserted through vocal cords under direct vision,  positive ETCO2 and breath sounds checked- equal and bilateral Secured at: 22 cm Tube secured with: Tape Dental Injury: Teeth and Oropharynx as per pre-operative assessment  Comments: Glidescope used for anterior airway

## 2017-07-30 NOTE — Anesthesia Postprocedure Evaluation (Signed)
Anesthesia Post Note  Patient: Jeffery Ponce  Procedure(s) Performed: Procedure(s) (LRB): ROBOT ASSISTED LAPAROSCOPIC RADICAL CYSTOPROSTATECTOMY BILATERAL PELVIC LYMPHADENECTOMY, ILEAL CONDUIT (N/A) CYSTOSCOPY WITH INJECTION OF INDOCYANINE GREEN DYE (N/A)     Patient location during evaluation: PACU Anesthesia Type: General Level of consciousness: awake and alert Pain management: pain level controlled Vital Signs Assessment: post-procedure vital signs reviewed and stable Respiratory status: spontaneous breathing, nonlabored ventilation, respiratory function stable and patient connected to nasal cannula oxygen Cardiovascular status: blood pressure returned to baseline and stable Postop Assessment: no signs of nausea or vomiting Anesthetic complications: no    Last Vitals:  Vitals:   07/30/17 1545 07/30/17 1559  BP: 136/61 (!) 143/65  Pulse: 77 76  Resp: (!) 23 13  Temp:  36.9 C  SpO2: 97% 97%    Last Pain:  Vitals:   07/30/17 1559  TempSrc:   PainSc: Asleep                 Uziah Sorter,W. EDMOND

## 2017-07-31 ENCOUNTER — Encounter (HOSPITAL_COMMUNITY): Payer: Self-pay

## 2017-07-31 LAB — BASIC METABOLIC PANEL
ANION GAP: 5 (ref 5–15)
BUN: 21 mg/dL — ABNORMAL HIGH (ref 6–20)
CALCIUM: 8.1 mg/dL — AB (ref 8.9–10.3)
CO2: 25 mmol/L (ref 22–32)
Chloride: 108 mmol/L (ref 101–111)
Creatinine, Ser: 1.34 mg/dL — ABNORMAL HIGH (ref 0.61–1.24)
GFR calc non Af Amer: 52 mL/min — ABNORMAL LOW (ref >60)
GFR, EST AFRICAN AMERICAN: 60 mL/min — AB (ref >60)
GLUCOSE: 109 mg/dL — AB (ref 65–99)
POTASSIUM: 5 mmol/L (ref 3.5–5.1)
SODIUM: 138 mmol/L (ref 135–145)

## 2017-07-31 LAB — HEMOGLOBIN AND HEMATOCRIT, BLOOD
HCT: 32.4 % — ABNORMAL LOW (ref 39.0–52.0)
Hemoglobin: 10.6 g/dL — ABNORMAL LOW (ref 13.0–17.0)

## 2017-07-31 MED ORDER — HYDROMORPHONE 1 MG/ML IV SOLN
INTRAVENOUS | Status: DC
  Administered 2017-07-31: 5.7 mg via INTRAVENOUS
  Administered 2017-07-31: 7.2 mg via INTRAVENOUS
  Administered 2017-07-31: 8.4 mg via INTRAVENOUS
  Administered 2017-07-31: 11.4 mg via INTRAVENOUS
  Administered 2017-08-01: 3.6 mg via INTRAVENOUS
  Administered 2017-08-01: 5.1 mg via INTRAVENOUS
  Administered 2017-08-01: 0.9 mg via INTRAVENOUS
  Administered 2017-08-01: 5.1 mg via INTRAVENOUS
  Administered 2017-08-01: 4.5 mg via INTRAVENOUS
  Administered 2017-08-01: 6.6 mg via INTRAVENOUS
  Administered 2017-08-01: 03:00:00 via INTRAVENOUS
  Administered 2017-08-02: 0.8 mg via INTRAVENOUS
  Administered 2017-08-02: 3.6 mg via INTRAVENOUS
  Administered 2017-08-02: 3 mg via INTRAVENOUS
  Administered 2017-08-02: 2.1 mg via INTRAVENOUS
  Administered 2017-08-02: 3.3 mg via INTRAVENOUS
  Administered 2017-08-03: 4.8 mg via INTRAVENOUS
  Administered 2017-08-03: 1.5 mg via INTRAVENOUS
  Administered 2017-08-03: 1.2 mg via INTRAVENOUS
  Administered 2017-08-03: 4.8 mg via INTRAVENOUS
  Administered 2017-08-03: 19:00:00 via INTRAVENOUS
  Administered 2017-08-04: 1.8 mg via INTRAVENOUS
  Administered 2017-08-04: 0.8 mg via INTRAVENOUS
  Filled 2017-07-31 (×4): qty 25

## 2017-07-31 MED ORDER — ACETAMINOPHEN 500 MG PO TABS
1000.0000 mg | ORAL_TABLET | Freq: Four times a day (QID) | ORAL | Status: DC
  Administered 2017-07-31 – 2017-08-05 (×17): 1000 mg via ORAL
  Filled 2017-07-31 (×20): qty 2

## 2017-07-31 MED ORDER — CHLORHEXIDINE GLUCONATE CLOTH 2 % EX PADS
6.0000 | MEDICATED_PAD | Freq: Every day | CUTANEOUS | Status: DC
  Administered 2017-07-31: 6 via TOPICAL

## 2017-07-31 MED ORDER — TRAZODONE HCL 100 MG PO TABS
100.0000 mg | ORAL_TABLET | Freq: Every evening | ORAL | Status: DC | PRN
  Administered 2017-07-31 – 2017-08-05 (×4): 100 mg via ORAL
  Filled 2017-07-31 (×4): qty 1

## 2017-07-31 NOTE — Progress Notes (Signed)
Patient has not been able to keep his pain under control with the PCA pump. Pain has worsened after patient tried to ambulate. Notified on call Urology MD. Orders to increase Dilaudid PCA 1 hr limit dose from 1.25mg   to 3 mg given.

## 2017-07-31 NOTE — Op Note (Signed)
NAME:  Jeffery Ponce, Jeffery Ponce                      ACCOUNT NO.:  MEDICAL RECORD NO.:  [id number]  LOCATION:                                 FACILITY:  PHYSICIAN:  Alexis Frock, MD          DATE OF BIRTH:  DATE OF PROCEDURE: 07/30/2017                              OPERATIVE REPORT   DIAGNOSIS:  Muscle invasive bladder cancer.  PROCEDURES: 1. Cystoscopy with indocyanine green dye injection. 2. Robotic-assisted laparoscopic radical cystoprostatectomy with     bilateral pelvic lymphadenectomy and ileal conduit urinary     diversion.  ASSISTANT:  Debbrah Alar, PA.  ESTIMATED BLOOD LOSS:  350 mL.  COMPLICATION:  None.  SPECIMENS: 1. Left distal ureteral margin negative for frozen section. 2. Left final distal ureteral margin. 3. Right distal ureteral margin negative for frozen section. 4. Right final distal ureteral margin. 5. Cystoprostatectomy. 6. Right external iliac lymph nodes. 7. Right obturator lymph nodes. 8. Right internal iliac lymph node, sentinel. 9. Right common iliac lymph node, sentinel. 10.Left external iliac lymph nodes. 11.Left obturator lymph nodes. 12.Left internal iliac lymph nodes. 13.Left common iliac lymph nodes. 14.Left seminal vesicle.  FINDINGS: 1. Minimal residual evidence of intraluminal bladder tumor with     cystoscopy. 2. Sentinel lymph nodes seen in the right common and right internal     iliac lymph node groups, these were designated as such.  DRAINS: 1. Jackson-Pratt drain to bulb suction. 2. Right lower quadrant urostomy to gravity drainage with Bander     stents; right is red, left is blue.  INDICATIONS:  Jeffery Ponce is a pleasant 71 year old gentleman with recent history of muscle invasive bladder cancer.  He was referred for possible cystoprostatectomy.  He had had dose reduced neoadjuvant therapy via the [facility] at [facility] and tolerated this fairly well.  We re- discussed the options including continued path of curative intent  with cystectomy, and he wished to proceed.  Informed consent was obtained and placed in the medical record.  Notably, he was admitted preoperatively 1 day, where he completed bowel prep, stomal marking, and labs.  PROCEDURE IN DETAIL:  The patient being, Jeffery Ponce, was verified. Procedure being, robotic cystoprostatectomy, was confirmed.  Procedure was carried out.  Time-out was performed.  Intravenous antibiotics were administered.  General endotracheal anesthesia introduced.  The patient was placed into a low lithotomy position and sterile field was created by prepping and draping the patient's penis, perineum, proximal thighs using iodine and his infra-xiphoid abdomen using chlorhexidine gluconate.  His arms were carefully tucked at his side using a gel roll padding.  His common peroneal nerves were padded on the lateral aspect of the knee.  He was further fashioned to the operating table using 3- inch tape over foam padding across his supraxiphoid chest.  A test of steep Trendelenburg positioning was performed and he was found to be suitably positioned.  Cystourethroscopy was performed using a 23-French injection cystoscope set.  Inspection of the anterior and posterior urethra was unremarkable. Inspection of the urinary bladder revealed minimal evidence of intraluminal residual bladder tumor with excellent response to neoadjuvant chemotherapy.  Indocyanine green  dye 2 mL was injected into 0.5 mL aliquots in the area of the trigone and creating mucosal blebs for later sentinel lymph node angiography.  Foley catheter was placed then per urethra to straight drain and a high-flow, low pressure pneumoperitoneum was obtained using Veress technique in the supraumbilical midline having passed the aspiration and drop test.  An 8-mm robotic camera port was then placed in the same location.  Additional ports were placed as follows:  Right paramedian 8 mm robotic port, right paramedian 15  mm assistant port at the site of the previous marked stoma, right far lateral 12 mm AirSeal assistant port, left paramedian 8-mm robotic port, left far lateral 8 mm robotic port.  Robot was docked and passed through the electronic checks.  Attention was directed at development of the left retroperitoneum.  Incision was made lateral to the descending colon from the area of the inferior fibers of the diaphragm and 12th rib towards the area of the iliac crossing and then lateral to left medial umbilical ligament towards the anterior wall and this created a posterior peritoneal flap, which was carefully developed medially.  Ureter was then noted as it coursed over the iliac vessels.  Vessel loop was applied around this and it was dissected distally to area of the ureterovesical junction, which was doubly clipped and ligated with the proximal one being a tag suture dyed.  The area was then dissected proximally for a distance of approximately 5 cm above the area of the iliac crossing taking exquisite care to avoid devascularization of the ureter, which did not occur.  Attention was then directed at lymphadenectomy on the left side.  Sentinel lymphangiography on the left side of the pelvis revealed no obvious sentinel lymph nodes or vessels. As such, standard template lymphadenectomy was performed on the left, first to the common iliac lymph nodes with confines being aortic bifurcation, an iliac bifurcation, and a common iliac artery. Lymphostasis was achieved with cold clips, set aside, labeled left common iliac lymph nodes.  Left external iliac group was dissected with confines being left external iliac artery, vein, pelvic sidewall, and iliac bifurcation. Lymphostasis again achieved with cold clips.  Next, left obturator group was dissected with confines being left external iliac vein, pelvic sidewall, obturator nerve.  Lymphostasis was achieved with cold clips.  Exquisite care was taken  to avoid injury to the obturator nerve, which did not occur.  Next, the fibrofatty tissue overlying the area of the internal iliac vessels from the area of the aortic bifurcation to the area of superior vesical artery was dissected free, labeled left internal iliac lymph nodes.  Attention was directed at right-sided retroperitoneal dissection.  Incision was made lateral to the ascending colon, just lateral to the cecum inferiorly coursing over the area of the iliac vessels and then lateral to the right medial umbilical ligament.  The right ureter was encountered as it coursed over the iliac vessels, dissected distally to the ureterovesical junction, was doubly clipped and ligated, with the proximal end being an undyed tag suture.  Frozen section negative for carcinoma as it was on the left.  The ureter was then mobilized to distance approximately 2 cm above the iliac crossing on the right, brought out of the pelvis.  The right pelvis was then inspected under near-infrared fluorescence light and sentinel lymphangiography was performed.  Sentinel lymphangiography on the right side of the pelvis did reveal several sentinel lymph nodes in the right common iliac and right internal iliac lymph node  groups respectively.  A template lymphadenectomy was performed on the right side as per the left, both the sentinel ones labeled as such.  Great care was taken to avoid injury to the obturator nerve on the right, which also did not occur.  The ileocecal junction was identified and a segment of bowel approximately 15 cm proximal to this was noted and felt to have excellent mesenteric mobility to allow for conduit formation, and this was marked with a tag silk suture and a separate clip noting proximal orientation.  Posterior peritoneal flap was created by connecting 2 previous posterior peritoneal incisions behind the bladder and posterior peritoneal flap was created dissecting directly inferior  to the vas deferens, seminal vesicles, and then to the area of the prostatic apex.  This exposed the vascular pedicles of the bladder bilaterally, which were controlled using white load stapler x2 each side.  There was some small amount of seminal vesicle tissue, which was still remained in the staple line on left side, this dissected free and labeled as such, set aside for a separate pathologic specimen.  Anterior dissection was performed by releasing the space of Retzius.  Dissection towards the area of the dorsal venous complex, which was controlled using a green load stapler. Final apical dissection was performed in the anterior plane.  The Foley catheter was identified as the membranous urethra, which was transected. It was used a bucket-handle, and final apical attachments were taken down.  This completely freed up the cystoprostatectomy.  Specimen was placed in extra large EndoCatch bag for later retrieval.  At this point, sponge and needle counts were correct.  Hemostasis appeared excellent.  Closed suction drain was brought through previous left lateral most robotic port site near the peritoneal cavity.  The left ureter was retroperitonealized just anterior to the aortic bifurcation to the right side of the pelvis and the tag sutures of the left and right ureter and conduit bowel were all clipped and self locking laparoscopic grasper via the 15-mm port, and the specimen bag was clipped with a self-locking grasper via the left paramedian 8-mm robotic port.  Robot was then undocked.  Specimen retrieved by extending the previous camera port site inferiorly for distance approximately 8 cm ering on the left side of the umbilicus.  A wound protector was applied at the area of the peritoneal cavity, and the cystoprostatectomy specimen was carefully removed and set aside for permanent pathology.  Next, the right and left ureter were delivered through the wound protector into the  operative field as was the area of the conduit bowel. The area of conduit bowel was carefully inspected and appeared to be suitable.  It was taken out of continuity using bowel load stapler x2 and the mesentery developed with 2 loads of white stapler distally, 1 load proximally, taking exquisite care to avoid devascularization of the conduit or anastomotic segments, which did not occur.  The conduit segment was then laid in the retroperitoneal orientation, and bowel- bowel anastomosis was performed in a side-to-side fashion using 2 loads of the green load stapler.  The free end was oversewn using a running silk and then imbricated with second layer of running silk.  The acute angle of the anastomosis was bolstered with interrupted silk and the mesenteric defect was closed using interrupted silk to avoid internal hernia, but also not so tight to strangulate the blood supply to the mesenteric or anastomotic segments.  The proximal staple line of the conduit was excluded using running Vicryl.  Distal staple line was removed.  Attention directed at the ureteroileal anastomosis first of the right ureter.  A final section was sent for permanent pathology and marked as a true margin.  It was spatulated for distance approximately 1 cm.  A suitable segment on the proximal bowel segment was chosen for anastomosis of the right ureter and approximately 5-mm segment of bowel serosa and mucosa was excised.  Four mucosal everting sutures were applied of interrupted Vicryl, and a heel stitch was applied of Vicryl in 2 separate running suture lines of running Vicryl were applied resulting in excellent ureteroileal anastomosis with mucosa-to-mucosa apposition.  A red color Bander stent was placed to 30 cm to anastomosis on the right side and a single chromic stitch was applied through and through the Bander stent in the bowel to avoid inadvertent early disruption.  Left ureteroileal anastomosis was then  performed as per the right side in exactly same fashion with a blue-colored Bander stent to 28 cm to the anastomosis.  The previous 15 mm port site, which was also chosen for conduit site was then excised to the level of skin and a column of fat approximately quarter size diameter was developed towards the area of the fascia which was dilated to accommodate 2 surgeon's fingers.  The Bander stents and bowel were delivered through this.  The bowel was anchored to the fascia using interrupted Vicryl x4 to prevent peristomal hernia and then rose budding was performed using interrupted Vicryl x4 with intervening 3-0 Vicryl to provide skin to bowel apposition.  All surgeon and assistants changed gloves at this point, and omentum was brought over the area of specimen retrieval and retractor was removed.  All sponge and needle counts were correct.  The ureteral anastomoses were once again reviewed.  The resection site appeared to be suitably patent.  Fascia was reapproximated using figure- of-eight PDS x6, followed by reapproximation of Scarpa's with running Vicryl.  All incision sites were infiltrated with dilute lyophilized Marcaine and closed at the level of the skin using subcuticular Monocryl, followed by Dermabond.  Drain stitch was applied, procedure was terminated.  The patient procedure well.  There were no immediate periprocedural complications.  The patient was taken to the postanesthesia care unit in stable condition with plan for a step-down admission.  Please note, first assistant, Debbrah Alar, was actually crucial for all robotic and open portions of the procedure today.  She provided invaluable retraction, suctioning, clip application, vascular stapling, bowel anastomotic stapling without which this would not be possible.          ______________________________ Alexis Frock, MD     TM/MEDQ  D:  07/30/2017  T:  07/30/2017  Job:  [id number]

## 2017-07-31 NOTE — Progress Notes (Signed)
1 Day Post-Op   Subjective/Chief Complaint:   1 - High Grade Bladder Cancer - s/p robotic cystoprostatectomy + ileal conduit urinary diversion + ICG sentinal / template pelvic lymphadenectomy 07/30/17. Path penidng  2 - Ileus - s/p bowel anastomosis as part fo bladder cancer surgery 8/21. Received entereg pre/post-op. NPO with ice chips initially post-op.  3 - Disposition / Rehab - pt independent at baseline. PT eval pending. He has new urostomy with ostomy RN following in house.  Today "Jeffery Ponce" is stable. Pain control difficult, but otherwise w/o complaints. Hgb 10's, Cr <1.5. UOP acceptable.    Objective: Vital signs in last 24 hours: Temp:  [97.8 F (36.6 C)-98.4 F (36.9 C)] 98 F (36.7 C) (08/23 0322) Pulse Rate:  [70-93] 78 (08/23 0500) Resp:  [10-23] 10 (08/23 0500) BP: (128-170)/(52-72) 139/65 (08/23 0500) SpO2:  [94 %-100 %] 97 % (08/23 0500) Weight:  [77 kg (169 lb 12.1 oz)-78.5 kg (173 lb)] 77 kg (169 lb 12.1 oz) (08/22 1640) Last BM Date: 07/29/17  Intake/Output from previous day: 08/22 0701 - 08/23 0700 In: 4806.7 [I.V.:4586.7; IV Piggyback:100] Out: 980 [Urine:325; Drains:305; Blood:350] Intake/Output this shift: Total I/O In: 1100 [I.V.:1100] Out: 120 [Drains:120]  General appearance: alert, cooperative and appears stated age Eyes: negative Nose: Nares normal. Septum midline. Mucosa normal. No drainage or sinus tenderness. Throat: lips, mucosa, and tongue normal; teeth and gums normal Resp: non-labored on minimal NCO2 Cardio: Nl rate by bedside monitor.  GI: soft, non-tender; bowel sounds normal; no masses,  no organomegaly Male genitalia: normal Extremities: extremities normal, atraumatic, no cyanosis or edema Pulses: 2+ and symmetric Skin: Skin color, texture, turgor normal. No rashes or lesions Lymph nodes: Cervical, supraclavicular, and axillary nodes normal. Neurologic: Grossly normal Incision/Wound: RLQ Urostomy pink / patent with light pink urine  and distal bander stents in place. LLQ JP with serosanguinous fluid that is non-foul.  All incision sites c/d/i.   Lab Results:   Recent Labs  07/29/17 1414 07/30/17 1450 07/31/17 0359  WBC 8.8  --   --   HGB 12.3* 11.6* 10.6*  HCT 37.2* 35.5* 32.4*  PLT 187  --   --    BMET  Recent Labs  07/29/17 1414 07/31/17 0359  NA 139 138  K 4.1 5.0  CL 106 108  CO2 24 25  GLUCOSE 91 109*  BUN 17 21*  CREATININE 1.42* 1.34*  CALCIUM 9.0 8.1*   PT/INR No results for input(s): LABPROT, INR in the last 72 hours. ABG No results for input(s): PHART, HCO3 in the last 72 hours.  Invalid input(s): PCO2, PO2  Studies/Results: No results found.  Anti-infectives: Anti-infectives    Start     Dose/Rate Route Frequency Ordered Stop   07/30/17 0700  ciprofloxacin (CIPRO) IVPB 400 mg     400 mg 200 mL/hr over 60 Minutes Intravenous 60 min pre-op 07/29/17 0949 07/30/17 0942   07/30/17 0700  clindamycin (CLEOCIN) IVPB 900 mg     900 mg 100 mL/hr over 30 Minutes Intravenous  Once 07/29/17 0949 07/30/17 0912   07/29/17 1400  neomycin (MYCIFRADIN) tablet 1,000 mg  Status:  Discontinued     1,000 mg Oral Every 8 hours 07/29/17 1252 07/30/17 1359   07/29/17 1400  metroNIDAZOLE (FLAGYL) tablet 500 mg  Status:  Discontinued     500 mg Oral Every 8 hours 07/29/17 1252 07/30/17 1359      Assessment/Plan:  1 - High Grade Bladder Cancer - doing well POD 1. Transfer to med  surg floor.   2 - Ileus - NPO with ice ships for now. LIkely advance clears tomorrow if no emesis.   3 - Disposition / Rehab - PT eval pending. He will need at least [facility] for new urostomy teaching at discharge, likely early next week.   [facility], Analysa Nutting 07/31/2017

## 2017-07-31 NOTE — Consult Note (Signed)
[place] Nurse ostomy consult note Stoma type/location: RLQ Ileal conduit.  No convex urostomy pouches on the unit.  Have been ordered from central supply and will be implemented at next pouch change. Barrier ring implemented today Stomal assessment/size: 1 1/4" pink patent, red and blue stents in place and draining blood tinged urine.   Peristomal assessment: Intact.  Creasing noted at 3 and 9 o'clock.  Will implement barrier ring and convex urostomy pouch for secure fit.  Treatment options for stomal/peristomal skin: barrier ring  Convex pouch Output blood tinged urine Ostomy pouching: 1pc convex pouch with barrier ring.  Education provided: spouse and family friend at bedside.  Pouch change is performed and wife and patient ask appropriate questions regarding frequency of pouch changes, function of barrier ring and convex pouch and connection to bedside drainage. Understand to disconnect from bedside drainage when OOB for safety.  Stoma is measured, cleansed and pouch barrier is cut to fit.  Wife and patient verbalize that they can learn this and are eager.  Written materials left at bedside.  Enrolled patient in [place] program: No WOC team will follow.   Domenic Moras RN BSN [place] Pager [phone]

## 2017-07-31 NOTE — Evaluation (Signed)
Physical Therapy Evaluation Patient Details Name: Jeffery Ponce MRN: [id number] DOB: Apr 29, 1946 Today's Date: 07/31/2017   History of Present Illness  Pt is s/p robot assisted laparoscopic radical cystoprostatectomy with bilateral pelvic lymphadenectomy and ileal conduit. Pt with hx of bladder cancer.   Clinical Impression  Patient is s/p the above surgery resulting in functional limitations due to the deficits listed below. Pt will benefit from skilled PT to increase their independence and safety with mobility to allow discharge. Pt reports pain 10/10 but willing and able to attempt ambulation at this time. Pt tolerated session well and ambulated using the IV pole for support as he denied the use of an AD at home.      Follow Up Recommendations Home health PT;Supervision - Intermittent    Equipment Recommendations  None recommended by PT    Recommendations for Other Services       Precautions / Restrictions Precautions Precautions: Fall Precaution Comments: JP drain, urostomy      Mobility  Bed Mobility Overal bed mobility: Needs Assistance Bed Mobility: Supine to Sit     Supine to sit: HOB elevated;Min assist     General bed mobility comments: Pt requires min assist for UE support to raise and steady trunk at EOB   Transfers Overall transfer level: Needs assistance Equipment used: None Transfers: Sit to/from Stand Sit to Stand: Min guard         General transfer comment: Pt able to stand and steady by self, min guard provided for safety   Ambulation/Gait Ambulation/Gait assistance: Min guard Ambulation Distance (Feet): 80 Feet Assistive device:  (Pt held on to IV pole while ambulating ) Gait Pattern/deviations: Step-through pattern;Decreased stride length Gait velocity: decr   General Gait Details: Pt reports pain constant at 10/10 throughout session and during ambulation, pt willing and able to ambulate 80 ft before requesting return to room, pt denies use of  AD but wanted to hold on to IV pole during ambulation  Stairs            Wheelchair Mobility    Modified Rankin (Stroke Patients Only)       Balance Overall balance assessment: Needs assistance Sitting-balance support: Feet supported Sitting balance-Leahy Scale: Fair       Standing balance-Leahy Scale: Fair Standing balance comment: Pt requires unilateral UE support for dynamic activity                              Pertinent Vitals/Pain Pain Assessment: 0-10 Pain Score: 10-Worst pain ever Pain Location: Abdomen and groin Pain Descriptors / Indicators: Sore;Operative site guarding;Discomfort;Sharp Pain Intervention(s): Premedicated before session;PCA encouraged;Limited activity within patient's tolerance;Monitored during session;Repositioned;Ice applied    Home Living Family/patient expects to be discharged to:: Private residence Living Arrangements: Spouse/significant other Available Help at Discharge: Family Type of Home: House Home Access: Stairs to enter Entrance Stairs-Rails: Right Entrance Stairs-Number of Steps: 3 Home Layout: One level Home Equipment: None Additional Comments: Pt reports being previously independent     Prior Function Level of Independence: Independent               Hand Dominance        Extremity/Trunk Assessment   Upper Extremity Assessment Upper Extremity Assessment: Overall WFL for tasks assessed    Lower Extremity Assessment Lower Extremity Assessment: Overall WFL for tasks assessed       Communication   Communication: No difficulties  Cognition Arousal/Alertness: Awake/alert Behavior During Therapy:  WFL for tasks assessed/performed Overall Cognitive Status: Within Functional Limits for tasks assessed                                        General Comments      Exercises     Assessment/Plan    PT Assessment Patient needs continued PT services  PT Problem List Decreased  mobility;Decreased safety awareness;Decreased activity tolerance;Decreased balance;Decreased knowledge of use of DME       PT Treatment Interventions Therapeutic activities;Gait training;Therapeutic exercise;Patient/family education;Balance training;Functional mobility training;DME instruction    PT Goals (Current goals can be found in the Care Plan section)  Acute Rehab PT Goals Patient Stated Goal: Pt would like to return home and to baseline functional activity including gardening  PT Goal Formulation: With patient Time For Goal Achievement: 08/14/17 Potential to Achieve Goals: Good    Frequency Min 3X/week   Barriers to discharge        Co-evaluation               AM-PAC PT "6 Clicks" Daily Activity  Outcome Measure Difficulty turning over in bed (including adjusting bedclothes, sheets and blankets)?: None Difficulty moving from lying on back to sitting on the side of the bed? : A Little Difficulty sitting down on and standing up from a chair with arms (e.g., wheelchair, bedside commode, etc,.)?: A Little Help needed moving to and from a bed to chair (including a wheelchair)?: A Little Help needed walking in hospital room?: A Little Help needed climbing 3-5 steps with a railing? : A Lot 6 Click Score: 18    End of Session Equipment Utilized During Treatment: Other (comment) (Pt did not want to use gait belt due to port a cath ) Activity Tolerance: Patient tolerated treatment well Patient left: in chair;with chair alarm set;with call bell/phone within reach Nurse Communication: Mobility status PT Visit Diagnosis: Difficulty in walking, not elsewhere classified (R26.2)    Time: 7829-5621 PT Time Calculation (min) (ACUTE ONLY): 28 min   Charges:   PT Evaluation $PT Eval Low Complexity: 1 Low     PT G CodesOlegario Ponce, SPT   Jeffery Ponce 07/31/2017, 1:57 PM

## 2017-08-01 LAB — BASIC METABOLIC PANEL
ANION GAP: 6 (ref 5–15)
BUN: 19 mg/dL (ref 6–20)
CO2: 25 mmol/L (ref 22–32)
Calcium: 8.5 mg/dL — ABNORMAL LOW (ref 8.9–10.3)
Chloride: 106 mmol/L (ref 101–111)
Creatinine, Ser: 1.15 mg/dL (ref 0.61–1.24)
GLUCOSE: 120 mg/dL — AB (ref 65–99)
Potassium: 4.2 mmol/L (ref 3.5–5.1)
SODIUM: 137 mmol/L (ref 135–145)

## 2017-08-01 LAB — HEMOGLOBIN AND HEMATOCRIT, BLOOD
HCT: 33.5 % — ABNORMAL LOW (ref 39.0–52.0)
Hemoglobin: 10.8 g/dL — ABNORMAL LOW (ref 13.0–17.0)

## 2017-08-01 MED ORDER — OXYCODONE HCL 5 MG PO TABS
10.0000 mg | ORAL_TABLET | ORAL | Status: DC | PRN
  Administered 2017-08-01 – 2017-08-05 (×18): 10 mg via ORAL
  Filled 2017-08-01 (×18): qty 2

## 2017-08-01 NOTE — Progress Notes (Signed)
Patient complained of increased pain 8/10 with PCA use. Pt request to have oral medication that is longer acting. Provider contacted, order received to give Oxycodone 10 mg every 4 hours as needed for sever pain.

## 2017-08-01 NOTE — Plan of Care (Signed)
Problem: Pain Managment: Goal: General experience of comfort will improve Outcome: Not Progressing Discussed pain goals with patient. Goal has not been met although PCA use has been continued. Pt c/o pain 8/10. Provider contacted to assess

## 2017-08-01 NOTE — Progress Notes (Signed)
2 Days Post-Op   Subjective/Chief Complaint:  1 - High Grade Bladder Cancer - s/p robotic cystoprostatectomy + ileal conduit urinary diversion + ICG sentinal / template pelvic lymphadenectomy 07/30/17. Stepdown POD 0, transferred go med-surg floor 8/23. Path penidng  2 - Ileus - s/p bowel anastomosis as part fo bladder cancer surgery 8/21. Received entereg pre/post-op. NPO with ice chips initially post-op. Advancing to clears 8/24.   3 - Disposition / Rehab - pt independent at baseline. PT eval 8/23. He has new urostomy with ostomy RN following in house. He is very motivated and compliant with walking.  Today "Jeffery Ponce" is without complaints. Still very sore but ambulating in hall x several yesterday. No emesis. Feels hungry.    Objective: Vital signs in last 24 hours: Temp:  [97.5 F (36.4 C)-98.8 F (37.1 C)] 98 F (36.7 C) (08/24 0622) Pulse Rate:  [77-104] 104 (08/24 0622) Resp:  [12-18] 18 (08/24 0622) BP: (128-170)/(46-79) 156/69 (08/24 0622) SpO2:  [90 %-98 %] 96 % (08/24 0622) Last BM Date: 07/29/17  Intake/Output from previous day: 08/23 0701 - 08/24 0700 In: 2108.4 [I.V.:2108.4] Out: 2790 [Urine:2700; Drains:90] Intake/Output this shift: Total I/O In: 1100 [I.V.:1100] Out: 2105 [Urine:2100; Drains:5]  General appearance: alert, cooperative, appears stated age and very vigorous for age and s/p major surgery.  Eyes: negative Nose: Nares normal. Septum midline. Mucosa normal. No drainage or sinus tenderness. Throat: lips, mucosa, and tongue normal; teeth and gums normal Neck: supple, symmetrical, trachea midline Back: symmetric, no curvature. ROM normal. No CVA tenderness. Resp: non-labored on minimal [place] O2 Cardio: midl regular tachycardia.  GI: soft, non-tender; bowel sounds normal; no masses,  no organomegaly Male genitalia: normal Extremities: extremities normal, atraumatic, no cyanosis or edema Pulses: 2+ and symmetric Skin: Skin color, texture, turgor normal. No  rashes or lesions Lymph nodes: Cervical, supraclavicular, and axillary nodes normal. Neurologic: Grossly normal Incision/Wound: recent incision sites c/d/i. JP with minimal serosanguinous fluid. RLQ Urostomy pink / patent of light pink urine with distal stent in place. Left [place] port accessed and c/d/i.   Lab Results:   Recent Labs  07/29/17 1414  07/31/17 0359 08/01/17 0308  WBC 8.8  --   --   --   HGB 12.3*  < > 10.6* 10.8*  HCT 37.2*  < > 32.4* 33.5*  PLT 187  --   --   --   < > = values in this interval not displayed. BMET  Recent Labs  07/31/17 0359 08/01/17 0308  NA 138 137  K 5.0 4.2  CL 108 106  CO2 25 25  GLUCOSE 109* 120*  BUN 21* 19  CREATININE 1.34* 1.15  CALCIUM 8.1* 8.5*   PT/INR No results for input(s): LABPROT, INR in the last 72 hours. ABG No results for input(s): PHART, HCO3 in the last 72 hours.  Invalid input(s): PCO2, PO2  Studies/Results: No results found.  Anti-infectives: Anti-infectives    Start     Dose/Rate Route Frequency Ordered Stop   07/30/17 0700  ciprofloxacin (CIPRO) IVPB 400 mg     400 mg 200 mL/hr over 60 Minutes Intravenous 60 min pre-op 07/29/17 0949 07/30/17 0942   07/30/17 0700  clindamycin (CLEOCIN) IVPB 900 mg     900 mg 100 mL/hr over 30 Minutes Intravenous  Once 07/29/17 0949 07/30/17 0912   07/29/17 1400  neomycin (MYCIFRADIN) tablet 1,000 mg  Status:  Discontinued     1,000 mg Oral Every 8 hours 07/29/17 1252 07/30/17 1359   07/29/17 1400  metroNIDAZOLE (FLAGYL) tablet 500 mg  Status:  Discontinued     500 mg Oral Every 8 hours 07/29/17 1252 07/30/17 1359      Assessment/Plan:  1 - High Grade Bladder Cancer - doing well POD 2. Continue ambulation, IVF to 1/2 MIVF and begin clears.   2 - Ileus - advance to clears.   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 He will also need HHRN for new urostomy teaching at discharge, likely early next week.   [facility], Birttany Dechellis 08/01/2017

## 2017-08-01 NOTE — Progress Notes (Signed)
Spoke with pt concerning [place] needs. Pt was not sure at this point. Pt would benefit with HHRN. Will need HHRN orders and a face to face. Please.

## 2017-08-01 NOTE — Plan of Care (Signed)
Problem: Skin Integrity: Goal: Risk for impaired skin integrity will decrease Outcome: Completed/Met Date Met: 08/01/17 Patient is currently on air mattress. Patient has been cleansed, cream applied.

## 2017-08-01 NOTE — Plan of Care (Signed)
Problem: Activity: Goal: Ability to tolerate increased activity will improve Outcome: Completed/Met Date Met: 08/01/17 Patient ambulated 100 ft with use of walker and tolerated activity well

## 2017-08-01 NOTE — Progress Notes (Signed)
Patient ambulated x1 this shift, tolerated well.

## 2017-08-01 NOTE — Consult Note (Signed)
[place] Nurse ostomy consult note Stoma type/location: RLQ Ileal conduit.  Convex urostomy pouches on the unit in case needed over weekend.  Currently has on a flat pouch with barrier ring placed yesterday 8/23. No leakage at site, attached correctly to bedside drainage bag. Stomal assessment/size: stoma is pink,, red and blue stents coiled in bag, urine is still  blood tinged.  Peristomal assessment: Treatment options for stomal/peristomal skin: barrier ring and Convex pouch for next change. Output: blood tinged urine Ostomy pouching: 1pc convex pouch with barrier ring for next change.  Education provided: Patient alone this am. Explained I was just making sure everything is good before the weekend. Reviewed the emptying process as well as the bedside bag connector and the piece to open and close pouch. Pt states he remembers the teaching from yesterday.  He asked appropriate questions and is concerned about needing to do physical labor when he is discharged. 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  Melody brought in a book about a belt that pt will be fitted for, Santiago Glad had discussed with him yesterday. Pt states he knows he was told to disconnect from bag for ambulating but while in hospital he is just leaving it hooked.  Demonstrated how to open, close, disconnect and reconnect. Complains about PCA not helping pain like the Oxycodone was. Spoke with bedside RN who will contact MD about med changes.  Enrolled patient in [place] program: No We will follow this patient and remain available to this patient, nursing, and the medical and surgical teams.  Fara Olden, RN-C, WTA-C, [place] Ost[facility] Care Associate

## 2017-08-02 LAB — BASIC METABOLIC PANEL
ANION GAP: 7 (ref 5–15)
BUN: 13 mg/dL (ref 6–20)
CALCIUM: 8.6 mg/dL — AB (ref 8.9–10.3)
CO2: 26 mmol/L (ref 22–32)
Chloride: 106 mmol/L (ref 101–111)
Creatinine, Ser: 1.14 mg/dL (ref 0.61–1.24)
Glucose, Bld: 119 mg/dL — ABNORMAL HIGH (ref 65–99)
POTASSIUM: 3.7 mmol/L (ref 3.5–5.1)
Sodium: 139 mmol/L (ref 135–145)

## 2017-08-02 LAB — HEMOGLOBIN AND HEMATOCRIT, BLOOD
HEMATOCRIT: 34.4 % — AB (ref 39.0–52.0)
Hemoglobin: 11 g/dL — ABNORMAL LOW (ref 13.0–17.0)

## 2017-08-02 MED ORDER — DIAZEPAM 5 MG PO TABS
10.0000 mg | ORAL_TABLET | Freq: Three times a day (TID) | ORAL | Status: DC | PRN
  Administered 2017-08-02 – 2017-08-05 (×2): 10 mg via ORAL
  Filled 2017-08-02 (×2): qty 2

## 2017-08-02 NOTE — Progress Notes (Signed)
3 Days Post-Op   Subjective/Chief Complaint:  1 - High Grade Bladder Cancer - s/p robotic cystoprostatectomy + ileal conduit urinary diversion + ICG sentinal / template pelvic lymphadenectomy 07/30/17. Stepdown POD 0, transferred go med-surg floor 8/23. Path penidng  2 - Ileus - s/p bowel anastomosis as part fo bladder cancer surgery 8/21. Received entereg pre/post-op. NPO with ice chips initially post-op. Advancing to clears 8/24.   3 - Disposition / Rehab - pt independent at baseline. PT eval 8/23. He has new urostomy with ostomy RN following in house. He is very motivated and compliant with walking.  Today "Dossie" is stable. Continuing to ambulate. Some abdominal "rumbling" this AM but no flatus yet.    Objective: Vital signs in last 24 hours: Temp:  [97.7 F (36.5 C)-98.3 F (36.8 C)] 97.7 F (36.5 C) (08/25 0552) Pulse Rate:  [96-107] 107 (08/25 0552) Resp:  [14-33] 33 (08/25 0629) BP: (123-135)/(74-87) 135/78 (08/25 0552) SpO2:  [95 %-98 %] 97 % (08/25 0629) Last BM Date: 07/29/17  Intake/Output from previous day: 08/24 0701 - 08/25 0700 In: 2104.6 [P.O.:360; I.V.:1739.6] Out: 1010 [Urine:1000; Drains:10] Intake/Output this shift: No intake/output data recorded.  General appearance: alert, cooperative and appears stated age Eyes: negative Nose: Nares normal. Septum midline. Mucosa normal. No drainage or sinus tenderness., [place] O2 Throat: lips, mucosa, and tongue normal; teeth and gums normal Neck: supple, symmetrical, trachea midline Resp: non-labored on minimal [place] O2 Cardio: Nl rate at present.  GI: soft, non-tender; bowel sounds normal; no masses,  no organomegaly Extremities: extremities normal, atraumatic, no cyanosis or edema Skin: Skin color, texture, turgor normal. No rashes or lesions Lymph nodes: Cervical, supraclavicular, and axillary nodes normal. Neurologic: Grossly normal Incision/Wound: recent port sites / extraction sites c/d/i. RLQ Urostomy pink and  patent with copious urine and distal bander stents in situ. JP with scant serous drainage.   Lab Results:   Recent Labs  08/01/17 0308 08/02/17 0405  HGB 10.8* 11.0*  HCT 33.5* 34.4*   BMET  Recent Labs  08/01/17 0308 08/02/17 0405  NA 137 139  K 4.2 3.7  CL 106 106  CO2 25 26  GLUCOSE 120* 119*  BUN 19 13  CREATININE 1.15 1.14  CALCIUM 8.5* 8.6*   PT/INR No results for input(s): LABPROT, INR in the last 72 hours. ABG No results for input(s): PHART, HCO3 in the last 72 hours.  Invalid input(s): PCO2, PO2  Studies/Results: No results found.  Anti-infectives: Anti-infectives    Start     Dose/Rate Route Frequency Ordered Stop   07/30/17 0700  ciprofloxacin (CIPRO) IVPB 400 mg     400 mg 200 mL/hr over 60 Minutes Intravenous 60 min pre-op 07/29/17 0949 07/30/17 0942   07/30/17 0700  clindamycin (CLEOCIN) IVPB 900 mg     900 mg 100 mL/hr over 30 Minutes Intravenous  Once 07/29/17 0949 07/30/17 0912   07/29/17 1400  neomycin (MYCIFRADIN) tablet 1,000 mg  Status:  Discontinued     1,000 mg Oral Every 8 hours 07/29/17 1252 07/30/17 1359   07/29/17 1400  metroNIDAZOLE (FLAGYL) tablet 500 mg  Status:  Discontinued     500 mg Oral Every 8 hours 07/29/17 1252 07/30/17 1359      Assessment/Plan:  3 Days Post-Op   Subjective/Chief Complaint:  1 - High Grade Bladder Cancer - s/p robotic cystoprostatectomy + ileal conduit urinary diversion + ICG sentinal / template pelvic lymphadenectomy 07/30/17. Stepdown POD 0, transferred go med-surg floor 8/23. Path penidng  2 -  Ileus - s/p bowel anastomosis as part fo bladder cancer surgery 8/21. Received entereg pre/post-op. NPO with ice chips initially post-op. Advancing to clears 8/24.   3 - Disposition / Rehab - pt independent at baseline. PT eval 8/23. He has new urostomy with ostomy RN following in house. He is very motivated and compliant with walking.  Today "Jonny" is without complaints. Still very sore but ambulating in  hall x several yesterday. No emesis. Feels hungry.    Objective: Vital signs in last 24 hours: Temp:  [97.7 F (36.5 C)-98.3 F (36.8 C)] 97.7 F (36.5 C) (08/25 0552) Pulse Rate:  [96-107] 107 (08/25 0552) Resp:  [14-33] 33 (08/25 0629) BP: (123-135)/(74-87) 135/78 (08/25 0552) SpO2:  [95 %-98 %] 97 % (08/25 0629) Last BM Date: 07/29/17  Intake/Output from previous day: 08/24 0701 - 08/25 0700 In: 2104.6 [P.O.:360; I.V.:1739.6] Out: 1010 [Urine:1000; Drains:10] Intake/Output this shift: No intake/output data recorded.  General appearance: alert, cooperative, appears stated age and very vigorous for age and s/p major surgery.  Eyes: negative Nose: Nares normal. Septum midline. Mucosa normal. No drainage or sinus tenderness. Throat: lips, mucosa, and tongue normal; teeth and gums normal Neck: supple, symmetrical, trachea midline Back: symmetric, no curvature. ROM normal. No CVA tenderness. Resp: non-labored on minimal [place] O2 Cardio: midl regular tachycardia.  GI: soft, non-tender; bowel sounds normal; no masses,  no organomegaly Male genitalia: normal Extremities: extremities normal, atraumatic, no cyanosis or edema Pulses: 2+ and symmetric Skin: Skin color, texture, turgor normal. No rashes or lesions Lymph nodes: Cervical, supraclavicular, and axillary nodes normal. Neurologic: Grossly normal Incision/Wound: recent incision sites c/d/i. JP with minimal serosanguinous fluid. RLQ Urostomy pink / patent of light pink urine with distal stent in place. Left [place] port accessed and c/d/i.   Lab Results:   Recent Labs  08/01/17 0308 08/02/17 0405  HGB 10.8* 11.0*  HCT 33.5* 34.4*   BMET  Recent Labs  08/01/17 0308 08/02/17 0405  NA 137 139  K 4.2 3.7  CL 106 106  CO2 25 26  GLUCOSE 120* 119*  BUN 19 13  CREATININE 1.15 1.14  CALCIUM 8.5* 8.6*   PT/INR No results for input(s): LABPROT, INR in the last 72 hours. ABG No results for input(s): PHART, HCO3 in the  last 72 hours.  Invalid input(s): PCO2, PO2  Studies/Results: No results found.  Anti-infectives: Anti-infectives    Start     Dose/Rate Route Frequency Ordered Stop   07/30/17 0700  ciprofloxacin (CIPRO) IVPB 400 mg     400 mg 200 mL/hr over 60 Minutes Intravenous 60 min pre-op 07/29/17 0949 07/30/17 0942   07/30/17 0700  clindamycin (CLEOCIN) IVPB 900 mg     900 mg 100 mL/hr over 30 Minutes Intravenous  Once 07/29/17 0949 07/30/17 0912   07/29/17 1400  neomycin (MYCIFRADIN) tablet 1,000 mg  Status:  Discontinued     1,000 mg Oral Every 8 hours 07/29/17 1252 07/30/17 1359   07/29/17 1400  metroNIDAZOLE (FLAGYL) tablet 500 mg  Status:  Discontinued     500 mg Oral Every 8 hours 07/29/17 1252 07/30/17 1359      Assessment/Plan:  1 - High Grade Bladder Cancer - doing well POD 3. Continue ambulation and clears.   2 - Ileus - slowly resolving as anticipated. Cotninue clears until flatus or BM, then advance further.   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 He will also need [facility] for new urostomy teaching  at discharge, likely early next week.   [facility], Demitris Pokorny 08/02/2017

## 2017-08-03 LAB — BASIC METABOLIC PANEL
Anion gap: 6 (ref 5–15)
BUN: 16 mg/dL (ref 6–20)
CO2: 26 mmol/L (ref 22–32)
Calcium: 8.5 mg/dL — ABNORMAL LOW (ref 8.9–10.3)
Chloride: 106 mmol/L (ref 101–111)
Creatinine, Ser: 1.37 mg/dL — ABNORMAL HIGH (ref 0.61–1.24)
GFR calc Af Amer: 58 mL/min — ABNORMAL LOW (ref >60)
GFR calc non Af Amer: 50 mL/min — ABNORMAL LOW (ref >60)
Glucose, Bld: 121 mg/dL — ABNORMAL HIGH (ref 65–99)
Potassium: 3.5 mmol/L (ref 3.5–5.1)
Sodium: 138 mmol/L (ref 135–145)

## 2017-08-03 LAB — HEMOGLOBIN AND HEMATOCRIT, BLOOD
HCT: 31.6 % — ABNORMAL LOW (ref 39.0–52.0)
Hemoglobin: 10.5 g/dL — ABNORMAL LOW (ref 13.0–17.0)

## 2017-08-03 MED ORDER — KETOROLAC TROMETHAMINE 15 MG/ML IJ SOLN
15.0000 mg | Freq: Four times a day (QID) | INTRAMUSCULAR | Status: DC
  Administered 2017-08-03 – 2017-08-05 (×9): 15 mg via INTRAVENOUS
  Filled 2017-08-03 (×9): qty 1

## 2017-08-03 MED ORDER — BISACODYL 10 MG RE SUPP: 10.0000 mg | Freq: Every day | RECTAL | Status: DC | PRN

## 2017-08-03 MED ORDER — BISACODYL 10 MG RE SUPP
10.0000 mg | Freq: Once | RECTAL | Status: AC
  Administered 2017-08-03: 10 mg via RECTAL
  Filled 2017-08-03: qty 1

## 2017-08-03 NOTE — Progress Notes (Signed)
4 Days Post-Op   Subjective/Chief Complaint:  1 - High Grade Bladder Cancer - s/p robotic cystoprostatectomy + ileal conduit urinary diversion + ICG sentinal / template pelvic lymphadenectomy 07/30/17. Stepdown POD 0, transferred go med-surg floor 8/23. Path penidng  2 - Ileus - s/p bowel anastomosis as part fo bladder cancer surgery 8/21. Received entereg pre/post-op. NPO with ice chips initially post-op. Advancing to clears 8/24. Flatus starting 8/25, advanced to fulls 8/26.   3 - Disposition / Rehab - pt independent at baseline. PT eval 8/23. He has new urostomy with ostomy RN following in house. He is very motivated and compliant with walking.  Today "Jeffery Ponce" is stable. Now passing gas but no BM's. Pain control still somewhat difficult given extensive pre-op narcotic use, but still able to ambulate.    Objective: Vital signs in last 24 hours: Temp:  [97.8 F (36.6 C)-98.4 F (36.9 C)] 98.4 F (36.9 C) (08/26 1610) Pulse Rate:  [101-115] 108 (08/26 0632) Resp:  [16-19] 16 (08/26 0800) BP: (108-141)/(70-83) 141/83 (08/26 0632) SpO2:  [93 %-97 %] 97 % (08/26 0800) Last BM Date: 07/29/17  Intake/Output from previous day: 08/25 0701 - 08/26 0700 In: 1455.8 [P.O.:480; I.V.:975.8] Out: 528 [Urine:450; Drains:78] Intake/Output this shift: No intake/output data recorded.  General appearance: alert, cooperative and appears stated age Eyes: negative Nose: Nares normal. Septum midline. Mucosa normal. No drainage or sinus tenderness. Neck: supple, symmetrical, trachea midline Back: symmetric, no curvature. ROM normal. No CVA tenderness. Resp: non-labored on minimal [place] O2 Cardio: mild regualr tachycardia.  GI: soft, non-tender; bowel sounds normal; no masses,  no organomegaly Male genitalia: normal Extremities: extremities normal, atraumatic, no cyanosis or edema Pulses: 2+ and symmetric Skin: Skin color, texture, turgor normal. No rashes or lesions Lymph nodes: Cervical,  supraclavicular, and axillary nodes normal. Neurologic: Grossly normal Incision/Wound: recent port and extraction sites c/d/i. No abd distansion. JP with scant serous outptu that is non-foul. RLQ Urostomy pink / patent of yellow urine with some mucus, distal bander stents in place.   Lab Results:   Recent Labs  08/02/17 0405 08/03/17 0422  HGB 11.0* 10.5*  HCT 34.4* 31.6*   BMET  Recent Labs  08/02/17 0405 08/03/17 0422  NA 139 138  K 3.7 3.5  CL 106 106  CO2 26 26  GLUCOSE 119* 121*  BUN 13 16  CREATININE 1.14 1.37*  CALCIUM 8.6* 8.5*   PT/INR No results for input(s): LABPROT, INR in the last 72 hours. ABG No results for input(s): PHART, HCO3 in the last 72 hours.  Invalid input(s): PCO2, PO2  Studies/Results: No results found.  Anti-infectives: Anti-infectives    Start     Dose/Rate Route Frequency Ordered Stop   07/30/17 0700  ciprofloxacin (CIPRO) IVPB 400 mg     400 mg 200 mL/hr over 60 Minutes Intravenous 60 min pre-op 07/29/17 0949 07/30/17 0942   07/30/17 0700  clindamycin (CLEOCIN) IVPB 900 mg     900 mg 100 mL/hr over 30 Minutes Intravenous  Once 07/29/17 0949 07/30/17 0912   07/29/17 1400  neomycin (MYCIFRADIN) tablet 1,000 mg  Status:  Discontinued     1,000 mg Oral Every 8 hours 07/29/17 1252 07/30/17 1359   07/29/17 1400  metroNIDAZOLE (FLAGYL) tablet 500 mg  Status:  Discontinued     500 mg Oral Every 8 hours 07/29/17 1252 07/30/17 1359      Assessment/Plan:  1 - High Grade Bladder Cancer - doing well POD 4. Continue ambulation. Add scheduled ketorolac to pain  meds, in addition to scheduled tylenol and PRN oral and IV meds. Keep PCA for now.    2 - Ileus - slowly resolving as anticipated. Adv to fulls as now with flatus.   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 He will also need HHRN for new urostomy teaching at discharge, likely early next week.   [facility], Jeffery Ponce 08/03/2017

## 2017-08-04 LAB — BASIC METABOLIC PANEL
Anion gap: 4 — ABNORMAL LOW (ref 5–15)
BUN: 23 mg/dL — AB (ref 6–20)
CALCIUM: 8.2 mg/dL — AB (ref 8.9–10.3)
CO2: 24 mmol/L (ref 22–32)
CREATININE: 1.56 mg/dL — AB (ref 0.61–1.24)
Chloride: 109 mmol/L (ref 101–111)
GFR calc Af Amer: 50 mL/min — ABNORMAL LOW (ref >60)
GFR, EST NON AFRICAN AMERICAN: 43 mL/min — AB (ref >60)
GLUCOSE: 104 mg/dL — AB (ref 65–99)
POTASSIUM: 3.7 mmol/L (ref 3.5–5.1)
Sodium: 137 mmol/L (ref 135–145)

## 2017-08-04 LAB — HEMOGLOBIN AND HEMATOCRIT, BLOOD
HCT: 27.5 % — ABNORMAL LOW (ref 39.0–52.0)
HEMOGLOBIN: 9.2 g/dL — AB (ref 13.0–17.0)

## 2017-08-04 NOTE — Care Management Note (Signed)
Case Management Note  Patient Details  Name: Jeffery Ponce MRN: [id number] Date of Birth: 1946-07-09  Subjective/Objective:   New urostomy with ostomy                 Action/Plan: [place] to discharge home with [facility] from [place]   Expected Discharge Date:  Likely 08/05/17               Expected Discharge Plan:  [place]  In-House Referral:     Discharge planning Services  CM Consult  Post Acute Care Choice:    Choice offered to:  Patient, Spouse  DME Arranged:    DME Agency:     HH Arranged:  RN [place] Agency:  [facility] (now [facility])  Status of Service:  Completed, signed off  If discussed at [employer] of Stay Meetings, dates discussed:    Additional CommentsPurcell Mouton, RN 08/04/2017, 1:52 PM

## 2017-08-04 NOTE — Progress Notes (Signed)
5 Days Post-Op   Subjective/Chief Complaint:  1 - High Grade Bladder Cancer - s/p robotic cystoprostatectomy + ileal conduit urinary diversion + ICG sentinal / template pelvic lymphadenectomy 07/30/17. Stepdown POD 0, transferred go med-surg floor 8/23. Path penidng  2 - Ileus - s/p bowel anastomosis as part fo bladder cancer surgery 8/21. Received entereg pre/post-op. NPO with ice chips initially post-op. Advancing to clears 8/24. Flatus starting 8/25, advanced to fulls 8/26 with return of BM. Advanced to reg diet 8/27.   3 - Disposition / Rehab - pt independent at baseline. PT eval 8/23 fins no need for PT at home. He has new urostomy with ostomy RN following in house. He is very motivated and compliant with walking.  Today "Jeffery Ponce" is without complaints. BM x 2 yesterday afternoon, pain controlled, making bed transfers and walking on his own.    Objective: Vital signs in last 24 hours: Temp:  [97.5 F (36.4 C)-98.4 F (36.9 C)] 98 F (36.7 C) (08/27 0319) Pulse Rate:  [87-108] 92 (08/27 0319) Resp:  [15-20] 20 (08/27 0418) BP: (105-141)/(59-83) 115/59 (08/27 0319) SpO2:  [91 %-99 %] 95 % (08/27 0418) Last BM Date: 08/03/17 (per pt)  Intake/Output from previous day: 08/26 0701 - 08/27 0700 In: 1200 [I.V.:1200] Out: 250 [Urine:250] Intake/Output this shift: Total I/O In: 1200 [I.V.:1200] Out: -   General appearance: alert, cooperative and appears stated age Eyes: negative Nose: Nares normal. Septum midline. Mucosa normal. No drainage or sinus tenderness. Neck: supple, symmetrical, trachea midline Back: symmetric, no curvature. ROM normal. No CVA tenderness. Resp: non-labored on room air.  Cardio: mild regualr tachycardia.  GI: soft, non-tender; bowel sounds normal; no masses,  no organomegaly Male genitalia: normal Extremities: extremities normal, atraumatic, no cyanosis or edema Pulses: 2+ and symmetric Skin: Skin color, texture, turgor normal. No rashes or lesions Lymph  nodes: Cervical, supraclavicular, and axillary nodes normal. Neurologic: Grossly normal Incision/Wound: recent port and extraction sites c/d/i. No abd distansion. JP with scant serous outptu that is non-foul. RLQ Urostomy pink / patent of yellow urine with some mucus, distal bander stents in place.   Lab Results:   Recent Labs  08/03/17 0422 08/04/17 0357  HGB 10.5* 9.2*  HCT 31.6* 27.5*   BMET  Recent Labs  08/03/17 0422 08/04/17 0357  NA 138 137  K 3.5 3.7  CL 106 109  CO2 26 24  GLUCOSE 121* 104*  BUN 16 23*  CREATININE 1.37* 1.56*  CALCIUM 8.5* 8.2*   PT/INR No results for input(s): LABPROT, INR in the last 72 hours. ABG No results for input(s): PHART, HCO3 in the last 72 hours.  Invalid input(s): PCO2, PO2  Studies/Results: No results found.  Anti-infectives: Anti-infectives    Start     Dose/Rate Route Frequency Ordered Stop   07/30/17 0700  ciprofloxacin (CIPRO) IVPB 400 mg     400 mg 200 mL/hr over 60 Minutes Intravenous 60 min pre-op 07/29/17 0949 07/30/17 0942   07/30/17 0700  clindamycin (CLEOCIN) IVPB 900 mg     900 mg 100 mL/hr over 30 Minutes Intravenous  Once 07/29/17 0949 07/30/17 0912   07/29/17 1400  neomycin (MYCIFRADIN) tablet 1,000 mg  Status:  Discontinued     1,000 mg Oral Every 8 hours 07/29/17 1252 07/30/17 1359   07/29/17 1400  metroNIDAZOLE (FLAGYL) tablet 500 mg  Status:  Discontinued     500 mg Oral Every 8 hours 07/29/17 1252 07/30/17 1359      Assessment/Plan:  1 - High  Grade Bladder Cancer - doing well POD 5.  Saline lock IV and transition meds to oral, DC PCA.   2 - Ileus - resolved clinically, now on reg diet.   3 - Disposition / Rehab - Face to face and orders completed for home health RN. Likely DC 8/28.   Jeffery Ponce, Jeffery Ponce 08/04/2017

## 2017-08-04 NOTE — Consult Note (Signed)
[place] Nurse ostomy follow up Stoma type/location: RUQ ileal conduit Stomal assessment/size: 1 and 1/2 inches, slightly oval, slightly budded, moist, red with os at center.  Two stents intact, Red = right, Blue = left. Peristomal assessment: intact, clear Treatment options for stomal/peristomal skin: Skin barrier ring, convex urostomy pouch Output: red-tinged urine Ostomy pouching: 1pc.convex pouch, skin barrier ring Education provided: Patient and wife taught pouch preparation after measuring (tracing pattern, cutting). Pouch removal using warm wet washcloth and removal of mucus from stents.  Mucus purpose and presence taught.Taught about others trying to obtain a urine sample from pouch. Reportable signs and symptoms taught. Patient has not been disconnecting and reconnecting from bedside urinary drainage bag and this skill is reassessed today; he required much cueing for this skill. Pouch removed, stoma sized, new pouch prepared and applied. Pouch attached to bedside drainage bag.  Schedule for changing reviewed. Patient and wife are taught that [name] will remove the stents after several weeks in an outpatient visit and that in the meantime, HHRN would be pouching around them. All questions answered today and patient 's wife provided with my contact information in the event they have additional questions following discharge (which is anticipated tomorrow).  Enrolled patient in [place] Discharge program: Yes, today.  4 pouching setups requested.  [place] nursing team will follow while in house, and will remain available to this patient, the nursing and medical teams.  Thanks, Maudie Flakes, MSN, RN, [place], Arther Abbott  Pager# [phone]

## 2017-08-04 NOTE — Care Management Important Message (Signed)
Important Message  Patient Details  Name: Jeffery Ponce MRN: [id number] Date of Birth: August 01, 1946   Medicare Important Message Given:  Yes    Kerin Salen 08/04/2017, 11:41 AMImportant Message  Patient Details  Name: Jeffery Ponce MRN: [id number] Date of Birth: 02-27-1946   Medicare Important Message Given:  Yes    Kerin Salen 08/04/2017, 11:41 AM

## 2017-08-04 NOTE — Progress Notes (Signed)
Physical Therapy Treatment and Discharge Patient Details Name: Jeffery Ponce MRN: [id number] DOB: 18-Feb-1946 Today's Date: 08/04/2017    History of Present Illness Pt is s/p robot assisted laparoscopic radical cystoprostatectomy with bilateral pelvic lymphadenectomy and ileal conduit. Pt with hx of bladder cancer.     PT Comments    RN reports pt has been modified independent in his room.  Pt ambulated in hallway good distance and with no overt gait discrepancies or LOB.  Pt anticipates d/c home tomorrow and agreeable to no further acute PT needs.  Updated d/c recommendations to no follow up as well, and pt in agreement.    Follow Up Recommendations  Supervision - Intermittent;No PT follow up     Equipment Recommendations  None recommended by PT    Recommendations for Other Services       Precautions / Restrictions Precautions Precautions: Fall Precaution Comments: JP drain, urostomy    Mobility  Bed Mobility Overal bed mobility: Modified Independent                Transfers Overall transfer level: Modified independent                  Ambulation/Gait Ambulation/Gait assistance: Supervision;Modified independent (Device/Increase time) Ambulation Distance (Feet): 200 Feet Assistive device: None Gait Pattern/deviations: Step-through pattern;Decreased stride length     General Gait Details: slow but steady pace   Financial trader Rankin (Stroke Patients Only)       Balance                                            Cognition Arousal/Alertness: Awake/alert Behavior During Therapy: WFL for tasks assessed/performed Overall Cognitive Status: Within Functional Limits for tasks assessed                                        Exercises      General Comments        Pertinent Vitals/Pain Pain Assessment: 0-10 Pain Score: 7  Pain Location: abdomen Pain Descriptors /  Indicators: Sore;Operative site guarding;Discomfort Pain Intervention(s): Limited activity within patient's tolerance;Repositioned;Monitored during session;Patient requesting pain meds-RN notified    Home Living                      Prior Function            PT Goals (current goals can now be found in the care plan section) Progress towards PT goals: Goals met/education completed, patient discharged from PT    Frequency           PT Plan Other (comment) (d/c from acute PT)    Co-evaluation              AM-PAC PT "6 Clicks" Daily Activity  Outcome Measure  Difficulty turning over in bed (including adjusting bedclothes, sheets and blankets)?: None Difficulty moving from lying on back to sitting on the side of the bed? : None Difficulty sitting down on and standing up from a chair with arms (e.g., wheelchair, bedside commode, etc,.)?: None Help needed moving to and from a bed to chair (including a wheelchair)?: None Help needed walking in hospital room?: A Little Help needed  climbing 3-5 steps with a railing? : A Little 6 Click Score: 22    End of Session   Activity Tolerance: Patient tolerated treatment well Patient left: in bed;with call bell/phone within reach Nurse Communication: Mobility status PT Visit Diagnosis: Difficulty in walking, not elsewhere classified (R26.2)     Time: 8022-3361 PT Time Calculation (min) (ACUTE ONLY): 9 min  Charges:  $Gait Training: 8-22 mins                    G Codes:      Carmelia Bake, PT, DPT 08/04/2017 Pager: [phone]    York Ram E 08/04/2017, 12:45 PM

## 2017-08-05 MED ORDER — HEPARIN SOD (PORK) LOCK FLUSH 100 UNIT/ML IV SOLN
500.0000 [IU] | INTRAVENOUS | Status: AC | PRN
  Administered 2017-08-05: 500 [IU]

## 2017-08-05 MED ORDER — SENNOSIDES-DOCUSATE SODIUM 8.6-50 MG PO TABS: 1.0000 | ORAL_TABLET | Freq: Two times a day (BID) | ORAL | 0 refills | Status: DC

## 2017-08-05 MED ORDER — OXYCODONE HCL 10 MG PO TABS: 10.0000 mg | ORAL_TABLET | ORAL | 0 refills | Status: DC | PRN

## 2017-08-05 NOTE — Progress Notes (Signed)
[facility] could not take pt. [facility] was [facility], Mr. Yeatts was called and made aware. Mr. Bagdasarian states, [place].

## 2017-08-05 NOTE — Discharge Summary (Signed)
Physician Discharge Summary  Patient ID: Jeffery Ponce MRN: [id number] DOB/AGE: Sep 12, 1946 71 y.o.  Admit date: 07/29/2017 Discharge date: 08/05/2017  Admission Diagnoses: Bladder Cancer  Discharge Diagnoses:  Active Problems:   Bladder cancer ([place])   History of ileal conduit   Discharged Condition: good  Hospital Course:   1 - High Grade Bladder Cancer - s/p robotic cystoprostatectomy + ileal conduit urinary diversion + ICG sentinal / template pelvic lymphadenectomy 07/30/17 after admit day prior for bowel prep. Stepdown POD 0, transferred go med-surg floor 8/23. Path [id number] with negative margins. JP removed POD 6 as output scant.   2 - Ileus - s/p bowel anastomosis as part fo bladder cancer surgery 8/21. Received entereg pre/post-op. NPO with ice chips initially post-op. Advancing to clears 8/24. Flatus starting 8/25, advanced to fulls 8/26 with return of BM. Advanced to reg diet 8/27 which he is tolerating at discharge.   3 - Disposition / Rehab - pt independent at baseline. PT eval 8/23 finds no need for PT at home. He has new urostomy with ostomy RN following in house with home health arranged at discharge. He is very motivated and compliant with walking.  By POD 6, the day of discharge, he is ambulatory, pain controlled on PO meds, maintaining PO nutrition, and felt to be adequate for discharge with home health.   Consults: PT, ostomy RN, case managment.   Significant Diagnostic Studies: labs: as per above  Treatments: surgery: as per above  Discharge Exam:  General appearance: alert, cooperative and appears stated age Eyes: negative Nose: Nares normal. Septum midline. Mucosa normal. No drainage or sinus tenderness. Neck: supple, symmetrical, trachea midline Back: symmetric, no curvature. ROM normal. No CVA tenderness. Resp: non-labored on room air.  Cardio: mild regualr tachycardia.  GI: soft, non-tender; bowel sounds normal; no masses,  no organomegaly Male  genitalia: normal Extremities: extremities normal, atraumatic, no cyanosis or edema Pulses: 2+ and symmetric Skin: Skin color, texture, turgor normal. No rashes or lesions Lymph nodes: Cervical, supraclavicular, and axillary nodes normal. Neurologic: Grossly normal Incision/Wound: recent port and extraction sites c/d/i. No abd distansion. RLQ Urostomy pink / patent of yellow urine with some mucus, distal bander stents in place.   Blood pressure 113/62, pulse 82, temperature 98 F (36.7 C), temperature source Oral, resp. rate 16, height 5\' 10"  (1.778 m), weight 77 kg (169 lb 12.1 oz), SpO2 94 %.   Disposition: 01-Home or Self Care   Allergies as of 08/05/2017      Reactions   Penicillins Anaphylaxis   Has patient had a PCN reaction causing immediate rash, facial/tongue/throat swelling, SOB or lightheadedness with hypotension:Yes Has patient had a PCN reaction causing severe rash involving mucus membranes or skin necrosis:Yes Has patient had a PCN reaction that required hospitalization:No Has patient had a PCN reaction occurring within the last 10 years:No If all of the above answers are "NO", then may proceed with Cephalosporin use.   Ciprofloxacin Itching   Itching at IV site. No hives or shortness of breathe.   Ambien [zolpidem Tartrate] Other (See Comments)   "Sleep walking"      Medication List    STOP taking these medications   docusate sodium 100 MG capsule Commonly known as:  COLACE   prochlorperazine 10 MG tablet Commonly known as:  COMPAZINE     TAKE these medications   diazepam 10 MG tablet Commonly known as:  VALIUM Take 10 mg by mouth every 8 (eight) hours as needed.   ondansetron 8  MG tablet Commonly known as:  ZOFRAN Take 1 tablet (8 mg total) by mouth every 8 (eight) hours as needed for nausea or vomiting.   Oxycodone HCl 10 MG Tabs Take 1-2 tablets (10-20 mg total) by mouth every 4 (four) hours as needed (pain). Post-operatively What changed:  how  much to take  additional instructions   senna-docusate 8.6-50 MG tablet Commonly known as:  Senokot-S Take 1 tablet by mouth 2 (two) times daily. While taking strong pain meds to prevent constipation.   traZODone 100 MG tablet Commonly known as:  DESYREL Take 100 mg by mouth at bedtime. What changed:  Another medication with the same name was removed. Continue taking this medication, and follow the directions you see here.            Discharge Care Instructions        Start     Ordered   08/05/17 0000  Oxycodone HCl 10 MG TABS  Every 4 hours PRN     08/05/17 0659   08/05/17 0000  senna-docusate (SENOKOT-S) 8.6-50 MG tablet  2 times daily     08/05/17 9381     Follow-up Information    Alexis Frock, MD Follow up on 08/19/2017.   Specialty:  Urology Why:  at 1:45 for MD visit.  Contact information: [place] [address] [phone]           Signed: Alexis Frock 08/05/2017, 6:59 AM

## 2017-08-05 NOTE — Progress Notes (Addendum)
Discharge instructions and prescriptions given to patient and wife via teach back method.  Patient and wife verbalized understanding.  Calcium set up confirmed with Cookie, CM.  Awaiting PAC deaccess by IV RN at this time.

## 2017-09-09 ENCOUNTER — Encounter (HOSPITAL_COMMUNITY): Payer: Medicare HMO | Attending: Adult Health | Admitting: Adult Health

## 2017-09-09 ENCOUNTER — Encounter (HOSPITAL_COMMUNITY): Payer: Medicare HMO

## 2017-09-09 ENCOUNTER — Encounter (HOSPITAL_COMMUNITY): Payer: Self-pay | Admitting: Adult Health

## 2017-09-09 ENCOUNTER — Telehealth (HOSPITAL_COMMUNITY): Payer: Self-pay | Admitting: Adult Health

## 2017-09-09 VITALS — BP 134/68 | HR 81 | Resp 16 | Ht 70.0 in | Wt 158.0 lb

## 2017-09-09 DIAGNOSIS — Z23 Encounter for immunization: Secondary | ICD-10-CM

## 2017-09-09 DIAGNOSIS — C679 Malignant neoplasm of bladder, unspecified: Secondary | ICD-10-CM

## 2017-09-09 DIAGNOSIS — Z720 Tobacco use: Secondary | ICD-10-CM | POA: Diagnosis not present

## 2017-09-09 DIAGNOSIS — G893 Neoplasm related pain (acute) (chronic): Secondary | ICD-10-CM | POA: Diagnosis not present

## 2017-09-09 LAB — CBC WITH DIFFERENTIAL/PLATELET
BASOS PCT: 0 %
Basophils Absolute: 0 10*3/uL (ref 0.0–0.1)
Eosinophils Absolute: 0.7 10*3/uL (ref 0.0–0.7)
Eosinophils Relative: 7 %
HEMATOCRIT: 33.6 % — AB (ref 39.0–52.0)
HEMOGLOBIN: 10.9 g/dL — AB (ref 13.0–17.0)
LYMPHS ABS: 2.1 10*3/uL (ref 0.7–4.0)
Lymphocytes Relative: 24 %
MCH: 30.3 pg (ref 26.0–34.0)
MCHC: 32.4 g/dL (ref 30.0–36.0)
MCV: 93.3 fL (ref 78.0–100.0)
MONOS PCT: 7 %
Monocytes Absolute: 0.6 10*3/uL (ref 0.1–1.0)
NEUTROS ABS: 5.4 10*3/uL (ref 1.7–7.7)
NEUTROS PCT: 62 %
Platelets: 226 10*3/uL (ref 150–400)
RBC: 3.6 MIL/uL — AB (ref 4.22–5.81)
RDW: 13.9 % (ref 11.5–15.5)
WBC: 8.7 10*3/uL (ref 4.0–10.5)

## 2017-09-09 LAB — COMPREHENSIVE METABOLIC PANEL
ALBUMIN: 4 g/dL (ref 3.5–5.0)
ALK PHOS: 85 U/L (ref 38–126)
ALT: 11 U/L — AB (ref 17–63)
ANION GAP: 9 (ref 5–15)
AST: 16 U/L (ref 15–41)
BILIRUBIN TOTAL: 0.4 mg/dL (ref 0.3–1.2)
BUN: 17 mg/dL (ref 6–20)
CALCIUM: 8.9 mg/dL (ref 8.9–10.3)
CO2: 23 mmol/L (ref 22–32)
CREATININE: 1.49 mg/dL — AB (ref 0.61–1.24)
Chloride: 104 mmol/L (ref 101–111)
GFR calc Af Amer: 53 mL/min — ABNORMAL LOW (ref >60)
GFR calc non Af Amer: 45 mL/min — ABNORMAL LOW (ref >60)
GLUCOSE: 102 mg/dL — AB (ref 65–99)
Potassium: 4.4 mmol/L (ref 3.5–5.1)
SODIUM: 136 mmol/L (ref 135–145)
TOTAL PROTEIN: 7.2 g/dL (ref 6.5–8.1)

## 2017-09-09 MED ORDER — INFLUENZA VAC SPLIT QUAD 0.5 ML IM SUSY
0.5000 mL | PREFILLED_SYRINGE | Freq: Once | INTRAMUSCULAR | Status: AC
  Administered 2017-09-09: 0.5 mL via INTRAMUSCULAR

## 2017-09-09 NOTE — Telephone Encounter (Signed)
Placed call to Dr. Pamala Hurry office with [facility] in [place].  Per patient, he has been caring for patient's chronic pain.  I left a message with Dr. Pamala Hurry staff re: patient's worsening pain s/p recent surgery for bladder cancer.  Requesting that patient be able to take pain medication 6 times per day, instead of 5.    Awaiting return call.   Mike Craze, NP [place] [phone]

## 2017-09-09 NOTE — Progress Notes (Signed)
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 Correlate with tissue sampling on cystoscopy.  No findings suspicious for upper tract disease.  Cholelithiasis, without associated inflammatory changes.      11/19/2016 Procedure    TURBT by [name] of a 2 centimeter anterior bladder wall tumor, placement of epirubicin intravesical      11/19/2016 Procedure    50 milligrams of epirubicin and 50 mL of diluent.  This was left indwelling for 1 hour by [name]      11/22/2016 Pathology Results    Bladder, transurethral resection, bladder tumor INFILTRATING HIGH GRADE UROTHELIAL CARCINOMA THE CARCINOMA INVADES MUSCULARIS PROPRIA (DETRUSOR MUSCLE) Microscopic Comment The neoplasm stains positive for high molecular weight cytokeratin, p63 and negative for prostein. The immunostain pattern supports the diagnosis of urothelial carcinoma.      12/18/2016 Imaging    Bilateral interstitial prominence. Active pneumonitis cannot be excluded. These changes could be also be related chronic interstitial lung disease.      12/30/2016 - 04/28/2017 Chemotherapy    The patient had palonosetron (ALOXI) injection 0.25 mg, 0.25 mg, Intravenous,  Once, 4 of 4 cycles  CISplatin (PLATINOL) 138 mg in sodium chloride 0.9 % 500 mL chemo infusion, 70 mg/m2 = 138 mg, Intravenous,  Once, 4 of 4 cycles  gemcitabine (GEMZAR) 1,976  mg in sodium chloride 0.9 % 250 mL chemo infusion, 1,000 mg/m2 = 1,976 mg, Intravenous,  Once, 4 of 4 cycles Dose modification: 800 mg/m2 (80 % of original dose 1,000 mg/m2, Cycle 3, Reason: Provider Judgment, Comment: Thrombocytopenia)  fosaprepitant (EMEND) 150 mg, dexamethasone (DECADRON) 12 mg in sodium chloride 0.9 % 145 mL IVPB, , Intravenous,  Once, 4 of 4 cycles  for chemotherapy treatment.        02/17/2017 Treatment Plan Change    Treatment deferred x 7 days due to thrombocytopenia      02/17/2017 Treatment Plan Change    Gemcitabine dose reduced by 20% due to thrombocytopenia      03/10/2017 Treatment Plan Change    Cisplatin dose-reduced by 20%      04/28/2017 Treatment Plan Change    Day 15 of cycle #4 is cancelled due to thrombocytopenia (68,000).      05/02/2017 Imaging    CT abd/pelvis at [facility]-no evidence of metastatic disease or recurrent focal urothelial lesion on noncontrast imaging.  There is mild diffuse bladder wall thickening.  Urothelial evaluation limited without contrast.  Cholelithiasis, sigmoid diverticulosis, aortic atherosclerosis.         INTERVAL HISTORY:  Mr. Dunn 71 y.o. male returns for routine follow-up for bladder cancer.   Overall, he tells me he has been doing "fair." He feels tired and weak since his surgery; energy levels 50%. But he feels like he is slowly improving.    Pain is his biggest concern today; pain is in his abd and he describes it as tender since surgery. Recently underwent cystoprostatectomy on 07/30/17 with [name] (  Urology).  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  With regards to his pain, he tells me that he generally has the worst pain from ~2 AM until 6 AM "because I'm not allowed to  take more than 5 oxycodone pills per day."  He has a history of chronic pain, which is managed by Dr. Foy Guadalajara with [facility] in [place].  He is asking if we can give him an additional #30 tabs of oxycodone so that he will have enough pain medication to take 1 tab 6 times per day (instead of 5, which is currently prescribed).  States that he has been taking Tylenol during the early morning hours when his pain "is really bad, but it doesn't help much at all."  He had one of his oxycodone bottles with him today and he allowed me to count them; #56 oxycodone 10 mg pills noted.  Stated that he had ~30 additional oxycodone pills in his daily pill boxes at home.   He is concerned about his weight; he thinks he is losing weight and reports having low appetite at 50%. He is supplementing his diet with 2-3 Ensures per day. Chart reviewed with patient and it appears that his weight has remained stable within 3-4 lbs over the past several visits. Of course, we will keep monitoring. Encouraged him to increase his Ensures to 4-5 cans per day if he is not eating much.    He continues to smoke cigarettes; generally smokes ~3 cigarettes per day.  He is really trying to quit.  He tells me that he has both nicotine patches and nicotine gum at home, but has not really started using either of these products yet.      REVIEW OF SYSTEMS:  Review of Systems  Constitutional: Positive for fatigue.  HENT:  Negative.   Eyes: Negative.   Respiratory: Negative.  Negative for cough and shortness of breath.   Cardiovascular: Negative.  Negative for chest pain and leg swelling.  Gastrointestinal: Negative.  Negative for constipation, diarrhea, nausea and vomiting.  Endocrine: Negative.   Musculoskeletal: Positive for arthralgias and back pain.       Chronic arthralgias to hips, back, and arms   Skin: Negative.  Negative for rash.  Neurological: Positive for extremity weakness.  Hematological: Negative.   Psychiatric/Behavioral:  Negative.      PAST MEDICAL/SURGICAL HISTORY:  Past Medical History:  Diagnosis Date  . Arthritis   . History of kidney stones   . Hypercholesteremia   . Urothelial carcinoma ([place]) 12/10/2016  . Urothelial carcinoma of bladder ([place]) 12/10/2016   Past Surgical History:  Procedure Laterality Date  . CARDIAC CATHETERIZATION    . CYSTOSCOPY WITH INJECTION N/A 07/30/2017   Procedure: CYSTOSCOPY WITH INJECTION OF INDOCYANINE GREEN DYE;  Surgeon: Alexis Frock, MD;  Location: [facility];  Service: Urology;  Laterality: N/A;  . open reduction and internal fixation leg Right    hip and leg.  Marland Kitchen PORTACATH PLACEMENT N/A 12/23/2016   Procedure: PLACEMENT OF TUNNELED CENTRAL VENOUS CATHETER RIGHT INTERNAL JUGULAR WITH SUBCUTANEOUS PORT;  Surgeon: Vickie Epley, MD;  Location: [facility];  Service: Vascular;  Laterality: N/A;  . reattatchment of left arm     from [place]  . TRANSURETHRAL RESECTION OF BLADDER TUMOR N/A 11/19/2016   Procedure: TRANSURETHRAL RESECTION OF BLADDER TUMOR (TURBT) WITH  EPIRUBICIN INJECTION;  Surgeon: Franchot Gallo, MD;  Location: [facility];  Service: Urology;  Laterality: N/A;     SOCIAL HISTORY:  Social History   Social History  . Marital status: Married    Spouse name: N/A  . Number of children: N/A  . Years of education: N/A   Occupational History  . Not on file.   Social History Main Topics  . Smoking status: Current Every Day Smoker    Packs/day: 2.00    Years: 50.00    Types: Cigarettes  . Smokeless tobacco: Never Used  . Alcohol use No  . Drug use: No  . Sexual activity: No     Comment: married-30 years-2 children by first wife   Other Topics Concern  . Not on file   Social History Narrative  . No narrative on file    FAMILY HISTORY:  Family History  Problem Relation Age of Onset  . Aneurysm Mother   . Diabetes Father   . Colon cancer Brother     CURRENT MEDICATIONS:  Outpatient Encounter Prescriptions as of 09/09/2017  Medication Sig    . diazepam (VALIUM) 10 MG tablet Take 10 mg by mouth every 8 (eight) hours as needed.   . ondansetron (ZOFRAN) 8 MG tablet Take 1 tablet (8 mg total) by mouth every 8 (eight) hours as needed for nausea or vomiting.  . Oxycodone HCl 10 MG TABS Take 1-2 tablets (10-20 mg total) by mouth every 4 (four) hours as needed (pain). Post-operatively  . senna-docusate (SENOKOT-S) 8.6-50 MG tablet Take 1 tablet by mouth 2 (two) times daily. While taking strong pain meds to prevent constipation.  . traZODone (DESYREL) 100 MG tablet Take 100 mg by mouth at bedtime.  . OxyCODONE ER (XTAMPZA ER) 13.5 MG C12A Xtampza ER 13.5 mg capsule sprinkle  1 BID Fill 08/28/2017  . [EXPIRED] Influenza vac split quadrivalent PF (FLUARIX) injection 0.5 mL    No facility-administered encounter medications on file as of 09/09/2017.     ALLERGIES:  Allergies  Allergen Reactions  . Penicillins Anaphylaxis    Has patient had a PCN reaction causing immediate rash, facial/tongue/throat swelling, SOB or lightheadedness with hypotension:Yes Has patient had a PCN reaction causing severe rash involving mucus membranes or skin necrosis:Yes Has patient had a PCN reaction that required hospitalization:No Has patient had a PCN reaction occurring within the last 10 years:No If all of the above answers are "NO", then may proceed with Cephalosporin use.   . Ciprofloxacin Itching    Itching at IV site. No hives or shortness of breathe.  . Ambien [Zolpidem Tartrate] Other (See Comments)    "Sleep walking"     PHYSICAL EXAM:  ECOG Performance status: 1 - Symptomatic; remains largely independent   Vitals:   09/09/17 1300  BP: 134/68  Pulse: 81  Resp: 16  SpO2: 98%   Filed Weights   09/09/17 1300  Weight: 158 lb (71.7 kg)    Physical Exam  Constitutional: He is oriented to person, place, and time.  HENT:  Head: Normocephalic.  Mouth/Throat: Oropharynx is clear and moist. No oropharyngeal exudate.  Eyes: Pupils are  equal, round, and reactive to light. Conjunctivae are normal. No scleral icterus.  Neck: Normal range of motion. Neck supple.  Cardiovascular: Normal rate and regular rhythm.   Pulmonary/Chest: Effort normal. No respiratory distress.  Diminished breath sounds bilat bases  Abdominal: Soft. There is tenderness.  (R) urostomy noted. Output yellow and clear. Mild mucus noted at stoma; no bleeding  noted.  -Healing abdominal incisions s/p surgery; no evidence of infection.   Musculoskeletal: Normal range of motion. He exhibits no edema.  Lymphadenopathy:    He has no cervical adenopathy.  Neurological: He is alert and oriented to person, place, and time. No cranial nerve deficit.  Skin: Skin is warm and dry. No rash noted.  Psychiatric: Mood, memory, affect and judgment normal.  Nursing note and vitals reviewed.    LABORATORY DATA:  I have reviewed the labs as listed.  CBC    Component Value Date/Time   WBC 8.7 09/09/2017 1159   RBC 3.60 (L) 09/09/2017 1159   HGB 10.9 (L) 09/09/2017 1159   HCT 33.6 (L) 09/09/2017 1159   PLT 226 09/09/2017 1159   MCV 93.3 09/09/2017 1159   MCH 30.3 09/09/2017 1159   MCHC 32.4 09/09/2017 1159   RDW 13.9 09/09/2017 1159   LYMPHSABS 2.1 09/09/2017 1159   MONOABS 0.6 09/09/2017 1159   EOSABS 0.7 09/09/2017 1159   BASOSABS 0.0 09/09/2017 1159   CMP Latest Ref Rng & Units 09/09/2017 08/04/2017 08/03/2017  Glucose 65 - 99 mg/dL 102(H) 104(H) 121(H)  BUN 6 - 20 mg/dL 17 23(H) 16  Creatinine 0.61 - 1.24 mg/dL 1.49(H) 1.56(H) 1.37(H)  Sodium 135 - 145 mmol/L 136 137 138  Potassium 3.5 - 5.1 mmol/L 4.4 3.7 3.5  Chloride 101 - 111 mmol/L 104 109 106  CO2 22 - 32 mmol/L 23 24 26   Calcium 8.9 - 10.3 mg/dL 8.9 8.2(L) 8.5(L)  Total Protein 6.5 - 8.1 g/dL 7.2 - -  Total Bilirubin 0.3 - 1.2 mg/dL 0.4 - -  Alkaline Phos 38 - 126 U/L 85 - -  AST 15 - 41 U/L 16 - -  ALT 17 - 63 U/L 11(L) - -    PENDING LABS:    DIAGNOSTIC IMAGING:  *The following radiologic  images and reports have been reviewed independently and agree with below findings.  Last CT abd/pelvis: 11/15/16 CLINICAL DATA:  Gross hematuria x 4 weeks, mass on cystoscopy  EXAM: CT ABDOMEN AND PELVIS WITHOUT AND WITH CONTRAST  TECHNIQUE: Multidetector CT imaging of the abdomen and pelvis was performed following the standard protocol before and following the bolus administration of intravenous contrast.  CONTRAST:  162mL ISOVUE-300 IOPAMIDOL (ISOVUE-300) INJECTION 61%  COMPARISON:  None.  FINDINGS: Lower chest: Mild dependent atelectasis at the lung bases.  Hepatobiliary: Liver is notable for a 4 mm cyst in segment 5 (series 3/ image 21).  Layering 15 mm gallstone (series 3/image 27), without associated inflammatory changes. No intrahepatic or extrahepatic ductal dilatation.  Pancreas: Within normal limits.  Spleen: Within normal limits.  Adrenals/Urinary Tract: Renal glands are within normal limits.  Kidneys are within normal limits.  No enhancing renal lesions.  No renal, ureteral, or bladder calculi.  No hydronephrosis.  Enhancing soft tissue lesion along the left bladder base (series 4/ image 87), measuring approximately 3.2 x 2.7 cm (series 3/ image 89). This appearance is worrisome for primary bladder neoplasm. Exophytic growth of the prostate (BPH versus tumor) is considered less likely.  On delayed imaging, there are no filling defects in the opacified portions of the bilateral renal collecting systems or ureters.  Stomach/Bowel: Stomach is within normal limits.  No evidence of bowel obstruction.  Appendix is not discretely visualized.  Mild sigmoid diverticulosis, without evidence of diverticulitis.  Vascular/Lymphatic: No evidence of abdominal aortic aneurysm.  Atherosclerotic calcifications of the abdominal aorta and branch vessels.  No suspicious abdominopelvic lymphadenopathy.  Reproductive: Prostate  is notable for mild  central gland nodularity.  Other: No abdominopelvic ascites.  Musculoskeletal: Status post ORIF of the right proximal femur.  Degenerative changes of the lumbar spine, most prominent at L2-3.  No focal osseous lesions.  IMPRESSION: Enhancing soft tissue lesion along the left bladder base, measuring approximately 3.2 x 2.7 cm, worrisome for primary bladder neoplasm. Correlate with tissue sampling on cystoscopy.  No findings suspicious for upper tract disease.  Cholelithiasis, without associated inflammatory changes.  Additional ancillary findings as above.   Electronically Signed   By: Julian Hy M.D.   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 Treated with TURBT and intravesicular chemo, followed by additional TURBT.  Went on to receive 4 cycles of Cisplatin/Gemzar; systemic chemo course was complicated by cytopenias requiring several dose reductions.  Completed chemo on 04/28/17. Post-chemo CT revealed no evidence of metastatic disease or recurrent focal urothelial lesion. Underwent surgical resection with cystoprostatectomy with [name] on 07/29/17.  -Continuing to recover from recent surgery. Maintain follow-up with urology as directed.  -Restaging CT chest/abd/pelvis due in 3 months; orders placed today.  -Return to cancer center in 3 months for follow-up a few days after scans.   Pain:  -Likely multifactorial given malignancy, recent surgery, and history of chronic arthralgia pain.  -[place] Controlled Substance Reporting System reviewed. Patient received #150 Oxycodone tabs from Dr. Foy Guadalajara on 08/28/17. He had #56 pills in his pill bottle today. Also noted to have filled Rx for [facility] ER 13.5 mg on 08/28/17, #60 as well.  -Shared with Mr. Sena and I am happy to provide him with additional pain medications  to help control his cancer/surgery-related pain in the short-term.  However, I do not want to negatively impact any arrangements he may have with his chronic pain management team.  I placed a call to Dr. Pamala Hurry office in [place] [phone]) and am awaiting return call.      Bowel regimen:  -Reinforced the importance of adequate bowel regimen with opiates. Currently reports having daily BM without difficulty.   Tobacco use disorder:  -Currently smoking ~3 cigarettes per day. Reviewed with him the importance of complete tobacco cessation, particularly in the setting of malignancy and wound healing post-operatively.  Given that he is currently smoking a minimal amount, I think he would benefit from short-acting nicotine replacement products (like gum or lozenge). Gave him instructions on how to use. He states that he already has these products at home and is willing to try them. Provided support and encouragement for his willingness to continue working on smoking cessation. Will continue to provide support as needed at future visits.       Dispo:  -Restaging CT chest/abd/pelvis in 3 months.  -Return to cancer center in 3 months for follow-up a few days after scans.    All questions were answered to patient's stated satisfaction. Encouraged patient to call with any new concerns or questions before his next visit to the cancer center and we can certain see him sooner, if needed.    Plan of care discussed with [name], who agrees with the above aforementioned.    Orders placed this encounter:  Orders Placed This Encounter  Procedures  . CT Chest W Contrast  . CT Abdomen Pelvis W Contrast      Mike Craze,  NP [place] [phone]

## 2017-09-09 NOTE — Progress Notes (Signed)
Pt given flu shot in right deltoid. Pt tolerated well. Pt stable and discharged home ambulatory.  

## 2017-09-10 ENCOUNTER — Telehealth (HOSPITAL_COMMUNITY): Payer: Self-pay | Admitting: Adult Health

## 2017-09-10 NOTE — Telephone Encounter (Signed)
Received return call from Dr. Pamala Hurry office at [facility].  They are happy to provide patient with additional supply of pain medication so that he can take 6 tabs per day of his breakthrough pain medication.  They will notify patient of these changes and when he can pick up a new prescription from their office.  I thanked them for providing [place] this update and for calling the patient to let him know.   We will continue not to prescribe pain medications for this patient, as pain is being managed by [facility].   Jeffery Craze, NP [place] [phone]

## 2017-09-11 ENCOUNTER — Telehealth (HOSPITAL_COMMUNITY): Payer: Self-pay

## 2017-09-11 NOTE — Telephone Encounter (Signed)
Patient called stating he got the flu shot on Tuesday at his follow up appt and has been running a fever since. It is worse at night. He states he is taking tylenol and he hasn't had a fever today. Reviewed with RN. Explained to patient that you can get a fever when you get immunizations. Explained that his fever should be getting better today since it is 2 days since he had the flu shot. Patient verbalized understanding.

## 2017-09-15 ENCOUNTER — Other Ambulatory Visit (HOSPITAL_COMMUNITY): Payer: Self-pay | Admitting: Oncology

## 2017-10-02 ENCOUNTER — Other Ambulatory Visit (HOSPITAL_COMMUNITY): Payer: Self-pay | Admitting: Oncology

## 2017-10-27 ENCOUNTER — Telehealth (HOSPITAL_COMMUNITY): Payer: Self-pay

## 2017-10-27 NOTE — Telephone Encounter (Signed)
Patient called today concerned about his appetite. Patient states he is drinking 2-3 ensures a day with his meals. Patient denies any major change in weight. Asked to have his tests moved up , says he is planning to be out of town. MD made aware. Offered patient a nutrition consult, he denied that. I told patient about his new appointment times for his tests and doctor appointment. Pt understood. Encouraged patient to continue drinking Ensures and to let us know if things get worse.

## 2017-10-27 NOTE — Telephone Encounter (Signed)
Patient's wife came up to cancer center today asking about when he does restart his imbruvica, at what dose does he restart at. MD made aware and she stated that he can restart at the same dose that he has been on. No change.

## 2017-11-24 ENCOUNTER — Encounter (HOSPITAL_COMMUNITY): Payer: Medicare HMO | Attending: Oncology

## 2017-11-24 DIAGNOSIS — C679 Malignant neoplasm of bladder, unspecified: Secondary | ICD-10-CM | POA: Diagnosis present

## 2017-11-24 LAB — COMPREHENSIVE METABOLIC PANEL
ALBUMIN: 3.9 g/dL (ref 3.5–5.0)
ALK PHOS: 113 U/L (ref 38–126)
ALT: 11 U/L — AB (ref 17–63)
AST: 14 U/L — ABNORMAL LOW (ref 15–41)
Anion gap: 6 (ref 5–15)
BILIRUBIN TOTAL: 0.4 mg/dL (ref 0.3–1.2)
BUN: 21 mg/dL — ABNORMAL HIGH (ref 6–20)
CALCIUM: 9.6 mg/dL (ref 8.9–10.3)
CO2: 21 mmol/L — AB (ref 22–32)
CREATININE: 1.56 mg/dL — AB (ref 0.61–1.24)
Chloride: 110 mmol/L (ref 101–111)
GFR calc non Af Amer: 43 mL/min — ABNORMAL LOW (ref >60)
GFR, EST AFRICAN AMERICAN: 49 mL/min — AB (ref >60)
GLUCOSE: 110 mg/dL — AB (ref 65–99)
Potassium: 5.5 mmol/L — ABNORMAL HIGH (ref 3.5–5.1)
SODIUM: 137 mmol/L (ref 135–145)
TOTAL PROTEIN: 7.6 g/dL (ref 6.5–8.1)

## 2017-11-24 LAB — CBC WITH DIFFERENTIAL/PLATELET
Basophils Absolute: 0 10*3/uL (ref 0.0–0.1)
Basophils Relative: 0 %
EOS ABS: 0.4 10*3/uL (ref 0.0–0.7)
Eosinophils Relative: 3 %
HEMATOCRIT: 39.3 % (ref 39.0–52.0)
HEMOGLOBIN: 12 g/dL — AB (ref 13.0–17.0)
LYMPHS ABS: 2.7 10*3/uL (ref 0.7–4.0)
Lymphocytes Relative: 26 %
MCH: 28.7 pg (ref 26.0–34.0)
MCHC: 30.5 g/dL (ref 30.0–36.0)
MCV: 94 fL (ref 78.0–100.0)
MONOS PCT: 8 %
Monocytes Absolute: 0.9 10*3/uL (ref 0.1–1.0)
NEUTROS ABS: 6.5 10*3/uL (ref 1.7–7.7)
NEUTROS PCT: 63 %
Platelets: 300 10*3/uL (ref 150–400)
RBC: 4.18 MIL/uL — AB (ref 4.22–5.81)
RDW: 15.5 % (ref 11.5–15.5)
WBC: 10.4 10*3/uL (ref 4.0–10.5)

## 2017-11-24 LAB — MAGNESIUM: Magnesium: 2 mg/dL (ref 1.7–2.4)

## 2017-11-26 ENCOUNTER — Ambulatory Visit (HOSPITAL_COMMUNITY)
Admission: RE | Admit: 2017-11-26 | Discharge: 2017-11-26 | Disposition: A | Discharge status: Home / Self Care | Payer: Medicare HMO | Source: Ambulatory Visit | Attending: Adult Health | Admitting: Adult Health

## 2017-11-26 DIAGNOSIS — C679 Malignant neoplasm of bladder, unspecified: Secondary | ICD-10-CM

## 2017-11-26 DIAGNOSIS — K8020 Calculus of gallbladder without cholecystitis without obstruction: Secondary | ICD-10-CM | POA: Insufficient documentation

## 2017-11-26 DIAGNOSIS — J439 Emphysema, unspecified: Secondary | ICD-10-CM | POA: Insufficient documentation

## 2017-11-26 DIAGNOSIS — K5730 Diverticulosis of large intestine without perforation or abscess without bleeding: Secondary | ICD-10-CM | POA: Insufficient documentation

## 2017-11-26 DIAGNOSIS — K229 Disease of esophagus, unspecified: Secondary | ICD-10-CM | POA: Diagnosis not present

## 2017-11-26 DIAGNOSIS — I700 Atherosclerosis of aorta: Secondary | ICD-10-CM | POA: Insufficient documentation

## 2017-11-26 DIAGNOSIS — I2510 Atherosclerotic heart disease of native coronary artery without angina pectoris: Secondary | ICD-10-CM | POA: Insufficient documentation

## 2017-11-26 DIAGNOSIS — R590 Localized enlarged lymph nodes: Secondary | ICD-10-CM | POA: Insufficient documentation

## 2017-11-26 DIAGNOSIS — R911 Solitary pulmonary nodule: Secondary | ICD-10-CM | POA: Diagnosis not present

## 2017-11-26 MED ORDER — IOPAMIDOL (ISOVUE-300) INJECTION 61%
100.0000 mL | Freq: Once | INTRAVENOUS | Status: AC | PRN
  Administered 2017-11-26: 80 mL via INTRAVENOUS

## 2017-11-28 ENCOUNTER — Encounter (HOSPITAL_BASED_OUTPATIENT_CLINIC_OR_DEPARTMENT_OTHER): Payer: Medicare HMO | Admitting: Oncology

## 2017-11-28 ENCOUNTER — Encounter (HOSPITAL_COMMUNITY): Payer: Self-pay | Admitting: Lab

## 2017-11-28 ENCOUNTER — Encounter (HOSPITAL_COMMUNITY): Payer: Self-pay | Admitting: Oncology

## 2017-11-28 ENCOUNTER — Other Ambulatory Visit: Payer: Self-pay

## 2017-11-28 VITALS — BP 129/63 | HR 104 | Temp 98.4°F | Resp 18 | Wt 159.5 lb

## 2017-11-28 DIAGNOSIS — E875 Hyperkalemia: Secondary | ICD-10-CM | POA: Diagnosis not present

## 2017-11-28 DIAGNOSIS — N183 Chronic kidney disease, stage 3 unspecified: Secondary | ICD-10-CM | POA: Insufficient documentation

## 2017-11-28 DIAGNOSIS — G4709 Other insomnia: Secondary | ICD-10-CM

## 2017-11-28 DIAGNOSIS — Z720 Tobacco use: Secondary | ICD-10-CM

## 2017-11-28 DIAGNOSIS — C679 Malignant neoplasm of bladder, unspecified: Secondary | ICD-10-CM

## 2017-11-28 HISTORY — DX: Chronic kidney disease, stage 3 unspecified: N18.30

## 2017-11-28 MED ORDER — SODIUM POLYSTYRENE SULFONATE PO POWD: Freq: Once | ORAL | 0 refills | Status: AC

## 2017-11-28 MED ORDER — TRAZODONE HCL 100 MG PO TABS: 100.0000 mg | ORAL_TABLET | Freq: Every day | ORAL | 0 refills | Status: DC

## 2017-11-28 NOTE — Patient Instructions (Signed)
We will repeat CT scans in about 2 months. Your potassium is a little elevated so we will send a prescription for kayexalate that you can take one time to lower your potassium. Return in 2 months for follow-up.

## 2017-11-28 NOTE — Progress Notes (Unsigned)
Referral sent to [facility].  Records faxed on 12/21

## 2017-11-28 NOTE — Progress Notes (Signed)
[facility] [place] [id number]  Urothelial carcinoma of bladder [facility]) - Plan: CBC with Differential, Comprehensive metabolic panel, CT Chest W Contrast, CT Abdomen Pelvis W Contrast  Hyperkalemia - Plan: Potassium, Comprehensive metabolic panel, sodium polystyrene (KAYEXALATE) powder  Tobacco abuse  Other insomnia  Chronic renal disease, stage 3, moderately decreased glomerular filtration rate (GFR) between 30-59 mL/min/1.73 square meter (HCC)  CURRENT THERAPY: Close surveillance   INTERVAL HISTORY: Jeffery HOUSMAN 71 y.o. male returns for followup of high-grade urothelial carcinoma bladder is status post neoadjuvant systemic chemotherapy followed by definitive surgical management with cystoprostatectomy by [name] on 07/30/2017. He has high-risk disease and is at high risk for recurrence/relapse. He reports that his energy level is improving but is not back to baseline. He notes his appetite is back to baseline. His weight is relatively stable. He denies any nausea or vomiting. He denies any new lumps or bumps on his own examination. He denies any pain. He recently lost his primary care provider and is working on getting reestablished with a new primary care provider. In the interim, he asked if we will refill his trazodone and I've given him a one-month supply with no refills. He uses the trazodone to help him sleep.  HPI Elements   Location: Bladder   Quality: High-grade urothelial carcinoma   Severity: Stage IIIa   Duration: Diagnosed in December 2017   Context: Status post neoadjuvant systemic chemotherapy   Timing: Definitive management with cystoprostatectomy on 07/30/2017   Modifying Factors: Tobacco abuse, cigarette   Associated Signs & Symptoms:       Urothelial carcinoma of bladder (HCC)   11/15/2016 Imaging    CT abd/pelvis- Enhancing soft tissue lesion along the left bladder base, measuring approximately 3.2 x 2.7  cm, worrisome for primary bladder neoplasm. Correlate with tissue sampling on cystoscopy.  No findings suspicious for upper tract disease.  Cholelithiasis, without associated inflammatory changes.      11/19/2016 Procedure    TURBT by [name] of a 2 centimeter anterior bladder wall tumor, placement of epirubicin intravesical      11/19/2016 Procedure    50 milligrams of epirubicin and 50 mL of diluent.  This was left indwelling for 1 hour by [name]      11/22/2016 Pathology Results    Bladder, transurethral resection, bladder tumor INFILTRATING HIGH GRADE UROTHELIAL CARCINOMA THE CARCINOMA INVADES MUSCULARIS PROPRIA (DETRUSOR MUSCLE) Microscopic Comment The neoplasm stains positive for high molecular weight cytokeratin, p63 and negative for prostein. The immunostain pattern supports the diagnosis of urothelial carcinoma.      12/18/2016 Imaging    Bilateral interstitial prominence. Active pneumonitis cannot be excluded. These changes could be also be related chronic interstitial lung disease.      12/30/2016 - 04/28/2017 Chemotherapy    The patient had palonosetron (ALOXI) injection 0.25 mg, 0.25 mg, Intravenous,  Once, 4 of 4 cycles  CISplatin (PLATINOL) 138 mg in sodium chloride 0.9 % 500 mL chemo infusion, 70 mg/m2 = 138 mg, Intravenous,  Once, 4 of 4 cycles  gemcitabine (GEMZAR) 1,976 mg in sodium chloride 0.9 % 250 mL chemo infusion, 1,000 mg/m2 = 1,976 mg, Intravenous,  Once, 4 of 4 cycles Dose modification: 800 mg/m2 (80 % of original dose 1,000 mg/m2, Cycle 3, Reason: Provider Judgment, Comment: Thrombocytopenia)  fosaprepitant (EMEND) 150 mg, dexamethasone (DECADRON) 12 mg in sodium chloride 0.9 % 145 mL IVPB, , Intravenous,  Once, 4  of 4 cycles  for chemotherapy treatment.        02/17/2017 Treatment Plan Change    Treatment deferred x 7 days due to thrombocytopenia      02/17/2017 Treatment Plan Change    Gemcitabine dose reduced by 20% due to  thrombocytopenia      03/10/2017 Treatment Plan Change    Cisplatin dose-reduced by 20%      04/28/2017 Treatment Plan Change    Day 15 of cycle #4 is cancelled due to thrombocytopenia (68,000).      05/02/2017 Imaging    CT abd/pelvis at [facility]-no evidence of metastatic disease or recurrent focal urothelial lesion on noncontrast imaging.  There is mild diffuse bladder wall thickening.  Urothelial evaluation limited without contrast.  Cholelithiasis, sigmoid diverticulosis, aortic atherosclerosis.      07/30/2017 Definitive Surgery    Cystoprostatectomy by [name] with curative intent.      07/30/2017 Pathology Results    Specimen: Bladder and prostate Procedure: Cystoprostatectomy Tumor site (if known): anterior wall Maximum tumor size (cm): 0.4 cm Histology: urothelial carcinoma Grade: high grade Microscopic tumor extension: deeper muscularis propria Lymph - Vascular invasion: Negative Involvement of adjacent organs/structures: Negative Additional epithelial lesions: Carcinoma in situ Margins: negative Lymph nodes: number examined 15 ; number positive 0 TNM code: ypT2b, ypN 0,      07/30/2017 Cancer Staging    Cancer Staging Urothelial carcinoma of bladder ([place]) Staging form: Urinary Bladder, AJCC 8th Edition - Clinical stage from 12/05/2016: Stage IIIA (cT3, cN0, cM0) - Signed by [name], [name] on 12/18/2016 - Pathologic stage from 12/10/2016: pT2, pN0, cM0 - Signed by [name], [name] on 12/10/2016 - Pathologic stage from 07/30/2017: Stage II (ypT2b, pN0, cM0) - Signed by [name], NP on 09/09/2017       11/26/2017 Imaging    CT abd./pelvis- 1. Several mildly enlarged lymph nodes are present including a 1.2 cm peripancreatic lymph node and a 1.4 cm low-density left external iliac lymph node or postoperative fluid collection. These merit surveillance. 2. A right middle lobe subpleural nodule along the minor fissure as a volume of 100 cubic  mm. This level has not been included in prior cross-sectional imaging to assess for stability. Guidelines for follow up do not specifically apply due to history of malignancy. Well likely a benign subpleural lymph node, surveillance is likely warranted. 3. Other imaging findings of potential clinical significance: Aortic Atherosclerosis (ICD10-I70.0) and Emphysema (ICD10-J43.9). Coronary atherosclerosis with mitral and aortic valve calcification. Borderline appearance for distal esophageal wall thickening, query reflux. Several small left lower lobe nodules in the 3-4 mm range are probably inflammatory. Cholelithiasis. Cystoprostatectomy with loop ileostomy. Sigmoid diverticulosis. Notable common and external iliac stenosis bilaterally due to atherosclerosis. Lumbar impingement at L2- 3, L3-4, and L4-5.        Review of Systems  Constitutional: Positive for malaise/fatigue. Negative for chills, fever and weight loss.  HENT: Negative.   Eyes: Negative.   Respiratory: Negative.  Negative for cough.   Cardiovascular: Negative.  Negative for chest pain.  Gastrointestinal: Negative.  Negative for blood in stool, constipation, diarrhea, melena, nausea and vomiting.  Genitourinary: Negative.   Musculoskeletal: Negative.   Skin: Negative.   Neurological: Negative.  Negative for weakness.  Endo/Heme/Allergies: Negative.   Psychiatric/Behavioral: The patient has insomnia.     Past Medical History:  Diagnosis Date  . Arthritis   . Chronic renal disease, stage 3, moderately decreased glomerular filtration rate (GFR) between 30-59 mL/min/1.73 square  meter ([place]) 11/28/2017  . History of kidney stones   . Hypercholesteremia   . Urothelial carcinoma ([place]) 12/10/2016  . Urothelial carcinoma of bladder ([place]) 12/10/2016    Past Surgical History:  Procedure Laterality Date  . CARDIAC CATHETERIZATION    . CYSTOSCOPY WITH INJECTION N/A 07/30/2017   Procedure: CYSTOSCOPY WITH INJECTION OF  INDOCYANINE GREEN DYE;  Surgeon: Alexis Frock, MD;  Location: [facility];  Service: Urology;  Laterality: N/A;  . open reduction and internal fixation leg Right    hip and leg.  Marland Kitchen PORTACATH PLACEMENT N/A 12/23/2016   Procedure: PLACEMENT OF TUNNELED CENTRAL VENOUS CATHETER RIGHT INTERNAL JUGULAR WITH SUBCUTANEOUS PORT;  Surgeon: Vickie Epley, MD;  Location: [facility];  Service: Vascular;  Laterality: N/A;  . reattatchment of left arm     from [place]  . TRANSURETHRAL RESECTION OF BLADDER TUMOR N/A 11/19/2016   Procedure: TRANSURETHRAL RESECTION OF BLADDER TUMOR (TURBT) WITH EPIRUBICIN INJECTION;  Surgeon: Franchot Gallo, MD;  Location: [facility];  Service: Urology;  Laterality: N/A;    Family History  Problem Relation Age of Onset  . Aneurysm Mother   . Diabetes Father   . Colon cancer Brother     Social History   Socioeconomic History  . Marital status: Married    Spouse name: Not on file  . Number of children: Not on file  . Years of education: Not on file  . Highest education level: Not on file  Social Needs  . Financial resource strain: Not on file  . Food insecurity - worry: Not on file  . Food insecurity - inability: Not on file  . Transportation needs - medical: Not on file  . Transportation needs - non-medical: Not on file  Occupational History  . Not on file  Tobacco Use  . Smoking status: Current Every Day Smoker    Packs/day: 2.00    Years: 50.00    Pack years: 100.00    Types: Cigarettes  . Smokeless tobacco: Never Used  Substance and Sexual Activity  . Alcohol use: No  . Drug use: No  . Sexual activity: No    Birth control/protection: None    Comment: married-30 years-2 children by first wife  Other Topics Concern  . Not on file  Social History Narrative  . Not on file     PHYSICAL EXAMINATION  ECOG PERFORMANCE STATUS: 1 - Symptomatic but completely ambulatory  Vitals:   11/28/17 1425  BP: 129/63  Pulse: (!) 104  Resp: 18  Temp: 98.4 F  (36.9 C)  SpO2: 98%    GENERAL:alert, no distress, well nourished, well developed, comfortable, cooperative, smiling and chronically ill appearing, appearing older than his stated age, accompanied by his wife. SKIN: skin color, texture, turgor are normal, no rashes or significant lesions HEAD: Normocephalic, No masses, lesions, tenderness or abnormalities EYES: normal, EOMI, Conjunctiva are pink and non-injected EARS: External ears normal OROPHARYNX:lips, buccal mucosa, and tongue normal  NECK: supple, no adenopathy, trachea midline LYMPH:  no palpable lymphadenopathy, no hepatosplenomegaly BREAST:not examined LUNGS: clear to auscultation and percussion, decreased breath sounds HEART: regular rate & rhythm, no murmurs and no gallops ABDOMEN:abdomen soft, obese, normal bowel sounds and no masses or organomegaly BACK: Back symmetric, no curvature. EXTREMITIES:less then 2 second capillary refill, no joint deformities, effusion, or inflammation, no skin discoloration, no cyanosis, positive findings:  Yellowing of fingernails from tobacco abuse.  NEURO: alert & oriented x 3 with fluent speech, no focal motor/sensory deficits, gait normal  LABORATORY DATA: CBC    Component Value Date/Time   WBC 10.4 11/24/2017 1039   RBC 4.18 (L) 11/24/2017 1039   HGB 12.0 (L) 11/24/2017 1039   HCT 39.3 11/24/2017 1039   PLT 300 11/24/2017 1039   MCV 94.0 11/24/2017 1039   MCH 28.7 11/24/2017 1039   MCHC 30.5 11/24/2017 1039   RDW 15.5 11/24/2017 1039   LYMPHSABS 2.7 11/24/2017 1039   MONOABS 0.9 11/24/2017 1039   EOSABS 0.4 11/24/2017 1039   BASOSABS 0.0 11/24/2017 1039      Chemistry      Component Value Date/Time   NA 137 11/24/2017 1039   K 5.5 (H) 11/24/2017 1039   CL 110 11/24/2017 1039   CO2 21 (L) 11/24/2017 1039   BUN 21 (H) 11/24/2017 1039   CREATININE 1.56 (H) 11/24/2017 1039      Component Value Date/Time   CALCIUM 9.6 11/24/2017 1039   ALKPHOS 113 11/24/2017 1039   AST 14  (L) 11/24/2017 1039   ALT 11 (L) 11/24/2017 1039   BILITOT 0.4 11/24/2017 1039        PENDING LABS:   RADIOGRAPHIC STUDIES:  Ct Chest W Contrast  Result Date: 11/26/2017 CLINICAL DATA:  Bladder urothelial carcinoma, status post TURBT, restaging EXAM: CT CHEST, ABDOMEN, AND PELVIS WITH CONTRAST TECHNIQUE: Multidetector CT imaging of the chest, abdomen and pelvis was performed following the standard protocol during bolus administration of intravenous contrast. CONTRAST:  28mL ISOVUE-300 IOPAMIDOL (ISOVUE-300) INJECTION 61% COMPARISON:  05/02/2017 FINDINGS: CT CHEST FINDINGS Cardiovascular: Right Port-A-Cath tip:  Cavoatrial junction. Coronary, aortic arch, and branch vessel atherosclerotic vascular disease. Mitral and aortic valve calcification. Mediastinum/Nodes: Borderline appearance for distal esophageal wall thickening. No pathologic thoracic adenopathy. Lungs/Pleura: Centrilobular emphysema. Right middle lobe subpleural nodule along the minor fissure measures 10 by 8 by 3 mm (volume = 100 mm^3) and has not been previously included in cross-sectional imaging. 3 by 4 mm left lower lobe pulmonary nodule on image 81/7. Indistinctly marginated 4 mm left lower lobe nodule on image 84/7. Musculoskeletal: Mild thoracic spondylosis. CT ABDOMEN PELVIS FINDINGS Hepatobiliary: 1.4 cm gallstone within the gallbladder. 4 mm chronically stable hypodense lesion in segment 5 of the liver. 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 Slightly thick left adrenal gland without discrete left adrenal mass. Vascular calcifications in the renal hila. No hydronephrosis or hydroureter. Right lower quadrant loop ileostomy noted. Cystoprostatectomy. No appreciable filling defect along the urothelium. Stomach/Bowel: Sigmoid diverticulosis without active diverticulitis. Postoperative findings related to ileal harvesting for ileal loop.  Vascular/Lymphatic: Aortoiliac atherosclerotic vascular disease. A peripancreatic node is mildly enlarged, measuring 1.2 cm in short axis on image 61/3, previously 1.0 cm on 11/15/2016. Portacaval node 1.0 cm in short axis on image 65/3, previously 0.9 cm. Retrocaval node 0.9 cm in short axis on image 68/3, stable. Notable atherosclerotic stenosis of the common iliac and external iliac arteries. Low-density left external iliac structure measuring 1.4 cm in short axis on image 111/3, favor low-density adenopathy over postoperative fluid collection. Reproductive: Cystoprostatectomy. Other: No supplemental non-categorized findings. Musculoskeletal: Deformity from old left pelvic fractures. Right femoral intramedullary nail. Degenerative disc disease at L2-3 with loss of disc height and grade 1 degenerative retrolisthesis. Spondylosis and degenerative disc disease cause left lateral extraforaminal impingement at L2-3 as well as bilateral foraminal impingement at L3-4 and L4-5. Low-density eccentric to the right in the T12 vertebral body is chronically stable and probably due to a vertebral hemangioma. IMPRESSION: 1.  Several mildly enlarged lymph nodes are present including a 1.2 cm peripancreatic lymph node and a 1.4 cm low-density left external iliac lymph node or postoperative fluid collection. These merit surveillance. 2. A right middle lobe subpleural nodule along the minor fissure as a volume of 100 cubic mm. This level has not been included in prior cross-sectional imaging to assess for stability. Guidelines for follow up do not specifically apply due to history of malignancy. Well likely a benign subpleural lymph node, surveillance is likely warranted. 3. Other imaging findings of potential clinical significance: Aortic Atherosclerosis (ICD10-I70.0) and Emphysema (ICD10-J43.9). Coronary atherosclerosis with mitral and aortic valve calcification. Borderline appearance for distal esophageal wall thickening, query  reflux. Several small left lower lobe nodules in the 3-4 mm range are probably inflammatory. Cholelithiasis. Cystoprostatectomy with loop ileostomy. Sigmoid diverticulosis. Notable common and external iliac stenosis bilaterally due to atherosclerosis. Lumbar impingement at L2- 3, L3-4, and L4-5. Electronically Signed   By: Van Clines M.D.   On: 11/26/2017 16:39   Ct Abdomen Pelvis W Contrast  Result Date: 11/26/2017 CLINICAL DATA:  Bladder urothelial carcinoma, status post TURBT, restaging EXAM: CT CHEST, ABDOMEN, AND PELVIS WITH CONTRAST TECHNIQUE: Multidetector CT imaging of the chest, abdomen and pelvis was performed following the standard protocol during bolus administration of intravenous contrast. CONTRAST:  56mL ISOVUE-300 IOPAMIDOL (ISOVUE-300) INJECTION 61% COMPARISON:  05/02/2017 FINDINGS: CT CHEST FINDINGS Cardiovascular: Right Port-A-Cath tip:  Cavoatrial junction. Coronary, aortic arch, and branch vessel atherosclerotic vascular disease. Mitral and aortic valve calcification. Mediastinum/Nodes: Borderline appearance for distal esophageal wall thickening. No pathologic thoracic adenopathy. Lungs/Pleura: Centrilobular emphysema. Right middle lobe subpleural nodule along the minor fissure measures 10 by 8 by 3 mm (volume = 100 mm^3) and has not been previously included in cross-sectional imaging. 3 by 4 mm left lower lobe pulmonary nodule on image 81/7. Indistinctly marginated 4 mm left lower lobe nodule on image 84/7. Musculoskeletal: Mild thoracic spondylosis. CT ABDOMEN PELVIS FINDINGS Hepatobiliary: 1.4 cm gallstone within the gallbladder. 4 mm chronically stable hypodense lesion in segment 5 of the liver. 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 Slightly thick left adrenal gland without discrete left adrenal mass. Vascular calcifications in the renal hila. No hydronephrosis or hydroureter. Right  lower quadrant loop ileostomy noted. Cystoprostatectomy. No appreciable filling defect along the urothelium. Stomach/Bowel: Sigmoid diverticulosis without active diverticulitis. Postoperative findings related to ileal harvesting for ileal loop. Vascular/Lymphatic: Aortoiliac atherosclerotic vascular disease. A peripancreatic node is mildly enlarged, measuring 1.2 cm in short axis on image 61/3, previously 1.0 cm on 11/15/2016. Portacaval node 1.0 cm in short axis on image 65/3, previously 0.9 cm. Retrocaval node 0.9 cm in short axis on image 68/3, stable. Notable atherosclerotic stenosis of the common iliac and external iliac arteries. Low-density left external iliac structure measuring 1.4 cm in short axis on image 111/3, favor low-density adenopathy over postoperative fluid collection. Reproductive: Cystoprostatectomy. Other: No supplemental non-categorized findings. Musculoskeletal: Deformity from old left pelvic fractures. Right femoral intramedullary nail. Degenerative disc disease at L2-3 with loss of disc height and grade 1 degenerative retrolisthesis. Spondylosis and degenerative disc disease cause left lateral extraforaminal impingement at L2-3 as well as bilateral foraminal impingement at L3-4 and L4-5. Low-density eccentric to the right in the T12 vertebral body is chronically stable and probably due to a vertebral hemangioma. IMPRESSION: 1. Several mildly enlarged lymph nodes are present including a 1.2 cm peripancreatic lymph node and a 1.4 cm low-density left external iliac lymph  node or postoperative fluid collection. These merit surveillance. 2. A right middle lobe subpleural nodule along the minor fissure as a volume of 100 cubic mm. This level has not been included in prior cross-sectional imaging to assess for stability. Guidelines for follow up do not specifically apply due to history of malignancy. Well likely a benign subpleural lymph node, surveillance is likely warranted. 3. Other imaging  findings of potential clinical significance: Aortic Atherosclerosis (ICD10-I70.0) and Emphysema (ICD10-J43.9). Coronary atherosclerosis with mitral and aortic valve calcification. Borderline appearance for distal esophageal wall thickening, query reflux. Several small left lower lobe nodules in the 3-4 mm range are probably inflammatory. Cholelithiasis. Cystoprostatectomy with loop ileostomy. Sigmoid diverticulosis. Notable common and external iliac stenosis bilaterally due to atherosclerosis. Lumbar impingement at L2- 3, L3-4, and L4-5. Electronically Signed   By: Van Clines M.D.   On: 11/26/2017 16:39     PATHOLOGY:    ASSESSMENT AND PLAN:  Urothelial carcinoma of bladder (HCC) Invasive urothelial carcinoma (pT2N0) with TURBT by [name] on 11/19/2016 and epirubicin intra-bladder treatment BUT with concerns for clinically early T3 disease.  He is S/P systemic chemotherapy consisting of Cisplatin/gemcitabinex 4 cycles (12/30/2016- 04/21/2017).  He then underwent definitive surgical management with cystoprostatectomy by 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  Post-surgery restaging scans on 11/26/2017 revealed several mildly enlarged lymph nodes (1.2 cm peripancreatic lymph node and 1.4 cm left external iliac lymph node versus post-operative fluid collection), RML subpleural nodule along the minor fissure.  Oncology history is updated.  Labs on 11/24/2017: CBC diff, CMET, Magnesium.  I personally reviewed and went over laboratory results with the patient.  The results are noted within this dictation.mild anemia is improved compared to October laboratory work. White blood cell count and platelet count are within normal limits. Metabolic panel  demonstrates a stage III chronic renal disease and his potassium is elevated.  Will prescribe Kayexalate x 1 for hyperkalemia.  Repeat K+ in 1 week.  Will refer patient to nephrologist for chronic renal disease.  Labs in 2 months: CBC diff, CMET.  I personally reviewed and went over radiographic studies with the patient.  The results are noted within this dictation.  I personally reviewed the images in PACS.  Post-operative CT abd/pelvis imaging on 11/24/2017 demonstrates some enlarged lymph nodes in the abdomen as described above.    Give his high-risk disease, and abnormal CT imaging, it is most reasonable to rescan him in 8 weeks.  Orders placed for CT CAP w contrast.  I have added a CT of chest to this order due to the identified RML nodule and his history of tobacco abuse.  Return in 2 months for follow-up after scans.  Based upon scan results, he may need PET scan.  If scans demonstrate progression of disease, he may be a candidate for immunotherapy (ie Atezolizumab/Keytruda).  I do not believe he is a cisplatin candidate.   2. Hyperkalemia Potassium 5.6 on 11/24/2017. Kayexalate is sent to his pharmacy. This is likely secondary to his renal disease. - Potassium; Future - Comprehensive metabolic panel; Future - sodium polystyrene (KAYEXALATE) powder; Take by mouth once for 1 dose. Take 30 gram 1 time today.  Dispense: 454 g; Refill: 0  3. Tobacco abuse Smoking cessation education provided today. Patient is planning  on quitting smoking tomorrow.  4. Other insomnia Trazodone is refill today for a 1 month supply. He is working towards ascertaining a new primary care provider. Once established, we will defer insomnia management to primary care.  5. Chronic renal disease, stage 3, moderately decreased glomerular filtration rate (GFR) between 30-59 mL/min/1.73 square meter (HCC) Stable chronic renal disease, predating his systemic chemotherapy with cisplatin. We will refer patient to  nephrology for evaluation and management.   Final Result of Complexity      Choose decision making level with 2 or 3 checks OR choose the decision making level on Section B       A Number of diagnoses or treatment options  []   </= 1 Minimal  []   2 Limited  []   3 Multiple  [x]   >/= 4 Extensive  B Amount and complexity of data  []   </= 1 Minimal or low  []   2 Limited  []   3  Moderate  [x]   >/= 4 Extensive  C Highest risk  []   Minimal  []   Low  [x]   Moderate  []   High   Type of decision making  []   Straight-forward  []   Low Complexity  []   Moderate- Complexity  [x]   High- Complexity     ORDERS PLACED FOR THIS ENCOUNTER: Orders Placed This Encounter  Procedures  . CT Chest W Contrast  . CT Abdomen Pelvis W Contrast  . Potassium  . CBC with Differential  . Comprehensive metabolic panel    MEDICATIONS PRESCRIBED THIS ENCOUNTER: Meds ordered this encounter  Medications  . sodium polystyrene (KAYEXALATE) powder    Sig: Take by mouth once for 1 dose. Take 30 gram 1 time today.    Dispense:  454 g    Refill:  0    Order Specific Question:   Supervising Provider    Answer:   Brunetta Genera [[id number]]  . traZODone (DESYREL) 100 MG tablet    Sig: Take 1 tablet (100 mg total) by mouth at bedtime.    Dispense:  30 tablet    Refill:  0    Temporary prescription until he re-establishes his care with primary care provider.    Order Specific Question:   Supervising Provider    Answer:   Brunetta Genera [[id number]]    THERAPY PLAN:  Will perform a short interval restaging CT scan due to lymphadenopathy identified on CT imaging in December 2018. This is concerning for recurrence/relapse of disease. If lymph nodes remain enlarged and/or progress, restaging PET scan may be needed plus or minus rebiopsy to confirm metastatic disease. He is not a cisplatin candidate in my opinion but, he would be a candidate for immunotherapy such as Keytruda versus Tecentriq  All  questions were answered. The patient knows to call the clinic with any problems, questions or concerns. We can certainly see the patient much sooner if necessary.  Patient and plan discussed with Dr. Twana First and she is in agreement with the aforementioned.   This note is electronically signed by: Doy Mince 11/28/2017 3:08 PM

## 2017-11-28 NOTE — Assessment & Plan Note (Addendum)
Invasive urothelial carcinoma (pT2N0) with TURBT by [name] on 11/19/2016 and epirubicin intra-bladder treatment BUT with concerns for clinically early T3 disease.  He is S/P systemic chemotherapy consisting of Cisplatin/gemcitabinex 4 cycles (12/30/2016- 04/21/2017).  He then underwent definitive surgical management with cystoprostatectomy by 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  Post-surgery restaging scans on 11/26/2017 revealed several mildly enlarged lymph nodes (1.2 cm peripancreatic lymph node and 1.4 cm left external iliac lymph node versus post-operative fluid collection), RML subpleural nodule along the minor fissure.  Oncology history is updated.  Labs on 11/24/2017: CBC diff, CMET, Magnesium.  I personally reviewed and went over laboratory results with the patient.  The results are noted within this dictation.mild anemia is improved compared to October laboratory work. White blood cell count and platelet count are within normal limits. Metabolic panel demonstrates a stage III chronic renal disease and his potassium is elevated.  Will prescribe Kayexalate x 1 for hyperkalemia.  Repeat K+ in 1 week.  Will refer patient to nephrologist for chronic renal disease.  Labs in 2 months: CBC diff, CMET.  I personally reviewed and went over radiographic studies with the patient.  The results are noted within this dictation.  I personally reviewed the images in PACS.  Post-operative CT abd/pelvis imaging on 11/24/2017 demonstrates some enlarged lymph nodes in the abdomen as described above.    Give his high-risk disease, and abnormal CT imaging, it is most reasonable to rescan him in 8 weeks.  Orders placed for CT CAP w contrast.  I have added a CT  of chest to this order due to the identified RML nodule and his history of tobacco abuse.  Return in 2 months for follow-up after scans.  Based upon scan results, he may need PET scan.  If scans demonstrate progression of disease, he may be a candidate for immunotherapy (ie Atezolizumab/Keytruda).  I do not believe he is a cisplatin candidate.

## 2017-12-05 ENCOUNTER — Encounter (HOSPITAL_COMMUNITY): Payer: Medicare HMO

## 2017-12-05 DIAGNOSIS — C679 Malignant neoplasm of bladder, unspecified: Secondary | ICD-10-CM | POA: Diagnosis not present

## 2017-12-05 DIAGNOSIS — E875 Hyperkalemia: Secondary | ICD-10-CM

## 2017-12-05 LAB — POTASSIUM: Potassium: 4.7 mmol/L (ref 3.5–5.1)

## 2017-12-12 ENCOUNTER — Other Ambulatory Visit (HOSPITAL_COMMUNITY): Payer: Medicare HMO

## 2017-12-15 ENCOUNTER — Ambulatory Visit (HOSPITAL_COMMUNITY): Payer: Medicare HMO

## 2017-12-17 ENCOUNTER — Ambulatory Visit (HOSPITAL_COMMUNITY): Payer: Medicare HMO

## 2018-01-22 ENCOUNTER — Inpatient Hospital Stay (HOSPITAL_COMMUNITY): Payer: Medicare HMO | Attending: Internal Medicine

## 2018-01-22 DIAGNOSIS — C679 Malignant neoplasm of bladder, unspecified: Secondary | ICD-10-CM

## 2018-01-22 DIAGNOSIS — E875 Hyperkalemia: Secondary | ICD-10-CM

## 2018-01-22 LAB — CBC WITH DIFFERENTIAL/PLATELET
Basophils Absolute: 0 10*3/uL (ref 0.0–0.1)
Basophils Relative: 0 %
Eosinophils Absolute: 0.1 10*3/uL (ref 0.0–0.7)
Eosinophils Relative: 1 %
HEMATOCRIT: 37.2 % — AB (ref 39.0–52.0)
HEMOGLOBIN: 12.1 g/dL — AB (ref 13.0–17.0)
LYMPHS ABS: 2.2 10*3/uL (ref 0.7–4.0)
Lymphocytes Relative: 17 %
MCH: 29.6 pg (ref 26.0–34.0)
MCHC: 32.5 g/dL (ref 30.0–36.0)
MCV: 91 fL (ref 78.0–100.0)
MONOS PCT: 5 %
Monocytes Absolute: 0.7 10*3/uL (ref 0.1–1.0)
NEUTROS ABS: 9.9 10*3/uL — AB (ref 1.7–7.7)
NEUTROS PCT: 77 %
Platelets: 232 10*3/uL (ref 150–400)
RBC: 4.09 MIL/uL — ABNORMAL LOW (ref 4.22–5.81)
RDW: 14.8 % (ref 11.5–15.5)
WBC: 13 10*3/uL — ABNORMAL HIGH (ref 4.0–10.5)

## 2018-01-22 LAB — COMPREHENSIVE METABOLIC PANEL
ALT: 13 U/L — AB (ref 17–63)
ANION GAP: 11 (ref 5–15)
AST: 25 U/L (ref 15–41)
Albumin: 4.1 g/dL (ref 3.5–5.0)
Alkaline Phosphatase: 105 U/L (ref 38–126)
BUN: 30 mg/dL — ABNORMAL HIGH (ref 6–20)
CO2: 19 mmol/L — AB (ref 22–32)
CREATININE: 1.59 mg/dL — AB (ref 0.61–1.24)
Calcium: 9.3 mg/dL (ref 8.9–10.3)
Chloride: 104 mmol/L (ref 101–111)
GFR calc non Af Amer: 42 mL/min — ABNORMAL LOW (ref >60)
GFR, EST AFRICAN AMERICAN: 48 mL/min — AB (ref >60)
Glucose, Bld: 132 mg/dL — ABNORMAL HIGH (ref 65–99)
POTASSIUM: 3.8 mmol/L (ref 3.5–5.1)
SODIUM: 134 mmol/L — AB (ref 135–145)
Total Bilirubin: 0.3 mg/dL (ref 0.3–1.2)
Total Protein: 7.6 g/dL (ref 6.5–8.1)

## 2018-01-22 LAB — MAGNESIUM: MAGNESIUM: 1.8 mg/dL (ref 1.7–2.4)

## 2018-01-29 ENCOUNTER — Ambulatory Visit (HOSPITAL_COMMUNITY)
Admission: RE | Admit: 2018-01-29 | Discharge: 2018-01-29 | Disposition: A | Discharge status: Home / Self Care | Payer: Medicare HMO | Source: Ambulatory Visit | Attending: Adult Health | Admitting: Adult Health

## 2018-01-29 DIAGNOSIS — Z906 Acquired absence of other parts of urinary tract: Secondary | ICD-10-CM | POA: Insufficient documentation

## 2018-01-29 DIAGNOSIS — C679 Malignant neoplasm of bladder, unspecified: Secondary | ICD-10-CM | POA: Insufficient documentation

## 2018-01-29 DIAGNOSIS — Z9079 Acquired absence of other genital organ(s): Secondary | ICD-10-CM | POA: Diagnosis not present

## 2018-01-29 DIAGNOSIS — R918 Other nonspecific abnormal finding of lung field: Secondary | ICD-10-CM | POA: Insufficient documentation

## 2018-01-29 MED ORDER — IOPAMIDOL (ISOVUE-300) INJECTION 61%
80.0000 mL | Freq: Once | INTRAVENOUS | Status: AC | PRN
  Administered 2018-01-29: 80 mL via INTRAVENOUS

## 2018-01-30 ENCOUNTER — Ambulatory Visit (HOSPITAL_COMMUNITY): Payer: Medicare HMO | Admitting: Internal Medicine

## 2018-01-30 ENCOUNTER — Other Ambulatory Visit (HOSPITAL_COMMUNITY): Payer: Self-pay | Admitting: *Deleted

## 2018-01-30 DIAGNOSIS — C679 Malignant neoplasm of bladder, unspecified: Secondary | ICD-10-CM

## 2018-02-02 ENCOUNTER — Ambulatory Visit (HOSPITAL_COMMUNITY): Payer: Medicare HMO | Admitting: Internal Medicine

## 2018-02-13 IMAGING — CT CT ABD-PEL WO/W CM
2 of 9 series · 11 of 46 positions shown, 18 images · IV contrast (Isovue)
Comparison: None.

CLINICAL DATA: Gross hematuria x 4 weeks, mass on cystoscopy

EXAM:
CT ABDOMEN AND PELVIS WITHOUT AND WITH CONTRAST
TECHNIQUE: Multidetector CT imaging of the abdomen and pelvis was performed
following the standard protocol before and following the bolus
administration of intravenous contrast.
CONTRAST:  125mL [id number] IOPAMIDOL ([id number]) INJECTION 61%

[Series 3: axial post · axial · 0.73mm/px · z∈[-592,-192]mm · 9 of 102 slices shown, 15 images]
[im 11/102  soft-tissue]
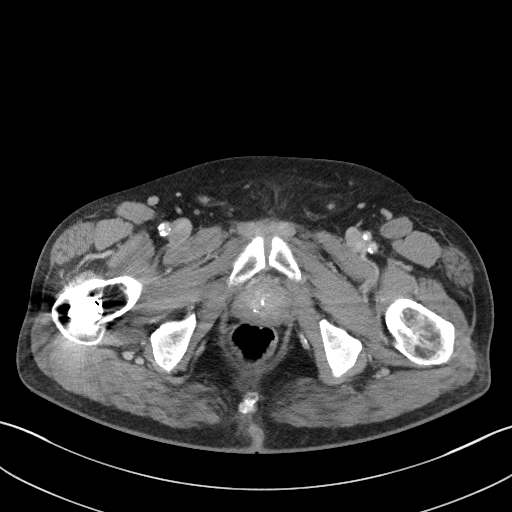
[im 11/102  bone]
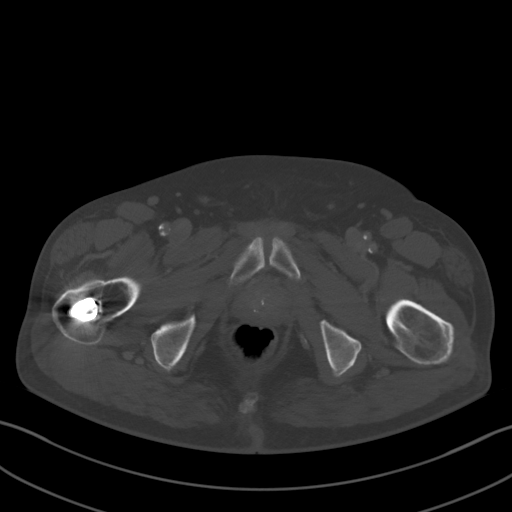
[im 21/102  soft-tissue]
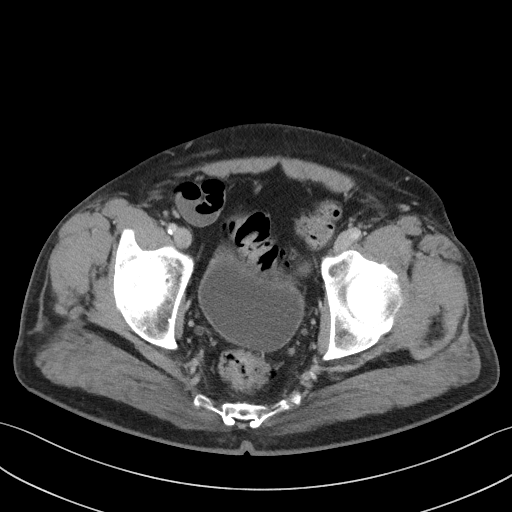
[im 31/102  soft-tissue]
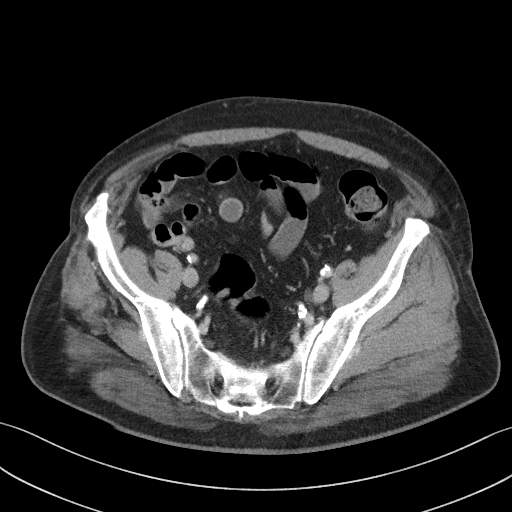
[im 41/102  soft-tissue]
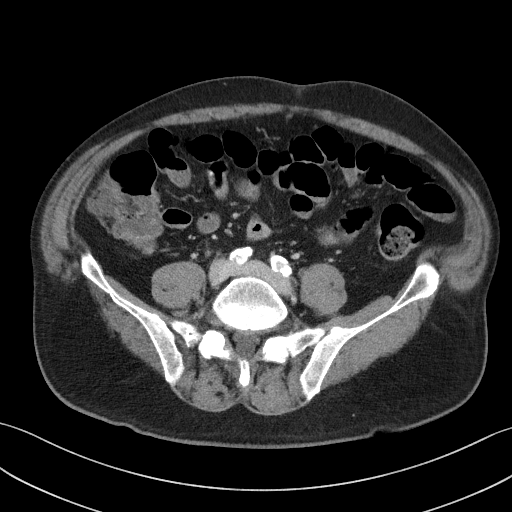
[im 51/102  soft-tissue]
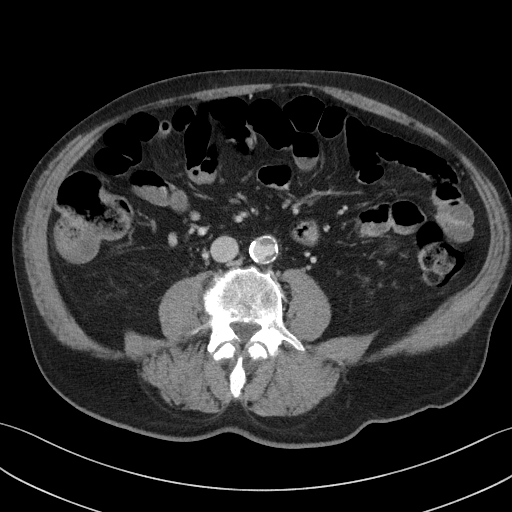
[im 61/102  soft-tissue]
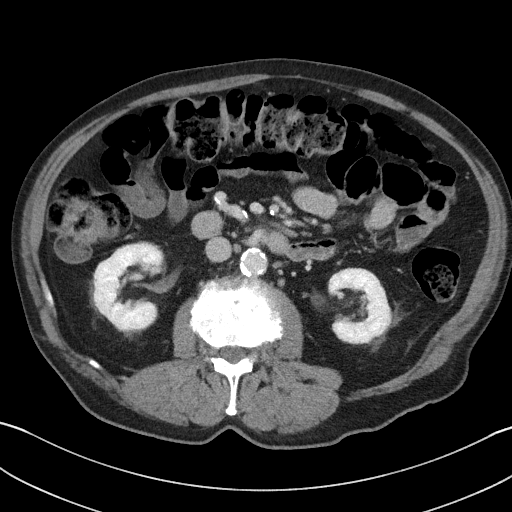
[im 61/102  lung]
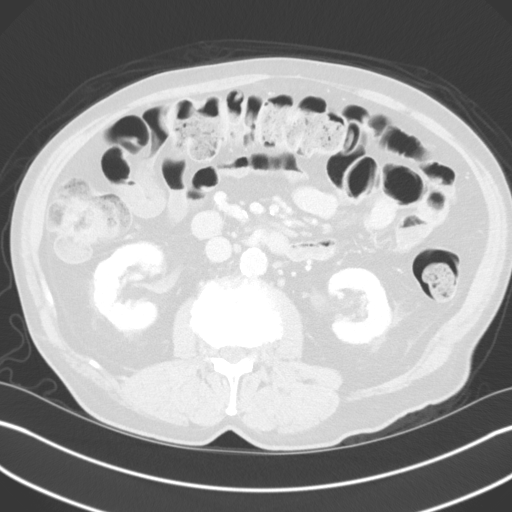
[im 71/102  soft-tissue]
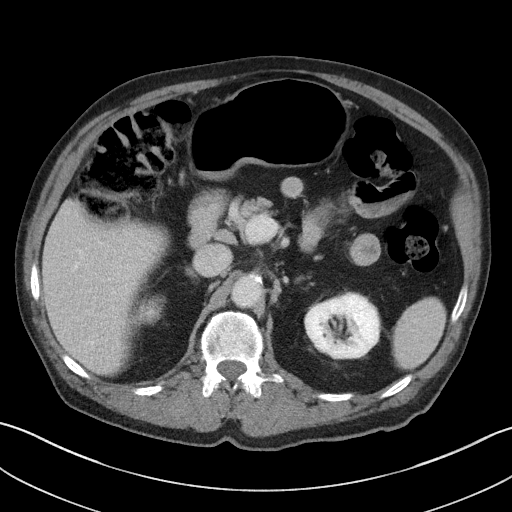
[im 71/102  lung]
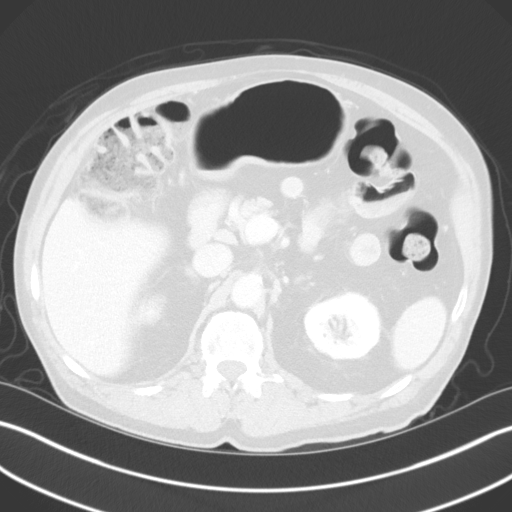
[im 81/102  soft-tissue]
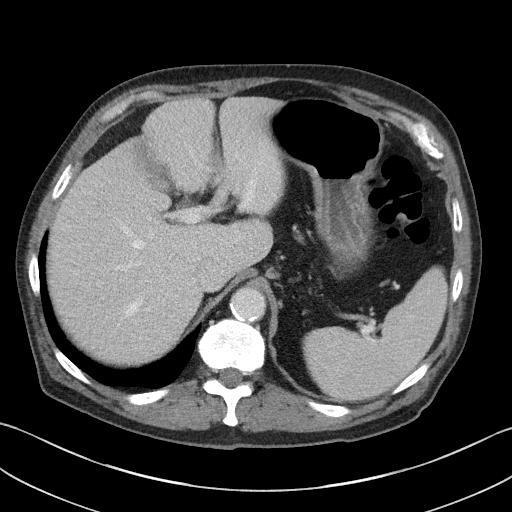
[im 81/102  lung]
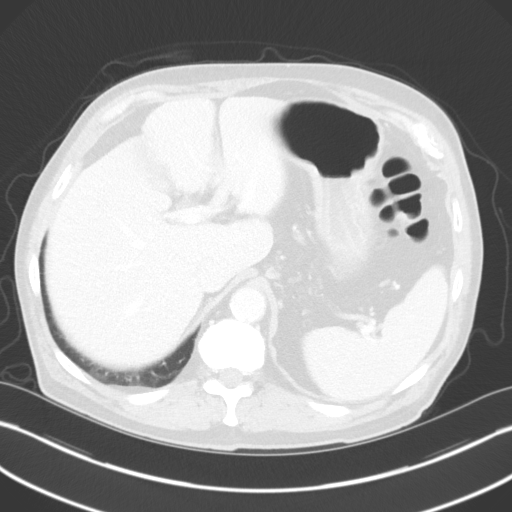
[im 91/102  soft-tissue]
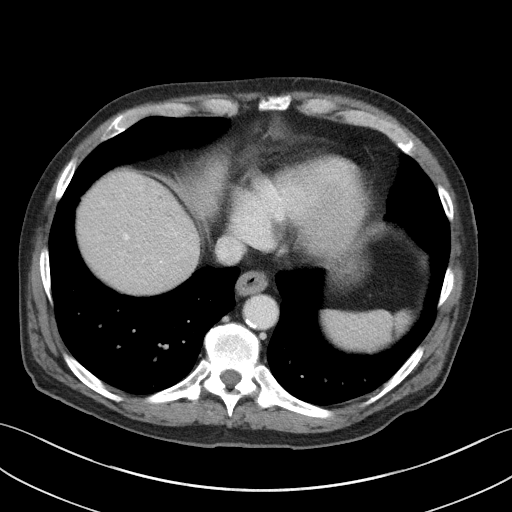
[im 91/102  lung]
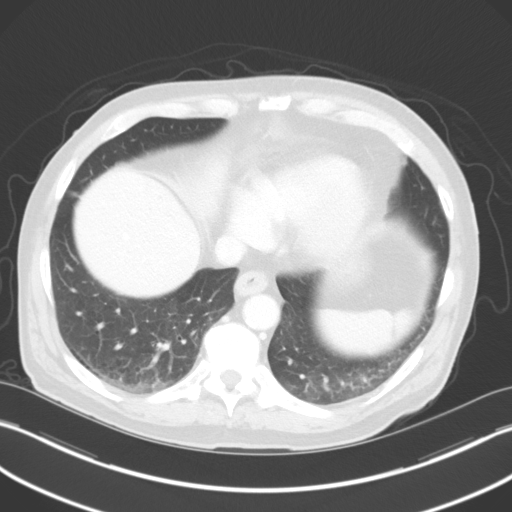
[im 91/102  bone]
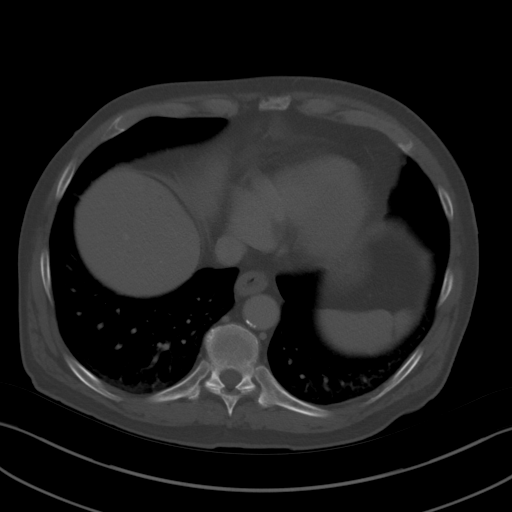

[Series 5: coronal pre · coronal · non-contrast · 0.80mm/px · 2 of 101 slices shown, 3 images]
[im 34/101  soft-tissue]
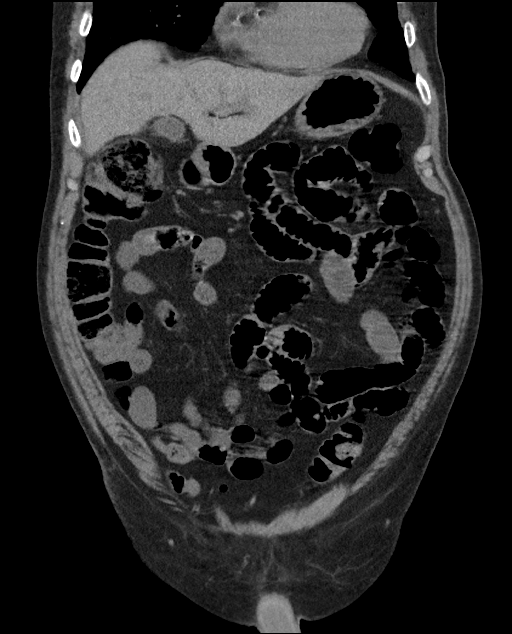
[im 34/101  bone]
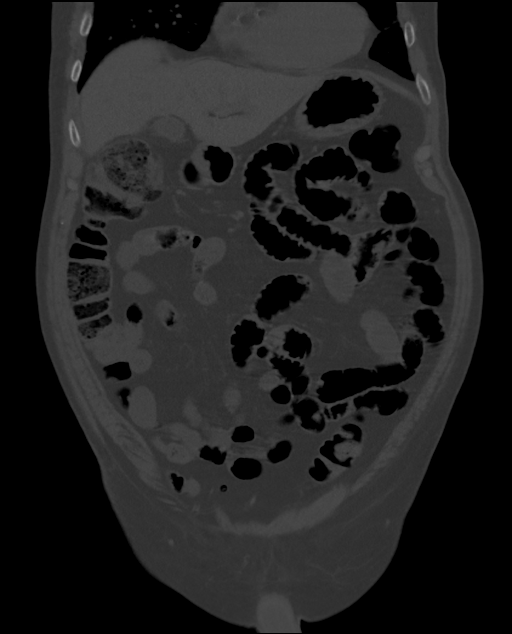
[im 67/101  soft-tissue]
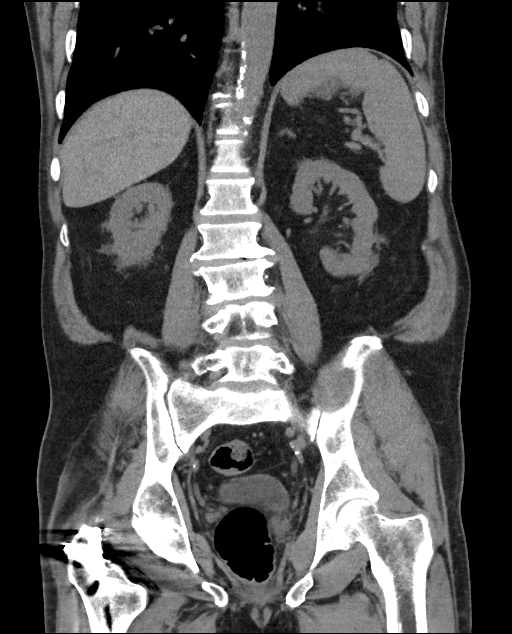

[11 of 46 positions shown; findings below may reference images not displayed]

FINDINGS: Lower chest: Mild dependent atelectasis at the lung bases.

Hepatobiliary: Liver is notable for a 4 mm cyst in segment 5 (series
3/ image 21).

Layering 15 mm gallstone (series 3/image 27), without associated
inflammatory changes. No intrahepatic or extrahepatic ductal
dilatation.

Pancreas: Within normal limits.

Spleen: Within normal limits.

Adrenals/Urinary Tract: Renal glands are within normal limits.

Kidneys are within normal limits.  No enhancing renal lesions.

No renal, ureteral, or bladder calculi.  No hydronephrosis.

Enhancing soft tissue lesion along the left bladder base (series 4/
image 87), measuring approximately 3.2 x 2.7 cm (series 3/ image
89). This appearance is worrisome for primary bladder neoplasm.
Exophytic growth of the prostate (BPH versus tumor) is considered
less likely.

On delayed imaging, there are no filling defects in the opacified
portions of the bilateral renal collecting systems or ureters.

Stomach/Bowel: Stomach is within normal limits.

No evidence of bowel obstruction.

Appendix is not discretely visualized.

Mild sigmoid diverticulosis, without evidence of diverticulitis.

Vascular/Lymphatic: No evidence of abdominal aortic aneurysm.

Atherosclerotic calcifications of the abdominal aorta and branch
vessels.

No suspicious abdominopelvic lymphadenopathy.

Reproductive: Prostate is notable for mild central gland nodularity.

Other: No abdominopelvic ascites.

Musculoskeletal: Status post ORIF of the right proximal femur.

Degenerative changes of the lumbar spine, most prominent at L2-3.

No focal osseous lesions.
IMPRESSION: Enhancing soft tissue lesion along the left bladder base, measuring
approximately 3.2 x 2.7 cm, worrisome for primary bladder neoplasm.
Correlate with tissue sampling on cystoscopy.

No findings suspicious for upper tract disease.

Cholelithiasis, without associated inflammatory changes.

Additional ancillary findings as above.

## 2018-03-23 IMAGING — CR DG CHEST 1V PORT
1 series · 1 of 1 positions shown · non-contrast
Comparison: None.

CLINICAL DATA: Port-A-Cath insertion

EXAM:
PORTABLE CHEST 1 VIEW

[ap portable]
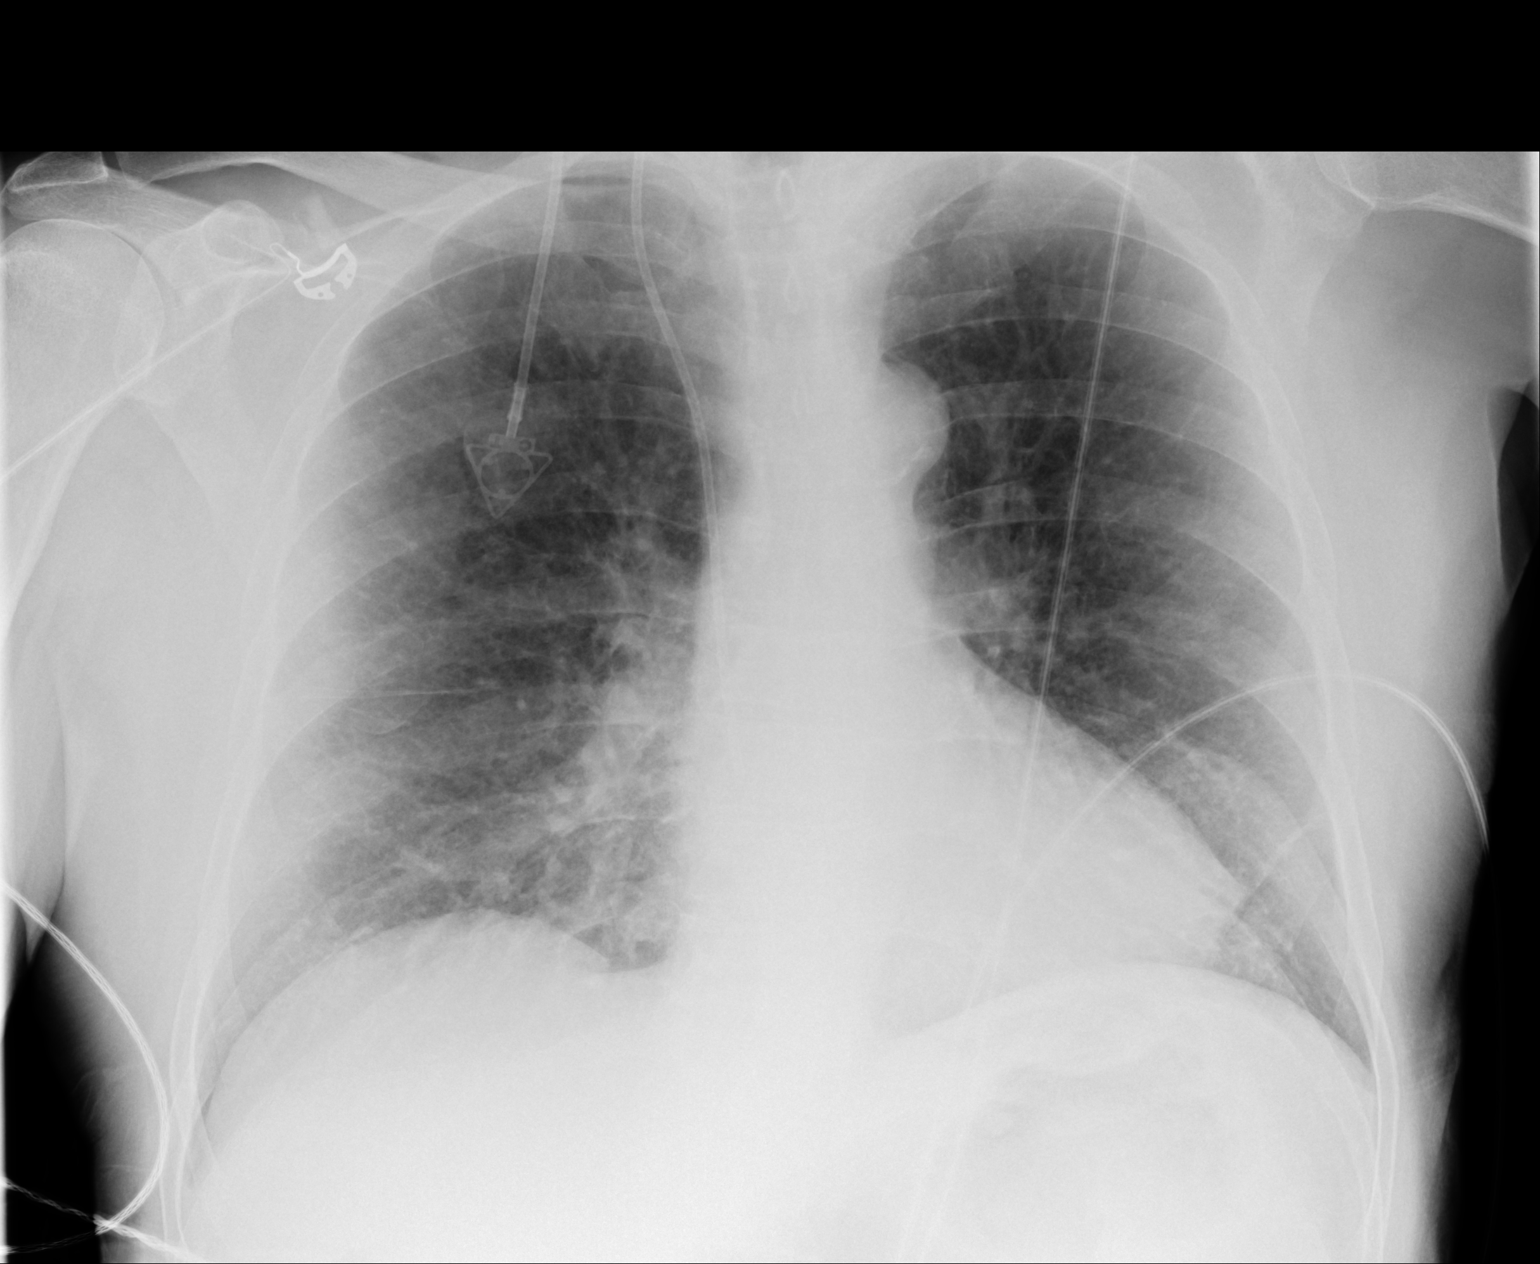

[1 of 1 positions shown; findings below may reference images not displayed]

FINDINGS: Right-sided Port-A-Cath with the tip projecting over the cavoatrial
junction. There is no focal parenchymal opacity. There is no pleural
effusion or pneumothorax. The heart and mediastinal contours are
unremarkable.

The osseous structures are unremarkable.
IMPRESSION: Right-sided Port-A-Cath with the tip projecting over the cavoatrial
junction.

## 2018-04-01 ENCOUNTER — Inpatient Hospital Stay (HOSPITAL_COMMUNITY): Payer: Medicare HMO | Attending: Internal Medicine | Admitting: Internal Medicine

## 2018-04-01 ENCOUNTER — Encounter (HOSPITAL_COMMUNITY): Payer: Self-pay | Admitting: Internal Medicine

## 2018-04-01 VITALS — BP 121/48 | HR 88 | Temp 98.2°F | Resp 18 | Wt 162.2 lb

## 2018-04-01 DIAGNOSIS — C678 Malignant neoplasm of overlapping sites of bladder: Secondary | ICD-10-CM

## 2018-04-01 DIAGNOSIS — N189 Chronic kidney disease, unspecified: Secondary | ICD-10-CM | POA: Diagnosis not present

## 2018-04-01 DIAGNOSIS — G4700 Insomnia, unspecified: Secondary | ICD-10-CM | POA: Insufficient documentation

## 2018-04-01 DIAGNOSIS — C679 Malignant neoplasm of bladder, unspecified: Secondary | ICD-10-CM

## 2018-04-01 DIAGNOSIS — R911 Solitary pulmonary nodule: Secondary | ICD-10-CM

## 2018-04-01 NOTE — Patient Instructions (Signed)
[facility]  Discharge Instructions:  You were seen by Dr. Higgs today _______________________________________________________________  Thank you for choosing [facility] at [facility] to provide your oncology and hematology care.  To afford each patient quality time with our providers, please arrive at least 15 minutes before your scheduled appointment.  You need to re-schedule your appointment if you arrive 10 or more minutes late.  We strive to give you quality time with our providers, and arriving late affects you and other patients whose appointments are after yours.  Also, if you no show three or more times for appointments you may be dismissed from the clinic.  Again, thank you for choosing [facility] at [facility]. Our hope is that these requests will allow you access to exceptional care and in a timely manner. _______________________________________________________________  If you have questions after your visit, please contact our office at [phone] between the hours of 8:30 a.m. and 5:00 p.m. Voicemails left after 4:30 p.m. will not be returned until the following business day. _______________________________________________________________  For prescription refill requests, have your pharmacy contact our office. _______________________________________________________________  Recommendations made by the consultant and any test results will be sent to your referring physician. _______________________________________________________________ 

## 2018-04-01 NOTE — Progress Notes (Signed)
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  Urothelial cancer of bladder.  Pt  Was previously followed by [name].  He was diagnosed with Invasive urothelial carcinoma (pT2N0) with TURBT by [name] on 11/19/2016 and epirubicin intra-bladder treatment BUT with concerns for clinically early T3 disease.  He is S/P systemic chemotherapy consisting of Cisplatin/gemcitabinex 4 cycles (12/30/2016- 04/21/2017).  He then underwent definitive surgical management with cystoprostatectomy by 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  Post-surgery restaging scans on 11/26/2017 revealed several mildly enlarged lymph nodes (1.2 cm peripancreatic lymph node and 1.4 cm left external iliac lymph node versus post-operative fluid collection), RML subpleural nodule along the minor fissure.  He was set up for CT scan due to lymphadenopathy identified on CT imaging in December 2018. This is concerning for  recurrence/relapse of disease. If lymph nodes remain enlarged and/or progress, restaging PET scan may be needed plus or minus rebiopsy to confirm metastatic disease. He is not felt to be a cisplatin candidate but may have options for immunotherapy such as Keytruda versus Tecentriq based on previous evaluation.    CT  Done 01/29/2018 showed IMPRESSION: Status post cystoprostatectomy. No evidence of recurrent or metastatic disease.  Stable pulmonary nodules, including a dominant flat perifissural nodule along the right minor fissure, likely benign. Consider attention on follow-up as clinically warranted.  He will be set up for repeat CT CAP in 04/2018 for ongoing follow-up and monitoring.  He will RTC at that time to go over results.    2.  Request for Trazodone.  At his last appointment in December 2018 he was instructed to establish with PCP for ongoing management of sleeping aids.  He is reporting he has not yet established with a PCP.  He is referred today to establish with PCP ASAP.  3.  Dental procedures.  I discussed with him he is not on chemotherapy that would preclude procedures.  He however would need to establish with the PCP to address any medical questions regarding dental procedures.  4.  CKD.  Cr 1.59.  Potassium 3.8.  Follow-up with PCP or nephrology.    5.  Pulmonary nodule.  This was noted along right fissure.  He is a smoker and cessation is recommended.  He will be set up for repeat CT chest in May 2019 for follow-up.      Interval history:  72 y.o. male previously followed by [name] high-grade urothelial carcinoma bladder is status post neoadjuvant systemic chemotherapy followed by definitive surgical management with cystoprostatectomy by [name] on 07/30/2017. He has high-risk disease and is at high  risk for recurrence/relapse. He reports that his energy level is improving but is not back to baseline. He has not established with primary care provider and is working on  getting reestablished with a new primary care provider.  Current Status:  Pt is seen today for follow-up.  He is here to go over CT scans.  He is requesting Trazodone.      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 Correlate with tissue sampling on cystoscopy.  No findings suspicious for upper tract disease.  Cholelithiasis, without associated inflammatory changes.      11/19/2016 Procedure    TURBT by [name] of a 2 centimeter anterior bladder wall tumor, placement of epirubicin intravesical      11/19/2016 Procedure    50 milligrams of epirubicin and 50 mL of diluent.  This was left indwelling for 1 hour by [name]      11/22/2016 Pathology Results    Bladder, transurethral resection, bladder tumor INFILTRATING HIGH GRADE UROTHELIAL CARCINOMA THE CARCINOMA INVADES MUSCULARIS PROPRIA (DETRUSOR MUSCLE) Microscopic Comment The neoplasm stains positive for high molecular weight cytokeratin, p63 and negative for prostein. The immunostain pattern supports the diagnosis of urothelial carcinoma.      12/18/2016 Imaging    Bilateral interstitial prominence. Active pneumonitis cannot be excluded. These changes could be also be related chronic interstitial lung disease.      12/30/2016 - 04/28/2017 Chemotherapy    The patient had palonosetron (ALOXI) injection 0.25 mg, 0.25 mg, Intravenous,  Once, 4 of 4 cycles  CISplatin (PLATINOL) 138 mg in sodium chloride 0.9 % 500 mL chemo infusion, 70 mg/m2 = 138 mg, Intravenous,  Once, 4 of 4 cycles  gemcitabine (GEMZAR) 1,976 mg in sodium chloride 0.9 % 250 mL chemo infusion, 1,000 mg/m2 = 1,976 mg, Intravenous,  Once, 4 of 4 cycles Dose modification: 800 mg/m2 (80 % of original dose 1,000 mg/m2, Cycle 3, Reason: Provider Judgment, Comment:  Thrombocytopenia)  fosaprepitant (EMEND) 150 mg, dexamethasone (DECADRON) 12 mg in sodium chloride 0.9 % 145 mL IVPB, , Intravenous,  Once, 4 of 4 cycles  for chemotherapy treatment.        02/17/2017 Treatment Plan Change    Treatment deferred x 7 days due to thrombocytopenia      02/17/2017 Treatment Plan Change    Gemcitabine dose reduced by 20% due to thrombocytopenia      03/10/2017 Treatment Plan Change    Cisplatin dose-reduced by 20%      04/28/2017 Treatment Plan Change    Day 15 of cycle #4 is cancelled due to thrombocytopenia (68,000).      05/02/2017 Imaging    CT abd/pelvis at [facility]-no evidence of metastatic disease or recurrent focal urothelial lesion on noncontrast imaging.  There is mild diffuse bladder wall thickening.  Urothelial evaluation limited without contrast.  Cholelithiasis, sigmoid diverticulosis, aortic atherosclerosis.      07/30/2017 Definitive Surgery    Cystoprostatectomy by [name] with curative intent.      07/30/2017 Pathology Results    Specimen: Bladder and prostate Procedure: Cystoprostatectomy Tumor site (if known): anterior wall Maximum tumor size (cm): 0.4 cm Histology: urothelial carcinoma Grade: high grade Microscopic tumor extension: deeper muscularis propria Lymph - Vascular invasion: Negative Involvement of adjacent organs/structures: Negative Additional epithelial lesions: Carcinoma in situ Margins: negative Lymph nodes: number examined 15 ; number  positive 0 TNM code: ypT2b, ypN 0,      07/30/2017 Cancer Staging    Cancer Staging Urothelial carcinoma of bladder [facility]) Staging form: Urinary Bladder, AJCC 8th Edition - Clinical stage from 12/05/2016: Stage IIIA (cT3, cN0, cM0) - Signed by [name], [name] on 12/18/2016 - Pathologic stage from 12/10/2016: pT2, pN0, cM0 - Signed by [name], [name] on 12/10/2016 - Pathologic stage from 07/30/2017: Stage II (ypT2b, pN0, cM0) - Signed by [name], NP  on 09/09/2017       11/26/2017 Imaging    CT abd./pelvis- 1. Several mildly enlarged lymph nodes are present including a 1.2 cm peripancreatic lymph node and a 1.4 cm low-density left external iliac lymph node or postoperative fluid collection. These merit surveillance. 2. A right middle lobe subpleural nodule along the minor fissure as a volume of 100 cubic mm. This level has not been included in prior cross-sectional imaging to assess for stability. Guidelines for follow up do not specifically apply due to history of malignancy. Well likely a benign subpleural lymph node, surveillance is likely warranted. 3. Other imaging findings of potential clinical significance: Aortic Atherosclerosis (ICD10-I70.0) and Emphysema (ICD10-J43.9). Coronary atherosclerosis with mitral and aortic valve calcification. Borderline appearance for distal esophageal wall thickening, query reflux. Several small left lower lobe nodules in the 3-4 mm range are probably inflammatory. Cholelithiasis. Cystoprostatectomy with loop ileostomy. Sigmoid diverticulosis. Notable common and external iliac stenosis bilaterally due to atherosclerosis. Lumbar impingement at L2- 3, L3-4, and L4-5.        Problem List Patient Active Problem List   Diagnosis Date Noted  . Chronic renal disease, stage 3, moderately decreased glomerular filtration rate (GFR) between 30-59 mL/min/1.73 square meter ([place]) [N18.3] 11/28/2017  . 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 Bladder cancer ([place]) [C67.9] 07/29/2017  . Tobacco abuse [Z72.0] 04/15/2017  . Other insomnia [G47.09] 04/15/2017  . [name] [F48.9] 04/15/2017  . Urothelial carcinoma of bladder ([place]) [C67.9] 12/10/2016    Past Medical History Past Medical History:  Diagnosis Date  . Arthritis   . Chronic renal disease, stage 3, moderately decreased glomerular filtration rate (GFR) between 30-59 mL/min/1.73 square meter ([facility]) 11/28/2017  . History of kidney stones    . Hypercholesteremia   . Urothelial carcinoma ([place]) 12/10/2016  . Urothelial carcinoma of bladder ([place]) 12/10/2016    Past Surgical History Past Surgical History:  Procedure Laterality Date  . CARDIAC CATHETERIZATION    . CYSTOSCOPY WITH INJECTION N/A 07/30/2017   Procedure: CYSTOSCOPY WITH INJECTION OF INDOCYANINE GREEN DYE;  Surgeon: Alexis Frock, MD;  Location: [facility];  Service: Urology;  Laterality: N/A;  . open reduction and internal fixation leg Right    hip and leg.  Marland Kitchen PORTACATH PLACEMENT N/A 12/23/2016   Procedure: PLACEMENT OF TUNNELED CENTRAL VENOUS CATHETER RIGHT INTERNAL JUGULAR WITH SUBCUTANEOUS PORT;  Surgeon: Vickie Epley, MD;  Location: [facility];  Service: Vascular;  Laterality: N/A;  . reattatchment of left arm     from [place]  . TRANSURETHRAL RESECTION OF BLADDER TUMOR N/A 11/19/2016   Procedure: TRANSURETHRAL RESECTION OF BLADDER TUMOR (TURBT) WITH EPIRUBICIN INJECTION;  Surgeon: Franchot Gallo, MD;  Location: [facility];  Service: Urology;  Laterality: N/A;    Family History Family History  Problem Relation Age of Onset  . Aneurysm Mother   . Diabetes Father   . Colon cancer Brother      Social History  reports that he has been smoking cigarettes.  He has a  100.00 pack-year smoking history. He has never used smokeless tobacco. He reports that he does not drink alcohol or use drugs.  Medications  Current Outpatient Medications:  .  ondansetron (ZOFRAN) 8 MG tablet, Take 1 tablet (8 mg total) by mouth every 8 (eight) hours as needed for nausea or vomiting., Disp: 30 tablet, Rfl: 2 .  OxyCODONE ER (XTAMPZA ER) 13.5 MG C12A, Xtampza ER 13.5 mg capsule sprinkle  1 BID Fill 08/28/2017, Disp: , Rfl:  .  Oxycodone HCl 10 MG TABS, Take 1-2 tablets (10-20 mg total) by mouth every 4 (four) hours as needed (pain). Post-operatively, Disp: 40 tablet, Rfl: 0 .  traZODone (DESYREL) 100 MG tablet, Take 1 tablet (100 mg total) by mouth at bedtime., Disp: 30 tablet, Rfl:  0  Allergies Penicillins; Ciprofloxacin; and Ambien [zolpidem tartrate]  Review of Systems Review of Systems - Oncology ROS as per HPI otherwise 12 point ROS is negative.   Physical Exam  Vitals Wt Readings from Last 3 Encounters:  04/01/18 162 lb 3.2 oz (73.6 kg)  11/28/17 159 lb 8 oz (72.3 kg)  09/09/17 158 lb (71.7 kg)   Temp Readings from Last 3 Encounters:  04/01/18 98.2 F (36.8 C) (Oral)  11/28/17 98.4 F (36.9 C) (Oral)  08/05/17 98.3 F (36.8 C) (Oral)   BP Readings from Last 3 Encounters:  04/01/18 (!) 121/48  11/28/17 129/63  09/09/17 134/68   Pulse Readings from Last 3 Encounters:  04/01/18 88  11/28/17 (!) 104  09/09/17 81   Constitutional: Well-developed, well-nourished, and in no distress.   HENT: Head: Normocephalic and atraumatic.  Mouth/Throat: No oropharyngeal exudate. Mucosa moist. Eyes: Pupils are equal, round, and reactive to light. Conjunctivae are normal. No scleral icterus.  Neck: Normal range of motion. Neck supple. No JVD present.  Cardiovascular: Normal rate, regular rhythm and normal heart sounds.  Exam reveals no gallop and no friction rub.   No murmur heard. Pulmonary/Chest: Effort normal and breath sounds normal. No respiratory distress. No wheezes.No rales.  Abdominal: Soft. Bowel sounds are normal. No distension. There is no tenderness. There is no guarding.  Musculoskeletal: No edema or tenderness.  Lymphadenopathy: No cervical, axillary or supraclavicular adenopathy.  Neurological: Alert and oriented to person, place, and time. No cranial nerve deficit.  Skin: Skin is warm and dry. No rash noted. No erythema. No pallor.  Psychiatric: Affect and judgment normal.   Labs No visits with results within 3 Day(s) from this visit.  Latest known visit with results is:  Appointment on 01/22/2018  Component Date Value Ref Range Status  . Magnesium 01/22/2018 1.8  1.7 - 2.4 mg/dL Final   Performed at [facility], [address],  [place], [place] [address]  . Sodium 01/22/2018 134* 135 - 145 mmol/L Final  . Potassium 01/22/2018 3.8  3.5 - 5.1 mmol/L Final  . Chloride 01/22/2018 104  101 - 111 mmol/L Final  . CO2 01/22/2018 19* 22 - 32 mmol/L Final  . Glucose, Bld 01/22/2018 132* 65 - 99 mg/dL Final  . BUN 01/22/2018 30* 6 - 20 mg/dL Final  . Creatinine, Ser 01/22/2018 1.59* 0.61 - 1.24 mg/dL Final  . Calcium 01/22/2018 9.3  8.9 - 10.3 mg/dL Final  . Total Protein 01/22/2018 7.6  6.5 - 8.1 g/dL Final  . Albumin 01/22/2018 4.1  3.5 - 5.0 g/dL Final  . AST 01/22/2018 25  15 - 41 U/L Final  . ALT 01/22/2018 13* 17 - 63 U/L Final  . Alkaline Phosphatase 01/22/2018  105  38 - 126 U/L Final  . Total Bilirubin 01/22/2018 0.3  0.3 - 1.2 mg/dL Final  . GFR calc non Af Amer 01/22/2018 42* >60 mL/min Final  . GFR calc Af Amer 01/22/2018 48* >60 mL/min Final   Comment: (NOTE) The eGFR has been calculated using the CKD EPI equation. This calculation has not been validated in all clinical situations. eGFR's persistently <60 mL/min signify possible Chronic Kidney Disease.   Georgiann Hahn gap 01/22/2018 11  5 - 15 Final   Performed at [facility], [address], [place], [place] [address]  . WBC 01/22/2018 13.0* 4.0 - 10.5 K/uL Final  . RBC 01/22/2018 4.09* 4.22 - 5.81 MIL/uL Final  . Hemoglobin 01/22/2018 12.1* 13.0 - 17.0 g/dL Final  . HCT 01/22/2018 37.2* 39.0 - 52.0 % Final  . MCV 01/22/2018 91.0  78.0 - 100.0 fL Final  . MCH 01/22/2018 29.6  26.0 - 34.0 pg Final  . MCHC 01/22/2018 32.5  30.0 - 36.0 g/dL Final  . RDW 01/22/2018 14.8  11.5 - 15.5 % Final  . Platelets 01/22/2018 232  150 - 400 K/uL Final  . Neutrophils Relative % 01/22/2018 77  % Final  . Neutro Abs 01/22/2018 9.9* 1.7 - 7.7 K/uL Final  . Lymphocytes Relative 01/22/2018 17  % Final  . Lymphs Abs 01/22/2018 2.2  0.7 - 4.0 K/uL Final  . Monocytes Relative 01/22/2018 5  % Final  . Monocytes Absolute 01/22/2018 0.7  0.1 - 1.0 K/uL Final  . Eosinophils Relative  01/22/2018 1  % Final  . Eosinophils Absolute 01/22/2018 0.1  0.0 - 0.7 K/uL Final  . Basophils Relative 01/22/2018 0  % Final  . Basophils Absolute 01/22/2018 0.0  0.0 - 0.1 K/uL Final   Performed at [facility], [address], [place], [place] [address]     Pathology Orders Placed This Encounter  Procedures  . CT Chest W Contrast    Standing Status:   Future    Standing Expiration Date:   04/01/2019    Order Specific Question:   If indicated for the ordered procedure, I authorize the administration of contrast media per Radiology protocol    Answer:   Yes    Order Specific Question:   Preferred imaging location?    Answer:   [facility]    Order Specific Question:   Radiology Contrast Protocol - do NOT remove file path    Answer:   [email or web address]  . CT Abdomen Pelvis W Contrast    Standing Status:   Future    Standing Expiration Date:   04/01/2019    Order Specific Question:   If indicated for the ordered procedure, I authorize the administration of contrast media per Radiology protocol    Answer:   Yes    Order Specific Question:   Preferred imaging location?    Answer:   [facility]    Order Specific Question:   Is Oral Contrast requested for this exam?    Answer:   Per Radiology protocol    Order Specific Question:   Radiology Contrast Protocol - do NOT remove file path    Answer:   [email or web address]  . CBC with Differential/Platelet    Standing Status:   Future    Standing Expiration Date:   04/02/2019  . Comprehensive metabolic panel    Standing Status:   Future    Standing Expiration Date:   04/02/2019  . Lactate dehydrogenase    Standing Status:  Future    Standing Expiration Date:   04/02/2019       Zoila Shutter MD

## 2018-04-20 ENCOUNTER — Ambulatory Visit (HOSPITAL_COMMUNITY)
Admission: RE | Admit: 2018-04-20 | Discharge: 2018-04-20 | Disposition: A | Discharge status: Home / Self Care | Payer: Medicare HMO | Source: Ambulatory Visit | Attending: Internal Medicine | Admitting: Internal Medicine

## 2018-04-20 ENCOUNTER — Inpatient Hospital Stay (HOSPITAL_COMMUNITY): Payer: Medicare HMO | Attending: Hematology

## 2018-04-20 DIAGNOSIS — C679 Malignant neoplasm of bladder, unspecified: Secondary | ICD-10-CM | POA: Diagnosis present

## 2018-04-20 DIAGNOSIS — Z9221 Personal history of antineoplastic chemotherapy: Secondary | ICD-10-CM | POA: Insufficient documentation

## 2018-04-20 DIAGNOSIS — K229 Disease of esophagus, unspecified: Secondary | ICD-10-CM | POA: Diagnosis not present

## 2018-04-20 DIAGNOSIS — Z79899 Other long term (current) drug therapy: Secondary | ICD-10-CM | POA: Diagnosis not present

## 2018-04-20 DIAGNOSIS — M5136 Other intervertebral disc degeneration, lumbar region: Secondary | ICD-10-CM | POA: Diagnosis not present

## 2018-04-20 DIAGNOSIS — C678 Malignant neoplasm of overlapping sites of bladder: Secondary | ICD-10-CM

## 2018-04-20 DIAGNOSIS — F17210 Nicotine dependence, cigarettes, uncomplicated: Secondary | ICD-10-CM | POA: Diagnosis not present

## 2018-04-20 DIAGNOSIS — J439 Emphysema, unspecified: Secondary | ICD-10-CM | POA: Insufficient documentation

## 2018-04-20 DIAGNOSIS — R911 Solitary pulmonary nodule: Secondary | ICD-10-CM | POA: Insufficient documentation

## 2018-04-20 DIAGNOSIS — N183 Chronic kidney disease, stage 3 (moderate): Secondary | ICD-10-CM | POA: Diagnosis not present

## 2018-04-20 DIAGNOSIS — M47816 Spondylosis without myelopathy or radiculopathy, lumbar region: Secondary | ICD-10-CM | POA: Diagnosis not present

## 2018-04-20 DIAGNOSIS — I700 Atherosclerosis of aorta: Secondary | ICD-10-CM | POA: Insufficient documentation

## 2018-04-20 DIAGNOSIS — Z9079 Acquired absence of other genital organ(s): Secondary | ICD-10-CM | POA: Diagnosis not present

## 2018-04-20 DIAGNOSIS — Z800 Family history of malignant neoplasm of digestive organs: Secondary | ICD-10-CM | POA: Insufficient documentation

## 2018-04-20 DIAGNOSIS — I2510 Atherosclerotic heart disease of native coronary artery without angina pectoris: Secondary | ICD-10-CM | POA: Diagnosis not present

## 2018-04-20 DIAGNOSIS — K5730 Diverticulosis of large intestine without perforation or abscess without bleeding: Secondary | ICD-10-CM | POA: Insufficient documentation

## 2018-04-20 DIAGNOSIS — K8020 Calculus of gallbladder without cholecystitis without obstruction: Secondary | ICD-10-CM | POA: Diagnosis not present

## 2018-04-20 LAB — COMPREHENSIVE METABOLIC PANEL
ALT: 10 U/L — ABNORMAL LOW (ref 17–63)
ANION GAP: 6 (ref 5–15)
AST: 13 U/L — ABNORMAL LOW (ref 15–41)
Albumin: 3.8 g/dL (ref 3.5–5.0)
Alkaline Phosphatase: 93 U/L (ref 38–126)
BUN: 24 mg/dL — ABNORMAL HIGH (ref 6–20)
CALCIUM: 8.9 mg/dL (ref 8.9–10.3)
CHLORIDE: 109 mmol/L (ref 101–111)
CO2: 22 mmol/L (ref 22–32)
Creatinine, Ser: 1.54 mg/dL — ABNORMAL HIGH (ref 0.61–1.24)
GFR calc non Af Amer: 43 mL/min — ABNORMAL LOW (ref >60)
GFR, EST AFRICAN AMERICAN: 50 mL/min — AB (ref >60)
Glucose, Bld: 91 mg/dL (ref 65–99)
Potassium: 4.4 mmol/L (ref 3.5–5.1)
SODIUM: 137 mmol/L (ref 135–145)
Total Bilirubin: 0.4 mg/dL (ref 0.3–1.2)
Total Protein: 6.9 g/dL (ref 6.5–8.1)

## 2018-04-20 LAB — CBC WITH DIFFERENTIAL/PLATELET
Basophils Absolute: 0 10*3/uL (ref 0.0–0.1)
Basophils Relative: 0 %
EOS ABS: 0.4 10*3/uL (ref 0.0–0.7)
EOS PCT: 4 %
HCT: 37.7 % — ABNORMAL LOW (ref 39.0–52.0)
Hemoglobin: 12.3 g/dL — ABNORMAL LOW (ref 13.0–17.0)
LYMPHS ABS: 1.9 10*3/uL (ref 0.7–4.0)
Lymphocytes Relative: 19 %
MCH: 30.5 pg (ref 26.0–34.0)
MCHC: 32.6 g/dL (ref 30.0–36.0)
MCV: 93.5 fL (ref 78.0–100.0)
MONOS PCT: 5 %
Monocytes Absolute: 0.5 10*3/uL (ref 0.1–1.0)
Neutro Abs: 7.5 10*3/uL (ref 1.7–7.7)
Neutrophils Relative %: 72 %
PLATELETS: 232 10*3/uL (ref 150–400)
RBC: 4.03 MIL/uL — ABNORMAL LOW (ref 4.22–5.81)
RDW: 16.1 % — AB (ref 11.5–15.5)
WBC: 10.3 10*3/uL (ref 4.0–10.5)

## 2018-04-20 LAB — POCT I-STAT CREATININE: Creatinine, Ser: 1.5 mg/dL — ABNORMAL HIGH (ref 0.61–1.24)

## 2018-04-20 LAB — LACTATE DEHYDROGENASE: LDH: 136 U/L (ref 98–192)

## 2018-04-20 MED ORDER — IOPAMIDOL (ISOVUE-300) INJECTION 61%
80.0000 mL | Freq: Once | INTRAVENOUS | Status: AC | PRN
  Administered 2018-04-20: 80 mL via INTRAVENOUS

## 2018-04-22 ENCOUNTER — Encounter (HOSPITAL_COMMUNITY): Payer: Self-pay | Admitting: Internal Medicine

## 2018-04-22 ENCOUNTER — Other Ambulatory Visit: Payer: Self-pay

## 2018-04-22 ENCOUNTER — Inpatient Hospital Stay (HOSPITAL_BASED_OUTPATIENT_CLINIC_OR_DEPARTMENT_OTHER): Payer: Medicare HMO | Admitting: Internal Medicine

## 2018-04-22 VITALS — BP 164/72 | HR 92 | Temp 98.2°F | Resp 20 | Wt 159.1 lb

## 2018-04-22 DIAGNOSIS — Z800 Family history of malignant neoplasm of digestive organs: Secondary | ICD-10-CM

## 2018-04-22 DIAGNOSIS — C679 Malignant neoplasm of bladder, unspecified: Secondary | ICD-10-CM | POA: Diagnosis not present

## 2018-04-22 DIAGNOSIS — Z9221 Personal history of antineoplastic chemotherapy: Secondary | ICD-10-CM

## 2018-04-22 DIAGNOSIS — N183 Chronic kidney disease, stage 3 (moderate): Secondary | ICD-10-CM

## 2018-04-22 DIAGNOSIS — Z79899 Other long term (current) drug therapy: Secondary | ICD-10-CM

## 2018-04-22 DIAGNOSIS — R911 Solitary pulmonary nodule: Secondary | ICD-10-CM

## 2018-04-22 DIAGNOSIS — F17210 Nicotine dependence, cigarettes, uncomplicated: Secondary | ICD-10-CM

## 2018-04-22 NOTE — Progress Notes (Signed)
Diagnosis Solitary pulmonary nodule - Plan: CT CHEST W CONTRAST, CT Abdomen Pelvis W Contrast, CBC with Differential/Platelet, Comprehensive metabolic panel, Lactate dehydrogenase  Urothelial carcinoma of bladder ([facility]) - Plan: CT CHEST W CONTRAST, CT Abdomen Pelvis W Contrast, CBC with Differential/Platelet, Comprehensive metabolic panel, Lactate dehydrogenase  Staging Cancer Staging Urothelial carcinoma of bladder ([place]) Staging form: Urinary Bladder, AJCC 8th Edition - Clinical stage from 12/05/2016: Stage IIIA (cT3, cN0, cM0) - Signed by [name], [name] on 12/18/2016 - Pathologic stage from 12/10/2016: pT2, pN0, cM0 - Signed by [name], [name] on 12/10/2016 - Pathologic stage from 07/30/2017: Stage II (ypT2b, pN0, cM0) - Signed by [name], NP on 09/09/2017   Assessment and Plan:   1.  Urothelial cancer of bladder.  Pt  Was previously followed by [name].  He was diagnosed with Invasive urothelial carcinoma (pT2N0) with TURBT by [name] on 11/19/2016 and epirubicin intra-bladder treatment BUT with concerns for clinically early T3 disease.  He is S/P systemic chemotherapy consisting of Cisplatin/gemcitabinex 4 cycles (12/30/2016- 04/21/2017).  He then underwent definitive surgical management with cystoprostatectomy by 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  Post-surgery restaging scans on 11/26/2017 revealed several mildly enlarged lymph nodes (1.2 cm peripancreatic lymph node and 1.4 cm left external iliac lymph node versus post-operative fluid collection), RML subpleural nodule along the minor fissure.  He was set up for CT scan due to lymphadenopathy identified on CT imaging in December 2018. This is concerning for  recurrence/relapse of disease. If lymph nodes remain enlarged and/or progress, restaging PET scan may be needed plus or minus rebiopsy to confirm metastatic disease. He is not felt to be a cisplatin candidate but may have options for immunotherapy such as Keytruda versus Tecentriq based on previous evaluation.    CT CAP done 04/20/2018 shows  IMPRESSION: 1. No findings of active malignancy. Prior cystoprostatectomy with ileal conduit without complicating feature. The perifissural nodule along the minor fissure measures 6 mm in thickness, reduced from prior exams, probably a benign lymph node. 2. There is a borderline prominent but stable peripancreatic lymph node. Right infrahilar lymph node is upper normal in size. 3. Diffuse mild esophageal wall thickening could reflect esophagitis. 4. Prominent atherosclerotic celiac trunk narrowing proximally with reconstitution from the SMA. 5. Other imaging findings of potential clinical significance: Aortic Atherosclerosis (ICD10-I70.0) and Emphysema (ICD10-J43.9). Coronary atherosclerosis. Cholelithiasis. Sigmoid diverticulosis. Healed pelvic fractures. Lumbar spondylosis and degenerative disc disease with multilevel impingement.     The pt will be set up for repeat imaging in November 2019.  He will have repeat labs at that time and will  follow-up to go over lab and scan results.    Labs and scans reviewed with pt.    2.  PAC questions.  He reports PAC is sore and he desires to have it removed.  Will be referred back to Dr. [name] or [name] of surgery for [facility] removal.  Last chemotherapy was 04/2017.    3.  CKD.  Cr 1.54.  Potassium 4.4.  Will repeat labs on RTC.  May refer to nephrology if numbers change.    4.  Pulmonary nodule.  This was noted along right fissure.  He is a smoker and cessation is recommended. CT of chest done 04/20/2018 showed  Impression: Centrilobular emphysema. Perifissural nodule primarily along the upper lobe side of  the minor fissure, approximately 6 mm in thickness,, slightly less than on prior exams including 11/26/2017. Faint scattered nodularity along the periphery of both lungs as before, likely residua from prior atypical infectious Bronchiolitis.  He will be set up for repeat imaging in 10/2018 and will follow-up to go over results.  He reports he is cutting down on smoking.      Interval history:  72 y.o. male previously followed by [name] high-grade urothelial carcinoma bladder is status post neoadjuvant systemic chemotherapy followed by definitive surgical management with cystoprostatectomy by [name] on 07/30/2017. He has high-risk disease and is at high risk for recurrence/relapse. He reports that his energy level is improving but is not back to baseline. He has not established with primary care provider and is working on getting reestablished with a new primary care provider.  Current Status:  Pt is seen today for follow-up.  He is here to go over CT scans.  He     Urothelial carcinoma of bladder ([place])   11/15/2016 Imaging    CT abd/pelvis- Enhancing soft tissue lesion along the left bladder base, measuring approximately 3.2 x 2.7 cm, worrisome for primary bladder neoplasm. Correlate with tissue sampling on cystoscopy.  No findings suspicious for upper tract disease.  Cholelithiasis, without associated inflammatory changes.      11/19/2016 Procedure    TURBT by [name] of a 2 centimeter anterior bladder wall tumor, placement of epirubicin intravesical      11/19/2016 Procedure    50 milligrams of epirubicin and 50 mL of diluent.  This was left indwelling for 1 hour by [name]      11/22/2016 Pathology Results    Bladder, transurethral resection, bladder tumor INFILTRATING HIGH GRADE UROTHELIAL CARCINOMA THE CARCINOMA INVADES MUSCULARIS PROPRIA (DETRUSOR MUSCLE) Microscopic Comment The neoplasm stains positive for high molecular weight cytokeratin, p63 and  negative for prostein. The immunostain pattern supports the diagnosis of urothelial carcinoma.      12/18/2016 Imaging    Bilateral interstitial prominence. Active pneumonitis cannot be excluded. These changes could be also be related chronic interstitial lung disease.      12/30/2016 - 04/28/2017 Chemotherapy    The patient had palonosetron (ALOXI) injection 0.25 mg, 0.25 mg, Intravenous,  Once, 4 of 4 cycles  CISplatin (PLATINOL) 138 mg in sodium chloride 0.9 % 500 mL chemo infusion, 70 mg/m2 = 138 mg, Intravenous,  Once, 4 of 4 cycles  gemcitabine (GEMZAR) 1,976 mg in sodium chloride 0.9 % 250 mL chemo infusion, 1,000 mg/m2 = 1,976 mg, Intravenous,  Once, 4 of 4 cycles Dose modification: 800 mg/m2 (80 % of original dose 1,000 mg/m2, Cycle 3, Reason: Provider Judgment, Comment: Thrombocytopenia)  fosaprepitant (EMEND) 150 mg, dexamethasone (DECADRON) 12 mg in sodium chloride 0.9 % 145 mL IVPB, , Intravenous,  Once, 4 of 4 cycles  for chemotherapy treatment.        02/17/2017 Treatment Plan Change    Treatment deferred x 7 days due to thrombocytopenia      02/17/2017 Treatment Plan Change    Gemcitabine dose reduced by 20% due to thrombocytopenia      03/10/2017 Treatment Plan Change    Cisplatin dose-reduced by 20%      04/28/2017 Treatment Plan Change    Day 15 of cycle #4 is cancelled due to thrombocytopenia (68,000).      05/02/2017 Imaging    CT abd/pelvis at [facility]-no evidence  of metastatic disease or recurrent focal urothelial lesion on noncontrast imaging.  There is mild diffuse bladder wall thickening.  Urothelial evaluation limited without contrast.  Cholelithiasis, sigmoid diverticulosis, aortic atherosclerosis.      07/30/2017 Definitive Surgery    Cystoprostatectomy by [name] with curative intent.      07/30/2017 Pathology Results    Specimen: Bladder and prostate Procedure: Cystoprostatectomy Tumor site (if known): anterior wall Maximum tumor size  (cm): 0.4 cm Histology: urothelial carcinoma Grade: high grade Microscopic tumor extension: deeper muscularis propria Lymph - Vascular invasion: Negative Involvement of adjacent organs/structures: Negative Additional epithelial lesions: Carcinoma in situ Margins: negative Lymph nodes: number examined 15 ; number positive 0 TNM code: ypT2b, ypN 0,      07/30/2017 Cancer Staging    Cancer Staging Urothelial carcinoma of bladder ([place]) Staging form: Urinary Bladder, AJCC 8th Edition - Clinical stage from 12/05/2016: Stage IIIA (cT3, cN0, cM0) - Signed by [name], [name] on 12/18/2016 - Pathologic stage from 12/10/2016: pT2, pN0, cM0 - Signed by [name], [name] on 12/10/2016 - Pathologic stage from 07/30/2017: Stage II (ypT2b, pN0, cM0) - Signed by [name], NP on 09/09/2017       11/26/2017 Imaging    CT abd./pelvis- 1. Several mildly enlarged lymph nodes are present including a 1.2 cm peripancreatic lymph node and a 1.4 cm low-density left external iliac lymph node or postoperative fluid collection. These merit surveillance. 2. A right middle lobe subpleural nodule along the minor fissure as a volume of 100 cubic mm. This level has not been included in prior cross-sectional imaging to assess for stability. Guidelines for follow up do not specifically apply due to history of malignancy. Well likely a benign subpleural lymph node, surveillance is likely warranted. 3. Other imaging findings of potential clinical significance: Aortic Atherosclerosis (ICD10-I70.0) and Emphysema (ICD10-J43.9). Coronary atherosclerosis with mitral and aortic valve calcification. Borderline appearance for distal esophageal wall thickening, query reflux. Several small left lower lobe nodules in the 3-4 mm range are probably inflammatory. Cholelithiasis. Cystoprostatectomy with loop ileostomy. Sigmoid diverticulosis. Notable common and external iliac stenosis bilaterally due to  atherosclerosis. Lumbar impingement at L2- 3, L3-4, and L4-5.        Problem List Patient Active Problem List   Diagnosis Date Noted  . Chronic renal disease, stage 3, moderately decreased glomerular filtration rate (GFR) between 30-59 mL/min/1.73 square meter ([place]) [N18.3] 11/28/2017  . 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 Bladder cancer ([place]) [C67.9] 07/29/2017  . Tobacco abuse [Z72.0] 04/15/2017  . Other insomnia [G47.09] 04/15/2017  . [name] [F48.9] 04/15/2017  . Urothelial carcinoma of bladder ([place]) [C67.9] 12/10/2016    Past Medical History Past Medical History:  Diagnosis Date  . Arthritis   . Chronic renal disease, stage 3, moderately decreased glomerular filtration rate (GFR) between 30-59 mL/min/1.73 square meter ([facility]) 11/28/2017  . History of kidney stones   . Hypercholesteremia   . Urothelial carcinoma ([place]) 12/10/2016  . Urothelial carcinoma of bladder ([place]) 12/10/2016    Past Surgical History Past Surgical History:  Procedure Laterality Date  . CARDIAC CATHETERIZATION    . CYSTOSCOPY WITH INJECTION N/A 07/30/2017   Procedure: CYSTOSCOPY WITH INJECTION OF INDOCYANINE GREEN DYE;  Surgeon: Alexis Frock, MD;  Location: [facility];  Service: Urology;  Laterality: N/A;  . open reduction and internal fixation leg Right    hip and leg.  Marland Kitchen PORTACATH PLACEMENT N/A 12/23/2016   Procedure: PLACEMENT OF TUNNELED CENTRAL VENOUS CATHETER RIGHT INTERNAL JUGULAR  WITH SUBCUTANEOUS PORT;  Surgeon: Vickie Epley, MD;  Location: [facility];  Service: Vascular;  Laterality: N/A;  . reattatchment of left arm     from [place]  . TRANSURETHRAL RESECTION OF BLADDER TUMOR N/A 11/19/2016   Procedure: TRANSURETHRAL RESECTION OF BLADDER TUMOR (TURBT) WITH EPIRUBICIN INJECTION;  Surgeon: Franchot Gallo, MD;  Location: [facility];  Service: Urology;  Laterality: N/A;    Family History Family History  Problem Relation Age of Onset  . Aneurysm Mother   . Diabetes Father   . Colon  cancer Brother      Social History  reports that he has been smoking cigarettes.  He has a 100.00 pack-year smoking history. He has never used smokeless tobacco. He reports that he does not drink alcohol or use drugs.  Medications  Current Outpatient Medications:  .  ondansetron (ZOFRAN) 8 MG tablet, Take 1 tablet (8 mg total) by mouth every 8 (eight) hours as needed for nausea or vomiting., Disp: 30 tablet, Rfl: 2 .  OxyCODONE ER (XTAMPZA ER) 13.5 MG C12A, Xtampza ER 13.5 mg capsule sprinkle  1 BID Fill 08/28/2017, Disp: , Rfl:  .  Oxycodone HCl 10 MG TABS, Take 1-2 tablets (10-20 mg total) by mouth every 4 (four) hours as needed (pain). Post-operatively, Disp: 40 tablet, Rfl: 0 .  traZODone (DESYREL) 100 MG tablet, Take 1 tablet (100 mg total) by mouth at bedtime., Disp: 30 tablet, Rfl: 0  Allergies Penicillins; Ciprofloxacin; and Ambien [zolpidem tartrate]  Review of Systems Review of Systems - Oncology ROS as per HPI otherwise 12 point ROS is negative.   Physical Exam  Vitals Wt Readings from Last 3 Encounters:  04/22/18 159 lb 1.6 oz (72.2 kg)  04/01/18 162 lb 3.2 oz (73.6 kg)  11/28/17 159 lb 8 oz (72.3 kg)   Temp Readings from Last 3 Encounters:  04/22/18 98.2 F (36.8 C) (Oral)  04/01/18 98.2 F (36.8 C) (Oral)  11/28/17 98.4 F (36.9 C) (Oral)   BP Readings from Last 3 Encounters:  04/22/18 (!) 164/72  04/01/18 (!) 121/48  11/28/17 129/63   Pulse Readings from Last 3 Encounters:  04/22/18 92  04/01/18 88  11/28/17 (!) 104    Constitutional: Well-developed, well-nourished, and in no distress.   HENT: Head: Normocephalic and atraumatic.  Mouth/Throat: No oropharyngeal exudate. Mucosa moist. Eyes: Pupils are equal, round, and reactive to light. Conjunctivae are normal. No scleral icterus.  Neck: Normal range of motion. Neck supple. No JVD present.  Cardiovascular: Normal rate, regular rhythm and normal heart sounds.  Exam reveals no gallop and no friction  rub.   No murmur heard. Pulmonary/Chest: Effort normal and breath sounds normal. No respiratory distress. No wheezes.No rales.  Abdominal: Soft. Bowel sounds are normal. No distension. There is no tenderness. There is no guarding. Colostomy on right.   Musculoskeletal: No edema or tenderness.  Lymphadenopathy: No cervical, axillary or supraclavicular adenopathy.  Neurological: Alert and oriented to person, place, and time. No cranial nerve deficit.  Skin: Skin is warm and dry. No rash noted. No erythema. No pallor.  Psychiatric: Affect and judgment normal.   [facility] Outpatient Visit on 04/20/2018  Component Date Value Ref Range Status  . Creatinine, Ser 04/20/2018 1.50* 0.61 - 1.24 mg/dL Final  Appointment on 04/20/2018  Component Date Value Ref Range Status  . WBC 04/20/2018 10.3  4.0 - 10.5 K/uL Final  . RBC 04/20/2018 4.03* 4.22 - 5.81 MIL/uL Final  . Hemoglobin 04/20/2018 12.3* 13.0 - 17.0  g/dL Final  . HCT 04/20/2018 37.7* 39.0 - 52.0 % Final  . MCV 04/20/2018 93.5  78.0 - 100.0 fL Final  . MCH 04/20/2018 30.5  26.0 - 34.0 pg Final  . MCHC 04/20/2018 32.6  30.0 - 36.0 g/dL Final  . RDW 04/20/2018 16.1* 11.5 - 15.5 % Final  . Platelets 04/20/2018 232  150 - 400 K/uL Final  . Neutrophils Relative % 04/20/2018 72  % Final  . Neutro Abs 04/20/2018 7.5  1.7 - 7.7 K/uL Final  . Lymphocytes Relative 04/20/2018 19  % Final  . Lymphs Abs 04/20/2018 1.9  0.7 - 4.0 K/uL Final  . Monocytes Relative 04/20/2018 5  % Final  . Monocytes Absolute 04/20/2018 0.5  0.1 - 1.0 K/uL Final  . Eosinophils Relative 04/20/2018 4  % Final  . Eosinophils Absolute 04/20/2018 0.4  0.0 - 0.7 K/uL Final  . Basophils Relative 04/20/2018 0  % Final  . Basophils Absolute 04/20/2018 0.0  0.0 - 0.1 K/uL Final   Performed at [facility], [address] [place], [place] [address]  . Sodium 04/20/2018 137  135 - 145 mmol/L Final  . Potassium 04/20/2018 4.4  3.5 - 5.1 mmol/L Final  . Chloride 04/20/2018 109   101 - 111 mmol/L Final  . CO2 04/20/2018 22  22 - 32 mmol/L Final  . Glucose, Bld 04/20/2018 91  65 - 99 mg/dL Final  . BUN 04/20/2018 24* 6 - 20 mg/dL Final  . Creatinine, Ser 04/20/2018 1.54* 0.61 - 1.24 mg/dL Final  . Calcium 04/20/2018 8.9  8.9 - 10.3 mg/dL Final  . Total Protein 04/20/2018 6.9  6.5 - 8.1 g/dL Final  . Albumin 04/20/2018 3.8  3.5 - 5.0 g/dL Final  . AST 04/20/2018 13* 15 - 41 U/L Final  . ALT 04/20/2018 10* 17 - 63 U/L Final  . Alkaline Phosphatase 04/20/2018 93  38 - 126 U/L Final  . Total Bilirubin 04/20/2018 0.4  0.3 - 1.2 mg/dL Final  . GFR calc non Af Amer 04/20/2018 43* >60 mL/min Final  . GFR calc Af Amer 04/20/2018 50* >60 mL/min Final   Comment: (NOTE) The eGFR has been calculated using the CKD EPI equation. This calculation has not been validated in all clinical situations. eGFR's persistently <60 mL/min signify possible Chronic Kidney Disease.   Georgiann Hahn gap 04/20/2018 6  5 - 15 Final   Performed at [facility], [address] [place], [place] [address]  . LDH 04/20/2018 136  98 - 192 U/L Final   Performed at [facility], [address], [place], [place] [address]     Pathology Orders Placed This Encounter  Procedures  . CT CHEST W CONTRAST    Standing Status:   Future    Standing Expiration Date:   04/22/2019    Order Specific Question:   If indicated for the ordered procedure, I authorize the administration of contrast media per Radiology protocol    Answer:   Yes    Order Specific Question:   Preferred imaging location?    Answer:   [facility]    Order Specific Question:   Radiology Contrast Protocol - do NOT remove file path    Answer:   [email or web address]  . CT Abdomen Pelvis W Contrast    Standing Status:   Future    Standing Expiration Date:   04/22/2019    Order Specific Question:   If indicated for the ordered procedure, I authorize the administration of contrast media  per Radiology protocol    Answer:    Yes    Order Specific Question:   Preferred imaging location?    Answer:   [facility]    Order Specific Question:   Is Oral Contrast requested for this exam?    Answer:   Yes, Per Radiology protocol    Order Specific Question:   Radiology Contrast Protocol - do NOT remove file path    Answer:   [email or web address]  . CBC with Differential/Platelet    Standing Status:   Future    Standing Expiration Date:   04/23/2019  . Comprehensive metabolic panel    Standing Status:   Future    Standing Expiration Date:   04/23/2019  . Lactate dehydrogenase    Standing Status:   Future    Standing Expiration Date:   04/23/2019       Zoila Shutter MD

## 2018-04-22 NOTE — Patient Instructions (Signed)
Jeffery Ponce at [facility] Discharge Instructions  Seen by Dr. Walden Field today. Follow up in 6 months after scans, labs Refer to surgery to have port taken out   Thank you for choosing [place] at [facility] to provide your oncology and hematology care.  To afford each patient quality time with our provider, please arrive at least 15 minutes before your scheduled appointment time.   If you have a lab appointment with the [facility] please come in thru the  Main Entrance and check in at the main information desk  You need to re-schedule your appointment should you arrive 10 or more minutes late.  We strive to give you quality time with our providers, and arriving late affects you and other patients whose appointments are after yours.  Also, if you no show three or more times for appointments you may be dismissed from the clinic at the providers discretion.     Again, thank you for choosing [facility].  Our hope is that these requests will decrease the amount of time that you wait before being seen by our physicians.       _____________________________________________________________  Should you have questions after your visit to [facility], please contact our office at [phone] between the hours of 8:30 a.m. and 4:30 p.m.  Voicemails left after 4:30 p.m. will not be returned until the following business day.  For prescription refill requests, have your pharmacy contact our office.       Resources For Cancer Patients and their Caregivers ? American Cancer Society: Can assist with transportation, wigs, general needs, runs Look Good Feel Better.        445-399-3402 ? Cancer Care: Provides financial assistance, online support groups, medication/co-pay assistance.  1-800-813-HOPE 850-349-7394) ? [facility] Assists [place] cancer patients and their families through emotional , educational  and financial support.  701-618-9076 ? [place] DSS Where to apply for food stamps, Medicaid and utility assistance. (337) 059-6578 ? [facility]: Transportation to medical appointments. 4153023179 ? Social Security Administration: May apply for disability if have a Stage IV cancer. (916) 738-8828 469 079 3613 ? [employer], Disability and Transit Services: Assists with nutrition, care and transit needs. [place] Support Programs:   > Cancer Support Group  2nd Tuesday of the month 1pm-2pm, Journey Room   > Creative Journey  3rd Tuesday of the month 1130am-1pm, Journey Room

## 2018-05-05 ENCOUNTER — Ambulatory Visit: Payer: Medicare HMO | Admitting: General Surgery

## 2018-05-05 ENCOUNTER — Ambulatory Visit (HOSPITAL_COMMUNITY): Payer: Medicare HMO

## 2018-06-02 ENCOUNTER — Ambulatory Visit: Payer: Medicare HMO | Admitting: General Surgery

## 2018-06-25 ENCOUNTER — Telehealth (HOSPITAL_COMMUNITY): Payer: Self-pay

## 2018-06-25 NOTE — Telephone Encounter (Signed)
Recieved refill request from patients pharmacy for [place]. Called pharmacy and spoke with Quita Skye and explained that we had not filled this med for the patient since 03/2017. Patient will need to obtain refills from PCP. Jeffery Ponce verbalized understanding and states he will let the patient know.

## 2018-07-07 ENCOUNTER — Ambulatory Visit: Payer: Medicare HMO | Admitting: General Surgery

## 2018-07-07 ENCOUNTER — Encounter: Payer: Self-pay | Admitting: General Surgery

## 2018-07-07 VITALS — BP 143/64 | HR 73 | Temp 97.8°F | Resp 20 | Wt 172.0 lb

## 2018-07-07 DIAGNOSIS — C679 Malignant neoplasm of bladder, unspecified: Secondary | ICD-10-CM | POA: Diagnosis not present

## 2018-07-07 NOTE — Progress Notes (Signed)
Jeffery Ponce; [id number]; 03/11/1946   HPI Patient is a 72 year old white male who was referred to my care by Dr. Delton Coombes for removal of Port-A-Cath.  The patient had a Port-A-Cath placed for treatment of a bladder cancer.  He has finished the treatment and now is referred for removal.  He has 0 out of 10 pain at the Port-A-Cath site.  He has been working fine. Past Medical History:  Diagnosis Date  . Arthritis   . Chronic renal disease, stage 3, moderately decreased glomerular filtration rate (GFR) between 30-59 mL/min/1.73 square meter (HCC) 11/28/2017  . History of kidney stones   . Hypercholesteremia   . Urothelial carcinoma ([place]) 12/10/2016  . 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 CARDIAC CATHETERIZATION    . CYSTOSCOPY WITH INJECTION N/A 07/30/2017   Procedure: CYSTOSCOPY WITH INJECTION OF INDOCYANINE GREEN DYE;  Surgeon: Alexis Frock, MD;  Location: [facility];  Service: Urology;  Laterality: N/A;  . open reduction and internal fixation leg Right    hip and leg.  Marland Kitchen PORTACATH PLACEMENT N/A 12/23/2016   Procedure: PLACEMENT OF TUNNELED CENTRAL VENOUS CATHETER RIGHT INTERNAL JUGULAR WITH SUBCUTANEOUS PORT;  Surgeon: Vickie Epley, MD;  Location: [facility];  Service: Vascular;  Laterality: N/A;  . reattatchment of left arm     from [place]  . TRANSURETHRAL RESECTION OF BLADDER TUMOR N/A 11/19/2016   Procedure: TRANSURETHRAL RESECTION OF BLADDER TUMOR (TURBT) WITH EPIRUBICIN INJECTION;  Surgeon: Franchot Gallo, MD;  Location: [facility];  Service: Urology;  Laterality: N/A;    Family History  Problem Relation Age of Onset  . Aneurysm Mother   . Diabetes Father   . Colon cancer Brother     Current Outpatient Medications on File Prior to Visit  Medication Sig Dispense Refill  . Oxycodone HCl 10 MG TABS Take 1-2 tablets (10-20 mg total) by mouth every 4 (four) hours as needed (pain). Post-operatively 40 tablet 0    No current facility-administered medications on file prior to visit.     Allergies  Allergen Reactions  . Penicillins Anaphylaxis    Has patient had a PCN reaction causing immediate rash, facial/tongue/throat swelling, SOB or lightheadedness with hypotension:Yes Has patient had a PCN reaction causing severe rash involving mucus membranes or skin necrosis:Yes Has patient had a PCN reaction that required hospitalization:No Has patient had a PCN reaction occurring within the last 10 years:No If all of the above answers are "NO", then may proceed with Cephalosporin use.   . Ciprofloxacin Itching    Itching at IV site. No hives or shortness of breathe.  Marland Kitchen Ambien [Zolpidem Tartrate] Other (See Comments)    "Sleep walking"    Social History   Substance and Sexual Activity  Alcohol Use No    Social History   Tobacco Use  Smoking Status Current Every Day Smoker  . Packs/day: 2.00  . Years: 50.00  . Pack years: 100.00  . Types: Cigarettes  Smokeless Tobacco Never Used    Review of Systems  Constitutional: Negative.   HENT: Negative.   Eyes: Negative.   Respiratory: Negative.   Cardiovascular: Negative.   Gastrointestinal: Negative.   Genitourinary: Negative.   Musculoskeletal: Negative.   Skin: Negative.   Neurological: Negative.   Endo/Heme/Allergies: Negative.   Psychiatric/Behavioral: Negative.     Objective   Vitals:   07/07/18 0933  BP: (!) 143/64  Pulse: 73  Resp: 20  Temp: 97.8 F (36.6  C)    Physical Exam  Constitutional: He is oriented to person, place, and time. He appears well-developed and well-nourished. No distress.  HENT:  Head: Normocephalic and atraumatic.  Cardiovascular: Normal rate, regular rhythm and normal heart sounds. Exam reveals no gallop and no friction rub.  No murmur heard. Pulmonary/Chest: Effort normal and breath sounds normal. No stridor. No respiratory distress. He has no wheezes. He has no rales.  Port-A-Cath in place  right upper chest.  Neurological: He is alert and oriented to person, place, and time.  Skin: Skin is warm and dry.  Vitals reviewed.  Dr. Tomie China notes reviewed Assessment   Bladder cancer, finished with chemotherapy  Plan   Patient is scheduled for Port-A-Cath removal on 07/20/2018 in the minor procedure room.  The risks and benefits of the procedure including bleeding and infection were fully explained to the patient, who gave informed consent.

## 2018-07-07 NOTE — Patient Instructions (Signed)
Implanted Port Removal Implanted port removal is a procedure to remove the port and catheter (port-a-cath) that is implanted under your skin. The port is a small disc under your skin that can be punctured with a needle. It is connected to a vein in your chest or neck by a small flexible tube (catheter). The port-a-cath is used for treatment through an IV tube and for taking blood samples. Your health care provider will remove the port-a-cath if:  You no longer need it for treatment.  It is not working properly.  The area around it gets infected.  Tell a health care provider about:  Any allergies you have.  All medicines you are taking, including vitamins, herbs, eye drops, creams, and over-the-counter medicines.  Any problems you or family members have had with anesthetic medicines.  Any blood disorders you have.  Any surgeries you have had.  Any medical conditions you have.  Whether you are pregnant or may be pregnant. What are the risks? Generally, this is a safe procedure. However, problems may occur, including:  Infection.  Bleeding.  Allergic reactions to anesthetic medicines.  Damage to nerves or blood vessels.  What happens before the procedure?  You will have: ? A physical exam. ? Blood tests. ? Imaging tests, including a chest X-ray.  Follow instructions from your health care provider about eating or drinking restrictions.  Ask your health care provider about: ? Changing or stopping your regular medicines. This is especially important if you are taking diabetes medicines or blood thinners. ? Taking medicines such as aspirin and ibuprofen. These medicines can thin your blood. Do not take these medicines before your procedure if your surgeon instructs you not to.  Ask your health care provider how your surgical site will be marked or identified.  You may be given antibiotic medicine to help prevent infection.  Plan to have someone take you home after the  procedure.  If you will be going home right after the procedure, plan to have someone stay with you for 24 hours. What happens during the procedure?  To reduce your risk of infection: ? Your health care team will wash or sanitize their hands. ? Your skin will be washed with soap.  You may be given one or more of the following: ? A medicine to help you relax (sedative). ? A medicine to numb the area (local anesthetic).  A small cut (incision) will be made at the site of your port-a-cath.  The port-a-cath and the catheter that has been inside your vein will gently be removed.  The incision will be closed with stitches (sutures), adhesive strips, or skin glue.  A bandage (dressing) will be placed over the incision. The procedure may vary among health care providers and hospitals. What happens after the procedure?  Your blood pressure, heart rate, breathing rate, and blood oxygen level will be monitored often until the medicines you were given have worn off.  Do not drive for 24 hours if you received a sedative. This information is not intended to replace advice given to you by your health care provider. Make sure you discuss any questions you have with your health care provider. Document Released: 11/06/2015 Document Revised: 05/02/2016 Document Reviewed: 08/30/2015 Elsevier Interactive Patient Education  2018 [employer]  

## 2018-07-07 NOTE — H&P (Signed)
Jeffery Ponce; [id number]; 1946/12/06   HPI Patient is a 72 year old white male who was referred to my care by Dr. Delton Coombes for removal of Port-A-Cath.  The patient had a Port-A-Cath placed for treatment of a bladder cancer.  He has finished the treatment and now is referred for removal.  He has 0 out of 10 pain at the Port-A-Cath site.  He has been working fine. Past Medical History:  Diagnosis Date  . Arthritis   . Chronic renal disease, stage 3, moderately decreased glomerular filtration rate (GFR) between 30-59 mL/min/1.73 square meter (HCC) 11/28/2017  . History of kidney stones   . Hypercholesteremia   . Urothelial carcinoma ([place]) 12/10/2016  . 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 CARDIAC CATHETERIZATION    . CYSTOSCOPY WITH INJECTION N/A 07/30/2017   Procedure: CYSTOSCOPY WITH INJECTION OF INDOCYANINE GREEN DYE;  Surgeon: Alexis Frock, MD;  Location: [facility];  Service: Urology;  Laterality: N/A;  . open reduction and internal fixation leg Right    hip and leg.  Marland Kitchen PORTACATH PLACEMENT N/A 12/23/2016   Procedure: PLACEMENT OF TUNNELED CENTRAL VENOUS CATHETER RIGHT INTERNAL JUGULAR WITH SUBCUTANEOUS PORT;  Surgeon: Vickie Epley, MD;  Location: [facility];  Service: Vascular;  Laterality: N/A;  . reattatchment of left arm     from [place]  . TRANSURETHRAL RESECTION OF BLADDER TUMOR N/A 11/19/2016   Procedure: TRANSURETHRAL RESECTION OF BLADDER TUMOR (TURBT) WITH EPIRUBICIN INJECTION;  Surgeon: Franchot Gallo, MD;  Location: [facility];  Service: Urology;  Laterality: N/A;    Family History  Problem Relation Age of Onset  . Aneurysm Mother   . Diabetes Father   . Colon cancer Brother     Current Outpatient Medications on File Prior to Visit  Medication Sig Dispense Refill  . Oxycodone HCl 10 MG TABS Take 1-2 tablets (10-20 mg total) by mouth every 4 (four) hours as needed (pain). Post-operatively 40 tablet 0    No current facility-administered medications on file prior to visit.     Allergies  Allergen Reactions  . Penicillins Anaphylaxis    Has patient had a PCN reaction causing immediate rash, facial/tongue/throat swelling, SOB or lightheadedness with hypotension:Yes Has patient had a PCN reaction causing severe rash involving mucus membranes or skin necrosis:Yes Has patient had a PCN reaction that required hospitalization:No Has patient had a PCN reaction occurring within the last 10 years:No If all of the above answers are "NO", then may proceed with Cephalosporin use.   . Ciprofloxacin Itching    Itching at IV site. No hives or shortness of breathe.  Marland Kitchen Ambien [Zolpidem Tartrate] Other (See Comments)    "Sleep walking"    Social History   Substance and Sexual Activity  Alcohol Use No    Social History   Tobacco Use  Smoking Status Current Every Day Smoker  . Packs/day: 2.00  . Years: 50.00  . Pack years: 100.00  . Types: Cigarettes  Smokeless Tobacco Never Used    Review of Systems  Constitutional: Negative.   HENT: Negative.   Eyes: Negative.   Respiratory: Negative.   Cardiovascular: Negative.   Gastrointestinal: Negative.   Genitourinary: Negative.   Musculoskeletal: Negative.   Skin: Negative.   Neurological: Negative.   Endo/Heme/Allergies: Negative.   Psychiatric/Behavioral: Negative.     Objective   Vitals:   07/07/18 0933  BP: (!) 143/64  Pulse: 73  Resp: 20  Temp: 97.8 F (36.6  C)    Physical Exam  Constitutional: He is oriented to person, place, and time. He appears well-developed and well-nourished. No distress.  HENT:  Head: Normocephalic and atraumatic.  Cardiovascular: Normal rate, regular rhythm and normal heart sounds. Exam reveals no gallop and no friction rub.  No murmur heard. Pulmonary/Chest: Effort normal and breath sounds normal. No stridor. No respiratory distress. He has no wheezes. He has no rales.  Port-A-Cath in place  right upper chest.  Neurological: He is alert and oriented to person, place, and time.  Skin: Skin is warm and dry.  Vitals reviewed.  Dr. Tomie China notes reviewed Assessment   Bladder cancer, finished with chemotherapy  Plan   Patient is scheduled for Port-A-Cath removal on 07/20/2018 in the minor procedure room.  The risks and benefits of the procedure including bleeding and infection were fully explained to the patient, who gave informed consent.

## 2018-07-20 ENCOUNTER — Ambulatory Visit (HOSPITAL_COMMUNITY)
Admission: RE | Admit: 2018-07-20 | Discharge: 2018-07-20 | Disposition: A | Discharge status: Home / Self Care | Payer: Medicare HMO | Source: Ambulatory Visit | Attending: General Surgery | Admitting: General Surgery

## 2018-07-20 ENCOUNTER — Encounter (HOSPITAL_COMMUNITY)
Admission: RE | Disposition: A | Discharge status: Home / Self Care | Payer: Self-pay | Source: Ambulatory Visit | Attending: General Surgery

## 2018-07-20 DIAGNOSIS — Z452 Encounter for adjustment and management of vascular access device: Secondary | ICD-10-CM | POA: Diagnosis present

## 2018-07-20 DIAGNOSIS — N183 Chronic kidney disease, stage 3 (moderate): Secondary | ICD-10-CM | POA: Diagnosis not present

## 2018-07-20 DIAGNOSIS — Z79891 Long term (current) use of opiate analgesic: Secondary | ICD-10-CM | POA: Diagnosis not present

## 2018-07-20 DIAGNOSIS — C679 Malignant neoplasm of bladder, unspecified: Secondary | ICD-10-CM

## 2018-07-20 DIAGNOSIS — F17210 Nicotine dependence, cigarettes, uncomplicated: Secondary | ICD-10-CM | POA: Insufficient documentation

## 2018-07-20 DIAGNOSIS — Z9221 Personal history of antineoplastic chemotherapy: Secondary | ICD-10-CM | POA: Insufficient documentation

## 2018-07-20 DIAGNOSIS — Z8551 Personal history of malignant neoplasm of bladder: Secondary | ICD-10-CM | POA: Insufficient documentation

## 2018-07-20 HISTORY — PX: PORT-A-CATH REMOVAL: SHX5289

## 2018-07-20 SURGERY — REMOVAL PORT-A-CATH
Anesthesia: LOCAL

## 2018-07-20 MED ORDER — LIDOCAINE HCL (PF) 1 % IJ SOLN
INTRAMUSCULAR | Status: AC
  Filled 2018-07-20: qty 30

## 2018-07-20 SURGICAL SUPPLY — 16 items
ADH SKN CLS APL DERMABOND .7 (GAUZE/BANDAGES/DRESSINGS) ×1
CHLORAPREP W/TINT 10.5 ML (MISCELLANEOUS) ×2 IMPLANT
CLOTH BEACON ORANGE TIMEOUT ST (SAFETY) ×2 IMPLANT
DECANTER SPIKE VIAL GLASS SM (MISCELLANEOUS) ×2 IMPLANT
DERMABOND ADVANCED (GAUZE/BANDAGES/DRESSINGS) ×1
DERMABOND ADVANCED .7 DNX12 (GAUZE/BANDAGES/DRESSINGS) ×1 IMPLANT
DRAPE PROXIMA HALF (DRAPES) ×2 IMPLANT
ELECT REM PT RETURN 9FT ADLT (ELECTROSURGICAL) ×2
ELECTRODE REM PT RTRN 9FT ADLT (ELECTROSURGICAL) ×1 IMPLANT
GLOVE BIOGEL PI IND STRL 7.0 (GLOVE) ×1 IMPLANT
GLOVE BIOGEL PI INDICATOR 7.0 (GLOVE) ×1
GLOVE SURG SS PI 7.5 STRL IVOR (GLOVE) ×2 IMPLANT
GOWN STRL REUS W/TWL LRG LVL3 (GOWN DISPOSABLE) ×2 IMPLANT
SUT MNCRL AB 4-0 PS2 18 (SUTURE) ×2 IMPLANT
SUT VIC AB 3-0 SH 27 (SUTURE) ×2
SUT VIC AB 3-0 SH 27X BRD (SUTURE) ×1 IMPLANT

## 2018-07-20 NOTE — Op Note (Signed)
Patient:  Jeffery Ponce  DOB:  [birth year]  MRN:  [id number]   Preop Diagnosis: Bladder cancer, finished with chemotherapy  Postop Diagnosis: Same  Procedure: Port-A-Cath removal  Surgeon: Aviva Signs, MD  Anes: Local  Indications: Patient is a 72 year old white male who presents for Port-A-Cath removal.  He is finished with chemotherapy for his bladder cancer.  The risks and benefits of the procedure including bleeding and infection were fully explained to the patient, who gave informed consent.  Procedure note: The patient was placed in supine position.  The right upper chest was prepped and draped using the usual sterile technique with DuraPrep.  Surgical site confirmation was performed.  1% Xylocaine was used for local anesthesia.  An incision was made over the Port-A-Cath.  The dissection was taken down to the port.  The Port-A-Cath was removed in total without difficulty.  The port was disposed of.  Pressure was held in the right neck for 5 minutes.  The subcutaneous area was reapproximated using a 3-0 Vicryl interrupted suture.  The skin was closed using a 4-0 Monocryl subcuticular suture.  Dermabond was applied.  All tape and needle counts were correct at the end of the procedure.  The patient was discharged from the minor procedure room in good and stable condition.  Complications: None  EBL: Minimal  Specimen: None

## 2018-07-20 NOTE — Interval H&P Note (Signed)
History and Physical Interval Note:  07/20/2018 9:13 AM  Jeffery Ponce  has presented today for surgery, with the diagnosis of bladder cancer  The various methods of treatment have been discussed with the patient and family. After consideration of risks, benefits and other options for treatment, the patient has consented to  Procedure(s): REMOVAL PORT-A-CATH (N/A) as a surgical intervention .  The patient's history has been reviewed, patient examined, no change in status, stable for surgery.  I have reviewed the patient's chart and labs.  Questions were answered to the patient's satisfaction.     Aviva Signs

## 2018-07-20 NOTE — Discharge Instructions (Signed)
Implanted Port Removal, Care After °Refer to this sheet in the next few weeks. These instructions provide you with information about caring for yourself after your procedure. Your health care provider may also give you more specific instructions. Your treatment has been planned according to current medical practices, but problems sometimes occur. Call your health care provider if you have any problems or questions after your procedure. °What can I expect after the procedure? °After the procedure, it is common to have: °· Soreness or pain near your incision. °· Some swelling or bruising near your incision. ° °Follow these instructions at home: °Medicines °· Take over-the-counter and prescription medicines only as told by your health care provider. °· If you were prescribed an antibiotic medicine, take it as told by your health care provider. Do not stop taking the antibiotic even if you start to feel better. °Bathing °· Do not take baths, swim, or use a hot tub until your health care provider approves. Ask your health care provider if you can take showers. You may only be allowed to take sponge baths for bathing. °Incision care °· Follow instructions from your health care provider about how to take care of your incision. Make sure you: °? Wash your hands with soap and water before you change your bandage (dressing). If soap and water are not available, use hand sanitizer. °? Change your dressing as told by your health care provider. °? Keep your dressing dry. °? Leave stitches (sutures), skin glue, or adhesive strips in place. These skin closures may need to stay in place for 2 weeks or longer. If adhesive strip edges start to loosen and curl up, you may trim the loose edges. Do not remove adhesive strips completely unless your health care provider tells you to do that. °· Check your incision area every day for signs of infection. Check for: °? More redness, swelling, or pain. °? More fluid or  blood. °? Warmth. °? Pus or a bad smell. °Driving °· If you received a sedative, do not drive for 24 hours after the procedure. °· If you did not receive a sedative, ask your health care provider when it is safe to drive. °Activity °· Return to your normal activities as told by your health care provider. Ask your health care provider what activities are safe for you. °· Until your health care provider says it is safe: °? Do not lift anything that is heavier than 10 lb (4.5 kg). °? Do not do activities that involve lifting your arms over your head. °General instructions °· Do not use any tobacco products, such as cigarettes, chewing tobacco, and e-cigarettes. Tobacco can delay healing. If you need help quitting, ask your health care provider. °· Keep all follow-up visits as told by your health care provider. This is important. °Contact a health care provider if: °· You have more redness, swelling, or pain around your incision. °· You have more fluid or blood coming from your incision. °· Your incision feels warm to the touch. °· You have pus or a bad smell coming from your incision. °· You have a fever. °· You have pain that is not relieved by your pain medicine. °Get help right away if: °· You have chest pain. °· You have difficulty breathing. °This information is not intended to replace advice given to you by your health care provider. Make sure you discuss any questions you have with your health care provider. °Document Released: 11/06/2015 Document Revised: 05/02/2016 Document Reviewed: 08/30/2015 °Elsevier Interactive Patient   Education © 2018 [employer] ° °

## 2018-07-21 ENCOUNTER — Encounter (HOSPITAL_COMMUNITY): Payer: Self-pay | Admitting: General Surgery

## 2018-07-31 ENCOUNTER — Other Ambulatory Visit (HOSPITAL_COMMUNITY): Payer: Self-pay | Admitting: Family Medicine

## 2018-07-31 DIAGNOSIS — I739 Peripheral vascular disease, unspecified: Secondary | ICD-10-CM

## 2018-08-07 ENCOUNTER — Ambulatory Visit (HOSPITAL_COMMUNITY)
Admission: RE | Admit: 2018-08-07 | Discharge: 2018-08-07 | Disposition: A | Discharge status: Home / Self Care | Payer: Medicare HMO | Source: Ambulatory Visit | Attending: Family Medicine | Admitting: Family Medicine

## 2018-08-07 DIAGNOSIS — I25811 Atherosclerosis of native coronary artery of transplanted heart without angina pectoris: Secondary | ICD-10-CM | POA: Insufficient documentation

## 2018-08-07 DIAGNOSIS — I739 Peripheral vascular disease, unspecified: Secondary | ICD-10-CM | POA: Insufficient documentation

## 2018-08-25 ENCOUNTER — Ambulatory Visit (INDEPENDENT_AMBULATORY_CARE_PROVIDER_SITE_OTHER): Payer: Medicare HMO | Admitting: Vascular Surgery

## 2018-08-25 ENCOUNTER — Encounter (INDEPENDENT_AMBULATORY_CARE_PROVIDER_SITE_OTHER): Payer: Self-pay | Admitting: Vascular Surgery

## 2018-08-25 VITALS — BP 141/80 | HR 87 | Resp 16 | Ht 70.0 in | Wt 158.0 lb

## 2018-08-25 DIAGNOSIS — I739 Peripheral vascular disease, unspecified: Secondary | ICD-10-CM | POA: Diagnosis not present

## 2018-08-25 DIAGNOSIS — Z720 Tobacco use: Secondary | ICD-10-CM

## 2018-08-25 NOTE — Progress Notes (Signed)
Subjective:    Patient ID: Jeffery Ponce, male    DOB: 1946-08-07, 72 y.o.   MRN: [id number] Chief Complaint  Patient presents with  . New Patient (Initial Visit)    LE PAD-Consult   Presents as a new patient referred by Dr. Angelia Mould for evaluation of peripheral artery disease.  The patient endorses a long-standing history of progressively worsening bilateral calf claudication.  The patient underwent a bilateral ABI ordered by his primary care physician on August 07, 2018 which was notable for ABIs of 0.5 bilaterally.  The patient notes a shortening in his interval claudication distance.  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  The patient notes that is becoming harder to walk longer distances of time before experiencing claudication to his bilateral calves.  The patient denies any rest pain or ulcer formation to the bilateral legs.  The patient notes that his symptoms have become lifestyle limiting.  The patient denies any recent surgery or trauma to the bilateral legs.  The patient denies any fever, nausea or vomiting.  Review of Systems  Constitutional: Negative.   HENT: Negative.   Eyes: Negative.   Respiratory: Negative.   Cardiovascular:       Calf Pain  Gastrointestinal: Negative.   Endocrine: Negative.   Genitourinary: Negative.   Musculoskeletal: Negative.   Skin: Negative.   Allergic/Immunologic: Negative.   Neurological: Negative.   Hematological: Negative.   Psychiatric/Behavioral: Negative.       Objective:   Physical Exam  Constitutional: He is oriented to person, place, and time. He appears well-developed and well-nourished. No distress.  HENT:  Head: Normocephalic and atraumatic.  Right Ear: External ear normal.  Left Ear: External ear normal.  Eyes: Pupils are equal, round, and reactive to light. Conjunctivae and EOM are normal.  Neck: Normal range of motion.  Cardiovascular: Normal rate, regular rhythm, normal heart  sounds and intact distal pulses.  Pulses:      Radial pulses are 2+ on the right side, and 2+ on the left side.  Hard to palpate pedal pulses however the bilateral feet are warm.  Good capillary refill.  Pulmonary/Chest: Effort normal and breath sounds normal.  Musculoskeletal: Normal range of motion. He exhibits no edema.  Neurological: He is alert and oriented to person, place, and time.  Skin: Skin is warm and dry. He is not diaphoretic.  Psychiatric: He has a normal mood and affect. His behavior is normal. Judgment and thought content normal.  Vitals reviewed.  BP (!) 141/80 (BP Location: Right Arm, Patient Position: Sitting)   Pulse 87   Resp 16   Ht 5\' 10"  (1.778 m)   Wt 158 lb (71.7 kg)   BMI 22.67 kg/m   Past Medical History:  Diagnosis Date  . Arthritis   . Chronic renal disease, stage 3, moderately decreased glomerular filtration rate (GFR) between 30-59 mL/min/1.73 square meter (HCC) 11/28/2017  . History of kidney stones   . Hypercholesteremia   . Urothelial carcinoma ([place]) 12/10/2016  . 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 Marital status: Married    Spouse name: Not on file  . Number of children: Not on file  . Years of education: Not on file  . Highest education level: Not on file  Occupational History  . Not on file  Social Needs  . Financial resource strain: Not on file  . Food insecurity:  Worry: Not on file    Inability: Not on file  . Transportation needs:    Medical: Not on file    Non-medical: Not on file  Tobacco Use  . Smoking status: Current Every Day Smoker    Packs/day: 2.00    Years: 50.00    Pack years: 100.00    Types: Cigarettes  . Smokeless tobacco: Never Used  Substance and Sexual Activity  . Alcohol use: No  . Drug use: No  . Sexual activity: Never    Birth control/protection: None    Comment: married-30 years-2 children by first wife  Lifestyle  . Physical activity:     Days per week: Not on file    Minutes per session: Not on file  . Stress: Not on file  Relationships  . Social connections:    Talks on phone: Not on file    Gets together: Not on file    Attends religious service: Not on file    Active member of club or organization: Not on file    Attends meetings of clubs or organizations: Not on file    Relationship status: Not on file  . Intimate partner violence:    Fear of current or ex partner: Not on file    Emotionally abused: Not on file    Physically abused: Not on file    Forced sexual activity: Not on file  Other Topics Concern  . Not on file  Social History Narrative  . Not on file   Past Surgical History:  Procedure Laterality Date  . CARDIAC CATHETERIZATION    . CYSTOSCOPY WITH INJECTION N/A 07/30/2017   Procedure: CYSTOSCOPY WITH INJECTION OF INDOCYANINE GREEN DYE;  Surgeon: Alexis Frock, MD;  Location: [facility];  Service: Urology;  Laterality: N/A;  . open reduction and internal fixation leg Right    hip and leg.  Marland Kitchen PORT-A-CATH REMOVAL N/A 07/20/2018   Procedure: REMOVAL PORT-A-CATH;  Surgeon: Aviva Signs, MD;  Location: [facility];  Service: General;  Laterality: N/A;  . PORTACATH PLACEMENT N/A 12/23/2016   Procedure: PLACEMENT OF TUNNELED CENTRAL VENOUS CATHETER RIGHT INTERNAL JUGULAR WITH SUBCUTANEOUS PORT;  Surgeon: Vickie Epley, MD;  Location: [facility];  Service: Vascular;  Laterality: N/A;  . reattatchment of left arm     from [place]  . TRANSURETHRAL RESECTION OF BLADDER TUMOR N/A 11/19/2016   Procedure: TRANSURETHRAL RESECTION OF BLADDER TUMOR (TURBT) WITH EPIRUBICIN INJECTION;  Surgeon: Franchot Gallo, MD;  Location: [facility];  Service: Urology;  Laterality: N/A;   Family History  Problem Relation Age of Onset  . Aneurysm Mother   . Diabetes Father   . Colon cancer Brother    Allergies  Allergen Reactions  . Penicillins Anaphylaxis    Has patient had a PCN reaction causing immediate rash, facial/tongue/throat  swelling, SOB or lightheadedness with hypotension:Yes Has patient had a PCN reaction causing severe rash involving mucus membranes or skin necrosis:Yes Has patient had a PCN reaction that required hospitalization:No Has patient had a PCN reaction occurring within the last 10 years:No If all of the above answers are "NO", then may proceed with Cephalosporin use.   . Ciprofloxacin Itching    Itching at IV site. No hives or shortness of breathe.  . Ambien [Zolpidem Tartrate] Other (See Comments)    "Sleep walking"      Assessment & Plan:  Presents as a new patient referred by Dr. Angelia Mould for evaluation of peripheral artery disease.  The patient endorses a long-standing history  of progressively worsening bilateral calf claudication.  The patient underwent a bilateral ABI ordered by his primary care physician on August 07, 2018 which was notable for ABIs of 0.5 bilaterally.  The patient notes a shortening in his interval claudication distance.  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  The patient notes that is becoming harder to walk longer distances of time before experiencing claudication to his bilateral calves.  The patient denies any rest pain or ulcer formation to the bilateral legs.  The patient notes that his symptoms have become lifestyle limiting.  The patient denies any recent surgery or trauma to the bilateral legs.  The patient denies any fever, nausea or vomiting.  1. PAD (peripheral artery disease) ([place]) - New Patient with progressively worsening calf claudication. ABI ordered by his primary care physician was notable to be at 0.5 bilaterally The patient feels that his symptoms have progressed to the point that they have become lifestyle limiting. We had a long discussion about moving forward with a angiogram starting with the right leg then followed by the left leg.  The patient notes that his symptoms are slightly worse to the right lower  extremity. At this time, the patient would like to wait till after hunting season.  The patient would like to follow-up specifically in March. I bring the patient back in March 2020 and have him undergo an ABI and bilateral lower extremity arterial duplex to assess the patient's anatomy and degree of contributing peripheral artery disease At that time, we can we discussed moving forward with an angiogram I have discussed with the patient at length the risk factors for and pathogenesis of atherosclerotic disease and encouraged a healthy diet, regular exercise regimen and blood pressure / glucose control.  The patient was encouraged to call the office in the interim if he experiences any claudication like symptoms, rest pain or ulcers to his feet / toes.  - VAS [place] ABI WITH/WO TBI; Future - VAS [place] LOWER EXTREMITY ARTERIAL DUPLEX; Future  2. Tobacco abuse - Stable We had a discussion for approximately five minutes regarding the absolute need for smoking cessation due to the deleterious nature of tobacco on the vascular system. We discussed the tobacco use would diminish patency of any intervention, and likely significantly worsen progressio of disease. We discussed multiple agents for quitting including replacement therapy or medications to reduce cravings such as Chantix. The patient voices their understanding of the importance of smoking cessation.  Current Outpatient Medications on File Prior to Visit  Medication Sig Dispense Refill  . mirtazapine (REMERON SOL-TAB) 15 MG disintegrating tablet Take 15 mg by mouth at bedtime.    Marland Kitchen oxyCODONE ER (XTAMPZA ER) 13.5 MG C12A Xtampza ER 13.5 mg capsule sprinkle    . Oxycodone HCl 10 MG TABS Take 1-2 tablets (10-20 mg total) by mouth every 4 (four) hours as needed (pain). Post-operatively 40 tablet 0  . Multiple Vitamin (MULTIVITAMIN) capsule Take 1 capsule by mouth daily.     No current facility-administered medications on file prior to visit.    There  are no Patient Instructions on file for this visit. No follow-ups on file.  Shayleen Eppinger A Keilah Lemire, [name]

## 2018-09-30 ENCOUNTER — Ambulatory Visit (INDEPENDENT_AMBULATORY_CARE_PROVIDER_SITE_OTHER): Payer: Medicare HMO | Admitting: Vascular Surgery

## 2018-09-30 ENCOUNTER — Encounter (INDEPENDENT_AMBULATORY_CARE_PROVIDER_SITE_OTHER): Payer: Self-pay | Admitting: Vascular Surgery

## 2018-09-30 ENCOUNTER — Other Ambulatory Visit: Payer: Self-pay

## 2018-09-30 VITALS — BP 150/76 | HR 80 | Resp 16 | Ht 70.0 in | Wt 159.0 lb

## 2018-09-30 DIAGNOSIS — Z720 Tobacco use: Secondary | ICD-10-CM

## 2018-09-30 DIAGNOSIS — I739 Peripheral vascular disease, unspecified: Secondary | ICD-10-CM | POA: Diagnosis not present

## 2018-09-30 DIAGNOSIS — N183 Chronic kidney disease, stage 3 unspecified: Secondary | ICD-10-CM

## 2018-09-30 NOTE — Progress Notes (Signed)
Subjective:    Patient ID: Jeffery Ponce, male    DOB: Apr 27, 1946, 72 y.o.   MRN: [id number] Chief Complaint  Patient presents with  . Follow-up    discuss angio   Patient seen approximately one month ago for an initial evaluation of peripheral artery disease. The patient underwent a bilateral ABI ordered by his primary care physician on August 07, 2018 which was notable for ABIs of 0.5 bilaterally.  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  The patient presents today stating that he is "unable to walk a quarter of a mile without experiencing painful bilateral calf cramping and his legs going out on him ".  At this time, the patient denies any rest pain or ulcer formation to the bilateral lower extremity.  Patient denies any edema to the bilateral lower extremity.  The patient is an active smoker.  The patient feels that his symptoms have progressed to the point that they have become lifestyle limiting and now he is ready to move forward with a bilateral lower extremity angiogram. Patient denies any fever, nausea vomiting.  Review of Systems  Constitutional: Negative.   HENT: Negative.   Eyes: Negative.   Respiratory: Negative.   Cardiovascular:       PAD Claudication   Gastrointestinal: Negative.   Endocrine: Negative.   Genitourinary: Negative.   Musculoskeletal: Negative.   Skin: Negative.   Allergic/Immunologic: Negative.   Neurological: Negative.   Hematological: Negative.   Psychiatric/Behavioral: Negative.       Objective:   Physical Exam  Constitutional: He is oriented to person, place, and time. He appears well-developed and well-nourished. No distress.  HENT:  Head: Normocephalic and atraumatic.  Right Ear: External ear normal.  Left Ear: External ear normal.  Eyes: Pupils are equal, round, and reactive to light. Conjunctivae and  EOM are normal.  Neck: Normal range of motion.  Cardiovascular: Normal rate, regular rhythm, normal heart sounds and intact distal pulses.  Pulses:      Radial pulses are 2+ on the right side, and 2+ on the left side.  Hard to palpate pedal pulses however the bilateral feet are warm.  Pulmonary/Chest: Effort normal and breath sounds normal.  Musculoskeletal: Normal range of motion. He exhibits no edema.  Neurological: He is alert and oriented to person, place, and time.  Skin: Skin is warm and dry. He is not diaphoretic.  Psychiatric: He has a normal mood and affect. His behavior is normal. Judgment and thought content normal.  Vitals reviewed.  BP (!) 150/76   Pulse 80   Resp 16   Ht 5\' 10"  (1.778 m)   Wt 159 lb (72.1 kg)   BMI 22.81 kg/m   Past Medical History:  Diagnosis Date  . Arthritis   . Chronic renal disease, stage 3, moderately decreased glomerular filtration rate (GFR) between 30-59 mL/min/1.73 square meter (HCC) 11/28/2017  . History of kidney stones   . Hypercholesteremia   . Urothelial carcinoma ([place]) 12/10/2016  . 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 Marital status: Married    Spouse name: Not on file  . Number of children: Not on file  . Years of education: Not on file  . Highest education level: Not on file  Occupational History  . Not on file  Social Needs  .  Financial resource strain: Not on file  . Food insecurity:    Worry: Not on file    Inability: Not on file  . Transportation needs:    Medical: Not on file    Non-medical: Not on file  Tobacco Use  . Smoking status: Current Every Day Smoker    Packs/day: 2.00    Years: 50.00    Pack years: 100.00    Types: Cigarettes  . Smokeless tobacco: Never Used  Substance and Sexual Activity  . Alcohol use: No  . Drug use: No  . Sexual activity: Never    Birth control/protection: None    Comment: married-30 years-2 children by first wife    Lifestyle  . Physical activity:    Days per week: Not on file    Minutes per session: Not on file  . Stress: Not on file  Relationships  . Social connections:    Talks on phone: Not on file    Gets together: Not on file    Attends religious service: Not on file    Active member of club or organization: Not on file    Attends meetings of clubs or organizations: Not on file    Relationship status: Not on file  . Intimate partner violence:    Fear of current or ex partner: Not on file    Emotionally abused: Not on file    Physically abused: Not on file    Forced sexual activity: Not on file  Other Topics Concern  . Not on file  Social History Narrative  . Not on file   Past Surgical History:  Procedure Laterality Date  . CARDIAC CATHETERIZATION    . CYSTOSCOPY WITH INJECTION N/A 07/30/2017   Procedure: CYSTOSCOPY WITH INJECTION OF INDOCYANINE GREEN DYE;  Surgeon: Alexis Frock, MD;  Location: [facility];  Service: Urology;  Laterality: N/A;  . open reduction and internal fixation leg Right    hip and leg.  Marland Kitchen PORT-A-CATH REMOVAL N/A 07/20/2018   Procedure: REMOVAL PORT-A-CATH;  Surgeon: Aviva Signs, MD;  Location: [facility];  Service: General;  Laterality: N/A;  . PORTACATH PLACEMENT N/A 12/23/2016   Procedure: PLACEMENT OF TUNNELED CENTRAL VENOUS CATHETER RIGHT INTERNAL JUGULAR WITH SUBCUTANEOUS PORT;  Surgeon: Vickie Epley, MD;  Location: [facility];  Service: Vascular;  Laterality: N/A;  . reattatchment of left arm     from [place]  . TRANSURETHRAL RESECTION OF BLADDER TUMOR N/A 11/19/2016   Procedure: TRANSURETHRAL RESECTION OF BLADDER TUMOR (TURBT) WITH EPIRUBICIN INJECTION;  Surgeon: Franchot Gallo, MD;  Location: [facility];  Service: Urology;  Laterality: N/A;   Family History  Problem Relation Age of Onset  . Aneurysm Mother   . Diabetes Father   . Colon cancer Brother    Allergies  Allergen Reactions  . Penicillins Anaphylaxis    Has patient had a PCN reaction  causing immediate rash, facial/tongue/throat swelling, SOB or lightheadedness with hypotension:Yes Has patient had a PCN reaction causing severe rash involving mucus membranes or skin necrosis:Yes Has patient had a PCN reaction that required hospitalization:No Has patient had a PCN reaction occurring within the last 10 years:No If all of the above answers are "NO", then may proceed with Cephalosporin use.   . Ciprofloxacin Itching    Itching at IV site. No hives or shortness of breathe.  . Ambien [Zolpidem Tartrate] Other (See Comments)    "Sleep walking"      Assessment & Plan:  Patient seen approximately one month ago for an  initial evaluation of peripheral artery disease. The patient underwent a bilateral ABI ordered by his primary care physician on August 07, 2018 which was notable for ABIs of 0.5 bilaterally.  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  The patient presents today stating that he is "unable to walk a quarter of a mile without experiencing painful bilateral calf cramping and his legs going out on him ".  At this time, the patient denies any rest pain or ulcer formation to the bilateral lower extremity.  Patient denies any edema to the bilateral lower extremity.  The patient is an active smoker.  The patient feels that his symptoms have progressed to the point that they have become lifestyle limiting and now he is ready to move forward with a bilateral lower extremity angiogram. Patient denies any fever, nausea vomiting.  1. PAD (peripheral artery disease) (HCC) - Stable Recent with known peripheral artery disease to the bilateral lower extremity. 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 The patient presents today noting that his symptoms have progressed to the point that he is unable to function debility basis and they have become lifestyle limiting Recommend undergoing a right lower extremity angiogram with intervention followed by a left lower extremity angiogram with possible intervention Procedure, risks and benefits explained to the patient All questions answered Patient wishes to proceed We discussed reperfusion syndrome and how this may occur after the procedure  2. Tobacco abuse - Stable We had a discussion for approximately three minutes regarding the absolute need for smoking cessation due to the deleterious nature of tobacco on the vascular system. We discussed the tobacco use would diminish patency of any intervention, and likely significantly worsen progressio of disease. We discussed multiple agents for quitting including replacement therapy or medications to reduce cravings such as Chantix. The patient voices their understanding of the importance of smoking cessation.  3. Chronic renal disease, stage 3, moderately decreased glomerular filtration rate (GFR) between 30-59 mL/min/1.73 square meter (HCC) - Stable Encouraged good HTN control as its slows the progression of renal disease  Current Outpatient Medications on File Prior to Visit  Medication Sig Dispense Refill  . mirtazapine (REMERON SOL-TAB) 15 MG disintegrating tablet Take 15 mg by mouth at bedtime.    . Multiple Vitamin (MULTIVITAMIN) capsule Take 1 capsule by mouth daily.    Marland Kitchen oxyCODONE ER (XTAMPZA ER) 13.5 MG C12A Xtampza ER 13.5 mg capsule sprinkle    . Oxycodone HCl 10 MG TABS Take 1-2 tablets (10-20 mg total) by mouth every 4 (four) hours as needed (pain). Post-operatively 40 tablet 0   No current facility-administered medications on file prior to visit.    There are no Patient Instructions on file for this visit. No follow-ups on file.  KIMBERLY A STEGMAYER, [name]

## 2018-10-02 ENCOUNTER — Encounter (INDEPENDENT_AMBULATORY_CARE_PROVIDER_SITE_OTHER): Payer: Self-pay

## 2018-10-14 ENCOUNTER — Other Ambulatory Visit (INDEPENDENT_AMBULATORY_CARE_PROVIDER_SITE_OTHER): Payer: Self-pay | Admitting: Nurse Practitioner

## 2018-10-16 ENCOUNTER — Other Ambulatory Visit (INDEPENDENT_AMBULATORY_CARE_PROVIDER_SITE_OTHER): Payer: Self-pay | Admitting: Nurse Practitioner

## 2018-10-16 ENCOUNTER — Encounter
Admission: RE | Admit: 2018-10-16 | Discharge: 2018-10-16 | Disposition: A | Discharge status: Home / Self Care | Payer: Medicare HMO | Source: Ambulatory Visit | Attending: Vascular Surgery | Admitting: Vascular Surgery

## 2018-10-16 DIAGNOSIS — Z01812 Encounter for preprocedural laboratory examination: Secondary | ICD-10-CM | POA: Diagnosis present

## 2018-10-16 HISTORY — DX: Cardiac murmur, unspecified: R01.1

## 2018-10-16 LAB — BUN: BUN: 31 mg/dL — AB (ref 8–23)

## 2018-10-16 LAB — CREATININE, SERUM
Creatinine, Ser: 1.96 mg/dL — ABNORMAL HIGH (ref 0.61–1.24)
GFR calc Af Amer: 37 mL/min — ABNORMAL LOW (ref >60)
GFR calc non Af Amer: 32 mL/min — ABNORMAL LOW (ref >60)

## 2018-10-18 MED ORDER — CLINDAMYCIN PHOSPHATE 300 MG/50ML IV SOLN
300.0000 mg | Freq: Once | INTRAVENOUS | Status: AC
  Administered 2018-10-19: 11:00:00 via INTRAVENOUS

## 2018-10-19 ENCOUNTER — Ambulatory Visit
Admission: RE | Admit: 2018-10-19 | Discharge: 2018-10-19 | Disposition: A | Discharge status: Home / Self Care | Payer: Medicare HMO | Source: Ambulatory Visit | Attending: Vascular Surgery | Admitting: Vascular Surgery

## 2018-10-19 ENCOUNTER — Encounter
Admission: RE | Disposition: A | Discharge status: Home / Self Care | Payer: Self-pay | Source: Ambulatory Visit | Attending: Vascular Surgery

## 2018-10-19 ENCOUNTER — Other Ambulatory Visit: Payer: Self-pay

## 2018-10-19 DIAGNOSIS — Z880 Allergy status to penicillin: Secondary | ICD-10-CM | POA: Diagnosis not present

## 2018-10-19 DIAGNOSIS — I701 Atherosclerosis of renal artery: Secondary | ICD-10-CM

## 2018-10-19 DIAGNOSIS — I70213 Atherosclerosis of native arteries of extremities with intermittent claudication, bilateral legs: Secondary | ICD-10-CM | POA: Insufficient documentation

## 2018-10-19 DIAGNOSIS — F17210 Nicotine dependence, cigarettes, uncomplicated: Secondary | ICD-10-CM | POA: Diagnosis not present

## 2018-10-19 DIAGNOSIS — Z881 Allergy status to other antibiotic agents status: Secondary | ICD-10-CM | POA: Insufficient documentation

## 2018-10-19 DIAGNOSIS — I70219 Atherosclerosis of native arteries of extremities with intermittent claudication, unspecified extremity: Secondary | ICD-10-CM

## 2018-10-19 DIAGNOSIS — N183 Chronic kidney disease, stage 3 (moderate): Secondary | ICD-10-CM | POA: Insufficient documentation

## 2018-10-19 DIAGNOSIS — Z800 Family history of malignant neoplasm of digestive organs: Secondary | ICD-10-CM | POA: Insufficient documentation

## 2018-10-19 DIAGNOSIS — Z888 Allergy status to other drugs, medicaments and biological substances status: Secondary | ICD-10-CM | POA: Insufficient documentation

## 2018-10-19 DIAGNOSIS — E7800 Pure hypercholesterolemia, unspecified: Secondary | ICD-10-CM | POA: Diagnosis not present

## 2018-10-19 DIAGNOSIS — Z87442 Personal history of urinary calculi: Secondary | ICD-10-CM | POA: Diagnosis not present

## 2018-10-19 DIAGNOSIS — Z8551 Personal history of malignant neoplasm of bladder: Secondary | ICD-10-CM | POA: Insufficient documentation

## 2018-10-19 DIAGNOSIS — Z9889 Other specified postprocedural states: Secondary | ICD-10-CM | POA: Insufficient documentation

## 2018-10-19 HISTORY — PX: LOWER EXTREMITY ANGIOGRAPHY: CATH118251

## 2018-10-19 SURGERY — LOWER EXTREMITY ANGIOGRAPHY
Anesthesia: Moderate Sedation | Laterality: Right

## 2018-10-19 MED ORDER — MIDAZOLAM HCL 2 MG/2ML IJ SOLN
INTRAMUSCULAR | Status: DC | PRN
  Administered 2018-10-19 (×3): 2 mg via INTRAVENOUS

## 2018-10-19 MED ORDER — HEPARIN SODIUM (PORCINE) 1000 UNIT/ML IJ SOLN
INTRAMUSCULAR | Status: AC
  Filled 2018-10-19: qty 1

## 2018-10-19 MED ORDER — MIDAZOLAM HCL 2 MG/2ML IJ SOLN
INTRAMUSCULAR | Status: AC
  Filled 2018-10-19: qty 4

## 2018-10-19 MED ORDER — FENTANYL CITRATE (PF) 100 MCG/2ML IJ SOLN
INTRAMUSCULAR | Status: AC
  Filled 2018-10-19: qty 2

## 2018-10-19 MED ORDER — ATORVASTATIN CALCIUM 10 MG PO TABS: 10.0000 mg | ORAL_TABLET | Freq: Every day | ORAL | 11 refills | Status: AC

## 2018-10-19 MED ORDER — SODIUM CHLORIDE 0.9 % IV SOLN: INTRAVENOUS | Status: DC

## 2018-10-19 MED ORDER — IOPAMIDOL (ISOVUE-300) INJECTION 61%
INTRAVENOUS | Status: DC | PRN
  Administered 2018-10-19: 90 mL via INTRAVENOUS

## 2018-10-19 MED ORDER — SODIUM CHLORIDE 0.9 % IV BOLUS
250.0000 mL | Freq: Once | INTRAVENOUS | Status: AC
  Administered 2018-10-19: 1000 mL via INTRAVENOUS

## 2018-10-19 MED ORDER — FENTANYL CITRATE (PF) 100 MCG/2ML IJ SOLN
INTRAMUSCULAR | Status: DC | PRN
  Administered 2018-10-19 (×4): 50 ug via INTRAVENOUS

## 2018-10-19 MED ORDER — ACETAMINOPHEN 325 MG PO TABS: 650.0000 mg | ORAL_TABLET | ORAL | Status: DC | PRN

## 2018-10-19 MED ORDER — OXYCODONE HCL 5 MG PO TABS
10.0000 mg | ORAL_TABLET | Freq: Once | ORAL | Status: AC
  Administered 2018-10-19: 10 mg via ORAL

## 2018-10-19 MED ORDER — HYDROMORPHONE HCL 1 MG/ML IJ SOLN
INTRAMUSCULAR | Status: AC
  Filled 2018-10-19: qty 0.5

## 2018-10-19 MED ORDER — LIDOCAINE-EPINEPHRINE (PF) 1 %-1:200000 IJ SOLN
INTRAMUSCULAR | Status: AC
  Filled 2018-10-19: qty 30

## 2018-10-19 MED ORDER — MIDAZOLAM HCL 2 MG/2ML IJ SOLN
INTRAMUSCULAR | Status: AC
  Filled 2018-10-19: qty 2

## 2018-10-19 MED ORDER — SODIUM CHLORIDE 0.9% FLUSH: 3.0000 mL | INTRAVENOUS | Status: DC | PRN

## 2018-10-19 MED ORDER — ASPIRIN EC 81 MG PO TBEC: 81.0000 mg | DELAYED_RELEASE_TABLET | Freq: Every day | ORAL | Status: DC

## 2018-10-19 MED ORDER — ASPIRIN EC 81 MG PO TBEC: 81.0000 mg | DELAYED_RELEASE_TABLET | Freq: Every day | ORAL | 2 refills | Status: DC

## 2018-10-19 MED ORDER — HYDROCOD POLST-CPM POLST ER 10-8 MG/5ML PO SUER
ORAL | Status: AC
  Administered 2018-10-19: 5 mL via ORAL
  Filled 2018-10-19: qty 5

## 2018-10-19 MED ORDER — CLINDAMYCIN PHOSPHATE 300 MG/50ML IV SOLN
INTRAVENOUS | Status: AC
  Filled 2018-10-19: qty 50

## 2018-10-19 MED ORDER — SODIUM CHLORIDE 0.9% FLUSH: 3.0000 mL | Freq: Two times a day (BID) | INTRAVENOUS | Status: DC

## 2018-10-19 MED ORDER — OXYCODONE HCL 5 MG PO TABS
ORAL_TABLET | ORAL | Status: AC
  Filled 2018-10-19: qty 2

## 2018-10-19 MED ORDER — SODIUM CHLORIDE 0.9 % IV SOLN: Freq: Once | INTRAVENOUS | Status: AC

## 2018-10-19 MED ORDER — HYDRALAZINE HCL 20 MG/ML IJ SOLN: 5.0000 mg | INTRAMUSCULAR | Status: DC | PRN

## 2018-10-19 MED ORDER — ONDANSETRON HCL 4 MG/2ML IJ SOLN: 4.0000 mg | Freq: Four times a day (QID) | INTRAMUSCULAR | Status: DC | PRN

## 2018-10-19 MED ORDER — CLOPIDOGREL BISULFATE 75 MG PO TABS: 75.0000 mg | ORAL_TABLET | Freq: Every day | ORAL | 11 refills | Status: DC

## 2018-10-19 MED ORDER — HYDROCOD POLST-CPM POLST ER 10-8 MG/5ML PO SUER
5.0000 mL | Freq: Once | ORAL | Status: AC
  Administered 2018-10-19: 5 mL via ORAL

## 2018-10-19 MED ORDER — SODIUM CHLORIDE 0.9 % IV SOLN: 250.0000 mL | INTRAVENOUS | Status: DC | PRN

## 2018-10-19 MED ORDER — HYDROMORPHONE HCL 1 MG/ML IJ SOLN
1.0000 mg | Freq: Once | INTRAMUSCULAR | Status: AC | PRN
  Administered 2018-10-19: 0.5 mg via INTRAVENOUS

## 2018-10-19 MED ORDER — LABETALOL HCL 5 MG/ML IV SOLN: 10.0000 mg | INTRAVENOUS | Status: DC | PRN

## 2018-10-19 MED ORDER — ATORVASTATIN CALCIUM 20 MG PO TABS
10.0000 mg | ORAL_TABLET | Freq: Every day | ORAL | Status: DC
  Filled 2018-10-19: qty 0.5

## 2018-10-19 MED ORDER — HEPARIN SODIUM (PORCINE) 1000 UNIT/ML IJ SOLN
INTRAMUSCULAR | Status: DC | PRN
  Administered 2018-10-19: 4000 [IU] via INTRAVENOUS

## 2018-10-19 MED ORDER — CLOPIDOGREL BISULFATE 75 MG PO TABS: 75.0000 mg | ORAL_TABLET | Freq: Every day | ORAL | Status: DC

## 2018-10-19 SURGICAL SUPPLY — 27 items
BALLN DORADO 6X40X80 (BALLOONS) ×3
BALLN DORADO 8X40X80 (BALLOONS) ×3
BALLN LUTONIX 5X120X130 (BALLOONS) ×3
BALLN LUTONIX 5X150X130 (BALLOONS) ×3
BALLN LUTONIX DCB 6X40X130 (BALLOONS) ×3
BALLOON DORADO 6X40X80 (BALLOONS) ×1 IMPLANT
BALLOON DORADO 8X40X80 (BALLOONS) IMPLANT
BALLOON LUTONIX 5X120X130 (BALLOONS) IMPLANT
BALLOON LUTONIX 5X150X130 (BALLOONS) ×1 IMPLANT
BALLOON LUTONIX DCB 6X40X130 (BALLOONS) ×1 IMPLANT
CATH BEACON 5 .035 40 KMP TP (CATHETERS) ×1 IMPLANT
CATH BEACON 5 .035 65 RIM TIP (CATHETERS) ×3 IMPLANT
CATH BEACON 5 .038 40 KMP TP (CATHETERS) ×2
CATH PIG 70CM (CATHETERS) ×3 IMPLANT
DEVICE PRESTO INFLATION (MISCELLANEOUS) ×3 IMPLANT
DEVICE STARCLOSE SE CLOSURE (Vascular Products) ×2 IMPLANT
GLIDEWIRE ADV .035X180CM (WIRE) ×3 IMPLANT
PACK ANGIOGRAPHY (CUSTOM PROCEDURE TRAY) ×3 IMPLANT
SHEATH BRITE TIP 5FRX11 (SHEATH) ×3 IMPLANT
SHEATH BRITE TIP 6FRX11 (SHEATH) ×2 IMPLANT
SHEATH PINNACLE ST 6F 45CM (SHEATH) ×3 IMPLANT
STENT LIFESTAR 7X80X80 (Permanent Stent) ×2 IMPLANT
STENT LIFESTREAM 6X26X80 (Permanent Stent) ×2 IMPLANT
STENT LIFESTREAM 6X37X80 (Permanent Stent) ×2 IMPLANT
TUBING CONTRAST HIGH PRESS 72 (TUBING) ×2 IMPLANT
WIRE J 3MM .035X145CM (WIRE) ×3 IMPLANT
WIRE MAGIC TORQUE 260C (WIRE) ×3 IMPLANT

## 2018-10-19 NOTE — Op Note (Addendum)
Jeffery Ponce  Percutaneous Study/Intervention Procedural Note   Date of Surgery: 10/19/2018  Surgeon(s):Azalea Cedar    Assistants:none  Pre-operative Diagnosis: PAD with claudication BLE  Post-operative diagnosis:  Same  Procedure(s) Performed:             1.  Ultrasound guidance for vascular access left femoral artery             2.  Catheter placement into right SFA from left femoral approach             3.  Aortogram and selective right lower extremity angiogram             4.  Percutaneous transluminal angioplasty of left common iliac artery with 6 mm diameter drug-coated and 6 mm diameter high-pressure angioplasty balloon             5.   Percutaneous transluminal angioplasty of the right SFA with 5 mm diameter by 15 cm length Lutonix drug-coated angioplasty balloon  6.  Percutaneous transluminal angioplasty of the right external iliac artery and common femoral artery with 5 mm diameter by 12 cm length Lutonix drug-coated angioplasty balloon  7.  Self-expanding stent placement to the right external iliac artery with 7 mm diameter by 8 cm length life star stent  8.  Covered stent placement to the right common iliac artery with 7 mm 37 mm length lifestream stent postdilated with an 8 mm balloon  9.  Covered stent placement to the left common iliac artery with 7 mm diameter by 26 mm length lifestream stent postdilated with an 8 mm balloon for greater than 50% residual stenosis after angioplasty             10.  StarClose closure device left femoral artery  EBL: 5 cc  Contrast: 90 cc  Fluoro Time: 7.9 minutes  Moderate Conscious Sedation Time: approximately 50 minutes using 6 mg of Versed and 200 mcg of Fentanyl              Indications:  Patient is a 72 y.o.male with stable and claudication symptoms of both lower extremities. The patient has noninvasive study showing the eyes and the 0.5 range bilaterally. The patient is brought in for angiography for further  evaluation and potential treatment. Risks and benefits are discussed and informed consent is obtained.   Procedure:  The patient was identified and appropriate procedural time out was performed.  The patient was then placed supine on the table and prepped and draped in the usual sterile fashion. Moderate conscious sedation was administered during a face to face encounter with the patient throughout the procedure with my supervision of the RN administering medicines and monitoring the patient's vital signs, pulse oximetry, telemetry and mental status throughout from the start of the procedure until the patient was taken to the recovery room. Ultrasound was used to evaluate the left common femoral artery.  It was patent but heavily diseased.  A digital ultrasound image was acquired.  A Seldinger needle was used to access the left common femoral artery under direct ultrasound guidance and a permanent image was performed.  A 0.035 J wire was advanced without resistance and a 5Fr sheath was placed.  Pigtail catheter was placed into the aorta and an AP aortogram was performed. This demonstrated normal left renal artery and what appeared to be greater than 60% stenosis of the right renal artery.  The aorta was calcific but not stenotic.  The right common iliac artery had about  a 60% calcific stenosis, the right hypogastric artery was occluded, and the right external iliac artery had 85 to 90% stenosis.  The left common iliac artery had a near occlusive stenosis about 1 cm beyond the origin.  The left external iliac artery did not have any greater than 50% stenosis. I then crossed the aortic bifurcation with a RIM catheter and advanced to the right femoral head and then into the right proximal SFA to opacify better distally. Selective right lower extremity angiogram was then performed. This demonstrated disease of the common femoral artery that appeared to be at least 50% stenotic in nature.  The profunda femoris artery  did not have any disease beyond its origin although it was difficult to opacify even in a steep RAO angle.  The SFA was patent proximally, but in the mid to distal segment had 80 to 90% stenosis.  The popliteal artery then normalized.  There was then two-vessel runoff with both the anterior tibial and posterior tibial arteries distally. It was felt that it was in the patient's best interest to proceed with intervention after these images to avoid a second procedure and a larger amount of contrast and fluoroscopy based off of the findings from the initial angiogram. The patient was systemically heparinized and I started by performing angioplasty of the nearly occlusive stenosis of the left common iliac artery before putting it up and over sheath down the right side.  A 6 mm diameter by 4 cm length Lutonix drug-coated angioplasty balloon was used but this burst before it burst inflation pressure.  I then used a 6 mm diameter by 4 cm length high-pressure angioplasty balloon and inflated this to 32 atm for 1 minute.  There is some improvement but still greater than 50% residual stenosis in this location and we would place a stent there on the way out.  A 6 French Destination sheath was then placed over the [employer] wire. I then used a Kumpe catheter and the advantage wire to navigate down across the SFA stenosis and into the popliteal artery.  I then exchanged for a Magic torque wire.  The SFA lesion was treated with a 5 mm diameter by 15 cm length Lutonix drug-coated angioplasty balloon inflated to 12 atm for 1 minute.  Completion imaging followed this inflation and showed only about a 15 to 20% residual stenosis in the right SFA.  I then turned my attention to the iliac and common femoral disease.  A 5 mm diameter by 12 cm length Lutonix drug-coated angioplasty balloon was inflated through the right external iliac and common femoral artery.  This was taken to 10 atm for 1 minute.  Completion imaging showed  greater than 50% residual stenosis throughout the right external iliac artery but the common femoral lesion now appeared to be less than 40%.  A 7 mm diameter by 8 cm length life star stent was then deployed in the right external iliac artery and postdilated with a 6 mm diameter high-pressure angioplasty balloon with less than 10% residual stenosis for the right common iliac lesion which was highly calcific, a 7 mm diameter by 37 mm length lifestream stent was deployed and inflated to burst pressure.  There was still some mild constraints so and upsized to an 8 mm diameter high-pressure angioplasty balloon and took this up over 16 atm with less than 20% residual stenosis present after right common iliac stent placement.  The left common iliac lesion that had already been treated with angioplasty with  suboptimal result was then treated with a 7 mm diameter by 26 mm length lifestream stent postdilated with an 8 mm balloon with about a 20 to 30% residual stenosis present after stent placement. I elected to terminate the procedure. The sheath was removed and StarClose closure device was deployed in the left femoral artery with excellent hemostatic result. The patient was taken to the recovery room in stable condition having tolerated the procedure well.  Findings:               Aortogram:  This demonstrated normal left renal artery and what appeared to be greater than 60% stenosis of the right renal artery.  The aorta was calcific but not stenotic.  The right common iliac artery had about a 60% calcific stenosis, the right hypogastric artery was occluded, and the right external iliac artery had 85 to 90% stenosis.  The left common iliac artery had a near occlusive stenosis about 1 cm beyond the origin.  The left external iliac artery did not have any greater than 50% stenosis             Right lower Extremity:  Disease of the common femoral artery that appeared to be at least 50% stenotic in nature.  The profunda  femoris artery did not have any disease beyond its origin although it was difficult to opacify even in a steep RAO angle.  The SFA was patent proximally, but in the mid to distal segment had 80 to 90% stenosis.  The popliteal artery then normalized.  There was then two-vessel runoff with both the anterior tibial and posterior tibial arteries distally   Disposition: Patient was taken to the recovery room in stable condition having tolerated the procedure well.  Complications: None  Jeffery Ponce 10/19/2018 12:14 PM   This note was created with Dragon Medical transcription system. Any errors in dictation are purely unintentional.

## 2018-10-19 NOTE — H&P (Signed)
[facility] History & Physical Update  The patient was interviewed and re-examined.  The patient's previous History and Physical has been reviewed and is unchanged.  There is no change in the plan of care. We plan to proceed with the scheduled procedure.  Leotis Pain, MD  10/19/2018, 9:59 AM

## 2018-10-19 NOTE — Discharge Instructions (Signed)

## 2018-10-20 ENCOUNTER — Ambulatory Visit (HOSPITAL_COMMUNITY)
Admission: RE | Admit: 2018-10-20 | Discharge: 2018-10-20 | Disposition: A | Discharge status: Home / Self Care | Payer: Medicare HMO | Source: Ambulatory Visit | Attending: Internal Medicine | Admitting: Internal Medicine

## 2018-10-20 ENCOUNTER — Inpatient Hospital Stay (HOSPITAL_COMMUNITY): Payer: Medicare HMO | Attending: Hematology

## 2018-10-20 ENCOUNTER — Encounter: Payer: Self-pay | Admitting: Vascular Surgery

## 2018-10-20 DIAGNOSIS — Z95828 Presence of other vascular implants and grafts: Secondary | ICD-10-CM | POA: Insufficient documentation

## 2018-10-20 DIAGNOSIS — Z800 Family history of malignant neoplasm of digestive organs: Secondary | ICD-10-CM | POA: Insufficient documentation

## 2018-10-20 DIAGNOSIS — Z23 Encounter for immunization: Secondary | ICD-10-CM | POA: Diagnosis not present

## 2018-10-20 DIAGNOSIS — K5730 Diverticulosis of large intestine without perforation or abscess without bleeding: Secondary | ICD-10-CM | POA: Diagnosis not present

## 2018-10-20 DIAGNOSIS — R918 Other nonspecific abnormal finding of lung field: Secondary | ICD-10-CM | POA: Diagnosis not present

## 2018-10-20 DIAGNOSIS — F17210 Nicotine dependence, cigarettes, uncomplicated: Secondary | ICD-10-CM | POA: Diagnosis not present

## 2018-10-20 DIAGNOSIS — K8020 Calculus of gallbladder without cholecystitis without obstruction: Secondary | ICD-10-CM | POA: Diagnosis not present

## 2018-10-20 DIAGNOSIS — I700 Atherosclerosis of aorta: Secondary | ICD-10-CM | POA: Diagnosis not present

## 2018-10-20 DIAGNOSIS — C679 Malignant neoplasm of bladder, unspecified: Secondary | ICD-10-CM | POA: Diagnosis not present

## 2018-10-20 DIAGNOSIS — Z79899 Other long term (current) drug therapy: Secondary | ICD-10-CM | POA: Diagnosis not present

## 2018-10-20 DIAGNOSIS — J439 Emphysema, unspecified: Secondary | ICD-10-CM | POA: Insufficient documentation

## 2018-10-20 DIAGNOSIS — R911 Solitary pulmonary nodule: Secondary | ICD-10-CM | POA: Diagnosis present

## 2018-10-20 DIAGNOSIS — Z9221 Personal history of antineoplastic chemotherapy: Secondary | ICD-10-CM | POA: Diagnosis not present

## 2018-10-20 DIAGNOSIS — Z7982 Long term (current) use of aspirin: Secondary | ICD-10-CM | POA: Diagnosis not present

## 2018-10-20 LAB — CBC WITH DIFFERENTIAL/PLATELET
ABS IMMATURE GRANULOCYTES: 0.08 10*3/uL — AB (ref 0.00–0.07)
Basophils Absolute: 0.1 10*3/uL (ref 0.0–0.1)
Basophils Relative: 0 %
EOS ABS: 0.3 10*3/uL (ref 0.0–0.5)
Eosinophils Relative: 2 %
HEMATOCRIT: 44.1 % (ref 39.0–52.0)
Hemoglobin: 13.6 g/dL (ref 13.0–17.0)
IMMATURE GRANULOCYTES: 1 %
LYMPHS ABS: 2.6 10*3/uL (ref 0.7–4.0)
Lymphocytes Relative: 18 %
MCH: 29.2 pg (ref 26.0–34.0)
MCHC: 30.8 g/dL (ref 30.0–36.0)
MCV: 94.8 fL (ref 80.0–100.0)
MONOS PCT: 8 %
Monocytes Absolute: 1.2 10*3/uL — ABNORMAL HIGH (ref 0.1–1.0)
NEUTROS ABS: 10.8 10*3/uL — AB (ref 1.7–7.7)
NEUTROS PCT: 71 %
Platelets: 335 10*3/uL (ref 150–400)
RBC: 4.65 MIL/uL (ref 4.22–5.81)
RDW: 13.2 % (ref 11.5–15.5)
WBC: 15.1 10*3/uL — ABNORMAL HIGH (ref 4.0–10.5)
nRBC: 0 % (ref 0.0–0.2)

## 2018-10-20 LAB — COMPREHENSIVE METABOLIC PANEL
ALK PHOS: 112 U/L (ref 38–126)
ALT: 9 U/L (ref 0–44)
AST: 13 U/L — AB (ref 15–41)
Albumin: 4.3 g/dL (ref 3.5–5.0)
Anion gap: 9 (ref 5–15)
BILIRUBIN TOTAL: 0.5 mg/dL (ref 0.3–1.2)
BUN: 27 mg/dL — AB (ref 8–23)
CO2: 22 mmol/L (ref 22–32)
CREATININE: 1.91 mg/dL — AB (ref 0.61–1.24)
Calcium: 9.6 mg/dL (ref 8.9–10.3)
Chloride: 107 mmol/L (ref 98–111)
GFR calc Af Amer: 38 mL/min — ABNORMAL LOW (ref >60)
GFR, EST NON AFRICAN AMERICAN: 33 mL/min — AB (ref >60)
Glucose, Bld: 108 mg/dL — ABNORMAL HIGH (ref 70–99)
Potassium: 4.3 mmol/L (ref 3.5–5.1)
Sodium: 138 mmol/L (ref 135–145)
TOTAL PROTEIN: 8.4 g/dL — AB (ref 6.5–8.1)

## 2018-10-20 LAB — LACTATE DEHYDROGENASE: LDH: 119 U/L (ref 98–192)

## 2018-10-20 MED ORDER — IOPAMIDOL (ISOVUE-300) INJECTION 61%
80.0000 mL | Freq: Once | INTRAVENOUS | Status: AC | PRN
  Administered 2018-10-20: 80 mL via INTRAVENOUS

## 2018-10-21 ENCOUNTER — Encounter (HOSPITAL_COMMUNITY): Payer: Self-pay | Admitting: Hematology

## 2018-10-21 ENCOUNTER — Other Ambulatory Visit: Payer: Self-pay

## 2018-10-21 ENCOUNTER — Inpatient Hospital Stay (HOSPITAL_BASED_OUTPATIENT_CLINIC_OR_DEPARTMENT_OTHER): Payer: Medicare HMO | Admitting: Hematology

## 2018-10-21 VITALS — BP 158/49 | HR 57 | Resp 20 | Wt 156.5 lb

## 2018-10-21 DIAGNOSIS — C679 Malignant neoplasm of bladder, unspecified: Secondary | ICD-10-CM | POA: Diagnosis not present

## 2018-10-21 DIAGNOSIS — Z7982 Long term (current) use of aspirin: Secondary | ICD-10-CM

## 2018-10-21 DIAGNOSIS — Z79899 Other long term (current) drug therapy: Secondary | ICD-10-CM

## 2018-10-21 DIAGNOSIS — Z23 Encounter for immunization: Secondary | ICD-10-CM

## 2018-10-21 DIAGNOSIS — R918 Other nonspecific abnormal finding of lung field: Secondary | ICD-10-CM

## 2018-10-21 DIAGNOSIS — F17210 Nicotine dependence, cigarettes, uncomplicated: Secondary | ICD-10-CM

## 2018-10-21 DIAGNOSIS — Z9221 Personal history of antineoplastic chemotherapy: Secondary | ICD-10-CM

## 2018-10-21 DIAGNOSIS — Z800 Family history of malignant neoplasm of digestive organs: Secondary | ICD-10-CM

## 2018-10-21 MED ORDER — INFLUENZA VAC SPLIT HIGH-DOSE 0.5 ML IM SUSY
0.5000 mL | PREFILLED_SYRINGE | Freq: Once | INTRAMUSCULAR | Status: AC
  Administered 2018-10-21: 0.5 mL via INTRAMUSCULAR
  Filled 2018-10-21: qty 0.5

## 2018-10-21 NOTE — Assessment & Plan Note (Signed)
1.  Bladder cancer: - Underwent TURBT for a T2N0 urothelial bladder cancer on 11/19/2016. -As there was suspicion for muscle invasion, underwent cisplatin and gemcitabine 4 cycles from 12/30/2016 through 04/21/2017 followed by cystoprostatectomy on 07/30/2017, showing residual disease of 0.4 cm. - He has been on surveillance since then.  Denies any new onset pains.  Denies any weight loss. - We have reviewed CT scan results dated 10/20/2018 which showed no active malignancy.  Peripancreatic node measuring 10 mm in short axis has been stable since December 2017.  Chronic mild nodularity in the lungs is also stable. - We will see him back in 6 months for follow-up with repeat imaging.

## 2018-10-22 ENCOUNTER — Ambulatory Visit (HOSPITAL_COMMUNITY): Payer: Medicare HMO | Admitting: Hematology

## 2018-10-23 ENCOUNTER — Encounter (HOSPITAL_COMMUNITY): Payer: Self-pay | Admitting: Hematology

## 2018-10-23 ENCOUNTER — Other Ambulatory Visit (INDEPENDENT_AMBULATORY_CARE_PROVIDER_SITE_OTHER): Payer: Self-pay | Admitting: Nurse Practitioner

## 2018-10-23 NOTE — Progress Notes (Signed)
[place], [place] [id number]   CLINIC:  Medical Oncology/Hematology  PCP:  [facility] [address] [place] [id number] [phone]   REASON FOR VISIT:  Follow-up for bladder cancer.  CURRENT THERAPY: Surveillance.  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 Correlate with tissue sampling on cystoscopy.  No findings suspicious for upper tract disease.  Cholelithiasis, without associated inflammatory changes.    11/19/2016 Procedure    TURBT by [name] of a 2 centimeter anterior bladder wall tumor, placement of epirubicin intravesical    11/19/2016 Procedure    50 milligrams of epirubicin and 50 mL of diluent.  This was left indwelling for 1 hour by [name]    11/22/2016 Pathology Results    Bladder, transurethral resection, bladder tumor INFILTRATING HIGH GRADE UROTHELIAL CARCINOMA THE CARCINOMA INVADES MUSCULARIS PROPRIA (DETRUSOR MUSCLE) Microscopic Comment The neoplasm stains positive for high molecular weight cytokeratin, p63 and negative for prostein. The immunostain pattern supports the diagnosis of urothelial carcinoma.    12/18/2016 Imaging    Bilateral interstitial prominence. Active pneumonitis cannot be excluded. These changes could be also be related chronic interstitial lung disease.    12/30/2016 - 04/28/2017 Chemotherapy    The patient had palonosetron (ALOXI) injection 0.25 mg, 0.25 mg, Intravenous,  Once, 4 of 4 cycles  CISplatin (PLATINOL) 138 mg in sodium chloride 0.9 % 500 mL chemo infusion, 70 mg/m2 = 138 mg, Intravenous,  Once, 4 of 4 cycles  gemcitabine (GEMZAR) 1,976 mg in sodium chloride 0.9 % 250 mL chemo infusion, 1,000 mg/m2 = 1,976 mg, Intravenous,  Once,  4 of 4 cycles Dose modification: 800 mg/m2 (80 % of original dose 1,000 mg/m2, Cycle 3, Reason: Provider Judgment, Comment: Thrombocytopenia)  fosaprepitant (EMEND) 150 mg, dexamethasone (DECADRON) 12 mg in sodium chloride 0.9 % 145 mL IVPB, , Intravenous,  Once, 4 of 4 cycles  for chemotherapy treatment.      02/17/2017 Treatment Plan Change    Treatment deferred x 7 days due to thrombocytopenia    02/17/2017 Treatment Plan Change    Gemcitabine dose reduced by 20% due to thrombocytopenia    03/10/2017 Treatment Plan Change    Cisplatin dose-reduced by 20%    04/28/2017 Treatment Plan Change    Day 15 of cycle #4 is cancelled due to thrombocytopenia (68,000).    05/02/2017 Imaging    CT abd/pelvis at [facility]-no evidence of metastatic disease or recurrent focal urothelial lesion on noncontrast imaging.  There is mild diffuse bladder wall thickening.  Urothelial evaluation limited without contrast.  Cholelithiasis, sigmoid diverticulosis, aortic atherosclerosis.    07/30/2017 Definitive Surgery    Cystoprostatectomy by [name] with curative intent.    07/30/2017 Pathology Results    Specimen: Bladder and prostate Procedure: Cystoprostatectomy Tumor site (if known): anterior wall Maximum tumor size (cm): 0.4 cm Histology: urothelial carcinoma Grade: high grade Microscopic tumor extension: deeper muscularis propria Lymph - Vascular invasion: Negative Involvement of adjacent organs/structures: Negative Additional epithelial lesions: Carcinoma in situ Margins: negative Lymph nodes: number examined 15 ; number positive 0 TNM code: ypT2b, ypN 0,    07/30/2017 Cancer Staging    Cancer Staging Urothelial carcinoma of bladder [facility]) Staging form: Urinary Bladder, AJCC 8th Edition - Clinical stage from 12/05/2016:  Stage IIIA (cT3, cN0, cM0) - Signed by [name], [name] on 12/18/2016 - Pathologic stage from 12/10/2016: pT2, pN0, cM0 - Signed by [name], [name] on  12/10/2016 - Pathologic stage from 07/30/2017: Stage II (ypT2b, pN0, cM0) - Signed by [name], NP on 09/09/2017     11/26/2017 Imaging    CT abd./pelvis- 1. Several mildly enlarged lymph nodes are present including a 1.2 cm peripancreatic lymph node and a 1.4 cm low-density left external iliac lymph node or postoperative fluid collection. These merit surveillance. 2. A right middle lobe subpleural nodule along the minor fissure as a volume of 100 cubic mm. This level has not been included in prior cross-sectional imaging to assess for stability. Guidelines for follow up do not specifically apply due to history of malignancy. Well likely a benign subpleural lymph node, surveillance is likely warranted. 3. Other imaging findings of potential clinical significance: Aortic Atherosclerosis (ICD10-I70.0) and Emphysema (ICD10-J43.9). Coronary atherosclerosis with mitral and aortic valve calcification. Borderline appearance for distal esophageal wall thickening, query reflux. Several small left lower lobe nodules in the 3-4 mm range are probably inflammatory. Cholelithiasis. Cystoprostatectomy with loop ileostomy. Sigmoid diverticulosis. Notable common and external iliac stenosis bilaterally due to atherosclerosis. Lumbar impingement at L2- 3, L3-4, and L4-5.      CANCER STAGING: Cancer Staging Malignant papillary carcinoma of bladder [facility]) Staging form: Urinary Bladder, AJCC 8th Edition - Clinical stage from 12/05/2016: Stage IIIA (cT3, cN0, cM0) - Signed by [name], [name] on 12/18/2016 - Pathologic stage from 12/10/2016: pT2, pN0, cM0 - Signed by [name], [name] on 12/10/2016 - Pathologic stage from 07/30/2017: Stage II (ypT2b, pN0, cM0) - Signed by [name], NP on 09/09/2017    INTERVAL HISTORY:  Mr. Abello 72 y.o. male returns for follow-up of his bladder cancer.  Denies any new onset pains.  Denies any weight loss in the last 6 months.  Denies any fevers  or night sweats.  He had a CT scan done on 10/12/2018.  Appetite and energy levels are rated at 75%.  He had stents placed in his lower extremity arteries.    REVIEW OF SYSTEMS:  Review of Systems  Gastrointestinal: Positive for constipation.  All other systems reviewed and are negative.    PAST MEDICAL/SURGICAL HISTORY:  Past Medical History:  Diagnosis Date  . Arthritis   . Chronic renal disease, stage 3, moderately decreased glomerular filtration rate (GFR) between 30-59 mL/min/1.73 square meter (HCC) 11/28/2017  . Heart murmur   . History of kidney stones   . Hypercholesteremia   . Urothelial carcinoma ([place]) 12/10/2016  . Urothelial carcinoma of bladder ([place]) 12/10/2016   Past Surgical History:  Procedure Laterality Date  . CARDIAC CATHETERIZATION    . CYSTOSCOPY WITH INJECTION N/A 07/30/2017   Procedure: CYSTOSCOPY WITH INJECTION OF INDOCYANINE GREEN DYE;  Surgeon: Alexis Frock, MD;  Location: [facility];  Service: Urology;  Laterality: N/A;  . FEMUR FRACTURE SURGERY Right   . LOWER EXTREMITY ANGIOGRAPHY Right 10/19/2018   Procedure: LOWER EXTREMITY ANGIOGRAPHY;  Surgeon: Algernon Huxley, MD;  Location: [facility];  Service: Cardiovascular;  Laterality: Right;  . MCT 3D RECONSTRUCTION ([facility] HX) Left    arm  . open reduction and internal fixation leg Right    hip and leg.  Marland Kitchen PORT-A-CATH REMOVAL N/A 07/20/2018   Procedure: REMOVAL PORT-A-CATH;  Surgeon: Aviva Signs, MD;  Location: [facility];  Service: General;  Laterality: N/A;  . PORTACATH PLACEMENT N/A  12/23/2016   Procedure: PLACEMENT OF TUNNELED CENTRAL VENOUS CATHETER RIGHT INTERNAL JUGULAR WITH SUBCUTANEOUS PORT;  Surgeon: Vickie Epley, MD;  Location: [facility];  Service: Vascular;  Laterality: N/A;  . reattatchment of left arm     from [place]  . TRANSURETHRAL RESECTION OF BLADDER TUMOR N/A 11/19/2016   Procedure: TRANSURETHRAL RESECTION OF BLADDER TUMOR (TURBT) WITH EPIRUBICIN INJECTION;  Surgeon: Franchot Gallo, MD;  Location: [facility];  Service: Urology;  Laterality: N/A;     SOCIAL HISTORY:  Social History   Socioeconomic History  . Marital status: Married    Spouse name: Not on file  . Number of children: Not on file  . Years of education: Not on file  . Highest education level: Not on file  Occupational History  . Not on file  Social Needs  . Financial resource strain: Not on file  . Food insecurity:    Worry: Not on file    Inability: Not on file  . Transportation needs:    Medical: Not on file    Non-medical: Not on file  Tobacco Use  . Smoking status: Current Every Day Smoker    Packs/day: 0.50    Years: 50.00    Pack years: 25.00    Types: Cigarettes  . Smokeless tobacco: Never Used  Substance and Sexual Activity  . Alcohol use: No    Frequency: Never  . Drug use: No  . Sexual activity: Never    Birth control/protection: None    Comment: married-30 years-2 children by first wife  Lifestyle  . Physical activity:    Days per week: Not on file    Minutes per session: Not on file  . Stress: Not on file  Relationships  . Social connections:    Talks on phone: Not on file    Gets together: Not on file    Attends religious service: Not on file    Active member of club or organization: Not on file    Attends meetings of clubs or organizations: Not on file    Relationship status: Not on file  . Intimate partner violence:    Fear of current or ex partner: Not on file    Emotionally abused: Not on file    Physically abused: Not on file    Forced sexual activity: Not on file  Other Topics Concern  . Not on file  Social History Narrative  . Not on file    FAMILY HISTORY:  Family History  Problem Relation Age of Onset  . Aneurysm Mother   . Diabetes Father   . Colon cancer Brother     CURRENT MEDICATIONS:  Outpatient Encounter Medications as of 10/21/2018  Medication Sig  . aspirin EC 81 MG tablet Take 1 tablet (81 mg total) by mouth daily.  Marland Kitchen  atorvastatin (LIPITOR) 10 MG tablet Take 1 tablet (10 mg total) by mouth daily.  . cholecalciferol (VITAMIN D) 25 MCG (1000 UT) tablet   . clopidogrel (PLAVIX) 75 MG tablet Take 1 tablet (75 mg total) by mouth daily.  . diphenhydrAMINE HCl (BENADRYL ALLERGY PO) Take by mouth at bedtime as needed.  . furosemide (LASIX) 20 MG tablet   . mirtazapine (REMERON SOL-TAB) 15 MG disintegrating tablet Take 15 mg by mouth at bedtime.  . mirtazapine (REMERON) 15 MG tablet   . morphine (MS CONTIN) 15 MG 12 hr tablet   . Multiple Vitamin (MULTIVITAMIN) capsule Take 1 capsule by mouth daily.  Marland Kitchen oxyCODONE ER (XTAMPZA ER)  13.5 MG C12A Xtampza ER 13.5 mg capsule sprinkle  . Oxycodone HCl 10 MG TABS Take 1-2 tablets (10-20 mg total) by mouth every 4 (four) hours as needed (pain). Post-operatively  . Vitamin D, Cholecalciferol, 25 MCG (1000 UT) CAPS Take 1 capsule by mouth daily.  . [EXPIRED] Influenza vac split quadrivalent PF (FLUZONE HIGH-DOSE) injection 0.5 mL    No facility-administered encounter medications on file as of 10/21/2018.     ALLERGIES:  Allergies  Allergen Reactions  . Penicillins Anaphylaxis    Has patient had a PCN reaction causing immediate rash, facial/tongue/throat swelling, SOB or lightheadedness with hypotension:Yes Has patient had a PCN reaction causing severe rash involving mucus membranes or skin necrosis:Yes Has patient had a PCN reaction that required hospitalization:No Has patient had a PCN reaction occurring within the last 10 years:No If all of the above answers are "NO", then may proceed with Cephalosporin use.   . Ciprofloxacin Itching    Itching at IV site. No hives or shortness of breathe.  . Ambien [Zolpidem Tartrate] Other (See Comments)    "Sleep walking"     PHYSICAL EXAM:  ECOG Performance status: 1  Vitals:   10/21/18 1400  BP: (!) 158/49  Pulse: (!) 57  Resp: 20  SpO2: 100%   Filed Weights   10/21/18 1400  Weight: 156 lb 8 oz (71 kg)     Physical Exam  Constitutional: He appears well-developed and well-nourished.  Cardiovascular: Regular rhythm.  Pulmonary/Chest: Breath sounds normal.  Abdominal: Soft. He exhibits no mass. There is no tenderness.  Musculoskeletal: He exhibits no edema.  Skin: No rash noted.     LABORATORY DATA:  I have reviewed the labs as listed.  CBC    Component Value Date/Time   WBC 15.1 (H) 10/20/2018 1200   RBC 4.65 10/20/2018 1200   HGB 13.6 10/20/2018 1200   HCT 44.1 10/20/2018 1200   PLT 335 10/20/2018 1200   MCV 94.8 10/20/2018 1200   MCH 29.2 10/20/2018 1200   MCHC 30.8 10/20/2018 1200   RDW 13.2 10/20/2018 1200   LYMPHSABS 2.6 10/20/2018 1200   MONOABS 1.2 (H) 10/20/2018 1200   EOSABS 0.3 10/20/2018 1200   BASOSABS 0.1 10/20/2018 1200   CMP Latest Ref Rng & Units 10/20/2018 10/16/2018 04/20/2018  Glucose 70 - 99 mg/dL 108(H) - 91  BUN 8 - 23 mg/dL 27(H) 31(H) 24(H)  Creatinine 0.61 - 1.24 mg/dL 1.91(H) 1.96(H) 1.54(H)  Sodium 135 - 145 mmol/L 138 - 137  Potassium 3.5 - 5.1 mmol/L 4.3 - 4.4  Chloride 98 - 111 mmol/L 107 - 109  CO2 22 - 32 mmol/L 22 - 22  Calcium 8.9 - 10.3 mg/dL 9.6 - 8.9  Total Protein 6.5 - 8.1 g/dL 8.4(H) - 6.9  Total Bilirubin 0.3 - 1.2 mg/dL 0.5 - 0.4  Alkaline Phos 38 - 126 U/L 112 - 93  AST 15 - 41 U/L 13(L) - 13(L)  ALT 0 - 44 U/L 9 - 10(L)       DIAGNOSTIC IMAGING:  I have independently reviewed images of CT scan dated 10/28/2018 and discussed with the patient.     ASSESSMENT & PLAN:   Bladder cancer ([place]) 1.  Bladder cancer: - Underwent TURBT for a T2N0 urothelial bladder cancer on 11/19/2016. -As there was suspicion for muscle invasion, underwent cisplatin and gemcitabine 4 cycles from 12/30/2016 through 04/21/2017 followed by cystoprostatectomy on 07/30/2017, showing residual disease of 0.4 cm. - He has been on surveillance since then.  Denies  any new onset pains.  Denies any weight loss. - We have reviewed CT scan results dated  10/20/2018 which showed no active malignancy.  Peripancreatic node measuring 10 mm in short axis has been stable since December 2017.  Chronic mild nodularity in the lungs is also stable. - We will see him back in 6 months for follow-up with repeat imaging.      Orders placed this encounter:  Orders Placed This Encounter  Procedures  . CT Chest W Contrast  . CT Abdomen Pelvis W Contrast  . Comprehensive metabolic panel  . CBC with Differential      Derek Jack, MD [place] [phone]

## 2018-10-25 MED ORDER — CLINDAMYCIN PHOSPHATE 300 MG/50ML IV SOLN: 300.0000 mg | Freq: Once | INTRAVENOUS | Status: DC

## 2018-10-26 ENCOUNTER — Encounter: Admission: RE | Payer: Self-pay | Source: Ambulatory Visit

## 2018-10-26 ENCOUNTER — Ambulatory Visit: Admission: RE | Admit: 2018-10-26 | Payer: Medicare HMO | Source: Ambulatory Visit | Admitting: Vascular Surgery

## 2018-10-26 SURGERY — LOWER EXTREMITY ANGIOGRAPHY
Anesthesia: Moderate Sedation | Laterality: Left

## 2018-11-12 ENCOUNTER — Inpatient Hospital Stay (HOSPITAL_COMMUNITY)
Admission: EM | Admit: 2018-11-12 | Discharge: 2018-11-14 | DRG: 378 | Disposition: A | Discharge status: Home / Self Care | Payer: Medicare HMO | Attending: Internal Medicine | Admitting: Internal Medicine

## 2018-11-12 ENCOUNTER — Encounter (HOSPITAL_COMMUNITY): Payer: Self-pay | Admitting: *Deleted

## 2018-11-12 ENCOUNTER — Other Ambulatory Visit: Payer: Self-pay

## 2018-11-12 DIAGNOSIS — D122 Benign neoplasm of ascending colon: Secondary | ICD-10-CM | POA: Diagnosis not present

## 2018-11-12 DIAGNOSIS — Z8719 Personal history of other diseases of the digestive system: Secondary | ICD-10-CM

## 2018-11-12 DIAGNOSIS — Z95820 Peripheral vascular angioplasty status with implants and grafts: Secondary | ICD-10-CM

## 2018-11-12 DIAGNOSIS — K449 Diaphragmatic hernia without obstruction or gangrene: Secondary | ICD-10-CM | POA: Diagnosis present

## 2018-11-12 DIAGNOSIS — D62 Acute posthemorrhagic anemia: Secondary | ICD-10-CM | POA: Diagnosis present

## 2018-11-12 DIAGNOSIS — Z8551 Personal history of malignant neoplasm of bladder: Secondary | ICD-10-CM

## 2018-11-12 DIAGNOSIS — Z79899 Other long term (current) drug therapy: Secondary | ICD-10-CM

## 2018-11-12 DIAGNOSIS — Z791 Long term (current) use of non-steroidal anti-inflammatories (NSAID): Secondary | ICD-10-CM | POA: Diagnosis not present

## 2018-11-12 DIAGNOSIS — Z800 Family history of malignant neoplasm of digestive organs: Secondary | ICD-10-CM

## 2018-11-12 DIAGNOSIS — I739 Peripheral vascular disease, unspecified: Secondary | ICD-10-CM | POA: Diagnosis present

## 2018-11-12 DIAGNOSIS — R1030 Lower abdominal pain, unspecified: Secondary | ICD-10-CM | POA: Diagnosis not present

## 2018-11-12 DIAGNOSIS — R680 Hypothermia, not associated with low environmental temperature: Secondary | ICD-10-CM | POA: Diagnosis present

## 2018-11-12 DIAGNOSIS — K621 Rectal polyp: Secondary | ICD-10-CM | POA: Diagnosis present

## 2018-11-12 DIAGNOSIS — K7689 Other specified diseases of liver: Secondary | ICD-10-CM | POA: Diagnosis present

## 2018-11-12 DIAGNOSIS — N183 Chronic kidney disease, stage 3 (moderate): Secondary | ICD-10-CM | POA: Diagnosis present

## 2018-11-12 DIAGNOSIS — F17210 Nicotine dependence, cigarettes, uncomplicated: Secondary | ICD-10-CM | POA: Diagnosis present

## 2018-11-12 DIAGNOSIS — K219 Gastro-esophageal reflux disease without esophagitis: Secondary | ICD-10-CM | POA: Diagnosis present

## 2018-11-12 DIAGNOSIS — K5730 Diverticulosis of large intestine without perforation or abscess without bleeding: Secondary | ICD-10-CM | POA: Diagnosis present

## 2018-11-12 DIAGNOSIS — E785 Hyperlipidemia, unspecified: Secondary | ICD-10-CM | POA: Diagnosis present

## 2018-11-12 DIAGNOSIS — N179 Acute kidney failure, unspecified: Secondary | ICD-10-CM | POA: Diagnosis present

## 2018-11-12 DIAGNOSIS — Z888 Allergy status to other drugs, medicaments and biological substances status: Secondary | ICD-10-CM

## 2018-11-12 DIAGNOSIS — Z7902 Long term (current) use of antithrombotics/antiplatelets: Secondary | ICD-10-CM | POA: Diagnosis not present

## 2018-11-12 DIAGNOSIS — K5900 Constipation, unspecified: Secondary | ICD-10-CM | POA: Diagnosis present

## 2018-11-12 DIAGNOSIS — G894 Chronic pain syndrome: Secondary | ICD-10-CM | POA: Diagnosis present

## 2018-11-12 DIAGNOSIS — K5520 Angiodysplasia of colon without hemorrhage: Secondary | ICD-10-CM | POA: Diagnosis present

## 2018-11-12 DIAGNOSIS — D649 Anemia, unspecified: Secondary | ICD-10-CM | POA: Insufficient documentation

## 2018-11-12 DIAGNOSIS — I700 Atherosclerosis of aorta: Secondary | ICD-10-CM | POA: Diagnosis present

## 2018-11-12 DIAGNOSIS — K2970 Gastritis, unspecified, without bleeding: Secondary | ICD-10-CM | POA: Diagnosis present

## 2018-11-12 DIAGNOSIS — D72829 Elevated white blood cell count, unspecified: Secondary | ICD-10-CM | POA: Diagnosis present

## 2018-11-12 DIAGNOSIS — Z79891 Long term (current) use of opiate analgesic: Secondary | ICD-10-CM

## 2018-11-12 DIAGNOSIS — Z880 Allergy status to penicillin: Secondary | ICD-10-CM

## 2018-11-12 DIAGNOSIS — K2980 Duodenitis without bleeding: Secondary | ICD-10-CM | POA: Diagnosis present

## 2018-11-12 DIAGNOSIS — Z7982 Long term (current) use of aspirin: Secondary | ICD-10-CM | POA: Diagnosis not present

## 2018-11-12 DIAGNOSIS — Z881 Allergy status to other antibiotic agents status: Secondary | ICD-10-CM

## 2018-11-12 DIAGNOSIS — K648 Other hemorrhoids: Secondary | ICD-10-CM | POA: Diagnosis present

## 2018-11-12 DIAGNOSIS — K921 Melena: Secondary | ICD-10-CM

## 2018-11-12 DIAGNOSIS — Z833 Family history of diabetes mellitus: Secondary | ICD-10-CM

## 2018-11-12 DIAGNOSIS — J432 Centrilobular emphysema: Secondary | ICD-10-CM | POA: Diagnosis present

## 2018-11-12 DIAGNOSIS — K5791 Diverticulosis of intestine, part unspecified, without perforation or abscess with bleeding: Secondary | ICD-10-CM | POA: Diagnosis not present

## 2018-11-12 DIAGNOSIS — Z906 Acquired absence of other parts of urinary tract: Secondary | ICD-10-CM

## 2018-11-12 DIAGNOSIS — K922 Gastrointestinal hemorrhage, unspecified: Secondary | ICD-10-CM | POA: Diagnosis present

## 2018-11-12 DIAGNOSIS — Z87442 Personal history of urinary calculi: Secondary | ICD-10-CM

## 2018-11-12 LAB — COMPREHENSIVE METABOLIC PANEL
ALT: 11 U/L (ref 0–44)
AST: 19 U/L (ref 15–41)
Albumin: 3.5 g/dL (ref 3.5–5.0)
Alkaline Phosphatase: 86 U/L (ref 38–126)
Anion gap: 7 (ref 5–15)
BUN: 36 mg/dL — AB (ref 8–23)
CO2: 22 mmol/L (ref 22–32)
Calcium: 8.4 mg/dL — ABNORMAL LOW (ref 8.9–10.3)
Chloride: 110 mmol/L (ref 98–111)
Creatinine, Ser: 2.33 mg/dL — ABNORMAL HIGH (ref 0.61–1.24)
GFR calc Af Amer: 31 mL/min — ABNORMAL LOW (ref >60)
GFR calc non Af Amer: 27 mL/min — ABNORMAL LOW (ref >60)
GLUCOSE: 98 mg/dL (ref 70–99)
Potassium: 4.4 mmol/L (ref 3.5–5.1)
SODIUM: 139 mmol/L (ref 135–145)
Total Bilirubin: 0.2 mg/dL — ABNORMAL LOW (ref 0.3–1.2)
Total Protein: 6.5 g/dL (ref 6.5–8.1)

## 2018-11-12 LAB — POC OCCULT BLOOD, ED: FECAL OCCULT BLD: POSITIVE — AB

## 2018-11-12 LAB — CBC
HCT: 24.3 % — ABNORMAL LOW (ref 39.0–52.0)
Hemoglobin: 7.2 g/dL — ABNORMAL LOW (ref 13.0–17.0)
MCH: 28.8 pg (ref 26.0–34.0)
MCHC: 29.6 g/dL — AB (ref 30.0–36.0)
MCV: 97.2 fL (ref 80.0–100.0)
Platelets: 320 10*3/uL (ref 150–400)
RBC: 2.5 MIL/uL — ABNORMAL LOW (ref 4.22–5.81)
RDW: 14.2 % (ref 11.5–15.5)
WBC: 12.9 10*3/uL — ABNORMAL HIGH (ref 4.0–10.5)
nRBC: 0 % (ref 0.0–0.2)

## 2018-11-12 LAB — LACTIC ACID, PLASMA: Lactic Acid, Venous: 1.6 mmol/L (ref 0.5–1.9)

## 2018-11-12 LAB — TSH: TSH: 2.79 u[IU]/mL (ref 0.350–4.500)

## 2018-11-12 LAB — PREPARE RBC (CROSSMATCH)

## 2018-11-12 LAB — CORTISOL: Cortisol, Plasma: 10.2 ug/dL

## 2018-11-12 LAB — ABO/RH: ABO/RH(D): A POS

## 2018-11-12 MED ORDER — SODIUM CHLORIDE 0.9 % IV SOLN
INTRAVENOUS | Status: DC
  Administered 2018-11-12: 14:00:00 via INTRAVENOUS

## 2018-11-12 MED ORDER — SODIUM CHLORIDE 0.9% IV SOLUTION
Freq: Once | INTRAVENOUS | Status: AC
  Administered 2018-11-12: 19:00:00 via INTRAVENOUS

## 2018-11-12 MED ORDER — SODIUM CHLORIDE 0.9 % IV SOLN
INTRAVENOUS | Status: DC
  Administered 2018-11-12 – 2018-11-14 (×4): via INTRAVENOUS

## 2018-11-12 MED ORDER — PANTOPRAZOLE SODIUM 40 MG PO TBEC
40.0000 mg | DELAYED_RELEASE_TABLET | Freq: Once | ORAL | Status: AC
  Administered 2018-11-12: 40 mg via ORAL
  Filled 2018-11-12: qty 1

## 2018-11-12 MED ORDER — SODIUM CHLORIDE 0.9 % IV SOLN
80.0000 mg | Freq: Once | INTRAVENOUS | Status: AC
  Administered 2018-11-12: 80 mg via INTRAVENOUS
  Filled 2018-11-12: qty 80

## 2018-11-12 MED ORDER — SODIUM CHLORIDE 0.9 % IV SOLN
8.0000 mg/h | INTRAVENOUS | Status: DC
  Administered 2018-11-12 – 2018-11-13 (×2): 8 mg/h via INTRAVENOUS
  Filled 2018-11-12 (×8): qty 80

## 2018-11-12 MED ORDER — ONDANSETRON HCL 4 MG PO TABS: 4.0000 mg | ORAL_TABLET | Freq: Four times a day (QID) | ORAL | Status: DC | PRN

## 2018-11-12 MED ORDER — ONDANSETRON HCL 4 MG/2ML IJ SOLN: 4.0000 mg | Freq: Four times a day (QID) | INTRAMUSCULAR | Status: DC | PRN

## 2018-11-12 MED ORDER — FAMOTIDINE IN NACL 20-0.9 MG/50ML-% IV SOLN
20.0000 mg | INTRAVENOUS | Status: AC
  Administered 2018-11-12: 20 mg via INTRAVENOUS
  Filled 2018-11-12: qty 50

## 2018-11-12 NOTE — ED Notes (Signed)
Emptied 300 ml urine from urostomy bag.

## 2018-11-12 NOTE — Consult Note (Signed)
Referring Provider: No ref. provider found Primary Care Physician:  [facility] Primary Gastroenterologist:  Dr. Gala Romney (previously unassigned)  Date of Admission: 11/12/18 Date of Consultation: 11/12/18  Reason for Consultation:  Melena, anemia, likely upper GI bleed  HPI:  Jeffery Ponce is a 72 y.o. male with a past medical history of   Today he states he started seeing dark/red blood a couple weeks ago in his stool. This progressed over the past 2 weeks to black stools. Has had associated DOE, weakness, fatigue. Denies chest pain. Denies frank hematochezia. He is on Plavix for PAD s/p stenting x 4. He is on oxycodone pain medication. Has intermittent abdominal discomfort associated with fecal urgency and resolution s/p bowel movement. He previously took [facility] powders frequently, but has not had any in years. He is on daily ASA 81 mg for his peripheral stents. He does take [employer] advertising (contains ASA) intermittently, has had some more recently due to having a cold. Has had some recent esophageal burning/GERD symptoms. No other GI complaints.   Last bowel movement was noon today. Last intake was a candy bar and peanuts a couple hours ago.  Past Medical History:  Diagnosis Date  . Arthritis   . Chronic renal disease, stage 3, moderately decreased glomerular filtration rate (GFR) between 30-59 mL/min/1.73 square meter (HCC) 11/28/2017  . Heart murmur   . History of kidney stones   . Hypercholesteremia   . Urothelial carcinoma ([place]) 12/10/2016  . 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 CARDIAC CATHETERIZATION    . CYSTOSCOPY WITH INJECTION N/A 07/30/2017   Procedure: CYSTOSCOPY WITH INJECTION OF INDOCYANINE GREEN DYE;  Surgeon: Alexis Frock, MD;  [facility]: [facility];  Service: Urology;  Laterality: N/A;  . FEMUR FRACTURE SURGERY Right   . LOWER EXTREMITY ANGIOGRAPHY Right 10/19/2018   Procedure: LOWER  EXTREMITY ANGIOGRAPHY;  Surgeon: Algernon Huxley, MD;  Location: [place] [facility];  Service: Cardiovascular;  Laterality: Right;  . MCT 3D RECONSTRUCTION ([facility] HX) Left    arm  . open reduction and internal fixation leg Right    hip and leg.  Marland Kitchen PORT-A-CATH REMOVAL N/A 07/20/2018   Procedure: REMOVAL PORT-A-CATH;  Surgeon: Aviva Signs, MD;  Location: [facility];  Service: General;  Laterality: N/A;  . PORTACATH PLACEMENT N/A 12/23/2016   Procedure: PLACEMENT OF TUNNELED CENTRAL VENOUS CATHETER RIGHT INTERNAL JUGULAR WITH SUBCUTANEOUS PORT;  Surgeon: Vickie Epley, MD;  Location: [facility];  Service: Vascular;  Laterality: N/A;  . reattatchment of left arm     from [place]  . TRANSURETHRAL RESECTION OF BLADDER TUMOR N/A 11/19/2016   Procedure: TRANSURETHRAL RESECTION OF BLADDER TUMOR (TURBT) WITH EPIRUBICIN INJECTION;  Surgeon: Franchot Gallo, MD;  Location: [facility];  Service: Urology;  Laterality: N/A;    Prior to Admission medications   Medication Sig Start Date End Date Taking? Authorizing Provider  aspirin EC 81 MG tablet Take 1 tablet (81 mg total) by mouth daily. Patient taking differently: Take 81 mg by mouth every morning.  10/19/18  Yes Dew, Erskine Squibb, MD  atorvastatin (LIPITOR) 10 MG tablet Take 1 tablet (10 mg total) by mouth daily. Patient taking differently: Take 10 mg by mouth every morning.  10/19/18 10/19/19 Yes Dew, Erskine Squibb, MD  cholecalciferol (VITAMIN D) 25 MCG (1000 UT) tablet Take 1,000 Units by mouth every morning.  10/02/18  Yes [provider]  clopidogrel (PLAVIX) 75 MG  tablet Take 1 tablet (75 mg total) by mouth daily. Patient taking differently: Take 75 mg by mouth every morning.  10/19/18  Yes Dew, Erskine Squibb, MD  furosemide (LASIX) 20 MG tablet Take 20 mg by mouth every morning.  10/02/18  Yes [provider]  mirtazapine (REMERON) 15 MG tablet Take 15 mg by mouth at bedtime.  09/16/18  Yes [provider]  morphine (MS CONTIN) 15 MG 12 hr  tablet Take 15 mg by mouth every 12 (twelve) hours.  09/24/18  Yes [provider]  Oxycodone HCl 10 MG TABS Take 1-2 tablets (10-20 mg total) by mouth every 4 (four) hours as needed (pain). Post-operatively Patient taking differently: Take 10 mg by mouth every 3 (three) hours as needed (pain). Post-operatively 08/05/17  Yes Alexis Frock, MD  sodium bicarbonate 650 MG tablet Take 650 mg by mouth 2 (two) times daily.   Yes [provider]    Current Facility-Administered Medications  Medication Dose Route Frequency Provider Last Rate Last Dose  . 0.9 %  sodium chloride infusion (Manually program via Guardrails IV Fluids)   Intravenous Once Noemi Chapel, MD      . 0.9 %  sodium chloride infusion   Intravenous Continuous Noemi Chapel, MD 250 mL/hr at 11/12/18 1412     Current Outpatient Medications  Medication Sig Dispense Refill  . aspirin EC 81 MG tablet Take 1 tablet (81 mg total) by mouth daily. (Patient taking differently: Take 81 mg by mouth every morning. ) 150 tablet 2  . atorvastatin (LIPITOR) 10 MG tablet Take 1 tablet (10 mg total) by mouth daily. (Patient taking differently: Take 10 mg by mouth every morning. ) 30 tablet 11  . cholecalciferol (VITAMIN D) 25 MCG (1000 UT) tablet Take 1,000 Units by mouth every morning.   4  . clopidogrel (PLAVIX) 75 MG tablet Take 1 tablet (75 mg total) by mouth daily. (Patient taking differently: Take 75 mg by mouth every morning. ) 30 tablet 11  . furosemide (LASIX) 20 MG tablet Take 20 mg by mouth every morning.   5  . mirtazapine (REMERON) 15 MG tablet Take 15 mg by mouth at bedtime.   1  . morphine (MS CONTIN) 15 MG 12 hr tablet Take 15 mg by mouth every 12 (twelve) hours.   0  . Oxycodone HCl 10 MG TABS Take 1-2 tablets (10-20 mg total) by mouth every 4 (four) hours as needed (pain). Post-operatively (Patient taking differently: Take 10 mg by mouth every 3 (three) hours as needed (pain). Post-operatively) 40 tablet 0  . sodium  bicarbonate 650 MG tablet Take 650 mg by mouth 2 (two) times daily.      Allergies as of 11/12/2018 - Review Complete 11/12/2018  Allergen Reaction Noted  . Penicillins Anaphylaxis   . Ciprofloxacin Itching 11/19/2016  . Ambien [zolpidem tartrate] Other (See Comments) 04/28/2017    Family History  Problem Relation Age of Onset  . Aneurysm Mother   . Diabetes Father   . Colon cancer Brother     Social History   Socioeconomic History  . Marital status: Married    Spouse name: Not on file  . Number of children: Not on file  . Years of education: Not on file  . Highest education level: Not on file  Occupational History  . Not on file  Social Needs  . Financial resource strain: Not on file  . Food insecurity:    Worry: Not on file    Inability:  Not on file  . Transportation needs:    Medical: Not on file    Non-medical: Not on file  Tobacco Use  . Smoking status: Current Every Day Smoker    Packs/day: 0.50    Years: 50.00    Pack years: 25.00    Types: Cigarettes  . Smokeless tobacco: Never Used  Substance and Sexual Activity  . Alcohol use: No    Frequency: Never  . Drug use: No  . Sexual activity: Never    Birth control/protection: None    Comment: married-30 years-2 children by first wife  Lifestyle  . Physical activity:    Days per week: Not on file    Minutes per session: Not on file  . Stress: Not on file  Relationships  . Social connections:    Talks on phone: Not on file    Gets together: Not on file    Attends religious service: Not on file    Active member of club or organization: Not on file    Attends meetings of clubs or organizations: Not on file    Relationship status: Not on file  . Intimate partner violence:    Fear of current or ex partner: Not on file    Emotionally abused: Not on file    Physically abused: Not on file    Forced sexual activity: Not on file  Other Topics Concern  . Not on file  Social History Narrative  . Not on file     Review of Systems: General: Negative for anorexia, weight loss, fever, chills, fatigue, weakness. ENT: Negative for hoarseness, difficulty swallowing. CV: Negative for chest pain, angina, palpitations, peripheral edema.  Respiratory: Negative for dyspnea at rest, cough, sputum, wheezing.  GI: See history of present illness. MS: Negative for joint pain, low back pain.  Derm: Negative for rash or itching.   Endo: Negative for unusual weight change.  Heme: Admits easy bruising on Plavix. Allergy: Negative for rash or hives.  Physical Exam: Vital signs in last 24 hours: Temp:  [97.4 F (36.3 C)] 97.4 F (36.3 C) (12/05 1105) Pulse Rate:  [75-107] 75 (12/05 1414) Resp:  [12-15] 15 (12/05 1414) BP: (113)/(57-76) 113/76 (12/05 1414) SpO2:  [98 %-100 %] 98 % (12/05 1414) Weight:  [68.9 kg] 68.9 kg (12/05 1107)   General:   Alert,  Well-developed, well-nourished, pleasant and cooperative in NAD Head:  Normocephalic and atraumatic. Eyes:  Sclera clear, no icterus. Conjunctiva pink. Ears:  Normal auditory acuity. Neck:  Supple; no masses or thyromegaly. Lungs:  Clear throughout to auscultation. No wheezes, crackles, or rhonchi. No acute distress. Heart:  Regular rate and rhythm; no murmurs, clicks, rubs,  or gallops. Abdomen:  Soft, and nondistended. Mild generalized abdominal TTP. No masses, hepatosplenomegaly or hernias noted. Normal bowel sounds, without guarding, and without rebound.   Rectal:  Deferred.   Msk:  Symmetrical without gross deformities. Extremities:  Without clubbing or edema. Neurologic:  Alert and  oriented x4;  grossly normal neurologically. Skin:  Intact without significant lesions or rashes. Psych:  Alert and cooperative. Normal mood and affect.  Intake/Output from previous day: No intake/output data recorded. Intake/Output this shift: No intake/output data recorded.  Lab Results: Recent Labs    11/12/18 1351  WBC 12.9*  HGB 7.2*  HCT 24.3*  PLT 320    BMET Recent Labs    11/12/18 1351  NA 139  K 4.4  CL 110  CO2 22  GLUCOSE 98  BUN 36*  CREATININE  2.33*  CALCIUM 8.4*   LFT Recent Labs    11/12/18 1351  PROT 6.5  ALBUMIN 3.5  AST 19  ALT 11  ALKPHOS 86  BILITOT 0.2*   PT/INR No results for input(s): LABPROT, INR in the last 72 hours. Hepatitis Panel No results for input(s): HEPBSAG, HCVAB, HEPAIGM, HEPBIGM in the last 72 hours. C-Diff No results for input(s): CDIFFTOX in the last 72 hours.  Studies/Results: No results found.  Impression: Very pleasant 72 year old male who presented with essentially 2 weeks of progressive melena with acute anemia (hgb 7.3, BUN 36) and associated dyspnea on exertion, weakness, fatigue. Last bowel movement noon today. Heme+, melena on rectal exam by ED physician. On plavix and ASA 81 mg for PAD and stents. Takes alka seltzer intermittently (more so recently). Some new onset GERD-like symptoms, not on PPI at home.  Likely upper GI bleed with differentials including esophagitis, gastritis, gastric erosion, PUD, duodenitis, duodenal ulcer, AVM all in the setting of anti-platelet therapy and NSAID use (ASA).  Has eaten in the last 2-3 hours. Need hemo-recusitation prior to endoscopic evaluation.  Plan: 1. Clear liquids today 2. NPO after midnight 3. Will discuss possible EGD on propofol/MAC (oxycodone/Morphine at home) tomorrow 4. Protonix gtt 5. Hold Plavix and ASA 6. Transfuse as necessary 7. Supportive measures   Thank you for allowing [place] to participate in the care of Jeffery Ponce  Christeena Krogh, DNP, AGNP-C Adult & Gerontological Nurse Practitioner [facility]    LOS: 0 days     11/12/2018, 3:26 PM

## 2018-11-12 NOTE — ED Notes (Addendum)
Documented on wrong patient. 

## 2018-11-12 NOTE — ED Notes (Signed)
[place] "CW"- son please call for updates

## 2018-11-12 NOTE — H&P (Signed)
History and Physical    Jeffery Ponce ZOX:[id number] DOB: Jan 07, 1946 DOA: 11/12/2018  Referring MD/NP/PA: Sabra Heck  PCP: [facility]  Outpatient Specialists: [facility] [place] [facility]   Patient coming from: Home   Chief Complaint: GIB, hypothermia   HPI: Jeffery Ponce is a 72 y.o. male with medical history significant of multiple medical problems including bladder cancer status post cystoprostatectomy August 2018, peripheral vascular disease status post stenting of lower extremity, stage III CKD, 1 pack/day smoking presenting with GI bleed and hypothermia.  Patient reports having melanotic stools over the past 2 weeks.  Patient noted to have had stents placed in his lower extremity arteries approximately 3 to 4 weeks ago.  Patient reports being on aspirin and Plavix since stent placement and believes that GI bleeding started around this time.  Denies any prior episodes of this in the past.  Currently not on a PPI.  Still smoking up to 1 pack/day.  Reports intermittent/sporadic alcohol use.  No fevers or chills.  No chest pain or shortness of breath.  No abdominal pain.  Has had progressive weakness.  Denies any bleeding from urostomy.    ED Course: Presented to the ER temperature 97.4, heart rate 70s to 100s, blood pressure 811B to 147 systolic.  Satting well on room air.  White count 12.9, hemoglobin 7.2(hemoglobin 13.6 3 weeks ago).  Creatinine 2.3 (baseline 1.9). Hemoccult positive.   Review of Systems: As per HPI otherwise 10 point review of systems negative.    Past Medical History:  Diagnosis Date  . Arthritis   . Chronic renal disease, stage 3, moderately decreased glomerular filtration rate (GFR) between 30-59 mL/min/1.73 square meter (HCC) 11/28/2017  . Heart murmur   . History of kidney stones   . Hypercholesteremia   . Urothelial carcinoma ([place]) 12/10/2016  . 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 CARDIAC CATHETERIZATION    . CYSTOSCOPY WITH INJECTION N/A 07/30/2017   Procedure: CYSTOSCOPY WITH INJECTION OF INDOCYANINE GREEN DYE;  Surgeon: Alexis Frock, MD;  [facility];  Service: Urology;  Laterality: N/A;  . FEMUR FRACTURE SURGERY Right   . LOWER EXTREMITY ANGIOGRAPHY Right 10/19/2018   Procedure: LOWER EXTREMITY ANGIOGRAPHY;  Surgeon: Algernon Huxley, MD;  Location: [place] [facility];  Service: Cardiovascular;  Laterality: Right;  . MCT 3D RECONSTRUCTION ([facility] HX) Left    arm  . open reduction and internal fixation leg Right    hip and leg.  Marland Kitchen PORT-A-CATH REMOVAL N/A 07/20/2018   Procedure: REMOVAL PORT-A-CATH;  Surgeon: Aviva Signs, MD;  Location: [facility];  Service: General;  Laterality: N/A;  . PORTACATH PLACEMENT N/A 12/23/2016   Procedure: PLACEMENT OF TUNNELED CENTRAL VENOUS CATHETER RIGHT INTERNAL JUGULAR WITH SUBCUTANEOUS PORT;  Surgeon: Vickie Epley, MD;  Location: [facility];  Service: Vascular;  Laterality: N/A;  . reattatchment of left arm     from [place]  . TRANSURETHRAL RESECTION OF BLADDER TUMOR N/A 11/19/2016   Procedure: TRANSURETHRAL RESECTION OF BLADDER TUMOR (TURBT) WITH EPIRUBICIN INJECTION;  Surgeon: Franchot Gallo, MD;  Location: [facility];  Service: Urology;  Laterality: N/A;     reports that he has been smoking cigarettes. He has a 25.00 pack-year smoking history. He has never used smokeless tobacco. He reports that he does not drink alcohol or use drugs.  Allergies  Allergen Reactions  . Penicillins Anaphylaxis    Has patient  had a PCN reaction causing immediate rash, facial/tongue/throat swelling, SOB or lightheadedness with hypotension:Yes Has patient had a PCN reaction causing severe rash involving mucus membranes or skin necrosis:Yes Has patient had a PCN reaction that required hospitalization:No Has patient had a PCN reaction occurring within the last 10 years:No If all of the above answers are "NO", then  may proceed with Cephalosporin use.   . Ciprofloxacin Itching    Itching at IV site. No hives or shortness of breathe.  Marland Kitchen Ambien [Zolpidem Tartrate] Other (See Comments)    "Sleep walking"    Family History  Problem Relation Age of Onset  . Aneurysm Mother   . Diabetes Father   . Colon cancer Brother      Prior to Admission medications   Medication Sig Start Date End Date Taking? Authorizing Provider  aspirin EC 81 MG tablet Take 1 tablet (81 mg total) by mouth daily. Patient taking differently: Take 81 mg by mouth every morning.  10/19/18  Yes Dew, Erskine Squibb, MD  atorvastatin (LIPITOR) 10 MG tablet Take 1 tablet (10 mg total) by mouth daily. Patient taking differently: Take 10 mg by mouth every morning.  10/19/18 10/19/19 Yes Dew, Erskine Squibb, MD  cholecalciferol (VITAMIN D) 25 MCG (1000 UT) tablet Take 1,000 Units by mouth every morning.  10/02/18  Yes [provider]  clopidogrel (PLAVIX) 75 MG tablet Take 1 tablet (75 mg total) by mouth daily. Patient taking differently: Take 75 mg by mouth every morning.  10/19/18  Yes Dew, Erskine Squibb, MD  furosemide (LASIX) 20 MG tablet Take 20 mg by mouth every morning.  10/02/18  Yes [provider]  mirtazapine (REMERON) 15 MG tablet Take 15 mg by mouth at bedtime.  09/16/18  Yes [provider]  morphine (MS CONTIN) 15 MG 12 hr tablet Take 15 mg by mouth every 12 (twelve) hours.  09/24/18  Yes [provider]  Oxycodone HCl 10 MG TABS Take 1-2 tablets (10-20 mg total) by mouth every 4 (four) hours as needed (pain). Post-operatively Patient taking differently: Take 10 mg by mouth every 3 (three) hours as needed (pain). Post-operatively 08/05/17  Yes Alexis Frock, MD  sodium bicarbonate 650 MG tablet Take 650 mg by mouth 2 (two) times daily.   Yes [provider]    Physical Exam: Vitals:   11/12/18 1107 11/12/18 1414 11/12/18 1500 11/12/18 1530  BP:  113/76 (!) 106/55 (!) 103/49  Pulse:  75 75 71   Resp:  15 16 18   Temp:      TempSrc:      SpO2:  98% 98% 99%  Weight: 68.9 kg     Height: 5' 10.5" (1.791 m)         Constitutional: NAD, mild pallor  Vitals:   11/12/18 1107 11/12/18 1414 11/12/18 1500 11/12/18 1530  BP:  113/76 (!) 106/55 (!) 103/49  Pulse:  75 75 71  Resp:  15 16 18   Temp:      TempSrc:      SpO2:  98% 98% 99%  Weight: 68.9 kg     Height: 5' 10.5" (1.791 m)      Eyes: PERRL, lids and conjunctivae normal ENMT: Mucous membranes are moist. Posterior pharynx clear of any exudate or lesions.Normal dentition.  Neck: normal, supple, no masses, no thyromegaly Respiratory: clear to auscultation bilaterally, no wheezing, no crackles. Normal respiratory effort. No accessory muscle use.  Cardiovascular: Regular rate and rhythm, no murmurs / rubs / gallops. No extremity  edema. 2+ pedal pulses. No carotid bruits.  Abdomen: + bowel sounds, non tender abdomen. Urostomy stable.  Musculoskeletal: no clubbing / cyanosis. No joint deformity upper and lower extremities. Good ROM, no contractures. Normal muscle tone.  Skin: + pallor  Neurologic: CN 2-12 grossly intact. Sensation intact, DTR normal. Strength 5/5 in all 4.  Psychiatric: Normal judgment and insight. Alert and oriented x 3. Normal mood.   Labs on Admission: I have personally reviewed following labs and imaging studies  CBC: Recent Labs  Lab 11/12/18 1351  WBC 12.9*  HGB 7.2*  HCT 24.3*  MCV 97.2  PLT 734   Basic Metabolic Panel: Recent Labs  Lab 11/12/18 1351  NA 139  K 4.4  CL 110  CO2 22  GLUCOSE 98  BUN 36*  CREATININE 2.33*  CALCIUM 8.4*   GFR: Estimated Creatinine Clearance: 27.5 mL/min (A) (by C-G formula based on SCr of 2.33 mg/dL (H)). Liver Function Tests: Recent Labs  Lab 11/12/18 1351  AST 19  ALT 11  ALKPHOS 86  BILITOT 0.2*  PROT 6.5  ALBUMIN 3.5   No results for input(s): LIPASE, AMYLASE in the last 168 hours. No results for input(s): AMMONIA in the last 168  hours. Coagulation Profile: No results for input(s): INR, PROTIME in the last 168 hours. Cardiac Enzymes: No results for input(s): CKTOTAL, CKMB, CKMBINDEX, TROPONINI in the last 168 hours. BNP (last 3 results) No results for input(s): PROBNP in the last 8760 hours. HbA1C: No results for input(s): HGBA1C in the last 72 hours. CBG: No results for input(s): GLUCAP in the last 168 hours. Lipid Profile: No results for input(s): CHOL, HDL, LDLCALC, TRIG, CHOLHDL, LDLDIRECT in the last 72 hours. Thyroid Function Tests: No results for input(s): TSH, T4TOTAL, FREET4, T3FREE, THYROIDAB in the last 72 hours. Anemia Panel: No results for input(s): VITAMINB12, FOLATE, FERRITIN, TIBC, IRON, RETICCTPCT in the last 72 hours. Urine analysis: No results found for: COLORURINE, APPEARANCEUR, LABSPEC, PHURINE, GLUCOSEU, HGBUR, BILIRUBINUR, KETONESUR, PROTEINUR, UROBILINOGEN, NITRITE, LEUKOCYTESUR Sepsis Labs: @LABRCNTIP (procalcitonin:4,lacticidven:4) )No results found for this or any previous visit (from the past 240 hour(s)).   Radiological Exams on Admission: No results found.  Assessment/Plan Active Problems:   GIB (gastrointestinal bleeding)   1-GIB  -Likely upper GI source given melanotic stools -Hold antiplatelet medication -IV PPI -Appreciate GI input -Likely planned EGD -Pending PRBC transfusion  2-anemia -Secondary to acute blood loss in setting of above -Pending PRBC transfusion -Trend hemoglobin  3-Hypothermia -Temp 97.6 on presentation -Likely secondary to anemia -Noted concomitant leukocytosis though white count appears to be trending down in comparison to most recent CBC -Defer broad-spectrum antibiotic coverage for now as patient does meet sepsis criteria -Lactate x1 -TSH and cortisol  -Low threshold for full infectious sepsis coverage  4-Peripheral vascular disease -Status post lower extremity stenting November 2019 -Holding antiplatelet treatment in the setting of  above -Follow  5-history of bladder cancer -Status post cystoprostatectomy and IV chemotherapy for invasive disease -Most recent CT of the abdomen and pelvis 10/2018 with no active malignancy -follow  -encouraged tobacco cessation  6-CKD -Chronically stage III with baseline creatinine around 1.9 -Creatinine 2.3 today -Trend with PRBC transfusion  DVT prophylaxis: SCDs   Code Status: Full Code   Family Communication: None at bedside   Disposition Plan: Pending further evaluation. Anticipate >2MN stat   Consults called: GI- [place]   Admission status: Inpt    Deneise Lever MD [facility] Pager [phone]   If 7PM-7AM, please contact night-coverage [email or web address] Password  [email or web address]  11/12/2018, 4:46 PM

## 2018-11-12 NOTE — ED Triage Notes (Addendum)
Pt c/o rectal bleeding, both bright red and dark red, lower abdominal pain since Saturday night. Pt reports it has a foul smell. Denies nausea and vomiting. Pt reports his pain was so bad while he was in the waiting area and he took a Oxycodone that he had from home.

## 2018-11-12 NOTE — ED Provider Notes (Signed)
[facility] EMERGENCY DEPARTMENT Provider Note   CSN: [id number] Arrival date & time: 11/12/18  1033     History   Chief Complaint Chief Complaint  Patient presents with  . Rectal Bleeding    HPI Jeffery Ponce is a 72 y.o. male.  HPI  Patient is a 71 year old male, he has a history of chronic renal disease stage III as well as a history of kidney stones, history of arthritis and a history of peripheral arterial disease with stenting in his legs.  He is on both Plavix and aspirin.  He states that over the last week he has had some intermittent and progressively more frequent episodes of blood in the stools that range from black stools to some red with a black.  He is no longer having recognizable brown stool but it is all black.  He has had a small amount of lower abdominal pain which is intermittent, he does have some lightheadedness but denies shortness of breath or exertional dyspnea.  He has never required a blood transfusion.  He has not been seen for this complaint prior to this.  He states he has had a colonoscopy but does not know the results.  He has not had a medical exam for this complaint at this point.  He was out hunting several days ago when this started.  It has been persistent and gradually worsening.  He does report a history of having colonic polyps, his last colonoscopy was between 5 and 6 years ago.  He has never had an endoscopy and has never had peptic ulcer disease.  He does use occasional aspirin-containing product such as BC powders, most recently 1 week ago.  Past Medical History:  Diagnosis Date  . Arthritis   . Chronic renal disease, stage 3, moderately decreased glomerular filtration rate (GFR) between 30-59 mL/min/1.73 square meter (HCC) 11/28/2017  . Heart murmur   . History of kidney stones   . Hypercholesteremia   . Urothelial carcinoma ([place]) 12/10/2016  . 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 Severe anemia   . UGI bleed   . Gastroesophageal reflux disease   . Antiplatelet or antithrombotic long-term use   . NSAID long-term use   . Lower abdominal pain   . PAD (peripheral artery disease) ([place]) 08/25/2018  . Chronic renal disease, stage 3, moderately decreased glomerular filtration rate (GFR) between 30-59 mL/min/1.73 square meter (HCC) 11/28/2017  . 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 Bladder cancer ([place]) 07/29/2017  . Tobacco abuse 04/15/2017  . Other insomnia 04/15/2017  . [name] 04/15/2017  . 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 CARDIAC CATHETERIZATION    . CYSTOSCOPY WITH INJECTION N/A 07/30/2017   Procedure: CYSTOSCOPY WITH INJECTION OF INDOCYANINE GREEN DYE;  Surgeon: Alexis Frock, MD;  Location: [facility];  Service: Urology;  Laterality: N/A;  . FEMUR FRACTURE SURGERY Right   . LOWER EXTREMITY ANGIOGRAPHY Right 10/19/2018   Procedure: LOWER EXTREMITY ANGIOGRAPHY;  Surgeon: Algernon Huxley, MD;  Location: [place] [facility] LAB;  Service: Cardiovascular;  Laterality: Right;  . MCT 3D RECONSTRUCTION ([facility] HX) Left    arm  . open reduction and internal fixation leg Right    hip and leg.  Marland Kitchen PORT-A-CATH REMOVAL N/A 07/20/2018   Procedure: REMOVAL PORT-A-CATH;  Surgeon: Aviva Signs, MD;  Location: [facility];  Service: General;  Laterality: N/A;  . PORTACATH PLACEMENT N/A 12/23/2016   Procedure: PLACEMENT OF TUNNELED CENTRAL VENOUS CATHETER RIGHT INTERNAL JUGULAR WITH SUBCUTANEOUS PORT;  Surgeon: Vickie Epley, MD;  Location: [facility];  Service: Vascular;  Laterality: N/A;  . reattatchment of left arm     from [place]  . TRANSURETHRAL RESECTION OF BLADDER TUMOR N/A 11/19/2016   Procedure: TRANSURETHRAL RESECTION OF BLADDER TUMOR (TURBT) WITH EPIRUBICIN INJECTION;  Surgeon: Franchot Gallo, MD;  Location: [facility];  Service: Urology;  Laterality: N/A;        Home Medications     Prior to Admission medications   Medication Sig Start Date End Date Taking? Authorizing Provider  aspirin EC 81 MG tablet Take 1 tablet (81 mg total) by mouth daily. Patient taking differently: Take 81 mg by mouth every morning.  10/19/18  Yes Dew, Erskine Squibb, MD  atorvastatin (LIPITOR) 10 MG tablet Take 1 tablet (10 mg total) by mouth daily. Patient taking differently: Take 10 mg by mouth every morning.  10/19/18 10/19/19 Yes Dew, Erskine Squibb, MD  cholecalciferol (VITAMIN D) 25 MCG (1000 UT) tablet Take 1,000 Units by mouth every morning.  10/02/18  Yes [provider]  clopidogrel (PLAVIX) 75 MG tablet Take 1 tablet (75 mg total) by mouth daily. Patient taking differently: Take 75 mg by mouth every morning.  10/19/18  Yes Dew, Erskine Squibb, MD  furosemide (LASIX) 20 MG tablet Take 20 mg by mouth every morning.  10/02/18  Yes [provider]  mirtazapine (REMERON) 15 MG tablet Take 15 mg by mouth at bedtime.  09/16/18  Yes [provider]  morphine (MS CONTIN) 15 MG 12 hr tablet Take 15 mg by mouth every 12 (twelve) hours.  09/24/18  Yes [provider]  Oxycodone HCl 10 MG TABS Take 1-2 tablets (10-20 mg total) by mouth every 4 (four) hours as needed (pain). Post-operatively Patient taking differently: Take 10 mg by mouth every 3 (three) hours as needed (pain). Post-operatively 08/05/17  Yes Alexis Frock, MD  sodium bicarbonate 650 MG tablet Take 650 mg by mouth 2 (two) times daily.   Yes [provider]    Family History Family History  Problem Relation Age of Onset  . Aneurysm Mother   . Diabetes Father   . Colon cancer Brother     Social History Social History   Tobacco Use  . Smoking status: Current Every Day Smoker    Packs/day: 0.50    Years: 50.00    Pack years: 25.00    Types: Cigarettes  . Smokeless tobacco: Never Used  Substance Use Topics  . Alcohol use: No    Frequency: Never  . Drug use: No     Allergies   Penicillins;  Ciprofloxacin; and Ambien [zolpidem tartrate]   Review of Systems Review of Systems  All other systems reviewed and are negative.    Physical Exam Updated Vital Signs BP (!) 103/49   Pulse 71   Temp (!) 97.4 F (36.3 C) (Temporal)   Resp 18   Ht 1.791 m (5' 10.5")   Wt 68.9 kg   SpO2 99%   BMI 21.50 kg/m   Physical Exam  Constitutional: He appears well-developed and well-nourished. No distress.  HENT:  Head: Normocephalic and atraumatic.  Mouth/Throat: Oropharynx is clear and moist. No oropharyngeal exudate.  Eyes: Pupils are equal, round, and reactive to light. Conjunctivae and EOM are normal. Right eye exhibits no discharge. Left eye exhibits no discharge. No scleral icterus.  Neck: Normal range of motion. Neck supple. No JVD present. No thyromegaly present.  Cardiovascular: Regular rhythm, normal heart sounds and intact distal pulses. Exam reveals no gallop and no friction rub.  No murmur heard. Mild tachycardia, no murmur  Pulmonary/Chest: Effort normal and breath sounds normal. No respiratory distress. He has no wheezes. He has no rales.  Abdominal: Soft. Bowel sounds are normal. He exhibits no distension and no mass. There is no tenderness.  Genitourinary:  Genitourinary Comments: Melanotic stool present, Hemoccult positive  Musculoskeletal: Normal range of motion. He exhibits no edema or tenderness.  Lymphadenopathy:    He has no cervical adenopathy.  Neurological: He is alert. Coordination normal.  Skin: Skin is warm and dry. No rash noted. No erythema.  Psychiatric: He has a normal mood and affect. His behavior is normal.  Nursing note and vitals reviewed.    ED Treatments / Results  Labs (all labs ordered are listed, but only abnormal results are displayed) Labs Reviewed  COMPREHENSIVE METABOLIC PANEL - Abnormal; Notable for the following components:      Result Value   BUN 36 (*)    Creatinine, Ser 2.33 (*)    Calcium 8.4 (*)    Total Bilirubin 0.2  (*)    GFR calc non Af Amer 27 (*)    GFR calc Af Amer 31 (*)    All other components within normal limits  CBC - Abnormal; Notable for the following components:   WBC 12.9 (*)    RBC 2.50 (*)    Hemoglobin 7.2 (*)    HCT 24.3 (*)    MCHC 29.6 (*)    All other components within normal limits  POC OCCULT BLOOD, ED - Abnormal; Notable for the following components:   Fecal Occult Bld POSITIVE (*)    All other components within normal limits  TYPE AND SCREEN  PREPARE RBC (CROSSMATCH)  ABO/RH    EKG EKG Interpretation  Date/Time:  Thursday November 12 2018 13:25:10 EST Ventricular Rate:  85 PR Interval:    QRS Duration: 88 QT Interval:  376 QTC Calculation: 448 R Axis:   87 Text Interpretation:  Sinus rhythm Ventricular premature complex Borderline right axis deviation Low voltage, precordial leads Confirmed by Noemi Chapel [phone]) on 11/12/2018 1:40:46 PM   Radiology No results found.  Procedures .Critical Care Performed by: Noemi Chapel, MD Authorized by: Noemi Chapel, MD   Critical care provider statement:    Critical care time (minutes):  35   Critical care time was exclusive of:  Separately billable procedures and treating other patients and teaching time   Critical care was necessary to treat or prevent imminent or life-threatening deterioration of the following conditions: Acute gastrointestinal bleed with severe anemia requiring transfusion.   Critical care was time spent personally by me on the following activities:  Blood draw for specimens, development of treatment plan with patient or surrogate, discussions with consultants, evaluation of patient's response to treatment, examination of patient, obtaining history from patient or surrogate, ordering and performing treatments and interventions, ordering and review of laboratory studies, ordering and review of radiographic studies, pulse oximetry, re-evaluation of patient's condition and review of old charts    (including critical care time)  Medications Ordered in ED Medications  0.9 %  sodium chloride infusion ( Intravenous New Bag/Given 11/12/18 1412)  0.9 %  sodium chloride infusion (Manually program via Guardrails IV Fluids) (has no administration in time range)  pantoprazole (PROTONIX) 80 mg in sodium chloride 0.9 % 100  mL IVPB (has no administration in time range)  pantoprazole (PROTONIX) 80 mg in sodium chloride 0.9 % 250 mL (0.32 mg/mL) infusion (has no administration in time range)  famotidine (PEPCID) IVPB 20 mg premix (0 mg Intravenous Stopped 11/12/18 1520)  pantoprazole (PROTONIX) EC tablet 40 mg (40 mg Oral Given 11/12/18 1409)     Initial Impression / Assessment and Plan / ED Course  I have reviewed the triage vital signs and the nursing notes.  Pertinent labs & imaging results that were available during my care of the patient were reviewed by me and considered in my medical decision making (see chart for details).  Clinical Course as of Nov 12 1640  Thu Nov 12, 2018  1455 Hemoglobin is very low at 7.2, the patient will likely need to be transfused.   [BM]  1455 The BUN is elevated at 36 with a creatinine of 2.33 which is also higher than his baseline.   [BM]  1455 We will consult with gastroenterology and admit to the hospitalist.   [BM]  1500 Dr. Gala Romney has returned the call and will see the patient in consultation   [BM]    Clinical Course User Index [BM] Noemi Chapel, MD    The patient has an abnormal exam, his history with being anticoagulated and now having black stools suggest an upper GI bleed.  We will proceed with Pepcid, proton pump inhibitor, likely needs admission to the hospital until the bleeding stops.  He has held his Plavix and aspirin last night because of this.  The patient has both gross blood and dark-colored melena in the rectal vault.  He will likely need to be admitted to the hospital.  He is in agreement with this plan.  He is mildly tachycardic but  not hypotensive.  I discussed the care with Dr. Ernestina Patches who has been kind enough to admit the patient to the hospital.  Final Clinical Impressions(s) / ED Diagnoses   Final diagnoses:  Severe anemia  UGI bleed  AKI (acute kidney injury) ([place])      Noemi Chapel, MD 11/12/18 [phone]

## 2018-11-12 NOTE — ED Notes (Signed)
Patient ambulatory to room.  

## 2018-11-13 ENCOUNTER — Other Ambulatory Visit: Payer: Self-pay

## 2018-11-13 ENCOUNTER — Inpatient Hospital Stay (HOSPITAL_COMMUNITY): Payer: Medicare HMO | Admitting: Anesthesiology

## 2018-11-13 ENCOUNTER — Encounter (HOSPITAL_COMMUNITY): Payer: Self-pay | Admitting: *Deleted

## 2018-11-13 ENCOUNTER — Encounter (HOSPITAL_COMMUNITY)
Admission: EM | Disposition: A | Discharge status: Home / Self Care | Payer: Self-pay | Source: Home / Self Care | Attending: Internal Medicine

## 2018-11-13 DIAGNOSIS — K2980 Duodenitis without bleeding: Secondary | ICD-10-CM

## 2018-11-13 DIAGNOSIS — K921 Melena: Secondary | ICD-10-CM

## 2018-11-13 DIAGNOSIS — D62 Acute posthemorrhagic anemia: Secondary | ICD-10-CM

## 2018-11-13 DIAGNOSIS — N179 Acute kidney failure, unspecified: Secondary | ICD-10-CM

## 2018-11-13 DIAGNOSIS — K922 Gastrointestinal hemorrhage, unspecified: Secondary | ICD-10-CM | POA: Diagnosis present

## 2018-11-13 DIAGNOSIS — N183 Chronic kidney disease, stage 3 (moderate): Secondary | ICD-10-CM

## 2018-11-13 DIAGNOSIS — I739 Peripheral vascular disease, unspecified: Secondary | ICD-10-CM

## 2018-11-13 DIAGNOSIS — K449 Diaphragmatic hernia without obstruction or gangrene: Secondary | ICD-10-CM

## 2018-11-13 HISTORY — PX: ESOPHAGOGASTRODUODENOSCOPY (EGD) WITH PROPOFOL: SHX5813

## 2018-11-13 HISTORY — PX: BIOPSY: SHX5522

## 2018-11-13 LAB — CBC
HCT: 28.2 % — ABNORMAL LOW (ref 39.0–52.0)
HEMOGLOBIN: 8.8 g/dL — AB (ref 13.0–17.0)
MCH: 29.5 pg (ref 26.0–34.0)
MCHC: 31.2 g/dL (ref 30.0–36.0)
MCV: 94.6 fL (ref 80.0–100.0)
Platelets: 193 10*3/uL (ref 150–400)
RBC: 2.98 MIL/uL — ABNORMAL LOW (ref 4.22–5.81)
RDW: 14.7 % (ref 11.5–15.5)
WBC: 7.8 10*3/uL (ref 4.0–10.5)
nRBC: 0 % (ref 0.0–0.2)

## 2018-11-13 LAB — COMPREHENSIVE METABOLIC PANEL
ALBUMIN: 3 g/dL — AB (ref 3.5–5.0)
ALT: 10 U/L (ref 0–44)
AST: 14 U/L — ABNORMAL LOW (ref 15–41)
Alkaline Phosphatase: 79 U/L (ref 38–126)
Anion gap: 6 (ref 5–15)
BUN: 28 mg/dL — ABNORMAL HIGH (ref 8–23)
CO2: 18 mmol/L — ABNORMAL LOW (ref 22–32)
Calcium: 8.2 mg/dL — ABNORMAL LOW (ref 8.9–10.3)
Chloride: 115 mmol/L — ABNORMAL HIGH (ref 98–111)
Creatinine, Ser: 1.84 mg/dL — ABNORMAL HIGH (ref 0.61–1.24)
GFR calc Af Amer: 41 mL/min — ABNORMAL LOW (ref >60)
GFR calc non Af Amer: 36 mL/min — ABNORMAL LOW (ref >60)
Glucose, Bld: 89 mg/dL (ref 70–99)
POTASSIUM: 4.7 mmol/L (ref 3.5–5.1)
Sodium: 139 mmol/L (ref 135–145)
Total Bilirubin: 0.5 mg/dL (ref 0.3–1.2)
Total Protein: 5.5 g/dL — ABNORMAL LOW (ref 6.5–8.1)

## 2018-11-13 LAB — LACTIC ACID, PLASMA: Lactic Acid, Venous: 0.5 mmol/L (ref 0.5–1.9)

## 2018-11-13 SURGERY — ESOPHAGOGASTRODUODENOSCOPY (EGD) WITH PROPOFOL
Anesthesia: Monitor Anesthesia Care

## 2018-11-13 MED ORDER — LACTATED RINGERS IV SOLN: INTRAVENOUS | Status: DC

## 2018-11-13 MED ORDER — PROPOFOL 10 MG/ML IV BOLUS
INTRAVENOUS | Status: DC | PRN
  Administered 2018-11-13 (×2): 15 mg via INTRAVENOUS
  Administered 2018-11-13: 20 mg via INTRAVENOUS
  Administered 2018-11-13: 15 mg via INTRAVENOUS

## 2018-11-13 MED ORDER — PROMETHAZINE HCL 25 MG/ML IJ SOLN: 6.2500 mg | INTRAMUSCULAR | Status: DC | PRN

## 2018-11-13 MED ORDER — LACTATED RINGERS IV SOLN
INTRAVENOUS | Status: DC | PRN
  Administered 2018-11-13: 11:00:00 via INTRAVENOUS

## 2018-11-13 MED ORDER — KETAMINE HCL 50 MG/5ML IJ SOSY
PREFILLED_SYRINGE | INTRAMUSCULAR | Status: AC
  Filled 2018-11-13: qty 5

## 2018-11-13 MED ORDER — OXYCODONE HCL 5 MG PO TABS
5.0000 mg | ORAL_TABLET | Freq: Four times a day (QID) | ORAL | Status: DC | PRN
  Administered 2018-11-13: 5 mg via ORAL
  Filled 2018-11-13: qty 1

## 2018-11-13 MED ORDER — PROPOFOL 10 MG/ML IV BOLUS
INTRAVENOUS | Status: AC
  Filled 2018-11-13: qty 40

## 2018-11-13 MED ORDER — OXYCODONE HCL 5 MG PO TABS
10.0000 mg | ORAL_TABLET | ORAL | Status: DC | PRN
  Administered 2018-11-13 – 2018-11-14 (×3): 10 mg via ORAL
  Filled 2018-11-13 (×3): qty 2

## 2018-11-13 MED ORDER — STERILE WATER FOR IRRIGATION IR SOLN
Status: DC | PRN
  Administered 2018-11-13: 100 mL

## 2018-11-13 MED ORDER — ACETAMINOPHEN 325 MG PO TABS: 650.0000 mg | ORAL_TABLET | Freq: Four times a day (QID) | ORAL | Status: DC | PRN

## 2018-11-13 MED ORDER — HYDROCODONE-ACETAMINOPHEN 7.5-325 MG PO TABS: 1.0000 | ORAL_TABLET | Freq: Once | ORAL | Status: DC | PRN

## 2018-11-13 MED ORDER — PROPOFOL 500 MG/50ML IV EMUL
INTRAVENOUS | Status: DC | PRN
  Administered 2018-11-13: 125 ug/kg/min via INTRAVENOUS

## 2018-11-13 MED ORDER — HYDROMORPHONE HCL 1 MG/ML IJ SOLN
0.2500 mg | INTRAMUSCULAR | Status: DC | PRN
  Administered 2018-11-13: 0.5 mg via INTRAVENOUS
  Filled 2018-11-13: qty 0.5

## 2018-11-13 MED ORDER — MEPERIDINE HCL 100 MG/ML IJ SOLN: 6.2500 mg | INTRAMUSCULAR | Status: DC | PRN

## 2018-11-13 MED ORDER — PANTOPRAZOLE SODIUM 40 MG PO TBEC
40.0000 mg | DELAYED_RELEASE_TABLET | Freq: Every day | ORAL | Status: DC
  Administered 2018-11-14: 40 mg via ORAL
  Filled 2018-11-13: qty 1

## 2018-11-13 MED ORDER — PEG 3350-KCL-NA BICARB-NACL 420 G PO SOLR
4000.0000 mL | Freq: Once | ORAL | Status: AC
  Filled 2018-11-13: qty 4000

## 2018-11-13 MED ORDER — PANTOPRAZOLE SODIUM 40 MG IV SOLR
INTRAVENOUS | Status: AC
  Filled 2018-11-13: qty 80

## 2018-11-13 MED ORDER — SODIUM CHLORIDE 0.9% FLUSH
INTRAVENOUS | Status: AC
  Filled 2018-11-13: qty 10

## 2018-11-13 NOTE — Progress Notes (Addendum)
Patient has completed colonoscopy  (golytely) prep, but still bloody with stool.  Notified GI MD, received order for tap water enemas to be given at different times.  Will continue to monitor patient.

## 2018-11-13 NOTE — Progress Notes (Signed)
Request for bed that was placed yesterday by ER staff, request for stepdown bed was placed. No beds available. Talked with Dr Dyann Kief about routine bed admission. Pt condition discussed. Order given for regular bed admission. Order placed and new bed request sent. Notified bed control in [place] of update. Voiced understanding.

## 2018-11-13 NOTE — Anesthesia Preprocedure Evaluation (Signed)
Anesthesia Evaluation    Airway Mallampati: II       Dental  (+) Partial Lower, Upper Dentures   Pulmonary Current Smoker,    breath sounds clear to auscultation       Cardiovascular + Peripheral Vascular Disease  + Valvular Problems/Murmurs  Rhythm:regular     Neuro/Psych    GI/Hepatic GERD  ,  Endo/Other    Renal/GU CRF     Musculoskeletal   Abdominal   Peds  Hematology   Anesthesia Other Findings Active GI bleed s/p 2 units PRBC overnight Confirms NPO status Chronic/ongoing bladder and kidney problems  Reproductive/Obstetrics                             Anesthesia Physical Anesthesia Plan  ASA: IV  Anesthesia Plan: MAC   Post-op Pain Management:    Induction:   PONV Risk Score and Plan:   Airway Management Planned:   Additional Equipment:   Intra-op Plan:   Post-operative Plan:   Informed Consent:   Plan Discussed with: Anesthesiologist  Anesthesia Plan Comments:         Anesthesia Quick Evaluation

## 2018-11-13 NOTE — Addendum Note (Signed)
Addendum  created 11/13/18 1236 by Ollen Bowl, CRNA   Charge Capture section accepted

## 2018-11-13 NOTE — Progress Notes (Signed)
Waiting on room availability. Wife wants to be with pt. Pt moves to pre-op room 3. Wife at side. Belongings given to wife.

## 2018-11-13 NOTE — Transfer of Care (Signed)
Immediate Anesthesia Transfer of Care Note  Patient: Jeffery Ponce  Procedure(s) Performed: ESOPHAGOGASTRODUODENOSCOPY (EGD) WITH PROPOFOL (N/A ) BIOPSY  Patient Location: PACU  Anesthesia Type:MAC  Level of Consciousness: awake, alert  and oriented  Airway & Oxygen Therapy: Patient Spontanous Breathing  Post-op Assessment: Report given to RN  Post vital signs: Reviewed and stable  Last Vitals:  Vitals Value Taken Time  BP 105/52 11/13/2018 12:15 PM  Temp    Pulse 73 11/13/2018 12:18 PM  Resp 18 11/13/2018 12:18 PM  SpO2 95 % 11/13/2018 12:18 PM  Vitals shown include unvalidated device data.  Last Pain:  Vitals:   11/13/18 1156  TempSrc:   PainSc: 0-No pain      Patients Stated Pain Goal: 3 (28/00/34 9179)  Complications: No apparent anesthesia complications

## 2018-11-13 NOTE — Anesthesia Postprocedure Evaluation (Signed)
Anesthesia Post Note  Patient: Jeffery Ponce  Procedure(s) Performed: ESOPHAGOGASTRODUODENOSCOPY (EGD) WITH PROPOFOL (N/A ) BIOPSY  Patient location during evaluation: PACU Anesthesia Type: MAC Level of consciousness: awake and alert and oriented Pain management: pain level controlled Vital Signs Assessment: post-procedure vital signs reviewed and stable Respiratory status: spontaneous breathing Cardiovascular status: stable Postop Assessment: no apparent nausea or vomiting Anesthetic complications: no     Last Vitals:  Vitals:   11/13/18 1100 11/13/18 1103  BP:    Pulse: 64   Resp:    Temp:  36.9 C  SpO2:      Last Pain:  Vitals:   11/13/18 1156  TempSrc:   PainSc: 0-No pain                 Ambrosio Reuter

## 2018-11-13 NOTE — Progress Notes (Signed)
Patient receiving colon prep for colonoscopy in a.m started by endoscopy. Patient complaining of loose bloody stools. Patient concerned and does not want to resume  Colon prep. MD has been notified. Patient has less than 1/3 prep left to consume. Will continue to monitor patient's Bowel movements and inform oncoming RN.

## 2018-11-13 NOTE — H&P (Signed)
Primary Care Physician:  [facility] [place] Primary Gastroenterologist:  Dr. Oneida Alar  Pre-Procedure History & Physical: HPI:  Jeffery Ponce is a 72 y.o. male here for MELENA/brbpr on ASA/PLAVIX-NO PPI.  Past Medical History:  Diagnosis Date  . Arthritis   . Chronic renal disease, stage 3, moderately decreased glomerular filtration rate (GFR) between 30-59 mL/min/1.73 square meter (HCC) 11/28/2017  . Heart murmur   . History of kidney stones   . Hypercholesteremia   . Urothelial carcinoma ([place]) 12/10/2016  . 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 CARDIAC CATHETERIZATION    . CYSTOSCOPY WITH INJECTION N/A 07/30/2017   Procedure: CYSTOSCOPY WITH INJECTION OF INDOCYANINE GREEN DYE;  Surgeon: Alexis Frock, MD;  [facility];  Service: Urology;  Laterality: N/A;  . FEMUR FRACTURE SURGERY Right   . LOWER EXTREMITY ANGIOGRAPHY Right 10/19/2018   Procedure: LOWER EXTREMITY ANGIOGRAPHY;  Surgeon: Algernon Huxley, MD;  Location: [place] [facility];  Service: Cardiovascular;  Laterality: Right;  . MCT 3D RECONSTRUCTION ([facility] HX) Left    arm  . open reduction and internal fixation leg Right    hip and leg.  Marland Kitchen PORT-A-CATH REMOVAL N/A 07/20/2018   Procedure: REMOVAL PORT-A-CATH;  Surgeon: Aviva Signs, MD;  Location: [facility];  Service: General;  Laterality: N/A;  . PORTACATH PLACEMENT N/A 12/23/2016   Procedure: PLACEMENT OF TUNNELED CENTRAL VENOUS CATHETER RIGHT INTERNAL JUGULAR WITH SUBCUTANEOUS PORT;  Surgeon: Vickie Epley, MD;  Location: [facility];  Service: Vascular;  Laterality: N/A;  . reattatchment of left arm     from [place]  . TRANSURETHRAL RESECTION OF BLADDER TUMOR N/A 11/19/2016   Procedure: TRANSURETHRAL RESECTION OF BLADDER TUMOR (TURBT) WITH EPIRUBICIN INJECTION;  Surgeon: Franchot Gallo, MD;  Location: [facility];  Service: Urology;  Laterality: N/A;    Prior to Admission medications    Medication Sig Start Date End Date Taking? Authorizing Provider  aspirin EC 81 MG tablet Take 1 tablet (81 mg total) by mouth daily. Patient taking differently: Take 81 mg by mouth every morning.  10/19/18  Yes Dew, Erskine Squibb, MD  atorvastatin (LIPITOR) 10 MG tablet Take 1 tablet (10 mg total) by mouth daily. Patient taking differently: Take 10 mg by mouth every morning.  10/19/18 10/19/19 Yes Dew, Erskine Squibb, MD  cholecalciferol (VITAMIN D) 25 MCG (1000 UT) tablet Take 1,000 Units by mouth every morning.  10/02/18  Yes [provider]  clopidogrel (PLAVIX) 75 MG tablet Take 1 tablet (75 mg total) by mouth daily. Patient taking differently: Take 75 mg by mouth every morning.  10/19/18  Yes Dew, Erskine Squibb, MD  furosemide (LASIX) 20 MG tablet Take 20 mg by mouth every morning.  10/02/18  Yes [provider]  mirtazapine (REMERON) 15 MG tablet Take 15 mg by mouth at bedtime.  09/16/18  Yes [provider]  morphine (MS CONTIN) 15 MG 12 hr tablet Take 15 mg by mouth every 12 (twelve) hours.  09/24/18  Yes [provider]  Oxycodone HCl 10 MG TABS Take 1-2 tablets (10-20 mg total) by mouth every 4 (four) hours as needed (pain). Post-operatively Patient taking differently: Take 10 mg by mouth every 3 (three) hours as needed (pain). Post-operatively 08/05/17  Yes Alexis Frock, MD  sodium bicarbonate 650 MG tablet Take 650 mg by mouth 2 (two) times daily.   Yes [provider]    Allergies as of 11/12/2018 -  Review Complete 11/12/2018  Allergen Reaction Noted  . Penicillins Anaphylaxis   . Ciprofloxacin Itching 11/19/2016  . Ambien [zolpidem tartrate] Other (See Comments) 04/28/2017    Family History  Problem Relation Age of Onset  . Aneurysm Mother   . Diabetes Father   . Colon cancer Brother     Social History   Socioeconomic History  . Marital status: Married    Spouse name: Not on file  . Number of children: Not on file  . Years of education:  Not on file  . Highest education level: Not on file  Occupational History  . Not on file  Social Needs  . Financial resource strain: Not on file  . Food insecurity:    Worry: Not on file    Inability: Not on file  . Transportation needs:    Medical: Not on file    Non-medical: Not on file  Tobacco Use  . Smoking status: Current Every Day Smoker    Packs/day: 0.50    Years: 50.00    Pack years: 25.00    Types: Cigarettes  . Smokeless tobacco: Never Used  Substance and Sexual Activity  . Alcohol use: No    Frequency: Never  . Drug use: No  . Sexual activity: Never    Birth control/protection: None    Comment: married-30 years-2 children by first wife  Lifestyle  . Physical activity:    Days per week: Not on file    Minutes per session: Not on file  . Stress: Not on file  Relationships  . Social connections:    Talks on phone: Not on file    Gets together: Not on file    Attends religious service: Not on file    Active member of club or organization: Not on file    Attends meetings of clubs or organizations: Not on file    Relationship status: Not on file  . Intimate partner violence:    Fear of current or ex partner: Not on file    Emotionally abused: Not on file    Physically abused: Not on file    Forced sexual activity: Not on file  Other Topics Concern  . Not on file  Social History Narrative  . Not on file    Review of Systems: See HPI, otherwise negative ROS   Physical Exam: BP 123/66   Pulse 64   Temp (P) 98.4 F (36.9 C) (Oral)   Resp 15   Ht 5' 10.5" (1.791 m)   Wt 68.9 kg   SpO2 (!) 12%   BMI 21.50 kg/m  General:   Alert,  pleasant and cooperative in NAD Head:  Normocephalic and atraumatic. Neck:  Supple; Lungs:  Clear throughout to auscultation.    Heart:  Regular rate and rhythm. Abdomen:  Soft, nontender and nondistended. Normal bowel sounds, without guarding, and without rebound.   Neurologic:  Alert and  oriented x4;  grossly normal  neurologically.  Impression/Plan:     MELENA/brbpr  PLAN: 1. EGD TODAY DISCUSSED PROCEDURE, BENEFITS, & RISKS: < 1% chance of medication reaction, bleeding, OR perforation.

## 2018-11-13 NOTE — Op Note (Signed)
[facility] Patient Name: Jeffery Ponce Procedure Date: 11/13/2018 10:36 AM MRN: [id number] Date of Birth: 01-08-1946 Attending MD: Barney Drain MD, MD CSN: [id number] Age: 72 Admit Type: Outpatient Procedure:                Upper GI endoscopy WITH COLD FORCEPS BIOPSY Indications:              Hematochezia, Melena Providers:                Barney Drain MD, MD, Rosina Lowenstein, RN, Aram Candela Referring MD:             Norvel Richards, MD Medicines:                Propofol per Anesthesia Complications:            No immediate complications. Estimated Blood Loss:     Estimated blood loss was minimal. Procedure:                Pre-Anesthesia Assessment:                           - Prior to the procedure, a History and Physical                            was performed, and patient medications and                            allergies were reviewed. The patient's tolerance of                            previous anesthesia was also reviewed. The risks                            and benefits of the procedure and the sedation                            options and risks were discussed with the patient.                            All questions were answered, and informed consent                            was obtained. Prior Anticoagulants: The patient                            last took aspirin 1 day and Plavix (clopidogrel) 1                            day prior to the procedure. ASA Grade Assessment:                            II -A patient with mild systemic disease. After                            reviewing the risks and benefits, the patient was  deemed in satisfactory condition to undergo the                            procedure. After obtaining informed consent, the                            endoscope was passed under direct vision.                            Throughout the procedure, the patient's blood                            pressure, pulse, and  oxygen saturations were                            monitored continuously. The [id number]-[id number] ([id number])                            scope was introduced through the mouth, and                            advanced to the second part of duodenum. The upper                            GI endoscopy was accomplished without difficulty.                            The patient tolerated the procedure well. Scope In: 11:44:50 AM Scope Out: 11:53:44 AM Total Procedure Duration: 0 hours 8 minutes 54 seconds  Findings:      The examined esophagus was normal.      A small hiatal hernia was present.      Patchy mild inflammation was found in the gastric antrum. Biopsies were       taken with a cold forceps for Helicobacter pylori testing.      Patchy mild inflammation characterized by congestion (edema) and       erythema was found in the duodenal bulb.      The second portion of the duodenum was normal. Impression:               - NO SOURCE FOR BRBPR/MELENA FOUND.                           - Small hiatal hernia.                           - MILD Gastritis/Duodenitis. Moderate Sedation:      Per Anesthesia Care Recommendation:           - Return patient to hospital ward for ongoing care.                           - Clear liquid diet THEN NPO AFTER MN. PREP FOR                            COLONOSCOPY ON DEC 7.                           -  Continue present medications.                           - Await pathology results. Procedure Code(s):        --- Professional ---                           [phone], Esophagogastroduodenoscopy, flexible,                            transoral; with biopsy, single or multiple Diagnosis Code(s):        --- Professional ---                           K44.9, Diaphragmatic hernia without obstruction or                            gangrene                           K29.70, Gastritis, unspecified, without bleeding                           K29.80, Duodenitis without bleeding                            K92.1, Melena (includes Hematochezia) CPT copyright 2018 [employer]. All rights reserved. The codes documented in this report are preliminary and upon coder review may  be revised to meet current compliance requirements. Barney Drain, MD Barney Drain MD, MD 11/13/2018 12:11:18 PM This report has been signed electronically. Number of Addenda: 0

## 2018-11-13 NOTE — Progress Notes (Addendum)
PROGRESS NOTE    Jeffery Ponce  TGY:[id number] DOB: 03-Jul-1946 DOA: 11/12/2018 PCP: [facility]     Brief Narrative:  72 y.o. male with medical history significant of multiple medical problems including bladder cancer status post cystoprostatectomy August 2018, peripheral vascular disease status post stenting of lower extremity, stage III CKD, 1 pack/day smoking presenting with GI bleed and hypothermia.  Patient reports having melanotic stools over the past 2 weeks.  Patient noted to have had stents placed in his lower extremity arteries approximately 3 to 4 weeks ago.  Patient reports being on aspirin and Plavix since stent placement and believes that GI bleeding started around this time.  Denies any prior episodes of this in the past.  Currently not on a PPI.  Still smoking up to 1 pack/day.  Reports intermittent/sporadic alcohol use.  No fevers or chills.  No chest pain or shortness of breath.  No abdominal pain.  Has had progressive weakness.  Denies any bleeding from urostomy.    Assessment & Plan: 1-GIB (gastrointestinal bleeding): patient with acute blood loss anemia -BRBPR and melena -s/p transfusion -feeling better, even still weak -Hgb 8.8 -continue IV PPI -remains NPO -EGD later today -follow GI rec's  2-PVD -continue holding plavix and aspirin in the setting of acute bleeding  -outpatient follow up with vascular surgery  3-hypothermia -most likely due to anemia and acute blood loss -no source of infection identified -continue monitoring and holding on antibiotics.  4-hx of bladder cancer -appears to be in remission -continue outpatient follow up  5-CKD stage 3 -Cr improved after IVF's and blood transfusion -Cr now 1.8, back to baseline -will follow trend   6-HLD -will resume lipitor at discharge  7-   DVT prophylaxis: SCD's Code Status: Full Family Communication: no family at bedside  Disposition Plan: remains inpatient;  continue IV PPI; EGD later today.  Consultants:   GI  Procedures:   Planned EGD later today  Antimicrobials:  Anti-infectives (From admission, onward)   None       Subjective: Afebrile, denies nausea or vomiting. Reported feeling weak.   Objective: Vitals:   11/13/18 1103 11/13/18 1208 11/13/18 1215 11/13/18 1230  BP:  (!) 97/52 (!) 105/52 122/61  Pulse:  75 71 72  Resp:  16 18 15   Temp: 98.4 F (36.9 C) 97.7 F (36.5 C)    TempSrc: Oral     SpO2:  94% 94% 94%  Weight:      Height:        Intake/Output Summary (Last 24 hours) at 11/13/2018 1233 Last data filed at 11/13/2018 1219 Gross per 24 hour  Intake 1960 ml  Output 0 ml  Net 1960 ml   Filed Weights   11/12/18 1107  Weight: 68.9 kg    Examination: General exam: Alert, awake, oriented x 3; no CP, no Nausea or vomiting. Respiratory system: Clear to auscultation. Respiratory effort normal. Cardiovascular system:RRR. No murmurs, rubs, gallops. Gastrointestinal system: Abdomen is nondistended, soft and nontender. No organomegaly or masses felt. Normal bowel sounds heard. Central nervous system: Alert and oriented. No focal neurological deficits. Extremities: No cyanosis, no clubbing Skin: No rashes, lesions or ulcers Psychiatry: Judgement and insight appear normal. Mood & affect appropriate.     Data Reviewed: I have personally reviewed following labs and imaging studies  CBC: Recent Labs  Lab 11/12/18 1351 11/13/18 0630  WBC 12.9* 7.8  HGB 7.2* 8.8*  HCT 24.3* 28.2*  MCV 97.2 94.6  PLT 320  620   Basic Metabolic Panel: Recent Labs  Lab 11/12/18 1351 11/13/18 0630  NA 139 139  K 4.4 4.7  CL 110 115*  CO2 22 18*  GLUCOSE 98 89  BUN 36* 28*  CREATININE 2.33* 1.84*  CALCIUM 8.4* 8.2*   GFR: Estimated Creatinine Clearance: 34.8 mL/min (A) (by C-G formula based on SCr of 1.84 mg/dL (H)).   Liver Function Tests: Recent Labs  Lab 11/12/18 1351 11/13/18 0630  AST 19 14*  ALT 11 10    ALKPHOS 86 79  BILITOT 0.2* 0.5  PROT 6.5 5.5*  ALBUMIN 3.5 3.0*   Thyroid Function Tests: Recent Labs    11/12/18 1351  TSH 2.790    Recent Results (from the past 240 hour(s))  Culture, blood (routine x 2)     Status: None (Preliminary result)   Collection Time: 11/12/18  5:43 PM  Result Value Ref Range Status   Specimen Description BLOOD BLOOD LEFT WRIST  Final   Special Requests   Final    BOTTLES DRAWN AEROBIC AND ANAEROBIC Blood Culture adequate volume   Culture   Final    NO GROWTH < 24 HOURS Performed at [facility], [address] [place], [place] [address]    Report Status PENDING  Incomplete  Culture, blood (routine x 2)     Status: None (Preliminary result)   Collection Time: 11/12/18  5:47 PM  Result Value Ref Range Status   Specimen Description BLOOD LEFT ANTECUBITAL  Final   Special Requests   Final    BOTTLES DRAWN AEROBIC AND ANAEROBIC Blood Culture adequate volume   Culture   Final    NO GROWTH < 24 HOURS Performed at [facility], [address] [place], [place] [address]    Report Status PENDING  Incomplete     Scheduled Meds: Continuous Infusions: . sodium chloride 75 mL/hr at 11/13/18 0634  . lactated ringers    . lactated ringers    . pantoprozole (PROTONIX) infusion 8 mg/hr (11/13/18 0510)     LOS: 1 day    Time spent: 30 minutes   Barton Dubois, MD [facility] Pager [phone]  If 7PM-7AM, please contact night-coverage [email or web address] Password [email or web address] 11/13/2018, 12:33 PM

## 2018-11-13 NOTE — Progress Notes (Signed)
Patient has received 2 units PRBC with increase in hgb from 7.2 to 8.8. Planned EGD today on propofol.  Proceed with EGD with Dr. Oneida Alar in near future: the risks, benefits, and alternatives have been discussed with the patient in detail. The patient states understanding and desires to proceed.  The patient is on oxycodone and morphine. Plavix held. No other anticoagulants, anxiolytics, chronic pain medications, or antidepressants. Will plan for porpofol/MAC to promote adequate sedation.   Thank you for allowing [place] to participate in the care of Jeffery Ponce  Pegah Segel, DNP, AGNP-C Adult & Gerontological Nurse Practitioner [facility]

## 2018-11-13 NOTE — ED Notes (Signed)
Phleb in room  

## 2018-11-14 ENCOUNTER — Inpatient Hospital Stay (HOSPITAL_COMMUNITY): Payer: Medicare HMO | Admitting: Anesthesiology

## 2018-11-14 ENCOUNTER — Other Ambulatory Visit: Payer: Self-pay

## 2018-11-14 ENCOUNTER — Encounter (HOSPITAL_COMMUNITY)
Admission: EM | Disposition: A | Discharge status: Home / Self Care | Payer: Self-pay | Source: Home / Self Care | Attending: Internal Medicine

## 2018-11-14 ENCOUNTER — Encounter (HOSPITAL_COMMUNITY): Payer: Self-pay

## 2018-11-14 DIAGNOSIS — D122 Benign neoplasm of ascending colon: Secondary | ICD-10-CM

## 2018-11-14 DIAGNOSIS — K621 Rectal polyp: Secondary | ICD-10-CM

## 2018-11-14 DIAGNOSIS — K921 Melena: Principal | ICD-10-CM

## 2018-11-14 DIAGNOSIS — K648 Other hemorrhoids: Secondary | ICD-10-CM

## 2018-11-14 DIAGNOSIS — K5791 Diverticulosis of intestine, part unspecified, without perforation or abscess with bleeding: Secondary | ICD-10-CM

## 2018-11-14 DIAGNOSIS — K5520 Angiodysplasia of colon without hemorrhage: Secondary | ICD-10-CM

## 2018-11-14 HISTORY — PX: POLYPECTOMY: SHX5525

## 2018-11-14 HISTORY — PX: COLONOSCOPY WITH PROPOFOL: SHX5780

## 2018-11-14 LAB — URINE CULTURE: Culture: >=100000 — AB

## 2018-11-14 LAB — COMPREHENSIVE METABOLIC PANEL
ALBUMIN: 2.9 g/dL — AB (ref 3.5–5.0)
ALT: 9 U/L (ref 0–44)
ANION GAP: 7 (ref 5–15)
AST: 13 U/L — ABNORMAL LOW (ref 15–41)
Alkaline Phosphatase: 75 U/L (ref 38–126)
BUN: 21 mg/dL (ref 8–23)
CO2: 17 mmol/L — ABNORMAL LOW (ref 22–32)
Calcium: 8.2 mg/dL — ABNORMAL LOW (ref 8.9–10.3)
Chloride: 115 mmol/L — ABNORMAL HIGH (ref 98–111)
Creatinine, Ser: 1.64 mg/dL — ABNORMAL HIGH (ref 0.61–1.24)
GFR calc Af Amer: 47 mL/min — ABNORMAL LOW (ref >60)
GFR calc non Af Amer: 41 mL/min — ABNORMAL LOW (ref >60)
Glucose, Bld: 83 mg/dL (ref 70–99)
Potassium: 4.5 mmol/L (ref 3.5–5.1)
Sodium: 139 mmol/L (ref 135–145)
Total Bilirubin: 0.6 mg/dL (ref 0.3–1.2)
Total Protein: 5.3 g/dL — ABNORMAL LOW (ref 6.5–8.1)

## 2018-11-14 LAB — CBC
HCT: 27.4 % — ABNORMAL LOW (ref 39.0–52.0)
HEMOGLOBIN: 8.4 g/dL — AB (ref 13.0–17.0)
MCH: 29.5 pg (ref 26.0–34.0)
MCHC: 30.7 g/dL (ref 30.0–36.0)
MCV: 96.1 fL (ref 80.0–100.0)
Platelets: 176 10*3/uL (ref 150–400)
RBC: 2.85 MIL/uL — ABNORMAL LOW (ref 4.22–5.81)
RDW: 14.3 % (ref 11.5–15.5)
WBC: 6.6 10*3/uL (ref 4.0–10.5)
nRBC: 0 % (ref 0.0–0.2)

## 2018-11-14 LAB — PREPARE RBC (CROSSMATCH)

## 2018-11-14 SURGERY — COLONOSCOPY WITH PROPOFOL
Anesthesia: Monitor Anesthesia Care

## 2018-11-14 MED ORDER — PROPOFOL 10 MG/ML IV BOLUS
INTRAVENOUS | Status: AC
  Filled 2018-11-14: qty 20

## 2018-11-14 MED ORDER — SODIUM CHLORIDE 0.9 % IV SOLN
INTRAVENOUS | Status: DC
  Administered 2018-11-14: 13:00:00 via INTRAVENOUS

## 2018-11-14 MED ORDER — PANTOPRAZOLE SODIUM 40 MG PO TBEC: 40.0000 mg | DELAYED_RELEASE_TABLET | Freq: Every day | ORAL | 1 refills | Status: DC

## 2018-11-14 MED ORDER — MEPERIDINE HCL 100 MG/ML IJ SOLN: 6.2500 mg | INTRAMUSCULAR | Status: DC | PRN

## 2018-11-14 MED ORDER — HYDROCODONE-ACETAMINOPHEN 7.5-325 MG PO TABS: 1.0000 | ORAL_TABLET | Freq: Once | ORAL | Status: DC | PRN

## 2018-11-14 MED ORDER — LACTATED RINGERS IV SOLN: INTRAVENOUS | Status: DC

## 2018-11-14 MED ORDER — POLYETHYLENE GLYCOL 3350 17 G PO PACK: 17.0000 g | PACK | Freq: Every day | ORAL | 1 refills | Status: DC

## 2018-11-14 MED ORDER — PROPOFOL 10 MG/ML IV BOLUS
INTRAVENOUS | Status: DC | PRN
  Administered 2018-11-14: 40 mg via INTRAVENOUS
  Administered 2018-11-14 (×3): 20 mg via INTRAVENOUS
  Administered 2018-11-14: 30 mg via INTRAVENOUS
  Administered 2018-11-14: 20 mg via INTRAVENOUS
  Administered 2018-11-14: 30 mg via INTRAVENOUS
  Administered 2018-11-14: 40 mg via INTRAVENOUS
  Administered 2018-11-14 (×2): 20 mg via INTRAVENOUS
  Administered 2018-11-14: 10 mg via INTRAVENOUS
  Administered 2018-11-14: 40 mg via INTRAVENOUS
  Administered 2018-11-14 (×6): 20 mg via INTRAVENOUS
  Administered 2018-11-14: 10 mg via INTRAVENOUS
  Administered 2018-11-14: 30 mg via INTRAVENOUS
  Administered 2018-11-14 (×3): 20 mg via INTRAVENOUS
  Administered 2018-11-14: 40 mg via INTRAVENOUS
  Administered 2018-11-14: 20 mg via INTRAVENOUS
  Administered 2018-11-14: 40 mg via INTRAVENOUS

## 2018-11-14 MED ORDER — HYDROMORPHONE HCL 1 MG/ML IJ SOLN: 0.2500 mg | INTRAMUSCULAR | Status: DC | PRN

## 2018-11-14 MED ORDER — PROMETHAZINE HCL 25 MG/ML IJ SOLN: 6.2500 mg | INTRAMUSCULAR | Status: DC | PRN

## 2018-11-14 NOTE — Discharge Summary (Signed)
Physician Discharge Summary  Jeffery Ponce RJJ:[id number] DOB: October 01, 1946 DOA: 11/12/2018  PCP: [facility] date: 11/12/2018 Discharge date: 11/14/2018  Time spent: 35 minutes  Recommendations for Outpatient Follow-up:  1. Repeat basic metabolic panel to follow electrolytes and renal function 2. Repeat CBC to follow hemoglobin trend  Discharge Diagnoses:  Acute blood loss anemia Peripheral vascular disease Hyperlipidemia Gastroesophageal reflux disease History of bladder cancer Chronic pain syndrome Chronic kidney disease is stage III  Discharge Condition: Stable and improved.  Patient discharged home with instruction to follow-up with PCP in 10 days.  Diet recommendation: Heart healthy (a low residue diet).  Filed Weights   11/12/18 1107 11/13/18 1455  Weight: 68.9 kg 73.8 kg    History of present illness:  As per H&P written by Dr. Ernestina Patches on 11/12/2018 72 y.o.malewith medical history significant ofmultiple medical problems including bladder cancer status post cystoprostatectomy August 2018, peripheral vascular disease status post stenting of lower extremity, stage III CKD,1 pack/day smoking presenting with GI bleed and hypothermia. Patient reports having melanotic stools over the past 2 weeks. Patient noted to have had stents placed in his lower extremity arteries approximately 3 to 4 weeks ago. Patient reports being on aspirin and Plavix since stent placement and believes that GI bleeding started around this time. Denies any prior episodes of this in the past. Currently not on a PPI. Still smoking up to 1 pack/day. Reports intermittent/sporadic alcohol use. No fevers or chills. No chest pain or shortness of breath. No abdominal pain. Has had progressive weakness. Denies any bleeding from urostomy.   Hospital Course:  1-GIB (gastrointestinal bleeding): patient with acute blood loss anemia -BRBPR and melena -s/p  transfusion -feeling better, and asking to go home -Hgb stable at 8.4 at discharge -No further active bleeding seen -Endoscopy negative and colonoscopy without signs of active bleeding. -Repeat CBC to continue following hemoglobin trend at follow-up visits -Patient discharged on oral PPI on daily basis -Bleeding most likely associated with diverticulosis and ABN as seen on colonoscopy -Advised to maintain adequate hydration and to use MiraLAX.  2-PVD -GI service recommendations safe to resume aspirin and Plavix at discharge. -outpatient follow up with vascular surgery  3-hypothermia -most likely due to anemia and acute blood loss -no source of infection identified -No further normalities stable vital signs at discharge -No antibiotics given.  4-hx of bladder cancer -appears to be in remission -continue outpatient follow up  5-CKD stage 3 -Cr improved after IVF's and blood transfusion -Cr now 1.8, back to baseline -will recommend repeat basic metabolic panel to follow renal function trend a follow-up visit.  6-HLD -will resume lipitor at discharge  7-chronic pain syndrome -Safe to resume home analgesic regimen.  Procedures:  Endoscopy: No signs of acute bleeding appreciated  Colonoscopy: Positive diverticulosis and AVM without acute bleeding appreciated.  Consultations:  GI  Discharge Exam: Vitals:   11/14/18 1050 11/14/18 1434  BP: 117/66 (!) 136/53  Pulse: 70 74  Resp: 18 18  Temp: 98.3 F (36.8 C) 98.1 F (36.7 C)  SpO2: 97% 97%   General exam: Alert, awake, oriented x 3; no CP, no Nausea or vomiting.  Respiratory system: Clear to auscultation. Respiratory effort normal. Cardiovascular system:RRR. No murmurs, rubs, gallops. Gastrointestinal system: Abdomen is nondistended, soft and nontender. No organomegaly or masses felt. Normal bowel sounds heard. Central nervous system: Alert and oriented. No focal neurological deficits. Extremities: No  cyanosis, no clubbing Skin: No rashes, lesions or ulcers Psychiatry: Judgement  and insight appear normal. Mood & affect appropriate.   Discharge Instructions   Discharge Instructions    Diet - low sodium heart healthy   Complete by:  As directed    Discharge instructions   Complete by:  As directed    Follow heart healthy diet Take medications as prescribed Maintain adequate hydration Arrange follow-up with PCP in 10 days.     Allergies as of 11/14/2018      Reactions   Penicillins Anaphylaxis   Has patient had a PCN reaction causing immediate rash, facial/tongue/throat swelling, SOB or lightheadedness with hypotension:Yes Has patient had a PCN reaction causing severe rash involving mucus membranes or skin necrosis:Yes Has patient had a PCN reaction that required hospitalization:No Has patient had a PCN reaction occurring within the last 10 years:No If all of the above answers are "NO", then may proceed with Cephalosporin use.   Ciprofloxacin Itching   Itching at IV site. No hives or shortness of breathe.   Ambien [zolpidem Tartrate] Other (See Comments)   "Sleep walking"      Medication List    TAKE these medications   aspirin EC 81 MG tablet Take 1 tablet (81 mg total) by mouth daily. What changed:  when to take this   atorvastatin 10 MG tablet Commonly known as:  LIPITOR Take 1 tablet (10 mg total) by mouth daily. What changed:  when to take this   cholecalciferol 25 MCG (1000 UT) tablet Commonly known as:  VITAMIN D Take 1,000 Units by mouth every morning.   clopidogrel 75 MG tablet Commonly known as:  PLAVIX Take 1 tablet (75 mg total) by mouth daily. What changed:  when to take this   furosemide 20 MG tablet Commonly known as:  LASIX Take 20 mg by mouth every morning.   mirtazapine 15 MG tablet Commonly known as:  REMERON Take 15 mg by mouth at bedtime.   morphine 15 MG 12 hr tablet Commonly known as:  MS CONTIN Take 15 mg by mouth every 12 (twelve)  hours.   Oxycodone HCl 10 MG Tabs Take 1-2 tablets (10-20 mg total) by mouth every 4 (four) hours as needed (pain). Post-operatively What changed:    how much to take  when to take this   pantoprazole 40 MG tablet Commonly known as:  PROTONIX Take 1 tablet (40 mg total) by mouth daily before breakfast. Start taking on:  11/15/2018   polyethylene glycol packet Commonly known as:  MIRALAX / GLYCOLAX Take 17 g by mouth daily.   sodium bicarbonate 650 MG tablet Take 650 mg by mouth 2 (two) times daily.      Allergies  Allergen Reactions  . Penicillins Anaphylaxis    Has patient had a PCN reaction causing immediate rash, facial/tongue/throat swelling, SOB or lightheadedness with hypotension:Yes Has patient had a PCN reaction causing severe rash involving mucus membranes or skin necrosis:Yes Has patient had a PCN reaction that required hospitalization:No Has patient had a PCN reaction occurring within the last 10 years:No If all of the above answers are "NO", then may proceed with Cephalosporin use.   . Ciprofloxacin Itching    Itching at IV site. No hives or shortness of breathe.  . Ambien [Zolpidem Tartrate] Other (See Comments)    "Sleep walking"    The results of significant diagnostics from this hospitalization (including imaging, microbiology, ancillary and laboratory) are listed below for reference.    Significant Diagnostic Studies: Ct Chest W Contrast  Result Date: 10/20/2018 CLINICAL DATA:  Restaging of bladder urothelial cancer. Prior cystoprostatectomy. Lower extremity stent placement yesterday EXAM: CT CHEST, ABDOMEN, AND PELVIS WITH CONTRAST TECHNIQUE: Multidetector CT imaging of the chest, abdomen and pelvis was performed following the standard protocol during bolus administration of intravenous contrast. CONTRAST:  75mL ISOVUE-300 IOPAMIDOL (ISOVUE-300) INJECTION 61% COMPARISON:  Multiple exams, including 04/20/2018 CT FINDINGS: CT CHEST FINDINGS Cardiovascular:  Coronary, aortic arch, and branch vessel atherosclerotic vascular disease. Mediastinum/Nodes: Right eccentric subcarinal node 0.7 cm in short axis. No significant esophageal abnormality observed. Lungs/Pleura: Centrilobular emphysema. Stable appearance of the nodule along the minor fissure, 0.6 cm in diameter on image 88/3. Stable 4 mm right lower lobe nodule, image 86/3, partially obscured by atelectasis on the prior exam. Faint scattered subtle nodularity in the periphery of the lungs as on prior exams. Musculoskeletal: Unremarkable CT ABDOMEN PELVIS FINDINGS Hepatobiliary: 6 mm hepatic cysts in segment 4 of the liver, image 58/2. 2.0 cm gallstone in the gallbladder. Mildly contracted gallbladder. No biliary dilatation. Pancreas: Unremarkable Spleen: Unremarkable Adrenals/Urinary Tract: Stable thickening of the left adrenal gland. Stable hypodense right renal lesions along the renal periphery are technically too small to characterize but statistically likely to be cysts. The patient has an ileal conduit extending out to the right abdomen. No hydronephrosis or hydroureter. Cystoprostatectomy without local recurrence of tumor in the lower pelvis. Stomach/Bowel: Prominent stool throughout the colon favors constipation. Sigmoid diverticulosis. Vascular/Lymphatic: Aortoiliac atherosclerotic vascular disease. Suspected severe stenosis of the proximal celiac trunk. New stent in the right external iliac artery. As best I can tell this appears patent. A peripancreatic node measures 10 mm in short axis on image 59/4. This is been borderline prominent at least since 11/15/2016, but not progressive. Reproductive: Cystoprostatectomy. Other: No supplemental non-categorized findings. Musculoskeletal: Chronic irregularity of the right anterior iliac crest. Right femoral IM nail. Old deformities of the pubic rami, healed. Compression bandaging along the left groin. Lumbar spondylosis and degenerative disc disease with impingement  at L2-3, L3-4, and L4-5. IMPRESSION: 1. No current findings of active malignancy. 2. Chronic mild nodularity in the lungs. Chronically stable borderline prominent peripancreatic lymph node. 3. There is a new right external iliac artery stent. Compression bandaging along the left groin. Aortic Atherosclerosis (ICD10-I70.0). As before there is prominent narrowing of the proximal celiac trunk. 4. Other imaging findings of potential clinical significance: Coronary atherosclerosis. Emphysema (ICD10-J43.9). Cholelithiasis. Prominent stool throughout the colon favors constipation. Sigmoid colon diverticulosis. Cystoprostatectomy. Multilevel lumbar impingement. Electronically Signed   By: Van Clines M.D.   On: 10/20/2018 17:35   Ct Abdomen Pelvis W Contrast  Result Date: 10/20/2018 CLINICAL DATA:  Restaging of bladder urothelial cancer. Prior cystoprostatectomy. Lower extremity stent placement yesterday EXAM: CT CHEST, ABDOMEN, AND PELVIS WITH CONTRAST TECHNIQUE: Multidetector CT imaging of the chest, abdomen and pelvis was performed following the standard protocol during bolus administration of intravenous contrast. CONTRAST:  56mL ISOVUE-300 IOPAMIDOL (ISOVUE-300) INJECTION 61% COMPARISON:  Multiple exams, including 04/20/2018 CT FINDINGS: CT CHEST FINDINGS Cardiovascular: Coronary, aortic arch, and branch vessel atherosclerotic vascular disease. Mediastinum/Nodes: Right eccentric subcarinal node 0.7 cm in short axis. No significant esophageal abnormality observed. Lungs/Pleura: Centrilobular emphysema. Stable appearance of the nodule along the minor fissure, 0.6 cm in diameter on image 88/3. Stable 4 mm right lower lobe nodule, image 86/3, partially obscured by atelectasis on the prior exam. Faint scattered subtle nodularity in the periphery of the lungs as on prior exams. Musculoskeletal: Unremarkable CT ABDOMEN PELVIS FINDINGS Hepatobiliary: 6 mm hepatic cysts in segment 4 of the liver, image 58/2.  2.0 cm  gallstone in the gallbladder. Mildly contracted gallbladder. No biliary dilatation. Pancreas: Unremarkable Spleen: Unremarkable Adrenals/Urinary Tract: Stable thickening of the left adrenal gland. Stable hypodense right renal lesions along the renal periphery are technically too small to characterize but statistically likely to be cysts. The patient has an ileal conduit extending out to the right abdomen. No hydronephrosis or hydroureter. Cystoprostatectomy without local recurrence of tumor in the lower pelvis. Stomach/Bowel: Prominent stool throughout the colon favors constipation. Sigmoid diverticulosis. Vascular/Lymphatic: Aortoiliac atherosclerotic vascular disease. Suspected severe stenosis of the proximal celiac trunk. New stent in the right external iliac artery. As best I can tell this appears patent. A peripancreatic node measures 10 mm in short axis on image 59/4. This is been borderline prominent at least since 11/15/2016, but not progressive. Reproductive: Cystoprostatectomy. Other: No supplemental non-categorized findings. Musculoskeletal: Chronic irregularity of the right anterior iliac crest. Right femoral IM nail. Old deformities of the pubic rami, healed. Compression bandaging along the left groin. Lumbar spondylosis and degenerative disc disease with impingement at L2-3, L3-4, and L4-5. IMPRESSION: 1. No current findings of active malignancy. 2. Chronic mild nodularity in the lungs. Chronically stable borderline prominent peripancreatic lymph node. 3. There is a new right external iliac artery stent. Compression bandaging along the left groin. Aortic Atherosclerosis (ICD10-I70.0). As before there is prominent narrowing of the proximal celiac trunk. 4. Other imaging findings of potential clinical significance: Coronary atherosclerosis. Emphysema (ICD10-J43.9). Cholelithiasis. Prominent stool throughout the colon favors constipation. Sigmoid colon diverticulosis. Cystoprostatectomy. Multilevel lumbar  impingement. Electronically Signed   By: Van Clines M.D.   On: 10/20/2018 17:35    Microbiology: Recent Results (from the past 240 hour(s))  Culture, blood (routine x 2)     Status: None (Preliminary result)   Collection Time: 11/12/18  5:43 PM  Result Value Ref Range Status   Specimen Description BLOOD BLOOD LEFT WRIST  Final   Special Requests   Final    BOTTLES DRAWN AEROBIC AND ANAEROBIC Blood Culture adequate volume   Culture   Final    NO GROWTH 2 DAYS Performed at [facility], [address] [place], [place] [address]    Report Status PENDING  Incomplete  Culture, blood (routine x 2)     Status: None (Preliminary result)   Collection Time: 11/12/18  5:47 PM  Result Value Ref Range Status   Specimen Description BLOOD LEFT ANTECUBITAL  Final   Special Requests   Final    BOTTLES DRAWN AEROBIC AND ANAEROBIC Blood Culture adequate volume   Culture   Final    NO GROWTH 2 DAYS Performed at [facility], [address] [place], [place] [address]    Report Status PENDING  Incomplete  Culture, Urine     Status: Abnormal   Collection Time: 11/13/18  6:55 AM  Result Value Ref Range Status   Specimen Description   Final    URINE, CLEAN CATCH Performed at [facility], [address] [place], [place] [address]    Special Requests   Final    NONE Performed at [facility], [address], [place], [place] [address]    Culture (A)  Final    >=100,000 COLONIES/mL MULTIPLE SPECIES PRESENT, SUGGEST RECOLLECTION   Report Status 11/14/2018 FINAL  Final     Labs: Basic Metabolic Panel: Recent Labs  Lab 11/12/18 1351 11/13/18 0630 11/14/18 0703  NA 139 139 139  K 4.4 4.7 4.5  CL 110 115* 115*  CO2 22 18* 17*  GLUCOSE 98  89 83  BUN 36* 28* 21  CREATININE 2.33* 1.84* 1.64*  CALCIUM 8.4* 8.2* 8.2*   Liver Function Tests: Recent Labs  Lab 11/12/18 1351 11/13/18 0630 11/14/18 0703  AST 19 14* 13*  ALT 11 10 9   ALKPHOS 86 79 75  BILITOT 0.2* 0.5 0.6  PROT 6.5  5.5* 5.3*  ALBUMIN 3.5 3.0* 2.9*   CBC: Recent Labs  Lab 11/12/18 1351 11/13/18 0630 11/14/18 0703  WBC 12.9* 7.8 6.6  HGB 7.2* 8.8* 8.4*  HCT 24.3* 28.2* 27.4*  MCV 97.2 94.6 96.1  PLT 320 193 176    Signed:  Barton Dubois MD.  [facility] 11/14/2018, 3:06 PM

## 2018-11-14 NOTE — Transfer of Care (Signed)
Immediate Anesthesia Transfer of Care Note  Patient: Jeffery Ponce  Procedure(s) Performed: COLONOSCOPY WITH PROPOFOL (N/A ) POLYPECTOMY  Patient Location: PACU  Anesthesia Type:MAC  Level of Consciousness: awake, alert  and oriented  Airway & Oxygen Therapy: Patient Spontanous Breathing  Post-op Assessment: Report given to RN, Post -op Vital signs reviewed and stable, Patient moving all extremities X 4 and Patient able to stick tongue midline  Post vital signs: Reviewed and stable  Last Vitals:  Vitals Value Taken Time  BP 136/68 11/14/2018 10:09 AM  Temp 36.9 C 11/14/2018 10:09 AM  Pulse 84 11/14/2018 10:09 AM  Resp 18 11/14/2018 10:09 AM  SpO2 94 % 11/14/2018 10:09 AM    Last Pain:  Vitals:   11/14/18 0841  TempSrc: Oral  PainSc: 0-No pain      Patients Stated Pain Goal: 3 (01/19/14 5208)  Complications: No apparent anesthesia complications

## 2018-11-14 NOTE — Anesthesia Postprocedure Evaluation (Signed)
Anesthesia Post Note  Patient: Jeffery Ponce  Procedure(s) Performed: COLONOSCOPY WITH PROPOFOL (N/A ) POLYPECTOMY  Patient location during evaluation: PACU Anesthesia Type: MAC Level of consciousness: awake, awake and alert and oriented Pain management: satisfactory to patient Vital Signs Assessment: post-procedure vital signs reviewed and stable Respiratory status: spontaneous breathing, nonlabored ventilation and respiratory function stable Cardiovascular status: blood pressure returned to baseline and stable Postop Assessment: no headache and no backache Anesthetic complications: no     Last Vitals:  Vitals:   11/14/18 0841 11/14/18 1009  BP: 138/60 136/68  Pulse: 81 84  Resp: 18 18  Temp: 36.5 C 36.9 C  SpO2: 96% 94%    Last Pain:  Vitals:   11/14/18 0841  TempSrc: Oral  PainSc: 0-No pain                 Darnell Level Quron Ruddy

## 2018-11-14 NOTE — Interval H&P Note (Signed)
History and Physical Interval Note:  11/14/2018 8:59 AM  Jeffery Ponce  has presented today for surgery, with the diagnosis of melena  The various methods of treatment have been discussed with the patient and family. After consideration of risks, benefits and other options for treatment, the patient has consented to  Procedure(s): COLONOSCOPY WITH PROPOFOL (N/A) as a surgical intervention .  The patient's history has been reviewed, patient examined, no change in status, stable for surgery.  I have reviewed the patient's chart and labs.  Questions were answered to the patient's satisfaction.     [employer]

## 2018-11-14 NOTE — Anesthesia Preprocedure Evaluation (Signed)
Anesthesia Evaluation    Airway Mallampati: II       Dental  (+) Upper Dentures, Partial Lower   Pulmonary Current Smoker,     + decreased breath sounds      Cardiovascular + Peripheral Vascular Disease  + Valvular Problems/Murmurs  Rhythm:regular     Neuro/Psych    GI/Hepatic GERD  ,  Endo/Other    Renal/GU CRF     Musculoskeletal   Abdominal   Peds  Hematology  (+) Blood dyscrasia, anemia ,   Anesthesia Other Findings EGD 12/6 without anesthetic issues for GI Bleed 2 units PRBC 12/6 States continues to pass blood rectally AM Hgb 8.4, 8.8 yesterday States NPO today and succesfull prep Denies interval change since anesthetic yesterday  Reproductive/Obstetrics                             Anesthesia Physical Anesthesia Plan  ASA: IV  Anesthesia Plan: MAC   Post-op Pain Management:    Induction:   PONV Risk Score and Plan:   Airway Management Planned:   Additional Equipment:   Intra-op Plan:   Post-operative Plan:   Informed Consent:   Plan Discussed with: Anesthesiologist  Anesthesia Plan Comments:         Anesthesia Quick Evaluation

## 2018-11-14 NOTE — Op Note (Addendum)
[facility] Patient Name: Jeffery Ponce Procedure Date: 11/14/2018 9:04 AM MRN: [id number] Date of Birth: 08-May-1946 Attending MD: Barney Drain MD, MD CSN: [id number] Age: 72 Admit Type: Outpatient Procedure:                Colonoscopy WITH COLD SNARE POLYPECTOMY/APC Indications:              Hematochezia Providers:                Barney Drain MD, MD, Janeece Riggers, RN, Nelma Rothman,                            Technician Referring MD:             Renee Rival Medicines:                Propofol per Anesthesia Complications:            No immediate complications. Estimated Blood Loss:     Estimated blood loss was minimal. Procedure:                Pre-Anesthesia Assessment:                           - Prior to the procedure, a History and Physical                            was performed, and patient medications and                            allergies were reviewed. The patient's tolerance of                            previous anesthesia was also reviewed. The risks                            and benefits of the procedure and the sedation                            options and risks were discussed with the patient.                            All questions were answered, and informed consent                            was obtained. Prior Anticoagulants: The patient                            last took aspirin 3 days and Plavix (clopidogrel) 3                            days prior to the procedure. ASA Grade Assessment:                            II - A patient with mild systemic disease. After  reviewing the risks and benefits, the patient was                            deemed in satisfactory condition to undergo the                            procedure. After obtaining informed consent, the                            colonoscope was passed under direct vision.                            Throughout the procedure, the patient's blood                             pressure, pulse, and oxygen saturations were                            monitored continuously. The CF-HQ190L ([id number])                            scope was introduced through the anus and advanced                            to the 10 cm into the ileum. The colonoscopy was                            technically difficult and complex due to restricted                            mobility of the colon and a redundant colon.                            Successful completion of the procedure was aided by                            increasing the dose of sedation medication,                            changing the patient to a supine position, using                            manual pressure, withdrawing the scope and                            replacing with the pediatric colonoscope and                            COLOWRAP. The patient tolerated the procedure                            fairly well. The quality of the bowel preparation  was good. The terminal ileum, ileocecal valve,                            appendiceal orifice, and rectum were photographed. Scope In: 9:23:04 AM Scope Out: 9:57:55 AM Scope Withdrawal Time: 0 hours 26 minutes 3 seconds  Total Procedure Duration: 0 hours 34 minutes 51 seconds  Findings:      The terminal ileum appeared normal.      A single small angioectasia without bleeding was found in the proximal       ascending colon. Coagulation for bleeding prevention using argon plasma       at 0.8 liters/minute and 20 watts was successful.      Multiple small and large-mouthed diverticula were found in the MODERATE       TO SEVERE IN THE recto-sigmoid colon, sigmoid colon, descending colon       and MILD IN THE distal ascending colon.      Six sessile polyps were found in the rectum(5: BTL 2) and distal       ascending colon. The polyps were 4 to 7 mm in size. These polyps were       removed with a cold snare. Resection and retrieval were  complete.      Internal hemorrhoids were found. The hemorrhoids were small.      The recto-sigmoid colon and sigmoid colon were grossly tortuous. Impression:               - RECTAL BLEEDING MOST LIKELY DUE TO DIVERTICULAR                            BLEED, LESS LIKELY R COLON AVM OR SMALL BOWEL                            SOURCE.                           - A single non-bleeding colonic angioectasia.                            Treated with argon plasma coagulation (APC).                           - Diverticulosis in the recto-sigmoid colon, in the                            sigmoid colon, in the descending colon and in the                            distal ascending colon.                           - Six 4 to 7 mm polyps in the rectum and in the                            distal ascending colon, removed with a cold snare.  Resected and retrieved.                           - Internal hemorrhoids.                           - Tortuous colon. Moderate Sedation:      Per Anesthesia Care Recommendation:           - High fiber/HEART HEALTHY diet.                           - Continue present medications. RESTART ASA/PLAVIX                            ON DISCHARGE.                           - Await pathology results. CONSIDER GIVENS CAPSULE                            IF CONTINUES TO HAVE RECTAL BLEEDING.                           - Repeat colonoscopy is not recommended due to                            current age (56 years or older) for surveillance.                           - Return patient to hospital ward for ongoing care. Procedure Code(s):        --- Professional ---                           [phone], 59, Colonoscopy, flexible; with control of                            bleeding, any method                           45385, Colonoscopy, flexible; with removal of                            tumor(s), polyp(s), or other lesion(s) by snare                             technique Diagnosis Code(s):        --- Professional ---                           Q22.2, Other hemorrhoids                           K55.20, Angiodysplasia of colon without hemorrhage                           K62.1, Rectal polyp  D12.2, Benign neoplasm of ascending colon                           K92.1, Melena (includes Hematochezia)                           K57.30, Diverticulosis of large intestine without                            perforation or abscess without bleeding                           Q43.8, Other specified congenital malformations of                            intestine CPT copyright 2018 [employer]. All rights reserved. The codes documented in this report are preliminary and upon coder review may  be revised to meet current compliance requirements. Barney Drain, MD Barney Drain MD, MD 11/14/2018 10:39:15 AM This report has been signed electronically. Number of Addenda: 0

## 2018-11-14 NOTE — Progress Notes (Signed)
Patient has had two enemas during night shift.  Another due at 0700.  Patient did not tolerate enemas well and did not have good output after enemas. Patient is not clear, has different color solids coming mixed in with stool.   Patient stated he did not want another enema.

## 2018-11-14 NOTE — Progress Notes (Signed)
Patient's IV catheter removed and intact. Pt's IV site clean dry and intact. Discharge instructions including medications and follow up appointments were reviewed and discussed with patient and spouse [name]. All questions were answered and no further questions at this time. Pt in stable condition and in no acute distress at time of discharge. Pt escorted by RN.

## 2018-11-15 LAB — TYPE AND SCREEN
ABO/RH(D): A POS
Antibody Screen: NEGATIVE
Unit division: 0
Unit division: 0
Unit division: 0
Unit division: 0
Unit division: 0

## 2018-11-15 LAB — BPAM RBC
Blood Product Expiration Date: 201912312359
Blood Product Expiration Date: 201912312359
Blood Product Expiration Date: 201912312359
Blood Product Expiration Date: 202001102359
Blood Product Expiration Date: 202001102359
ISSUE DATE / TIME: 201912051828
ISSUE DATE / TIME: 201912052117
Unit Type and Rh: 6200
Unit Type and Rh: 6200
Unit Type and Rh: 6200
Unit Type and Rh: 6200
Unit Type and Rh: 6200

## 2018-11-16 ENCOUNTER — Telehealth: Payer: Self-pay | Admitting: Gastroenterology

## 2018-11-16 ENCOUNTER — Encounter (HOSPITAL_COMMUNITY): Payer: Self-pay | Admitting: Gastroenterology

## 2018-11-17 ENCOUNTER — Other Ambulatory Visit (INDEPENDENT_AMBULATORY_CARE_PROVIDER_SITE_OTHER): Payer: Self-pay | Admitting: Vascular Surgery

## 2018-11-17 ENCOUNTER — Telehealth (INDEPENDENT_AMBULATORY_CARE_PROVIDER_SITE_OTHER): Payer: Self-pay

## 2018-11-17 DIAGNOSIS — Z95820 Peripheral vascular angioplasty status with implants and grafts: Secondary | ICD-10-CM

## 2018-11-17 DIAGNOSIS — D62 Acute posthemorrhagic anemia: Secondary | ICD-10-CM

## 2018-11-17 DIAGNOSIS — I70213 Atherosclerosis of native arteries of extremities with intermittent claudication, bilateral legs: Secondary | ICD-10-CM

## 2018-11-17 LAB — CULTURE, BLOOD (ROUTINE X 2)
Culture: NO GROWTH
Special Requests: ADEQUATE

## 2018-11-17 NOTE — Telephone Encounter (Signed)
Patient called yesterday and left a message for a return call. I called the patient back and he is concerned that he has bruising and bleeding. Spoke with Dr. Lucky Cowboy and relayed the recommendations from Dr. Lucky Cowboy to the patient that because of the stent placement in his leg from his angio on 10/26/18 he needs to continue taking his Plavix and Aspirin as directed to keep the stent open. Per Dr. Lucky Cowboy he may be able to come off the Plavix in 90 days.

## 2018-11-17 NOTE — Addendum Note (Signed)
Addendum  created 11/17/18 0349 by Vista Deck, CRNA   Charge Capture section accepted

## 2018-11-17 NOTE — Addendum Note (Signed)
Addendum  created 11/17/18 0844 by Vista Deck, CRNA   Intraprocedure Staff edited

## 2018-11-18 LAB — CULTURE, BLOOD (ROUTINE X 2): Special Requests: ADEQUATE

## 2018-11-18 LAB — BLOOD CULTURE ID PANEL (REFLEXED)

## 2018-11-18 NOTE — Telephone Encounter (Signed)
Please call pt. His stomach Bx shows mild gastritis. HE HAD ONE SIMPLE ADENOMAS AND FIVE HYPERPLASTIC POLYPS REMOVED.   IT IS OK TO RE-START ASA/PLAVIX. PLEASE CALL DR. Gala Romney WITH QUESTIONS OR CONCERNS. DRINK WATER TO KEEP YOUR URINE LIGHT YELLOW. FOLLOW A HIGH FIBER DIET. AVOID ITEMS THAT CAUSE BLOATING & GAS.  We do not routinely screen for polyps after the age of 90

## 2018-11-18 NOTE — Telephone Encounter (Signed)
Pt notified of results

## 2018-11-27 ENCOUNTER — Ambulatory Visit (INDEPENDENT_AMBULATORY_CARE_PROVIDER_SITE_OTHER): Payer: Medicare HMO | Admitting: Vascular Surgery

## 2018-11-27 ENCOUNTER — Ambulatory Visit (INDEPENDENT_AMBULATORY_CARE_PROVIDER_SITE_OTHER): Payer: Medicare HMO

## 2018-11-27 ENCOUNTER — Encounter (INDEPENDENT_AMBULATORY_CARE_PROVIDER_SITE_OTHER): Payer: Self-pay | Admitting: Vascular Surgery

## 2018-11-27 VITALS — BP 123/66 | HR 85 | Resp 18 | Ht 70.0 in | Wt 161.4 lb

## 2018-11-27 DIAGNOSIS — D62 Acute posthemorrhagic anemia: Secondary | ICD-10-CM

## 2018-11-27 DIAGNOSIS — Z95820 Peripheral vascular angioplasty status with implants and grafts: Secondary | ICD-10-CM | POA: Diagnosis not present

## 2018-11-27 DIAGNOSIS — I70213 Atherosclerosis of native arteries of extremities with intermittent claudication, bilateral legs: Secondary | ICD-10-CM

## 2018-11-27 DIAGNOSIS — N183 Chronic kidney disease, stage 3 unspecified: Secondary | ICD-10-CM

## 2018-11-27 DIAGNOSIS — I70219 Atherosclerosis of native arteries of extremities with intermittent claudication, unspecified extremity: Secondary | ICD-10-CM | POA: Insufficient documentation

## 2018-11-27 NOTE — Assessment & Plan Note (Signed)
His ABIs today are 0.94 on the right and 0.72 on the left with biphasic waveforms. Symptoms are markedly improved after revascularization a little over a month ago.  Still some disease on the left, but currently no lifestyle limiting symptoms.  We will continue current medical regimen of aspirin, Plavix, and a statin agent for now.  Will return in about 3 months with follow-ups with noninvasive studies.  At that time, if his studies are stable we may consider discontinuation of the Plavix.

## 2018-11-27 NOTE — Assessment & Plan Note (Signed)
With GI bleed.  Improved

## 2018-11-27 NOTE — Assessment & Plan Note (Signed)
Limit contrast with any procedures.

## 2018-11-27 NOTE — Patient Instructions (Signed)
Peripheral Vascular Disease  Peripheral vascular disease (PVD) is a disease of the blood vessels that are not part of your heart and brain. A simple term for PVD is poor circulation. In most cases, PVD narrows the blood vessels that carry blood from your heart to the rest of your body. This can reduce the supply of blood to your arms, legs, and internal organs, like your stomach or kidneys. However, PVD most often affects a person's lower legs and feet. Without treatment, PVD tends to get worse. PVD can also lead to acute ischemic limb. This is when an arm or leg suddenly cannot get enough blood. This is a medical emergency. Follow these instructions at home: Lifestyle  Do not use any products that contain nicotine or tobacco, such as cigarettes and e-cigarettes. If you need help quitting, ask your doctor.  Lose weight if you are overweight. Or, stay at a healthy weight as told by your doctor.  Eat a diet that is low in fat and cholesterol. If you need help, ask your doctor.  Exercise regularly. Ask your doctor for activities that are right for you. General instructions  Take over-the-counter and prescription medicines only as told by your doctor.  Take good care of your feet: ? Wear comfortable shoes that fit well. ? Check your feet often for any cuts or sores.  Keep all follow-up visits as told by your doctor This is important. Contact a doctor if:  You have cramps in your legs when you walk.  You have leg pain when you are at rest.  You have coldness in a leg or foot.  Your skin changes.  You are unable to get or have an erection (erectile dysfunction).  You have cuts or sores on your feet that do not heal. Get help right away if:  Your arm or leg turns cold, numb, and blue.  Your arms or legs become red, warm, swollen, painful, or numb.  You have chest pain.  You have trouble breathing.  You suddenly have weakness in your face, arm, or leg.  You become very  confused or you cannot speak.  You suddenly have a very bad headache.  You suddenly cannot see. Summary  Peripheral vascular disease (PVD) is a disease of the blood vessels.  A simple term for PVD is poor circulation. Without treatment, PVD tends to get worse.  Treatment may include exercise, low fat and low cholesterol diet, and quitting smoking. This information is not intended to replace advice given to you by your health care provider. Make sure you discuss any questions you have with your health care provider. Document Released: 02/19/2010 Document Revised: 01/02/2017 Document Reviewed: 01/02/2017 [employer] Interactive Patient Education  2019 [employer]  

## 2018-11-27 NOTE — Progress Notes (Signed)
MRN : [id number]  Jeffery Ponce is a 72 y.o. (1946-01-24) male who presents with chief complaint of  Chief Complaint  Patient presents with  . Follow-up  .  History of Present Illness: Patient returns today in follow up of peripheral arterial disease.  He underwent extensive right lower extremity revascularization as well as a left iliac stent placement about 6 weeks ago.  He did have some upper GI bleeding which has been controlled and resolved a couple of weeks ago.  He is currently doing well.  He walked a mile and a half today without any restrictions.  He had no access related complications.  He had some mild postoperative swelling which has resolved.  His ABIs today are 0.94 on the right and 0.72 on the left with biphasic waveforms.  Current Outpatient Medications  Medication Sig Dispense Refill  . aspirin EC 81 MG tablet Take 1 tablet (81 mg total) by mouth daily. (Patient taking differently: Take 81 mg by mouth every morning. ) 150 tablet 2  . atorvastatin (LIPITOR) 10 MG tablet Take 1 tablet (10 mg total) by mouth daily. (Patient taking differently: Take 10 mg by mouth every morning. ) 30 tablet 11  . cholecalciferol (VITAMIN D) 25 MCG (1000 UT) tablet Take 1,000 Units by mouth every morning.   4  . clopidogrel (PLAVIX) 75 MG tablet Take 1 tablet (75 mg total) by mouth daily. (Patient taking differently: Take 75 mg by mouth every morning. ) 30 tablet 11  . furosemide (LASIX) 20 MG tablet Take 20 mg by mouth every morning.   5  . mirtazapine (REMERON) 15 MG tablet Take 15 mg by mouth at bedtime.   1  . morphine (MS CONTIN) 15 MG 12 hr tablet Take 15 mg by mouth every 12 (twelve) hours.   0  . Oxycodone HCl 10 MG TABS Take 1-2 tablets (10-20 mg total) by mouth every 4 (four) hours as needed (pain). Post-operatively (Patient taking differently: Take 10 mg by mouth every 3 (three) hours as needed (pain). Post-operatively) 40 tablet 0  . pantoprazole (PROTONIX) 40 MG tablet Take 1 tablet  (40 mg total) by mouth daily before breakfast. 30 tablet 1  . polyethylene glycol (MIRALAX) packet Take 17 g by mouth daily. 28 each 1  . sodium bicarbonate 650 MG tablet Take 650 mg by mouth 2 (two) times daily.     No current facility-administered medications for this visit.     Past Medical History:  Diagnosis Date  . Arthritis   . Chronic renal disease, stage 3, moderately decreased glomerular filtration rate (GFR) between 30-59 mL/min/1.73 square meter (HCC) 11/28/2017  . Heart murmur   . History of kidney stones   . Hypercholesteremia   . Urothelial carcinoma ([place]) 12/10/2016  . 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 BIOPSY  11/13/2018   Procedure: BIOPSY;  Surgeon: Danie Binder, MD;  [facility];  Service: Endoscopy;;  gastric bx's  . CARDIAC CATHETERIZATION    . COLONOSCOPY WITH PROPOFOL N/A 11/14/2018   Procedure: COLONOSCOPY WITH PROPOFOL;  Surgeon: Danie Binder, MD;  [facility];  Service: Endoscopy;  Laterality: N/A;  . CYSTOSCOPY WITH INJECTION N/A 07/30/2017   Procedure: CYSTOSCOPY WITH INJECTION OF INDOCYANINE GREEN DYE;  Surgeon: Alexis Frock, MD;  [facility];  Service: Urology;  Laterality: N/A;  . ESOPHAGOGASTRODUODENOSCOPY (EGD) WITH PROPOFOL N/A 11/13/2018   Procedure: ESOPHAGOGASTRODUODENOSCOPY (  EGD) WITH PROPOFOL;  Surgeon: Danie Binder, MD;  [facility];  Service: Endoscopy;  Laterality: N/A;  . FEMUR FRACTURE SURGERY Right   . LOWER EXTREMITY ANGIOGRAPHY Right 10/19/2018   Procedure: LOWER EXTREMITY ANGIOGRAPHY;  Surgeon: Algernon Huxley, MD;  Location: [facility];  Service: Cardiovascular;  Laterality: Right;  . MCT 3D RECONSTRUCTION ([facility] HX) Left    arm  . open reduction and internal fixation leg Right    hip and leg.  Marland Kitchen POLYPECTOMY  11/14/2018   Procedure: POLYPECTOMY;  Surgeon: Danie Binder, MD;  [facility];  Service:  Endoscopy;;  . PORT-A-CATH REMOVAL N/A 07/20/2018   Procedure: REMOVAL PORT-A-CATH;  Surgeon: Aviva Signs, MD;  Location: [facility];  Service: General;  Laterality: N/A;  . PORTACATH PLACEMENT N/A 12/23/2016   Procedure: PLACEMENT OF TUNNELED CENTRAL VENOUS CATHETER RIGHT INTERNAL JUGULAR WITH SUBCUTANEOUS PORT;  Surgeon: Vickie Epley, MD;  Location: [facility];  Service: Vascular;  Laterality: N/A;  . reattatchment of left arm     from [place]  . TRANSURETHRAL RESECTION OF BLADDER TUMOR N/A 11/19/2016   Procedure: TRANSURETHRAL RESECTION OF BLADDER TUMOR (TURBT) WITH EPIRUBICIN INJECTION;  Surgeon: Franchot Gallo, MD;  Location: [facility];  Service: Urology;  Laterality: N/A;    Social History Social History   Tobacco Use  . Smoking status: Former Smoker    Packs/day: 0.50    Years: 50.00    Pack years: 25.00    Types: Cigarettes    Last attempt to quit: 10/30/2018    Years since quitting: 0.0  . Smokeless tobacco: Never Used  Substance Use Topics  . Alcohol use: No    Frequency: Never  . Drug use: No    Family History Family History  Problem Relation Age of Onset  . Aneurysm Mother   . Diabetes Father   . Colon cancer Brother     Allergies  Allergen Reactions  . Penicillins Anaphylaxis    Has patient had a PCN reaction causing immediate rash, facial/tongue/throat swelling, SOB or lightheadedness with hypotension:Yes Has patient had a PCN reaction causing severe rash involving mucus membranes or skin necrosis:Yes Has patient had a PCN reaction that required hospitalization:No Has patient had a PCN reaction occurring within the last 10 years:No If all of the above answers are "NO", then may proceed with Cephalosporin use.   . Ciprofloxacin Itching    Itching at IV site. No hives or shortness of breathe.  . Ambien [Zolpidem Tartrate] Other (See Comments)    "Sleep walking"     REVIEW OF SYSTEMS (Negative unless checked)  Constitutional: [] Weight loss  [] Fever   [] Chills Cardiac: [] Chest pain   [] Chest pressure   [] Palpitations   [] Shortness of breath when laying flat   [] Shortness of breath at rest   [] Shortness of breath with exertion. Vascular:  [x] Pain in legs with walking   [] Pain in legs at rest   [] Pain in legs when laying flat   [x] Claudication   [] Pain in feet when walking  [] Pain in feet at rest  [] Pain in feet when laying flat   [] History of DVT   [] Phlebitis   [] Swelling in legs   [] Varicose veins   [] Non-healing ulcers Pulmonary:   [] Uses home oxygen   [] Productive cough   [] Hemoptysis   [] Wheeze  [] COPD   [] Asthma Neurologic:  [] Dizziness  [] Blackouts   [] Seizures   [] History of stroke   [] History of TIA  [] Aphasia   [] Temporary  blindness   [] Dysphagia   [] Weakness or numbness in arms   [] Weakness or numbness in legs Musculoskeletal:  [x] Arthritis   [] Joint swelling   [] Joint pain   [] Low back pain Hematologic:  [] Easy bruising  [] Easy bleeding   [] Hypercoagulable state   [x] Anemic   Gastrointestinal:  [x] Blood in stool   [] Vomiting blood  [x] Gastroesophageal reflux/heartburn   [] Abdominal pain Genitourinary:  [] Chronic kidney disease   [] Difficult urination  [] Frequent urination  [] Burning with urination   [] Hematuria Skin:  [] Rashes   [] Ulcers   [] Wounds Psychological:  [] History of anxiety   []  History of major depression.  Physical Examination  BP 123/66 (BP Location: Right Arm)   Pulse 85   Resp 18   Ht 5' 10"  (1.778 m)   Wt 161 lb 6.4 oz (73.2 kg)   BMI 23.16 kg/m  Gen:  WD/WN, NAD Head: [place]/AT, No temporalis wasting. Ear/Nose/Throat: Hearing grossly intact, nares w/o erythema or drainage Eyes: Conjunctiva clear. Sclera non-icteric Neck: Supple.  Trachea midline Pulmonary:  Good air movement, no use of accessory muscles.  Cardiac: RRR, no JVD Vascular:  Vessel Right Left  Radial Palpable Palpable                          PT Palpable 1+ Palpable  DP 1+ Palpable 1+ Palpable    Musculoskeletal: M/S 5/5 throughout.  No  deformity or atrophy. No edema. Neurologic: Sensation grossly intact in extremities.  Symmetrical.  Speech is fluent.  Psychiatric: Judgment intact, Mood & affect appropriate for pt's clinical situation. Dermatologic: No rashes or ulcers noted.  No cellulitis or open wounds.       Labs Recent Results (from the past 2160 hour(s))  BUN     Status: Abnormal   Collection Time: 10/16/18  1:12 PM  Result Value Ref Range   BUN 31 (H) 8 - 23 mg/dL    Comment: Performed at [facility], [address] [place], [place] [address]  Creatinine, serum     Status: Abnormal   Collection Time: 10/16/18  1:12 PM  Result Value Ref Range   Creatinine, Ser 1.96 (H) 0.61 - 1.24 mg/dL   GFR calc non Af Amer 32 (L) >60 mL/min   GFR calc Af Amer 37 (L) >60 mL/min    Comment: (NOTE) The eGFR has been calculated using the CKD EPI equation. This calculation has not been validated in all clinical situations. eGFR's persistently <60 mL/min signify possible Chronic Kidney Disease. Performed at [facility], [address] [place], [place] [address]   CBC with Differential/Platelet     Status: Abnormal   Collection Time: 10/20/18 12:00 PM  Result Value Ref Range   WBC 15.1 (H) 4.0 - 10.5 K/uL   RBC 4.65 4.22 - 5.81 MIL/uL   Hemoglobin 13.6 13.0 - 17.0 g/dL   HCT 44.1 39.0 - 52.0 %   MCV 94.8 80.0 - 100.0 fL   MCH 29.2 26.0 - 34.0 pg   MCHC 30.8 30.0 - 36.0 g/dL   RDW 13.2 11.5 - 15.5 %   Platelets 335 150 - 400 K/uL   nRBC 0.0 0.0 - 0.2 %   Neutrophils Relative % 71 %   Neutro Abs 10.8 (H) 1.7 - 7.7 K/uL   Lymphocytes Relative 18 %   Lymphs Abs 2.6 0.7 - 4.0 K/uL   Monocytes Relative 8 %   Monocytes Absolute 1.2 (H) 0.1 - 1.0 K/uL   Eosinophils Relative 2 %  Eosinophils Absolute 0.3 0.0 - 0.5 K/uL   Basophils Relative 0 %   Basophils Absolute 0.1 0.0 - 0.1 K/uL   Immature Granulocytes 1 %   Abs Immature Granulocytes 0.08 (H) 0.00 - 0.07 K/uL    Comment: Performed at  [facility], [address] [place], [place] [address]  Comprehensive metabolic panel     Status: Abnormal   Collection Time: 10/20/18 12:00 PM  Result Value Ref Range   Sodium 138 135 - 145 mmol/L   Potassium 4.3 3.5 - 5.1 mmol/L   Chloride 107 98 - 111 mmol/L   CO2 22 22 - 32 mmol/L   Glucose, Bld 108 (H) 70 - 99 mg/dL   BUN 27 (H) 8 - 23 mg/dL   Creatinine, Ser 1.91 (H) 0.61 - 1.24 mg/dL   Calcium 9.6 8.9 - 10.3 mg/dL   Total Protein 8.4 (H) 6.5 - 8.1 g/dL   Albumin 4.3 3.5 - 5.0 g/dL   AST 13 (L) 15 - 41 U/L   ALT 9 0 - 44 U/L   Alkaline Phosphatase 112 38 - 126 U/L   Total Bilirubin 0.5 0.3 - 1.2 mg/dL   GFR calc non Af Amer 33 (L) >60 mL/min   GFR calc Af Amer 38 (L) >60 mL/min    Comment: (NOTE) The eGFR has been calculated using the CKD EPI equation. This calculation has not been validated in all clinical situations. eGFR's persistently <60 mL/min signify possible Chronic Kidney Disease.    Anion gap 9 5 - 15    Comment: Performed at [facility], [address] [place], [place] [address]  Lactate dehydrogenase     Status: None   Collection Time: 10/20/18 12:00 PM  Result Value Ref Range   LDH 119 98 - 192 U/L    Comment: Performed at [facility], [address] [place], [place] [address]  ABO/Rh     Status: None   Collection Time: 11/12/18  1:35 PM  Result Value Ref Range   ABO/RH(D)      A POS Performed at [facility], [address], [place], [place] [address]   Comprehensive metabolic panel     Status: Abnormal   Collection Time: 11/12/18  1:51 PM  Result Value Ref Range   Sodium 139 135 - 145 mmol/L   Potassium 4.4 3.5 - 5.1 mmol/L   Chloride 110 98 - 111 mmol/L   CO2 22 22 - 32 mmol/L   Glucose, Bld 98 70 - 99 mg/dL   BUN 36 (H) 8 - 23 mg/dL   Creatinine, Ser 2.33 (H) 0.61 - 1.24 mg/dL   Calcium 8.4 (L) 8.9 - 10.3 mg/dL   Total Protein 6.5 6.5 - 8.1 g/dL   Albumin 3.5 3.5 - 5.0 g/dL   AST 19 15 - 41 U/L   ALT 11 0 - 44 U/L   Alkaline Phosphatase  86 38 - 126 U/L   Total Bilirubin 0.2 (L) 0.3 - 1.2 mg/dL   GFR calc non Af Amer 27 (L) >60 mL/min   GFR calc Af Amer 31 (L) >60 mL/min   Anion gap 7 5 - 15    Comment: Performed at [facility], [address], [place], [place] [address]  CBC     Status: Abnormal   Collection Time: 11/12/18  1:51 PM  Result Value Ref Range   WBC 12.9 (H) 4.0 - 10.5 K/uL   RBC 2.50 (L) 4.22 - 5.81 MIL/uL   Hemoglobin 7.2 (L) 13.0 -  17.0 g/dL   HCT 24.3 (L) 39.0 - 52.0 %   MCV 97.2 80.0 - 100.0 fL   MCH 28.8 26.0 - 34.0 pg   MCHC 29.6 (L) 30.0 - 36.0 g/dL   RDW 14.2 11.5 - 15.5 %   Platelets 320 150 - 400 K/uL   nRBC 0.0 0.0 - 0.2 %    Comment: Performed at [facility], [address] [place], [place] [address]  Type and screen [facility]     Status: None   Collection Time: 11/12/18  1:51 PM  Result Value Ref Range   ABO/RH(D) A POS    Antibody Screen NEG    Sample Expiration 11/15/2018    Unit Number [id number]    Blood Component Type RED CELLS,LR    Unit division 00    Status of Unit ISSUED,FINAL    Transfusion Status OK TO TRANSFUSE    Crossmatch Result Compatible    Unit Number [id number]    Blood Component Type RED CELLS,LR    Unit division 00    Status of Unit ISSUED,FINAL    Transfusion Status OK TO TRANSFUSE    Crossmatch Result Compatible    Unit Number [id number]    Blood Component Type RED CELLS,LR    Unit division 00    Status of Unit REL FROM [facility]    Transfusion Status OK TO TRANSFUSE    Crossmatch Result Compatible    Unit Number [id number]    Blood Component Type RED CELLS,LR    Unit division 00    Status of Unit REL FROM [facility]    Transfusion Status OK TO TRANSFUSE    Crossmatch Result Compatible    Unit Number [id number]    Blood Component Type RED CELLS,LR    Unit division 00    Status of Unit REL FROM [facility]    Transfusion Status OK TO TRANSFUSE    Crossmatch Result      Compatible Performed at [facility], [address]  [place], [place] [address]   BPAM RBC     Status: None   Collection Time: 11/12/18  1:51 PM  Result Value Ref Range   ISSUE DATE / TIME 748270786754    Blood Product Unit Number [id number]    PRODUCT CODE E0336V00    Unit Type and Rh 5883    Blood Product Expiration Date 254982641583    ISSUE DATE / TIME 094076808811    Blood Product Unit Number [id number]    PRODUCT CODE [id number]    Unit Type and Rh 1771    Blood Product Expiration Date 165790383338    Blood Product Unit Number [id number]    Unit Type and Rh 5997    Blood Product Expiration Date 741423953202    Blood Product Unit Number [id number]    Unit Type and Rh 2902    Blood Product Expiration Date 111552080223    Blood Product Unit Number [id number]    Unit Type and Rh 6200    Blood Product Expiration Date 511021117356   Cortisol     Status: None   Collection Time: 11/12/18  1:51 PM  Result Value Ref Range   Cortisol, Plasma 10.2 ug/dL    Comment: (NOTE) AM    6.7 - 22.6 ug/dL PM   <10.0       ug/dL Performed at [facility] [address] [place], [place] [address]   TSH     Status: None   Collection Time: 11/12/18  1:51 PM  Result  Value Ref Range   TSH 2.790 0.350 - 4.500 uIU/mL    Comment: Performed by a 3rd Generation assay with a functional sensitivity of <=0.01 uIU/mL. Performed at [facility], [address], [place], [place] [address]   POC occult blood, ED     Status: Abnormal   Collection Time: 11/12/18  1:58 PM  Result Value Ref Range   Fecal Occult Bld POSITIVE (A) NEGATIVE  Prepare RBC     Status: None   Collection Time: 11/12/18  2:57 PM  Result Value Ref Range   Order Confirmation      ORDER PROCESSED BY BLOOD BANK Performed at [facility], [address], [place], [place] [address]   Lactic acid, plasma     Status: None   Collection Time: 11/12/18  5:43 PM  Result Value Ref Range   Lactic Acid, Venous 1.6 0.5 - 1.9 mmol/L    Comment: Performed at [facility], [address] [place], [place] [address]  Culture, blood (routine x 2)     Status: Abnormal   Collection Time: 11/12/18  5:43 PM  Result Value Ref Range   Specimen Description      BLOOD BLOOD LEFT WRIST Performed at [facility], [address], [place], [place] [address]    Special Requests      BOTTLES DRAWN AEROBIC AND ANAEROBIC Blood Culture adequate volume Performed at [facility], [address] [place], [place] [address]    Culture  Setup Time      Performed at [facility] Gram Stain Report Called to,Read Back By and Verified With: DISHMON,M AT 4401 ON 12.10.2019 BY ISLEY,B CRITICAL RESULT CALLED TO, READ BACK BY AND VERIFIED WITH: A [facility] RN 2358 11/17/18 A BROWNING Performed at [facility], [place] [address] [place], [place] [address]    Culture (A)     STAPHYLOCOCCUS SPECIES (COAGULASE NEGATIVE) CORRECTED ON 12/11 AT 1208: PREVIOUSLY REPORTED AS GRAM POSITIVE COCCI   Report Status 11/17/2018 FINAL   Blood Culture ID Panel (Reflexed)     Status: Abnormal   Collection Time: 11/12/18  5:43 PM  Result Value Ref Range   Enterococcus species NOT DETECTED NOT DETECTED   Listeria monocytogenes NOT DETECTED NOT DETECTED   Staphylococcus species DETECTED (A) NOT DETECTED    Comment: Methicillin (oxacillin) susceptible coagulase negative staphylococcus. Possible blood culture contaminant (unless isolated from more than one blood culture draw or clinical case suggests pathogenicity). No antibiotic treatment is indicated for blood  culture contaminants. CRITICAL RESULT CALLED TO, READ BACK BY AND VERIFIED WITH: A MCKINNEY RN 2358 11/17/18 A BROWNING    Staphylococcus aureus (BCID) NOT DETECTED NOT DETECTED   Methicillin resistance NOT DETECTED NOT DETECTED   Streptococcus species NOT DETECTED NOT DETECTED   Streptococcus agalactiae NOT DETECTED NOT DETECTED   Streptococcus pneumoniae NOT DETECTED NOT DETECTED   Streptococcus pyogenes NOT DETECTED NOT  DETECTED   Acinetobacter baumannii NOT DETECTED NOT DETECTED   Enterobacteriaceae species NOT DETECTED NOT DETECTED   Enterobacter cloacae complex NOT DETECTED NOT DETECTED   Escherichia coli NOT DETECTED NOT DETECTED   Klebsiella oxytoca NOT DETECTED NOT DETECTED   Klebsiella pneumoniae NOT DETECTED NOT DETECTED   Proteus species NOT DETECTED NOT DETECTED   Serratia marcescens NOT DETECTED NOT DETECTED   Haemophilus influenzae NOT DETECTED NOT DETECTED   Neisseria meningitidis NOT DETECTED NOT DETECTED   Pseudomonas aeruginosa NOT DETECTED NOT DETECTED   Candida albicans NOT DETECTED NOT DETECTED  Candida glabrata NOT DETECTED NOT DETECTED   Candida krusei NOT DETECTED NOT DETECTED   Candida parapsilosis NOT DETECTED NOT DETECTED   Candida tropicalis NOT DETECTED NOT DETECTED    Comment: Performed at [facility], [address], [place], [place] [address]  Culture, blood (routine x 2)     Status: None   Collection Time: 11/12/18  5:47 PM  Result Value Ref Range   Specimen Description BLOOD LEFT ANTECUBITAL    Special Requests      BOTTLES DRAWN AEROBIC AND ANAEROBIC Blood Culture adequate volume   Culture      NO GROWTH 5 DAYS Performed at [facility], [address] [place], [place] [address]    Report Status 11/17/2018 FINAL   Comprehensive metabolic panel     Status: Abnormal   Collection Time: 11/13/18  6:30 AM  Result Value Ref Range   Sodium 139 135 - 145 mmol/L   Potassium 4.7 3.5 - 5.1 mmol/L   Chloride 115 (H) 98 - 111 mmol/L   CO2 18 (L) 22 - 32 mmol/L   Glucose, Bld 89 70 - 99 mg/dL   BUN 28 (H) 8 - 23 mg/dL   Creatinine, Ser 1.84 (H) 0.61 - 1.24 mg/dL   Calcium 8.2 (L) 8.9 - 10.3 mg/dL   Total Protein 5.5 (L) 6.5 - 8.1 g/dL   Albumin 3.0 (L) 3.5 - 5.0 g/dL   AST 14 (L) 15 - 41 U/L   ALT 10 0 - 44 U/L   Alkaline Phosphatase 79 38 - 126 U/L   Total Bilirubin 0.5 0.3 - 1.2 mg/dL   GFR calc non Af Amer 36 (L) >60 mL/min   GFR calc Af Amer 41 (L) >60  mL/min   Anion gap 6 5 - 15    Comment: Performed at [facility], [address] [place], [place] [address]  CBC     Status: Abnormal   Collection Time: 11/13/18  6:30 AM  Result Value Ref Range   WBC 7.8 4.0 - 10.5 K/uL   RBC 2.98 (L) 4.22 - 5.81 MIL/uL   Hemoglobin 8.8 (L) 13.0 - 17.0 g/dL   HCT 28.2 (L) 39.0 - 52.0 %   MCV 94.6 80.0 - 100.0 fL   MCH 29.5 26.0 - 34.0 pg   MCHC 31.2 30.0 - 36.0 g/dL   RDW 14.7 11.5 - 15.5 %   Platelets 193 150 - 400 K/uL   nRBC 0.0 0.0 - 0.2 %    Comment: Performed at [facility], [address], [place], [place] [address]  Lactic acid, plasma     Status: None   Collection Time: 11/13/18  6:30 AM  Result Value Ref Range   Lactic Acid, Venous 0.5 0.5 - 1.9 mmol/L    Comment: Performed at [facility], [address], [place], [place] [address]  Culture, Urine     Status: Abnormal   Collection Time: 11/13/18  6:55 AM  Result Value Ref Range   Specimen Description      URINE, CLEAN CATCH Performed at [facility], [address], [place], [place] [address]    Special Requests      NONE Performed at [facility], [address] [place],  [address]    Culture (A)     >=100,000 COLONIES/mL MULTIPLE SPECIES PRESENT, SUGGEST RECOLLECTION   Report Status 11/14/2018 FINAL   Comprehensive metabolic panel     Status: Abnormal   Collection Time: 11/14/18  7:03 AM  Result Value  Ref Range   Sodium 139 135 - 145 mmol/L   Potassium 4.5 3.5 - 5.1 mmol/L   Chloride 115 (H) 98 - 111 mmol/L   CO2 17 (L) 22 - 32 mmol/L   Glucose, Bld 83 70 - 99 mg/dL   BUN 21 8 - 23 mg/dL   Creatinine, Ser 1.64 (H) 0.61 - 1.24 mg/dL   Calcium 8.2 (L) 8.9 - 10.3 mg/dL   Total Protein 5.3 (L) 6.5 - 8.1 g/dL   Albumin 2.9 (L) 3.5 - 5.0 g/dL   AST 13 (L) 15 - 41 U/L   ALT 9 0 - 44 U/L   Alkaline Phosphatase 75 38 - 126 U/L   Total Bilirubin 0.6 0.3 - 1.2 mg/dL   GFR calc non Af Amer 41 (L) >60 mL/min   GFR calc Af Amer 47 (L) >60 mL/min   Anion gap 7 5 - 15     Comment: Performed at [facility], [address] [place], [place] [address]  CBC     Status: Abnormal   Collection Time: 11/14/18  7:03 AM  Result Value Ref Range   WBC 6.6 4.0 - 10.5 K/uL   RBC 2.85 (L) 4.22 - 5.81 MIL/uL   Hemoglobin 8.4 (L) 13.0 - 17.0 g/dL   HCT 27.4 (L) 39.0 - 52.0 %   MCV 96.1 80.0 - 100.0 fL   MCH 29.5 26.0 - 34.0 pg   MCHC 30.7 30.0 - 36.0 g/dL   RDW 14.3 11.5 - 15.5 %   Platelets 176 150 - 400 K/uL   nRBC 0.0 0.0 - 0.2 %    Comment: Performed at [facility], [address] [place], [place] [address]  Prepare RBC     Status: None   Collection Time: 11/14/18 10:28 AM  Result Value Ref Range   Order Confirmation      ORDER PROCESSED BY BLOOD BANK Performed at [facility], [address] [place],  [address]     Radiology No results found.  Assessment/Plan  Acute blood loss anemia With GI bleed.  Improved  Chronic renal disease, stage 3, moderately decreased glomerular filtration rate (GFR) between 30-59 mL/min/1.73 square meter (HCC) Limit contrast with any procedures.  Atherosclerosis of native arteries of extremity with intermittent claudication (HCC) His ABIs today are 0.94 on the right and 0.72 on the left with biphasic waveforms. Symptoms are markedly improved after revascularization a little over a month ago.  Still some disease on the left, but currently no lifestyle limiting symptoms.  We will continue current medical regimen of aspirin, Plavix, and a statin agent for now.  Will return in about 3 months with follow-ups with noninvasive studies.  At that time, if his studies are stable we may consider discontinuation of the Plavix.    Leotis Pain, MD  11/27/2018 4:29 PM    This note was created with Dragon medical transcription system.  Any errors from dictation are purely unintentional

## 2019-01-13 ENCOUNTER — Encounter (HOSPITAL_COMMUNITY): Payer: Self-pay | Admitting: *Deleted

## 2019-01-13 ENCOUNTER — Telehealth (HOSPITAL_COMMUNITY): Payer: Self-pay | Admitting: *Deleted

## 2019-02-23 ENCOUNTER — Other Ambulatory Visit (INDEPENDENT_AMBULATORY_CARE_PROVIDER_SITE_OTHER): Payer: Self-pay | Admitting: Vascular Surgery

## 2019-02-23 ENCOUNTER — Encounter (INDEPENDENT_AMBULATORY_CARE_PROVIDER_SITE_OTHER): Payer: Medicare HMO

## 2019-02-23 ENCOUNTER — Ambulatory Visit (INDEPENDENT_AMBULATORY_CARE_PROVIDER_SITE_OTHER): Payer: Medicare HMO | Admitting: Vascular Surgery

## 2019-02-23 DIAGNOSIS — I70213 Atherosclerosis of native arteries of extremities with intermittent claudication, bilateral legs: Secondary | ICD-10-CM

## 2019-02-23 DIAGNOSIS — I739 Peripheral vascular disease, unspecified: Secondary | ICD-10-CM

## 2019-02-26 ENCOUNTER — Encounter (INDEPENDENT_AMBULATORY_CARE_PROVIDER_SITE_OTHER): Payer: Medicare HMO

## 2019-02-26 ENCOUNTER — Ambulatory Visit (INDEPENDENT_AMBULATORY_CARE_PROVIDER_SITE_OTHER): Payer: Medicare HMO | Admitting: Nurse Practitioner

## 2019-04-26 ENCOUNTER — Inpatient Hospital Stay (HOSPITAL_COMMUNITY): Payer: Medicare HMO | Attending: Hematology

## 2019-04-26 ENCOUNTER — Other Ambulatory Visit: Payer: Self-pay

## 2019-04-26 ENCOUNTER — Ambulatory Visit (HOSPITAL_COMMUNITY)
Admission: RE | Admit: 2019-04-26 | Discharge: 2019-04-26 | Disposition: A | Discharge status: Home / Self Care | Payer: Medicare HMO | Source: Ambulatory Visit | Attending: Hematology | Admitting: Hematology

## 2019-04-26 DIAGNOSIS — C679 Malignant neoplasm of bladder, unspecified: Secondary | ICD-10-CM | POA: Diagnosis present

## 2019-04-26 LAB — CBC WITH DIFFERENTIAL/PLATELET
Abs Immature Granulocytes: 0.12 10*3/uL — ABNORMAL HIGH (ref 0.00–0.07)
Basophils Absolute: 0.1 10*3/uL (ref 0.0–0.1)
Basophils Relative: 0 %
Eosinophils Absolute: 0.2 10*3/uL (ref 0.0–0.5)
Eosinophils Relative: 1 %
HCT: 37.5 % — ABNORMAL LOW (ref 39.0–52.0)
Hemoglobin: 11.1 g/dL — ABNORMAL LOW (ref 13.0–17.0)
Immature Granulocytes: 1 %
Lymphocytes Relative: 9 %
Lymphs Abs: 1.7 10*3/uL (ref 0.7–4.0)
MCH: 26.7 pg (ref 26.0–34.0)
MCHC: 29.6 g/dL — ABNORMAL LOW (ref 30.0–36.0)
MCV: 90.1 fL (ref 80.0–100.0)
Monocytes Absolute: 1.5 10*3/uL — ABNORMAL HIGH (ref 0.1–1.0)
Monocytes Relative: 8 %
Neutro Abs: 14.9 10*3/uL — ABNORMAL HIGH (ref 1.7–7.7)
Neutrophils Relative %: 81 %
Platelets: 326 10*3/uL (ref 150–400)
RBC: 4.16 MIL/uL — ABNORMAL LOW (ref 4.22–5.81)
RDW: 14.1 % (ref 11.5–15.5)
WBC: 18.4 10*3/uL — ABNORMAL HIGH (ref 4.0–10.5)
nRBC: 0 % (ref 0.0–0.2)

## 2019-04-26 LAB — COMPREHENSIVE METABOLIC PANEL
ALT: 9 U/L (ref 0–44)
AST: 11 U/L — ABNORMAL LOW (ref 15–41)
Albumin: 3.5 g/dL (ref 3.5–5.0)
Alkaline Phosphatase: 107 U/L (ref 38–126)
Anion gap: 10 (ref 5–15)
BUN: 30 mg/dL — ABNORMAL HIGH (ref 8–23)
CO2: 19 mmol/L — ABNORMAL LOW (ref 22–32)
Calcium: 8.8 mg/dL — ABNORMAL LOW (ref 8.9–10.3)
Chloride: 110 mmol/L (ref 98–111)
Creatinine, Ser: 1.81 mg/dL — ABNORMAL HIGH (ref 0.61–1.24)
GFR calc Af Amer: 42 mL/min — ABNORMAL LOW (ref >60)
GFR calc non Af Amer: 36 mL/min — ABNORMAL LOW (ref >60)
Glucose, Bld: 115 mg/dL — ABNORMAL HIGH (ref 70–99)
Potassium: 4 mmol/L (ref 3.5–5.1)
Sodium: 139 mmol/L (ref 135–145)
Total Bilirubin: 0.7 mg/dL (ref 0.3–1.2)
Total Protein: 7.3 g/dL (ref 6.5–8.1)

## 2019-04-26 MED ORDER — IOHEXOL 300 MG/ML  SOLN
100.0000 mL | Freq: Once | INTRAMUSCULAR | Status: AC | PRN
  Administered 2019-04-26: 15:00:00 100 mL via INTRAVENOUS

## 2019-04-29 ENCOUNTER — Encounter (HOSPITAL_COMMUNITY): Payer: Self-pay | Admitting: Hematology

## 2019-04-29 ENCOUNTER — Inpatient Hospital Stay (HOSPITAL_BASED_OUTPATIENT_CLINIC_OR_DEPARTMENT_OTHER): Payer: Medicare HMO | Admitting: Hematology

## 2019-04-29 ENCOUNTER — Other Ambulatory Visit: Payer: Self-pay

## 2019-04-29 VITALS — BP 140/69 | HR 52 | Temp 98.4°F | Resp 18 | Wt 155.8 lb

## 2019-04-29 DIAGNOSIS — C679 Malignant neoplasm of bladder, unspecified: Secondary | ICD-10-CM | POA: Insufficient documentation

## 2019-04-29 DIAGNOSIS — Z23 Encounter for immunization: Secondary | ICD-10-CM

## 2019-04-29 DIAGNOSIS — Z7982 Long term (current) use of aspirin: Secondary | ICD-10-CM | POA: Insufficient documentation

## 2019-04-29 DIAGNOSIS — E7800 Pure hypercholesterolemia, unspecified: Secondary | ICD-10-CM | POA: Insufficient documentation

## 2019-04-29 DIAGNOSIS — D649 Anemia, unspecified: Secondary | ICD-10-CM

## 2019-04-29 DIAGNOSIS — Z79899 Other long term (current) drug therapy: Secondary | ICD-10-CM | POA: Diagnosis not present

## 2019-04-29 DIAGNOSIS — Z7902 Long term (current) use of antithrombotics/antiplatelets: Secondary | ICD-10-CM | POA: Insufficient documentation

## 2019-04-29 DIAGNOSIS — F17210 Nicotine dependence, cigarettes, uncomplicated: Secondary | ICD-10-CM | POA: Diagnosis not present

## 2019-04-29 NOTE — Progress Notes (Signed)
[place], [place] [address]   CLINIC:  Medical Oncology/Hematology  PCP:  [facility] [address] [place] [id number] [phone]   REASON FOR VISIT:  Follow-up for bladder cancer.  CURRENT THERAPY: Surveillance.  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 Correlate with tissue sampling on cystoscopy.  No findings suspicious for upper tract disease.  Cholelithiasis, without associated inflammatory changes.    11/19/2016 Procedure    TURBT by [name] of a 2 centimeter anterior bladder wall tumor, placement of epirubicin intravesical    11/19/2016 Procedure    50 milligrams of epirubicin and 50 mL of diluent.  This was left indwelling for 1 hour by [name]    11/22/2016 Pathology Results    Bladder, transurethral resection, bladder tumor INFILTRATING HIGH GRADE UROTHELIAL CARCINOMA THE CARCINOMA INVADES MUSCULARIS PROPRIA (DETRUSOR MUSCLE) Microscopic Comment The neoplasm stains positive for high molecular weight cytokeratin, p63 and negative for prostein. The immunostain pattern supports the diagnosis of urothelial carcinoma.    12/18/2016 Imaging    Bilateral interstitial prominence. Active pneumonitis cannot be excluded. These changes could be also be related chronic interstitial lung disease.    12/30/2016 - 04/28/2017 Chemotherapy    The patient had palonosetron (ALOXI) injection 0.25 mg, 0.25 mg, Intravenous,  Once, 4 of 4 cycles  CISplatin (PLATINOL) 138 mg in sodium chloride 0.9 % 500 mL chemo infusion, 70 mg/m2 = 138 mg, Intravenous,  Once, 4 of 4 cycles  gemcitabine (GEMZAR) 1,976 mg in sodium chloride 0.9 % 250 mL chemo infusion, 1,000 mg/m2 = 1,976 mg, Intravenous,  Once,  4 of 4 cycles Dose modification: 800 mg/m2 (80 % of original dose 1,000 mg/m2, Cycle 3, Reason: Provider Judgment, Comment: Thrombocytopenia)  fosaprepitant (EMEND) 150 mg, dexamethasone (DECADRON) 12 mg in sodium chloride 0.9 % 145 mL IVPB, , Intravenous,  Once, 4 of 4 cycles  for chemotherapy treatment.      02/17/2017 Treatment Plan Change    Treatment deferred x 7 days due to thrombocytopenia    02/17/2017 Treatment Plan Change    Gemcitabine dose reduced by 20% due to thrombocytopenia    03/10/2017 Treatment Plan Change    Cisplatin dose-reduced by 20%    04/28/2017 Treatment Plan Change    Day 15 of cycle #4 is cancelled due to thrombocytopenia (68,000).    05/02/2017 Imaging    CT abd/pelvis at [facility]-no evidence of metastatic disease or recurrent focal urothelial lesion on noncontrast imaging.  There is mild diffuse bladder wall thickening.  Urothelial evaluation limited without contrast.  Cholelithiasis, sigmoid diverticulosis, aortic atherosclerosis.    07/30/2017 Definitive Surgery    Cystoprostatectomy by [name] with curative intent.    07/30/2017 Pathology Results    Specimen: Bladder and prostate Procedure: Cystoprostatectomy Tumor site (if known): anterior wall Maximum tumor size (cm): 0.4 cm Histology: urothelial carcinoma Grade: high grade Microscopic tumor extension: deeper muscularis propria Lymph - Vascular invasion: Negative Involvement of adjacent organs/structures: Negative Additional epithelial lesions: Carcinoma in situ Margins: negative Lymph nodes: number examined 15 ; number positive 0 TNM code: ypT2b, ypN 0,    07/30/2017 Cancer Staging    Cancer Staging Urothelial carcinoma of bladder [facility]) Staging form: Urinary Bladder, AJCC 8th Edition - Clinical stage from 12/05/2016:  Stage IIIA (cT3, cN0, cM0) - Signed by [name], [name] on 12/18/2016 - Pathologic stage from 12/10/2016: pT2, pN0, cM0 - Signed by [name], [name] on  12/10/2016 - Pathologic stage from 07/30/2017: Stage II (ypT2b, pN0, cM0) - Signed by [name], NP on 09/09/2017     11/26/2017 Imaging    CT abd./pelvis- 1. Several mildly enlarged lymph nodes are present including a 1.2 cm peripancreatic lymph node and a 1.4 cm low-density left external iliac lymph node or postoperative fluid collection. These merit surveillance. 2. A right middle lobe subpleural nodule along the minor fissure as a volume of 100 cubic mm. This level has not been included in prior cross-sectional imaging to assess for stability. Guidelines for follow up do not specifically apply due to history of malignancy. Well likely a benign subpleural lymph node, surveillance is likely warranted. 3. Other imaging findings of potential clinical significance: Aortic Atherosclerosis (ICD10-I70.0) and Emphysema (ICD10-J43.9). Coronary atherosclerosis with mitral and aortic valve calcification. Borderline appearance for distal esophageal wall thickening, query reflux. Several small left lower lobe nodules in the 3-4 mm range are probably inflammatory. Cholelithiasis. Cystoprostatectomy with loop ileostomy. Sigmoid diverticulosis. Notable common and external iliac stenosis bilaterally due to atherosclerosis. Lumbar impingement at L2- 3, L3-4, and L4-5.      CANCER STAGING: Cancer Staging Malignant papillary carcinoma of bladder [facility]) Staging form: Urinary Bladder, AJCC 8th Edition - Clinical stage from 12/05/2016: Stage IIIA (cT3, cN0, cM0) - Signed by [name], [name] on 12/18/2016 - Pathologic stage from 12/10/2016: pT2, pN0, cM0 - Signed by [name], [name] on 12/10/2016 - Pathologic stage from 07/30/2017: Stage II (ypT2b, pN0, cM0) - Signed by [name], NP on 09/09/2017    INTERVAL HISTORY:  Mr. Lingelbach 73 y.o. male returns for follow-up of bladder cancer.  Denies any hematuria.  Denies any weight loss in the last 6 months.  He is very active and  planted a garden in the last 1 week.  He is continuing to smoke cigarettes.  He reportedly had a hemorrhage from the rectal area in December and was admitted to the hospital for couple of days and had to receive blood transfusion.  He is continuing to be on Plavix.  His appetite is 50% and energy levels are 100%.      REVIEW OF SYSTEMS:  Review of Systems  Respiratory: Positive for shortness of breath.   Gastrointestinal: Negative for constipation.  All other systems reviewed and are negative.    PAST MEDICAL/SURGICAL HISTORY:  Past Medical History:  Diagnosis Date  . Arthritis   . Chronic renal disease, stage 3, moderately decreased glomerular filtration rate (GFR) between 30-59 mL/min/1.73 square meter (HCC) 11/28/2017  . Heart murmur   . History of kidney stones   . Hypercholesteremia   . Urothelial carcinoma ([place]) 12/10/2016  . 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 BIOPSY  11/13/2018   Procedure: BIOPSY;  Surgeon: Danie Binder, MD;  [facility];  Service: Endoscopy;;  gastric bx's  . CARDIAC CATHETERIZATION    . COLONOSCOPY WITH PROPOFOL N/A 11/14/2018   Procedure: COLONOSCOPY WITH PROPOFOL;  Surgeon: Danie Binder, MD;  [facility];  Service: Endoscopy;  Laterality: N/A;  . CYSTOSCOPY WITH INJECTION N/A 07/30/2017   Procedure: CYSTOSCOPY WITH INJECTION OF INDOCYANINE GREEN DYE;  Surgeon: Alexis Frock, MD;  Location: [facility];  Service: Urology;  Laterality: N/A;  .  ESOPHAGOGASTRODUODENOSCOPY (EGD) WITH PROPOFOL N/A 11/13/2018   Procedure: ESOPHAGOGASTRODUODENOSCOPY (EGD) WITH PROPOFOL;  Surgeon: Danie Binder, MD;  [facility];  Service: Endoscopy;  Laterality: N/A;  . FEMUR FRACTURE SURGERY Right   . LOWER EXTREMITY ANGIOGRAPHY Right 10/19/2018   Procedure: LOWER EXTREMITY ANGIOGRAPHY;  Surgeon: Algernon Huxley, MD;  Location: [facility];  Service: Cardiovascular;   Laterality: Right;  . MCT 3D RECONSTRUCTION ([facility] HX) Left    arm  . open reduction and internal fixation leg Right    hip and leg.  Marland Kitchen POLYPECTOMY  11/14/2018   Procedure: POLYPECTOMY;  Surgeon: Danie Binder, MD;  [facility];  Service: Endoscopy;;  . PORT-A-CATH REMOVAL N/A 07/20/2018   Procedure: REMOVAL PORT-A-CATH;  Surgeon: Aviva Signs, MD;  Location: [facility];  Service: General;  Laterality: N/A;  . PORTACATH PLACEMENT N/A 12/23/2016   Procedure: PLACEMENT OF TUNNELED CENTRAL VENOUS CATHETER RIGHT INTERNAL JUGULAR WITH SUBCUTANEOUS PORT;  Surgeon: Vickie Epley, MD;  Location: [facility];  Service: Vascular;  Laterality: N/A;  . reattatchment of left arm     from [place]  . TRANSURETHRAL RESECTION OF BLADDER TUMOR N/A 11/19/2016   Procedure: TRANSURETHRAL RESECTION OF BLADDER TUMOR (TURBT) WITH EPIRUBICIN INJECTION;  Surgeon: Franchot Gallo, MD;  Location: [facility];  Service: Urology;  Laterality: N/A;     SOCIAL HISTORY:  Social History   Socioeconomic History  . Marital status: Married    Spouse name: Not on file  . Number of children: Not on file  . Years of education: Not on file  . Highest education level: Not on file  Occupational History  . Not on file  Social Needs  . Financial resource strain: Not on file  . Food insecurity:    Worry: Not on file    Inability: Not on file  . Transportation needs:    Medical: Not on file    Non-medical: Not on file  Tobacco Use  . Smoking status: Former Smoker    Packs/day: 0.50    Years: 50.00    Pack years: 25.00    Types: Cigarettes    Last attempt to quit: 10/30/2018    Years since quitting: 0.4  . Smokeless tobacco: Never Used  Substance and Sexual Activity  . Alcohol use: No    Frequency: Never  . Drug use: No  . Sexual activity: Never    Birth control/protection: None    Comment: married-30 years-2 children by first wife  Lifestyle  . Physical activity:    Days per week: Not on file    Minutes  per session: Not on file  . Stress: Not on file  Relationships  . Social connections:    Talks on phone: Not on file    Gets together: Not on file    Attends religious service: Not on file    Active member of club or organization: Not on file    Attends meetings of clubs or organizations: Not on file    Relationship status: Not on file  . Intimate partner violence:    Fear of current or ex partner: Not on file    Emotionally abused: Not on file    Physically abused: Not on file    Forced sexual activity: Not on file  Other Topics Concern  . Not on file  Social History Narrative  . Not on file    FAMILY HISTORY:  Family History  Problem Relation Age of Onset  . Aneurysm  Mother   . Diabetes Father   . Colon cancer Brother     CURRENT MEDICATIONS:  Outpatient Encounter Medications as of 04/29/2019  Medication Sig Note  . aspirin EC 81 MG tablet Take 1 tablet (81 mg total) by mouth daily. (Patient taking differently: Take 81 mg by mouth every morning. )   . atorvastatin (LIPITOR) 10 MG tablet Take 1 tablet (10 mg total) by mouth daily. (Patient taking differently: Take 10 mg by mouth every morning. )   . cholecalciferol (VITAMIN D) 25 MCG (1000 UT) tablet Take 1,000 Units by mouth every morning.    . clopidogrel (PLAVIX) 75 MG tablet Take 1 tablet (75 mg total) by mouth daily. (Patient taking differently: Take 75 mg by mouth every morning. )   . morphine (MS CONTIN) 15 MG 12 hr tablet Take 15 mg by mouth every 12 (twelve) hours.    . Oxycodone HCl 10 MG TABS Take 1-2 tablets (10-20 mg total) by mouth every 4 (four) hours as needed (pain). Post-operatively (Patient taking differently: Take 10 mg by mouth every 3 (three) hours as needed (pain). Post-operatively)   . polyethylene glycol (MIRALAX) packet Take 17 g by mouth daily. 11/27/2018: PRN  . sodium bicarbonate 650 MG tablet Take 650 mg by mouth 2 (two) times daily.   . Vitamin D, Ergocalciferol, (DRISDOL) 1.25 MG (50000 UT) CAPS  capsule TAKE 1 TABLET BY MOUTH ONCE A MONTH   . FERREX 150 150 MG capsule    . pantoprazole (PROTONIX) 40 MG tablet Take 1 tablet (40 mg total) by mouth daily before breakfast. (Patient not taking: Reported on 04/29/2019)   . traZODone (DESYREL) 100 MG tablet Take by mouth.   . [DISCONTINUED] furosemide (LASIX) 20 MG tablet Take 20 mg by mouth every morning.    . [DISCONTINUED] lisinopril (ZESTRIL) 20 MG tablet Take by mouth.   . [DISCONTINUED] mirtazapine (REMERON) 15 MG tablet Take 15 mg by mouth at bedtime.     No facility-administered encounter medications on file as of 04/29/2019.     ALLERGIES:  Allergies  Allergen Reactions  . Penicillins Anaphylaxis    Has patient had a PCN reaction causing immediate rash, facial/tongue/throat swelling, SOB or lightheadedness with hypotension:Yes Has patient had a PCN reaction causing severe rash involving mucus membranes or skin necrosis:Yes Has patient had a PCN reaction that required hospitalization:No Has patient had a PCN reaction occurring within the last 10 years:No If all of the above answers are "NO", then may proceed with Cephalosporin use.   . Ciprofloxacin Itching    Itching at IV site. No hives or shortness of breathe.  . Ambien [Zolpidem Tartrate] Other (See Comments)    "Sleep walking"     PHYSICAL EXAM:  ECOG Performance status: 1  Vitals:   04/29/19 1415  BP: 140/69  Pulse: (!) 52  Resp: 18  Temp: 98.4 F (36.9 C)  SpO2: 99%   Filed Weights   04/29/19 1415  Weight: 155 lb 12.8 oz (70.7 kg)    Physical Exam Constitutional:      Appearance: He is well-developed.  Cardiovascular:     Rate and Rhythm: Regular rhythm.  Pulmonary:     Breath sounds: Normal breath sounds.  Abdominal:     Palpations: Abdomen is soft. There is no mass.     Tenderness: There is no abdominal tenderness.  Skin:    Findings: No rash.      LABORATORY DATA:  I have reviewed the labs as listed.  CBC  Component Value Date/Time    WBC 18.4 (H) 04/26/2019 1310   RBC 4.16 (L) 04/26/2019 1310   HGB 11.1 (L) 04/26/2019 1310   HCT 37.5 (L) 04/26/2019 1310   PLT 326 04/26/2019 1310   MCV 90.1 04/26/2019 1310   MCH 26.7 04/26/2019 1310   MCHC 29.6 (L) 04/26/2019 1310   RDW 14.1 04/26/2019 1310   LYMPHSABS 1.7 04/26/2019 1310   MONOABS 1.5 (H) 04/26/2019 1310   EOSABS 0.2 04/26/2019 1310   BASOSABS 0.1 04/26/2019 1310   CMP Latest Ref Rng & Units 04/26/2019 11/14/2018 11/13/2018  Glucose 70 - 99 mg/dL 115(H) 83 89  BUN 8 - 23 mg/dL 30(H) 21 28(H)  Creatinine 0.61 - 1.24 mg/dL 1.81(H) 1.64(H) 1.84(H)  Sodium 135 - 145 mmol/L 139 139 139  Potassium 3.5 - 5.1 mmol/L 4.0 4.5 4.7  Chloride 98 - 111 mmol/L 110 115(H) 115(H)  CO2 22 - 32 mmol/L 19(L) 17(L) 18(L)  Calcium 8.9 - 10.3 mg/dL 8.8(L) 8.2(L) 8.2(L)  Total Protein 6.5 - 8.1 g/dL 7.3 5.3(L) 5.5(L)  Total Bilirubin 0.3 - 1.2 mg/dL 0.7 0.6 0.5  Alkaline Phos 38 - 126 U/L 107 75 79  AST 15 - 41 U/L 11(L) 13(L) 14(L)  ALT 0 - 44 U/L 9 9 10        DIAGNOSTIC IMAGING:  I have independently reviewed images of this latest CT scans and reviewed them with the patient.     ASSESSMENT & PLAN:   Bladder cancer ([place]) 1.  Bladder cancer: - Underwent TURBT for a T2N0 urothelial bladder cancer on 11/19/2016. -As there was suspicion for muscle invasion, underwent cisplatin and gemcitabine 4 cycles from 12/30/2016 through 04/21/2017 followed by cystoprostatectomy on 07/30/2017, showing residual disease of 0.4 cm. - He denies any new onset pains or hematuria.  Denies any weight loss. - We reviewed results of the CT CAP dated 04/26/2019 which shows total number 2-4 mm pulmonary nodules scattered throughout the lungs bilaterally.  These are nonspecific and favored to be benign.  No evidence of metastatic disease in the abdomen or pelvis. - I have recommended a CT of the chest with contrast in 3 months to follow-up on his lung nodules.  2.  Normocytic anemia: -His hemoglobin is  11.1.  This is from CKD. - I will check his ferritin, iron panel, F02 and folic acid prior to next visit. -He was recently hospitalized from 11/12/2018 through 11/14/2018 with a hemoglobin of 8.4 when he had rectal bleed.  He received 1 unit of blood transfusion. - He is currently on Plavix 75 mg daily and denies any bleeding per rectum since he was discharged from the hospital.  Total time spent is 25 minutes with more than 50% of the time spent face-to-face discussing scan results, the need for surveillance and coordination of care.    Orders placed this encounter:  Orders Placed This Encounter  Procedures  . CT Chest W Contrast  . CBC with Differential/Platelet  . Comprehensive metabolic panel  . Iron and TIBC  . Ferritin  . Vitamin B12  . Folate      Derek Jack, MD [place] [phone]

## 2019-04-29 NOTE — Assessment & Plan Note (Signed)
1.  Bladder cancer: - Underwent TURBT for a T2N0 urothelial bladder cancer on 11/19/2016. -As there was suspicion for muscle invasion, underwent cisplatin and gemcitabine 4 cycles from 12/30/2016 through 04/21/2017 followed by cystoprostatectomy on 07/30/2017, showing residual disease of 0.4 cm. - He denies any new onset pains or hematuria.  Denies any weight loss. - We reviewed results of the CT CAP dated 04/26/2019 which shows total number 2-4 mm pulmonary nodules scattered throughout the lungs bilaterally.  These are nonspecific and favored to be benign.  No evidence of metastatic disease in the abdomen or pelvis. - I have recommended a CT of the chest with contrast in 3 months to follow-up on his lung nodules.  2.  Normocytic anemia: -His hemoglobin is 11.1.  This is from CKD. - I will check his ferritin, iron panel, J68 and folic acid prior to next visit. -He was recently hospitalized from 11/12/2018 through 11/14/2018 with a hemoglobin of 8.4 when he had rectal bleed.  He received 1 unit of blood transfusion. - He is currently on Plavix 75 mg daily and denies any bleeding per rectum since he was discharged from the hospital.

## 2019-04-29 NOTE — Patient Instructions (Signed)
[facility] at [facility] Discharge Instructions  You were seen today by Dr. Katragadda. He went over your recent lab results. He will see you back in 3 months for labs and follow up.   Thank you for choosing [facility] at [facility] to provide your oncology and hematology care.  To afford each patient quality time with our provider, please arrive at least 15 minutes before your scheduled appointment time.   If you have a lab appointment with the Cancer Center please come in thru the  Main Entrance and check in at the main information desk  You need to re-schedule your appointment should you arrive 10 or more minutes late.  We strive to give you quality time with our providers, and arriving late affects you and other patients whose appointments are after yours.  Also, if you no show three or more times for appointments you may be dismissed from the clinic at the providers discretion.     Again, thank you for choosing [facility].  Our hope is that these requests will decrease the amount of time that you wait before being seen by our physicians.       _____________________________________________________________  Should you have questions after your visit to [facility], please contact our office at [phone] between the hours of 8:00 a.m. and 4:30 p.m.  Voicemails left after 4:00 p.m. will not be returned until the following business day.  For prescription refill requests, have your pharmacy contact our office and allow 72 hours.    Cancer Center Support Programs:   > Cancer Support Group  2nd Tuesday of the month 1pm-2pm, Journey Room    

## 2019-07-23 ENCOUNTER — Other Ambulatory Visit (INDEPENDENT_AMBULATORY_CARE_PROVIDER_SITE_OTHER): Payer: Self-pay | Admitting: Vascular Surgery

## 2019-08-02 ENCOUNTER — Ambulatory Visit (HOSPITAL_COMMUNITY)
Admission: RE | Admit: 2019-08-02 | Discharge: 2019-08-02 | Disposition: A | Discharge status: Home / Self Care | Payer: Medicare HMO | Source: Ambulatory Visit | Attending: Hematology | Admitting: Hematology

## 2019-08-02 ENCOUNTER — Other Ambulatory Visit: Payer: Self-pay

## 2019-08-02 ENCOUNTER — Inpatient Hospital Stay (HOSPITAL_COMMUNITY): Payer: Medicare HMO | Attending: Hematology

## 2019-08-02 DIAGNOSIS — Z833 Family history of diabetes mellitus: Secondary | ICD-10-CM | POA: Insufficient documentation

## 2019-08-02 DIAGNOSIS — Z87891 Personal history of nicotine dependence: Secondary | ICD-10-CM | POA: Insufficient documentation

## 2019-08-02 DIAGNOSIS — C673 Malignant neoplasm of anterior wall of bladder: Secondary | ICD-10-CM | POA: Diagnosis not present

## 2019-08-02 DIAGNOSIS — D631 Anemia in chronic kidney disease: Secondary | ICD-10-CM | POA: Insufficient documentation

## 2019-08-02 DIAGNOSIS — Z800 Family history of malignant neoplasm of digestive organs: Secondary | ICD-10-CM | POA: Insufficient documentation

## 2019-08-02 DIAGNOSIS — Z8249 Family history of ischemic heart disease and other diseases of the circulatory system: Secondary | ICD-10-CM | POA: Diagnosis not present

## 2019-08-02 DIAGNOSIS — C679 Malignant neoplasm of bladder, unspecified: Secondary | ICD-10-CM

## 2019-08-02 DIAGNOSIS — Z9221 Personal history of antineoplastic chemotherapy: Secondary | ICD-10-CM | POA: Insufficient documentation

## 2019-08-02 DIAGNOSIS — Z87442 Personal history of urinary calculi: Secondary | ICD-10-CM | POA: Insufficient documentation

## 2019-08-02 DIAGNOSIS — E611 Iron deficiency: Secondary | ICD-10-CM | POA: Diagnosis not present

## 2019-08-02 DIAGNOSIS — N183 Chronic kidney disease, stage 3 (moderate): Secondary | ICD-10-CM | POA: Insufficient documentation

## 2019-08-02 DIAGNOSIS — Z79899 Other long term (current) drug therapy: Secondary | ICD-10-CM | POA: Diagnosis not present

## 2019-08-02 LAB — COMPREHENSIVE METABOLIC PANEL
ALT: 10 U/L (ref 0–44)
AST: 15 U/L (ref 15–41)
Albumin: 4.1 g/dL (ref 3.5–5.0)
Alkaline Phosphatase: 104 U/L (ref 38–126)
Anion gap: 8 (ref 5–15)
BUN: 27 mg/dL — ABNORMAL HIGH (ref 8–23)
CO2: 21 mmol/L — ABNORMAL LOW (ref 22–32)
Calcium: 9 mg/dL (ref 8.9–10.3)
Chloride: 108 mmol/L (ref 98–111)
Creatinine, Ser: 1.69 mg/dL — ABNORMAL HIGH (ref 0.61–1.24)
GFR calc Af Amer: 46 mL/min — ABNORMAL LOW (ref >60)
GFR calc non Af Amer: 39 mL/min — ABNORMAL LOW (ref >60)
Glucose, Bld: 101 mg/dL — ABNORMAL HIGH (ref 70–99)
Potassium: 4.8 mmol/L (ref 3.5–5.1)
Sodium: 137 mmol/L (ref 135–145)
Total Bilirubin: 0.6 mg/dL (ref 0.3–1.2)
Total Protein: 7.5 g/dL (ref 6.5–8.1)

## 2019-08-02 LAB — CBC WITH DIFFERENTIAL/PLATELET
Abs Immature Granulocytes: 0.03 10*3/uL (ref 0.00–0.07)
Basophils Absolute: 0 10*3/uL (ref 0.0–0.1)
Basophils Relative: 0 %
Eosinophils Absolute: 0.4 10*3/uL (ref 0.0–0.5)
Eosinophils Relative: 5 %
HCT: 43.6 % (ref 39.0–52.0)
Hemoglobin: 13.3 g/dL (ref 13.0–17.0)
Immature Granulocytes: 0 %
Lymphocytes Relative: 26 %
Lymphs Abs: 2.4 10*3/uL (ref 0.7–4.0)
MCH: 28.1 pg (ref 26.0–34.0)
MCHC: 30.5 g/dL (ref 30.0–36.0)
MCV: 92 fL (ref 80.0–100.0)
Monocytes Absolute: 0.6 10*3/uL (ref 0.1–1.0)
Monocytes Relative: 7 %
Neutro Abs: 5.6 10*3/uL (ref 1.7–7.7)
Neutrophils Relative %: 62 %
Platelets: 249 10*3/uL (ref 150–400)
RBC: 4.74 MIL/uL (ref 4.22–5.81)
RDW: 14.2 % (ref 11.5–15.5)
WBC: 9.1 10*3/uL (ref 4.0–10.5)
nRBC: 0 % (ref 0.0–0.2)

## 2019-08-02 LAB — IRON AND TIBC
Iron: 51 ug/dL (ref 45–182)
Saturation Ratios: 14 % — ABNORMAL LOW (ref 17.9–39.5)
TIBC: 361 ug/dL (ref 250–450)
UIBC: 310 ug/dL

## 2019-08-02 LAB — FERRITIN: Ferritin: 17 ng/mL — ABNORMAL LOW (ref 24–336)

## 2019-08-02 LAB — VITAMIN B12: Vitamin B-12: 273 pg/mL (ref 180–914)

## 2019-08-02 LAB — FOLATE: Folate: 11.9 ng/mL (ref >5.9)

## 2019-08-02 MED ORDER — IOHEXOL 300 MG/ML  SOLN
60.0000 mL | Freq: Once | INTRAMUSCULAR | Status: AC | PRN
  Administered 2019-08-02: 15:00:00 60 mL via INTRAVENOUS

## 2019-08-04 ENCOUNTER — Encounter (HOSPITAL_COMMUNITY): Payer: Self-pay | Admitting: Hematology

## 2019-08-04 ENCOUNTER — Inpatient Hospital Stay (HOSPITAL_BASED_OUTPATIENT_CLINIC_OR_DEPARTMENT_OTHER): Payer: Medicare HMO | Admitting: Hematology

## 2019-08-04 ENCOUNTER — Other Ambulatory Visit: Payer: Self-pay

## 2019-08-04 VITALS — BP 158/68 | HR 87 | Temp 97.3°F | Resp 18 | Wt 159.0 lb

## 2019-08-04 DIAGNOSIS — C673 Malignant neoplasm of anterior wall of bladder: Secondary | ICD-10-CM | POA: Diagnosis not present

## 2019-08-04 DIAGNOSIS — C679 Malignant neoplasm of bladder, unspecified: Secondary | ICD-10-CM | POA: Diagnosis not present

## 2019-08-04 NOTE — Assessment & Plan Note (Signed)
1.  Bladder cancer: -Underwent TURBT for a T2N0 urothelial bladder cancer on 11/19/2016. - As there was suspicion for muscle invasion, underwent cisplatin and gemcitabine 4 cycles from 12/30/2016 through 04/21/2017 followed by cystoprostatectomy on 07/30/2017, showing residual disease of 0.4 cm. - CT CAP on 04/26/2019 showed subcentimeter pleural nodules in the bilateral lungs.  No evidence of metastatic disease in the abdomen or pelvis. - He denies any hematuria.  No fevers, night sweats or weight loss. - CT chest with contrast on 08/02/2019 showed waxing and waning small bilateral pulmonary nodules.  Previously visualized tiny pulmonary nodules on 04/26/2019 chest CT are stable to decreased in size.  Several new small solid nodules, largest 5 mm.  More suggestive of inflammatory nodularity. -Based on these findings, I have recommended follow-up scans in 4 months.  We will see him back after scans.  2.  Normocytic anemia: - This is from CKD and iron deficiency. -He is taking iron tablet daily.  Hemoglobin today is 13.3.  Ferritin is low at 17.  Percent saturation is 14.  Creatinine is stable around 1.69. -I have recommended him to continue taking iron tablet daily.  He denies any bleeding per rectum or melena.

## 2019-08-04 NOTE — Patient Instructions (Addendum)
[facility] at [facility] Discharge Instructions  You were seen today by Dr. Katragadda. He went over your recent lab results. He will see you back in 4 months for labs and follow up.   Thank you for choosing [facility] at [facility] to provide your oncology and hematology care.  To afford each patient quality time with our provider, please arrive at least 15 minutes before your scheduled appointment time.   If you have a lab appointment with the Cancer Center please come in thru the  Main Entrance and check in at the main information desk  You need to re-schedule your appointment should you arrive 10 or more minutes late.  We strive to give you quality time with our providers, and arriving late affects you and other patients whose appointments are after yours.  Also, if you no show three or more times for appointments you may be dismissed from the clinic at the providers discretion.     Again, thank you for choosing [facility].  Our hope is that these requests will decrease the amount of time that you wait before being seen by our physicians.       _____________________________________________________________  Should you have questions after your visit to [facility], please contact our office at [phone] between the hours of 8:00 a.m. and 4:30 p.m.  Voicemails left after 4:00 p.m. will not be returned until the following business day.  For prescription refill requests, have your pharmacy contact our office and allow 72 hours.    Cancer Center Support Programs:   > Cancer Support Group  2nd Tuesday of the month 1pm-2pm, Journey Room    

## 2019-08-04 NOTE — Progress Notes (Signed)
Jeffery Ponce, Jeffery Ponce   CLINIC:  Medical Oncology/Hematology  PCP:  [facility] [address] [place] [id number] [phone]   REASON FOR VISIT:  Follow-up for bladder cancer.  CURRENT THERAPY: Surveillance.  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 Correlate with tissue sampling on cystoscopy.  No findings suspicious for upper tract disease.  Cholelithiasis, without associated inflammatory changes.   11/19/2016 Procedure   TURBT by [name] of a 2 centimeter anterior bladder wall tumor, placement of epirubicin intravesical   11/19/2016 Procedure   50 milligrams of epirubicin and 50 mL of diluent.  This was left indwelling for 1 hour by [name]   11/22/2016 Pathology Results   Bladder, transurethral resection, bladder tumor INFILTRATING HIGH GRADE UROTHELIAL CARCINOMA THE CARCINOMA INVADES MUSCULARIS PROPRIA (DETRUSOR MUSCLE) Microscopic Comment The neoplasm stains positive for high molecular weight cytokeratin, p63 and negative for prostein. The immunostain pattern supports the diagnosis of urothelial carcinoma.   12/18/2016 Imaging   Bilateral interstitial prominence. Active pneumonitis cannot be excluded. These changes could be also be related chronic interstitial lung disease.   12/30/2016 - 04/28/2017 Chemotherapy   The patient had palonosetron (ALOXI) injection 0.25 mg, 0.25 mg, Intravenous,  Once, 4 of 4 cycles  CISplatin (PLATINOL) 138 mg in sodium chloride 0.9 % 500 mL chemo infusion, 70 mg/m2 = 138 mg, Intravenous,  Once, 4 of 4 cycles  gemcitabine (GEMZAR) 1,976 mg in sodium chloride 0.9 % 250 mL chemo infusion, 1,000 mg/m2 = 1,976 mg, Intravenous,   Once, 4 of 4 cycles Dose modification: 800 mg/m2 (80 % of original dose 1,000 mg/m2, Cycle 3, Reason: Provider Judgment, Comment: Thrombocytopenia)  fosaprepitant (EMEND) 150 mg, dexamethasone (DECADRON) 12 mg in sodium chloride 0.9 % 145 mL IVPB, , Intravenous,  Once, 4 of 4 cycles  for chemotherapy treatment.     02/17/2017 Treatment Plan Change   Treatment deferred x 7 days due to thrombocytopenia   02/17/2017 Treatment Plan Change   Gemcitabine dose reduced by 20% due to thrombocytopenia   03/10/2017 Treatment Plan Change   Cisplatin dose-reduced by 20%   04/28/2017 Treatment Plan Change   Day 15 of cycle #4 is cancelled due to thrombocytopenia (68,000).   05/02/2017 Imaging   CT abd/pelvis at [facility]-no evidence of metastatic disease or recurrent focal urothelial lesion on noncontrast imaging.  There is mild diffuse bladder wall thickening.  Urothelial evaluation limited without contrast.  Cholelithiasis, sigmoid diverticulosis, aortic atherosclerosis.   07/30/2017 Definitive Surgery   Cystoprostatectomy by [name] with curative intent.   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 Several mildly enlarged lymph nodes are present including a 1.2 cm peripancreatic lymph node and a 1.4 cm low-density left external iliac lymph node or postoperative fluid collection. These merit surveillance. 2. A right middle lobe subpleural nodule along the minor fissure as a volume of 100 cubic mm. This level has not been included in prior cross-sectional imaging to assess for stability. Guidelines for follow up do not specifically apply due to history of malignancy. Well likely a benign subpleural lymph node, surveillance is likely warranted. 3. Other imaging findings of potential clinical significance: Aortic Atherosclerosis (ICD10-I70.0) and Emphysema (ICD10-J43.9). Coronary atherosclerosis with mitral and aortic valve calcification. Borderline appearance for distal esophageal wall thickening, query reflux. Several small left lower lobe nodules in the 3-4 mm range are probably inflammatory. Cholelithiasis. Cystoprostatectomy with loop ileostomy. Sigmoid diverticulosis. Notable common and external iliac stenosis bilaterally due to atherosclerosis. Lumbar impingement at L2- 3, L3-4, and L4-5.      CANCER STAGING: Cancer Staging Malignant papillary carcinoma of bladder [facility]) Staging form: Urinary Bladder, AJCC 8th Edition - Clinical stage from 12/05/2016: Stage IIIA (cT3, cN0, cM0) - Signed by [name], [name] on 12/18/2016 - Pathologic stage from 12/10/2016: pT2, pN0, cM0 - Signed by [name], [name] on 12/10/2016 - Pathologic stage from 07/30/2017: Stage II (ypT2b, pN0, cM0) - Signed by [name], NP on 09/09/2017    INTERVAL HISTORY:  Jeffery Ponce 73 y.o. male seen for follow-up of bladder cancer.  Denies any hematuria.  He is continuing to work in his vegetable garden and is being very active.  Appetite and  energy levels are 100%.  Pain is reported as 0.  Easy bruising was reported.  Denies any bleeding per rectum or melena.  Denies any nausea, vomiting,  diarrhea or constipation.  Denies any recent transfusions, ER visits or hospitalizations.    REVIEW OF SYSTEMS:  Review of Systems  Hematological: Bruises/bleeds easily.  All other systems reviewed and are negative.    PAST MEDICAL/SURGICAL HISTORY:  Past Medical History:  Diagnosis Date  . Arthritis   . Chronic renal disease, stage 3, moderately decreased glomerular filtration rate (GFR) between 30-59 mL/min/1.73 square meter (HCC) 11/28/2017  . Heart murmur   . History of kidney stones   . Hypercholesteremia   . Urothelial carcinoma ([place]) 12/10/2016  . 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 BIOPSY  11/13/2018   Procedure: BIOPSY;  Surgeon: Danie Binder, MD;  [facility];  Service: Endoscopy;;  gastric bx's  . CARDIAC CATHETERIZATION    . COLONOSCOPY WITH PROPOFOL N/A 11/14/2018   Procedure: COLONOSCOPY WITH PROPOFOL;  Surgeon: Danie Binder, MD;  [facility]: [facility];  Service: Endoscopy;  Laterality: N/A;  . CYSTOSCOPY WITH INJECTION N/A 07/30/2017   Procedure: CYSTOSCOPY WITH INJECTION OF INDOCYANINE GREEN DYE;  Surgeon: Alexis Frock, MD;  Location: [facility];  Service: Urology;  Laterality: N/A;  . ESOPHAGOGASTRODUODENOSCOPY (EGD) WITH PROPOFOL N/A 11/13/2018   Procedure: ESOPHAGOGASTRODUODENOSCOPY (EGD) WITH PROPOFOL;  Surgeon: Danie Binder, MD;  [facility];  Service: Endoscopy;  Laterality: N/A;  . FEMUR FRACTURE SURGERY Right   . LOWER EXTREMITY ANGIOGRAPHY Right 10/19/2018   Procedure: LOWER EXTREMITY ANGIOGRAPHY;  Surgeon: Algernon Huxley, MD;  Location: [facility];  Service: Cardiovascular;  Laterality: Right;  . MCT 3D RECONSTRUCTION ([facility] HX) Left    arm  . open reduction and internal fixation leg Right    hip and leg.   Marland Kitchen POLYPECTOMY  11/14/2018   Procedure: POLYPECTOMY;  Surgeon: Danie Binder, MD;  [facility];  Service: Endoscopy;;  . PORT-A-CATH REMOVAL N/A 07/20/2018   Procedure: REMOVAL PORT-A-CATH;  Surgeon: Aviva Signs, MD;  Location: [facility];  Service: General;  Laterality: N/A;  . PORTACATH PLACEMENT N/A 12/23/2016   Procedure: PLACEMENT OF TUNNELED CENTRAL VENOUS CATHETER RIGHT INTERNAL JUGULAR WITH SUBCUTANEOUS PORT;  Surgeon: Vickie Epley, MD;  Location: [facility];  Service: Vascular;  Laterality: N/A;  . reattatchment of left arm     from [place]  . TRANSURETHRAL RESECTION OF BLADDER TUMOR N/A 11/19/2016   Procedure: TRANSURETHRAL RESECTION OF BLADDER TUMOR (TURBT) WITH EPIRUBICIN INJECTION;  Surgeon: Franchot Gallo, MD;  Location: [facility];  Service: Urology;  Laterality: N/A;     SOCIAL HISTORY:  Social History   Socioeconomic History  . Marital status: Married    Spouse name: Not on file  . Number of children: Not on file  . Years of education: Not on file  . Highest education level: Not on file  Occupational History  . Not on file  Social Needs  . Financial resource strain: Not on file  . Food insecurity    Worry: Not on file    Inability: Not on file  . Transportation needs    Medical: Not on file    Non-medical: Not on file  Tobacco Use  . Smoking status: Former Smoker    Packs/day: 0.50    Years: 50.00    Pack years: 25.00    Types: Cigarettes    Quit date: 10/30/2018    Years since quitting: 0.7  . Smokeless tobacco: Never Used  Substance and Sexual Activity  . Alcohol use: No    Frequency: Never  . Drug use: No  . Sexual activity: Never    Birth control/protection: None    Comment: married-30 years-2 children by first wife  Lifestyle  . Physical activity    Days per week: Not on file    Minutes per session: Not on file  . Stress: Not on file  Relationships  . Social Herbalist on phone: Not on file    Gets together: Not on  file    Attends religious service: Not on file    Active member of club or organization: Not on file    Attends meetings of clubs or organizations: Not on file    Relationship status: Not on file  . Intimate partner violence    Fear of current or ex partner: Not on file    Emotionally abused: Not on file    Physically abused: Not on file    Forced sexual activity: Not on file  Other Topics Concern  . Not on file  Social History Narrative  . Not on file    FAMILY HISTORY:  Family History  Problem Relation Age of Onset  . Aneurysm Mother   . Diabetes Father   . Colon cancer Brother     CURRENT MEDICATIONS:  Outpatient Encounter Medications as of 08/04/2019  Medication Sig Note  . atorvastatin (LIPITOR) 10 MG tablet Take 1 tablet (10 mg total) by mouth daily. (Patient taking differently: Take 10 mg by mouth every morning. )   .  cholecalciferol (VITAMIN D) 25 MCG (1000 UT) tablet Take 1,000 Units by mouth every morning.    Marland Kitchen FERREX 150 150 MG capsule    . GNP ASPIRIN LOW DOSE 81 MG EC tablet TAKE 1 TABLET BY MOUTH ONCE DAILY.   Marland Kitchen morphine (MS CONTIN) 15 MG 12 hr tablet Take 15 mg by mouth every 12 (twelve) hours.    . Oxycodone HCl 10 MG TABS Take 1-2 tablets (10-20 mg total) by mouth every 4 (four) hours as needed (pain). Post-operatively (Patient taking differently: Take 10 mg by mouth every 3 (three) hours as needed (pain). Post-operatively)   . sodium bicarbonate 650 MG tablet Take 650 mg by mouth 2 (two) times daily.   . Vitamin D, Ergocalciferol, (DRISDOL) 1.25 MG (50000 UT) CAPS capsule TAKE 1 TABLET BY MOUTH ONCE A MONTH   . clopidogrel (PLAVIX) 75 MG tablet Take 1 tablet (75 mg total) by mouth daily. (Patient not taking: Reported on 08/04/2019)   . pantoprazole (PROTONIX) 40 MG tablet Take 1 tablet (40 mg total) by mouth daily before breakfast. (Patient not taking: Reported on 04/29/2019)   . polyethylene glycol (MIRALAX) packet Take 17 g by mouth daily. (Patient not taking:  Reported on 08/04/2019) 11/27/2018: PRN  . traZODone (DESYREL) 100 MG tablet Take by mouth.    No facility-administered encounter medications on file as of 08/04/2019.     ALLERGIES:  Allergies  Allergen Reactions  . Penicillins Anaphylaxis    Has patient had a PCN reaction causing immediate rash, facial/tongue/throat swelling, SOB or lightheadedness with hypotension:Yes Has patient had a PCN reaction causing severe rash involving mucus membranes or skin necrosis:Yes Has patient had a PCN reaction that required hospitalization:No Has patient had a PCN reaction occurring within the last 10 years:No If all of the above answers are "NO", then may proceed with Cephalosporin use.   . Ciprofloxacin Itching    Itching at IV site. No hives or shortness of breathe.  . Ambien [Zolpidem Tartrate] Other (See Comments)    "Sleep walking"     PHYSICAL EXAM:  ECOG Performance status: 1  Vitals:   08/04/19 1433  BP: (!) 158/68  Pulse: 87  Resp: 18  Temp: (!) 97.3 F (36.3 C)  SpO2: 97%   Filed Weights   08/04/19 1433  Weight: 159 lb (72.1 kg)    Physical Exam Vitals signs reviewed.  Constitutional:      Appearance: Normal appearance. He is well-developed.  Cardiovascular:     Rate and Rhythm: Normal rate and regular rhythm.     Heart sounds: Normal heart sounds.  Pulmonary:     Effort: Pulmonary effort is normal.     Breath sounds: Normal breath sounds.  Abdominal:     General: There is no distension.     Palpations: Abdomen is soft. There is no mass.  Skin:    General: Skin is warm.     Findings: No rash.  Neurological:     General: No focal deficit present.     Mental Status: He is alert and oriented to person, place, and time.  Psychiatric:        Mood and Affect: Mood normal.        Behavior: Behavior normal.      LABORATORY DATA:  I have reviewed the labs as listed.  CBC    Component Value Date/Time   WBC 9.1 08/02/2019 1326   RBC 4.74 08/02/2019 1326   HGB  13.3 08/02/2019 1326   HCT 43.6 08/02/2019  1326   PLT 249 08/02/2019 1326   MCV 92.0 08/02/2019 1326   MCH 28.1 08/02/2019 1326   MCHC 30.5 08/02/2019 1326   RDW 14.2 08/02/2019 1326   LYMPHSABS 2.4 08/02/2019 1326   MONOABS 0.6 08/02/2019 1326   EOSABS 0.4 08/02/2019 1326   BASOSABS 0.0 08/02/2019 1326   CMP Latest Ref Rng & Units 08/02/2019 04/26/2019 11/14/2018  Glucose 70 - 99 mg/dL 101(H) 115(H) 83  BUN 8 - 23 mg/dL 27(H) 30(H) 21  Creatinine 0.61 - 1.24 mg/dL 1.69(H) 1.81(H) 1.64(H)  Sodium 135 - 145 mmol/L 137 139 139  Potassium 3.5 - 5.1 mmol/L 4.8 4.0 4.5  Chloride 98 - 111 mmol/L 108 110 115(H)  CO2 22 - 32 mmol/L 21(L) 19(L) 17(L)  Calcium 8.9 - 10.3 mg/dL 9.0 8.8(L) 8.2(L)  Total Protein 6.5 - 8.1 g/dL 7.5 7.3 5.3(L)  Total Bilirubin 0.3 - 1.2 mg/dL 0.6 0.7 0.6  Alkaline Phos 38 - 126 U/L 104 107 75  AST 15 - 41 U/L 15 11(L) 13(L)  ALT 0 - 44 U/L 10 9 9        DIAGNOSTIC IMAGING:  I have independently reviewed images of this latest CT scans and reviewed them with the patient.     ASSESSMENT & PLAN:   Bladder cancer ([place]) 1.  Bladder cancer: -Underwent TURBT for a T2N0 urothelial bladder cancer on 11/19/2016. - As there was suspicion for muscle invasion, underwent cisplatin and gemcitabine 4 cycles from 12/30/2016 through 04/21/2017 followed by cystoprostatectomy on 07/30/2017, showing residual disease of 0.4 cm. - CT CAP on 04/26/2019 showed subcentimeter pleural nodules in the bilateral lungs.  No evidence of metastatic disease in the abdomen or pelvis. - He denies any hematuria.  No fevers, night sweats or weight loss. - CT chest with contrast on 08/02/2019 showed waxing and waning small bilateral pulmonary nodules.  Previously visualized tiny pulmonary nodules on 04/26/2019 chest CT are stable to decreased in size.  Several new small solid nodules, largest 5 mm.  More suggestive of inflammatory nodularity. -Based on these findings, I have recommended follow-up scans  in 4 months.  We will see him back after scans.  2.  Normocytic anemia: - This is from CKD and iron deficiency. -He is taking iron tablet daily.  Hemoglobin today is 13.3.  Ferritin is low at 17.  Percent saturation is 14.  Creatinine is stable around 1.69. -I have recommended him to continue taking iron tablet daily.  He denies any bleeding per rectum or melena.  Total time spent is 25 minutes with more than 50% of the time spent face-to-face discussing scan results, the need for surveillance and coordination of care.    Orders placed this encounter:  Orders Placed This Encounter  Procedures  . CT Chest W Contrast  . CT Abdomen Pelvis W Contrast  . CBC with Differential/Platelet  . Comprehensive metabolic panel  . Iron and TIBC  . Ferritin  . Vitamin B12  . Folate      Derek Jack, MD [place] [phone]

## 2019-09-20 ENCOUNTER — Other Ambulatory Visit (INDEPENDENT_AMBULATORY_CARE_PROVIDER_SITE_OTHER): Payer: Self-pay | Admitting: Vascular Surgery

## 2019-10-02 NOTE — Progress Notes (Signed)
Subjective:    Patient ID: Jeffery Ponce, male    DOB: 1946-02-03, 73 y.o.   MRN: [id number]  HPI Jeffery Ponce is a 73 year old male with a past medical history significant for bladder cancer treated with chemo then status post cystoprostatectomy August 2018 followed by Dr. Delton Coombes, CKD stage III, colon polyps and GI bleed 11/2018.  He reports having peripheral arterial disease and he has 2 stents in his legs. Plavix was stopped 6 months ago.  He is taking aspirin 81 mg once daily.  He presents today for further evaluation regarding a heme positive stool test which was completed by his PCP 2 months ago.  He is  taking oral iron daily.  I explained to the heme slides will produce a false positive if completed while taking oral iron.  He confirmed he was taking oral iron when heme slide was completed and developed.  He has a history of a GI bleed December 2019.  An EGD 11/13/2018 showed a small hiatal hernia with mild gastritis and duodenal duodenitis without evidence of GI bleeding.  A colonoscopy was done the next day which identified diverticulosis and a single nonbleeding AVM.  Dr. Oneida Alar assessed his GI bleed was most likely a diverticular bleed.  Currently,  he reports feeling well.  He denies having any dysphagia, heartburn or stomach pain.  He is passing a normal formed darker brown stool without obvious melena or rectal bleeding.  No lower abdominal pain.  He passes clear yellow urine into a urostomy bag. No hematuria. He denies having any weight loss.  No fever, sweats or chills.  No alcohol use.  He has reduced his cigarette smoking to 1/2 pack/day.  No family history of gastric or colorectal cancer.  Father deceased with history of diabetes.  Mother deceased with history of an aneurysm.  Labs 08/02/2019: Hemoglobin 13.3.  Ferritin 18.  Iron saturation 14%.  He was advised by his primary care physician to follow-up with Dr. Melony Overly to consider repeat EGD and colonoscopy.  However, the patient  prefers to avoid any invasive procedure unless absolutely necessary.  I explained the patient I will repeat his laboratory studies now and further recommendations will be based on these results.  He may require a small bowel capsule endoscopy before a repeat EGD or colonoscopy would be recommended as he denies seeing any melena or obvious rectal bleeding.  EGD 11/13/2018: NO SOURCE FOR BRBPR/MELENA FOUND. - Small hiatal hernia. - MILD Gastritis/Duodenitis.  Colonoscopy 11/14/2018 by Dr. Barney Drain: - RECTAL BLEEDING MOST LIKELY DUE TO DIVERTICULAR BLEED, LESS LIKELY R COLON AVM OR SMALL BOWEL SOURCE. - A single non-bleeding colonic angioectasia. Treated with argon plasma coagulation (AP[facility]). - Diverticulosis in the recto-sigmoid colon, in the sigmoid colon, in the descending colon and in the distal ascending colon. - Six 4 to 7 mm polyps in the rectum and in the distal ascending colon, removed with a cold snare. Biopsies showed polyps in the distal ascending colon were tubular adenomatous, rectal polyps were hyperplastic.  - Internal hemorrhoids. - Tortuous colon.  1. Colon, polyp(s), ascending - TUBULAR ADENOMA, NEGATIVE FOR HIGH GRADE DYSPLASIA. 2. Rectum, polyp(s) - HYPERPLASTIC POLYP.  Review of Systems see HPI, all other systems reviewed and are negative     Objective:   Physical Exam  BP 120/73   Pulse 98   Temp 98.4 F (36.9 C)   Ht 5\' 10"  (1.778 m)   Wt 161 lb 1.6 oz (73.1 kg)  BMI 23.12 kg/m  General: 73 year old male in no acute distress Eyes: Sclera nonicteric, conjunctiva pink Mouth: No ulcers or lesions, poor dentition Neck: Supple, no lymphadenopathy Heart: Regular rate and rhythm, no murmurs Lungs: Breath sounds clear throughout Abdomen: Soft, nontender, no masses or organomegaly, urostomy bag intact Extremities: No edema Neuro: Alert and oriented x4, no focal deficits    Assessment & Plan:   23.  73 year old male with a history of a GI bleed 11/2018,  most likely was a diverticular bleed less likely from a single colonic AVM,  presents today for follow-up after heme slide tested positive and laboratory studies 08/02/2019 showed continued iron deficiency anemia.  He denies seeing any melena or rectal bleeding. -Repeat CBC and iron panel today -I would recommend a small bowel capsule endoscopy if the above laboratory results continue to show iron deficiency anemia -He would  require repeat EGD and/or colonoscopy if he develops active GI bleeding  2.  History of colon polyps -Next screening colonoscopy due December 2024  3.  History of bladder cancer

## 2019-10-05 ENCOUNTER — Encounter (INDEPENDENT_AMBULATORY_CARE_PROVIDER_SITE_OTHER): Payer: Self-pay | Admitting: Nurse Practitioner

## 2019-10-05 ENCOUNTER — Ambulatory Visit (INDEPENDENT_AMBULATORY_CARE_PROVIDER_SITE_OTHER): Payer: Medicare HMO | Admitting: Nurse Practitioner

## 2019-10-05 VITALS — BP 120/73 | HR 98 | Temp 98.4°F | Ht 70.0 in | Wt 161.1 lb

## 2019-10-05 DIAGNOSIS — R195 Other fecal abnormalities: Secondary | ICD-10-CM

## 2019-10-05 NOTE — Patient Instructions (Signed)
1.   Complete the provided lab test   2. Call our office if you see black stool or red blood from the rectum   3. Further follow up to be determined after lab results received

## 2019-10-07 LAB — CBC WITH DIFFERENTIAL/PLATELET
Absolute Monocytes: 602 cells/uL (ref 200–950)
Basophils Absolute: 32 cells/uL (ref 0–200)
Basophils Relative: 0.5 %
Eosinophils Absolute: 109 cells/uL (ref 15–500)
Eosinophils Relative: 1.7 %
HCT: 36.5 % — ABNORMAL LOW (ref 38.5–50.0)
Hemoglobin: 12 g/dL — ABNORMAL LOW (ref 13.2–17.1)
Lymphs Abs: 1363 cells/uL (ref 850–3900)
MCH: 28.8 pg (ref 27.0–33.0)
MCHC: 32.9 g/dL (ref 32.0–36.0)
MCV: 87.5 fL (ref 80.0–100.0)
MPV: 12.1 fL (ref 7.5–12.5)
Monocytes Relative: 9.4 %
Neutro Abs: 4294 cells/uL (ref 1500–7800)
Neutrophils Relative %: 67.1 %
Platelets: 195 10*3/uL (ref 140–400)
RBC: 4.17 10*6/uL — ABNORMAL LOW (ref 4.20–5.80)
RDW: 13.2 % (ref 11.0–15.0)
Total Lymphocyte: 21.3 %
WBC: 6.4 10*3/uL (ref 3.8–10.8)

## 2019-10-11 ENCOUNTER — Telehealth (INDEPENDENT_AMBULATORY_CARE_PROVIDER_SITE_OTHER): Payer: Self-pay | Admitting: Nurse Practitioner

## 2019-10-11 NOTE — Telephone Encounter (Signed)
Patient called for test results  -  Ph# [phone]

## 2019-10-12 ENCOUNTER — Telehealth (INDEPENDENT_AMBULATORY_CARE_PROVIDER_SITE_OTHER): Payer: Self-pay | Admitting: *Deleted

## 2019-10-12 NOTE — Telephone Encounter (Signed)
See other phone note

## 2019-10-12 NOTE — Telephone Encounter (Signed)
Jeffery Ponce called the office to followup on his lab work, Specifically , HGB. I did tell the patient his HGB result but he was also advised that the NP who ordered his test would be back in the office tomorrow, 10/13/2019, and she would call to review all test results with him.  I did ask if he was still seeing blood in stools. He said yes but he feels that it is because she is on Iron.

## 2019-10-13 ENCOUNTER — Other Ambulatory Visit (INDEPENDENT_AMBULATORY_CARE_PROVIDER_SITE_OTHER): Payer: Self-pay | Admitting: *Deleted

## 2019-10-13 ENCOUNTER — Telehealth: Payer: Self-pay | Admitting: Nurse Practitioner

## 2019-10-13 ENCOUNTER — Other Ambulatory Visit: Payer: Self-pay | Admitting: Nurse Practitioner

## 2019-10-13 NOTE — Telephone Encounter (Signed)
I called pt and discussed his insurance requires preauthorization prior to scheduling a small bowel capsule endoscopy therefore Jeffery Ponce will not call him tomorrow as we previously discussed.  I advised the patient Jeffery Ponce will call him after preauth received.  I advised patient to call me if he is not heard back from our office in 1 week.

## 2019-10-13 NOTE — Telephone Encounter (Signed)
I called pt, he is aware Lelon Frohlich will call him next week to schedule the [facility] capsule endoscopy as his insurance requires pre auth.

## 2019-10-13 NOTE — Telephone Encounter (Signed)
GIV[facility] sch'd 10/21/19 at [phone] ([phone]), patient aware

## 2019-10-13 NOTE — Telephone Encounter (Signed)
Ann, Please call the patient to schedule a [facility] small bowel capsule endoscopy.  He prefers if you could call him tomorrow 11/5 between 830 and 9 AM as he works outside all day.  Discussed the capsule endoscopy benefits and risks, refer to lab notes and sidebar.  Refer to last office visit.  Small bowel capsule endoscopy also recommended by Dr. Dorien Chihuahua. thx

## 2019-10-13 NOTE — Telephone Encounter (Signed)
See other phone note today, lab notes

## 2019-10-14 ENCOUNTER — Encounter (INDEPENDENT_AMBULATORY_CARE_PROVIDER_SITE_OTHER): Payer: Self-pay | Admitting: *Deleted

## 2019-10-19 ENCOUNTER — Other Ambulatory Visit (INDEPENDENT_AMBULATORY_CARE_PROVIDER_SITE_OTHER): Payer: Self-pay | Admitting: *Deleted

## 2019-10-29 ENCOUNTER — Ambulatory Visit (HOSPITAL_COMMUNITY)
Admission: RE | Admit: 2019-10-29 | Discharge: 2019-10-29 | Disposition: A | Discharge status: Home / Self Care | Payer: Medicare HMO | Attending: Internal Medicine | Admitting: Internal Medicine

## 2019-10-29 ENCOUNTER — Encounter (HOSPITAL_COMMUNITY)
Admission: RE | Disposition: A | Discharge status: Home / Self Care | Payer: Self-pay | Source: Home / Self Care | Attending: Internal Medicine

## 2019-10-29 ENCOUNTER — Encounter (HOSPITAL_COMMUNITY): Payer: Self-pay | Admitting: *Deleted

## 2019-10-29 DIAGNOSIS — R195 Other fecal abnormalities: Secondary | ICD-10-CM | POA: Insufficient documentation

## 2019-10-29 DIAGNOSIS — Z7982 Long term (current) use of aspirin: Secondary | ICD-10-CM | POA: Diagnosis not present

## 2019-10-29 DIAGNOSIS — Z8551 Personal history of malignant neoplasm of bladder: Secondary | ICD-10-CM | POA: Insufficient documentation

## 2019-10-29 DIAGNOSIS — M1990 Unspecified osteoarthritis, unspecified site: Secondary | ICD-10-CM | POA: Diagnosis not present

## 2019-10-29 DIAGNOSIS — N183 Chronic kidney disease, stage 3 unspecified: Secondary | ICD-10-CM | POA: Insufficient documentation

## 2019-10-29 DIAGNOSIS — Z79899 Other long term (current) drug therapy: Secondary | ICD-10-CM | POA: Insufficient documentation

## 2019-10-29 DIAGNOSIS — D509 Iron deficiency anemia, unspecified: Secondary | ICD-10-CM | POA: Insufficient documentation

## 2019-10-29 DIAGNOSIS — Z87891 Personal history of nicotine dependence: Secondary | ICD-10-CM | POA: Diagnosis not present

## 2019-10-29 DIAGNOSIS — E7800 Pure hypercholesterolemia, unspecified: Secondary | ICD-10-CM | POA: Diagnosis not present

## 2019-10-29 HISTORY — PX: GIVENS CAPSULE STUDY: SHX5432

## 2019-10-29 SURGERY — IMAGING PROCEDURE, GI TRACT, INTRALUMINAL, VIA CAPSULE

## 2019-10-29 MED ORDER — SODIUM CHLORIDE 0.9 % IV SOLN: INTRAVENOUS | Status: DC

## 2019-10-29 NOTE — H&P (Signed)
Jeffery Ponce is an 73 y.o. male.   Chief Complaint: Patient is here for small bowel given capsule study. HPI: Patient is 73 year old Caucasian male who has iron deficiency anemia and heme positive stool.  Since had EGD and colonoscopy last year we recommended evaluation of small bowel with given capsule study.  This reason patient is here today.  Past Medical History:  Diagnosis Date  . Arthritis   . Chronic renal disease, stage 3, moderately decreased glomerular filtration rate (GFR) between 30-59 mL/min/1.73 square meter 11/28/2017  . Heart murmur   . History of kidney stones   . Hypercholesteremia   . Urothelial carcinoma ([place]) 12/10/2016  . 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 BIOPSY  11/13/2018   Procedure: BIOPSY;  Surgeon: Danie Binder, MD;  [facility];  Service: Endoscopy;;  gastric bx's  . CARDIAC CATHETERIZATION    . COLONOSCOPY WITH PROPOFOL N/A 11/14/2018   Procedure: COLONOSCOPY WITH PROPOFOL;  Surgeon: Danie Binder, MD;  [facility]: [facility];  Service: Endoscopy;  Laterality: N/A;  . CYSTOSCOPY WITH INJECTION N/A 07/30/2017   Procedure: CYSTOSCOPY WITH INJECTION OF INDOCYANINE GREEN DYE;  Surgeon: Alexis Frock, MD;  Location: [facility];  Service: Urology;  Laterality: N/A;  . ESOPHAGOGASTRODUODENOSCOPY (EGD) WITH PROPOFOL N/A 11/13/2018   Procedure: ESOPHAGOGASTRODUODENOSCOPY (EGD) WITH PROPOFOL;  Surgeon: Danie Binder, MD;  [facility];  Service: Endoscopy;  Laterality: N/A;  . FEMUR FRACTURE SURGERY Right   . LOWER EXTREMITY ANGIOGRAPHY Right 10/19/2018   Procedure: LOWER EXTREMITY ANGIOGRAPHY;  Surgeon: Algernon Huxley, MD;  Location: [facility];  Service: Cardiovascular;  Laterality: Right;  . MCT 3D RECONSTRUCTION ([facility] HX) Left    arm  . open reduction and internal fixation leg Right    hip and leg.  Marland Kitchen POLYPECTOMY  11/14/2018   Procedure: POLYPECTOMY;  Surgeon:  Danie Binder, MD;  [facility];  Service: Endoscopy;;  . PORT-A-CATH REMOVAL N/A 07/20/2018   Procedure: REMOVAL PORT-A-CATH;  Surgeon: Aviva Signs, MD;  Location: [facility];  Service: General;  Laterality: N/A;  . PORTACATH PLACEMENT N/A 12/23/2016   Procedure: PLACEMENT OF TUNNELED CENTRAL VENOUS CATHETER RIGHT INTERNAL JUGULAR WITH SUBCUTANEOUS PORT;  Surgeon: Vickie Epley, MD;  Location: [facility];  Service: Vascular;  Laterality: N/A;  . reattatchment of left arm     from [place]  . TRANSURETHRAL RESECTION OF BLADDER TUMOR N/A 11/19/2016   Procedure: TRANSURETHRAL RESECTION OF BLADDER TUMOR (TURBT) WITH EPIRUBICIN INJECTION;  Surgeon: Franchot Gallo, MD;  Location: [facility];  Service: Urology;  Laterality: N/A;    Family History  Problem Relation Age of Onset  . Aneurysm Mother   . Diabetes Father   . Colon cancer Brother    Social History:  reports that he quit smoking about a year ago. His smoking use included cigarettes. He has a 25.00 pack-year smoking history. He has never used smokeless tobacco. He reports that he does not drink alcohol or use drugs.  Allergies:  Allergies  Allergen Reactions  . Penicillins Anaphylaxis    Has patient had a PCN reaction causing immediate rash, facial/tongue/throat swelling, SOB or lightheadedness with hypotension:Yes Has patient had a PCN reaction causing severe rash involving mucus membranes or skin necrosis:Yes Has patient had a PCN reaction that required hospitalization:No Has patient had a PCN reaction occurring within the last 10 years:No If all of the above answers  are "NO", then may proceed with Cephalosporin use.   . Ciprofloxacin Itching    Itching at IV site. No hives or shortness of breathe.  . Ambien [Zolpidem Tartrate] Other (See Comments)    "Sleep walking"    Medications Prior to Admission  Medication Sig Dispense Refill  . cholecalciferol (VITAMIN D) 25 MCG (1000 UT) tablet Take 1,000 Units by mouth every  morning.   4  . FERREX 150 150 MG capsule     . GNP ASPIRIN LOW DOSE 81 MG EC tablet TAKE 1 TABLET BY MOUTH ONCE DAILY. 30 tablet 11  . Oxycodone HCl 10 MG TABS Take 1-2 tablets (10-20 mg total) by mouth every 4 (four) hours as needed (pain). Post-operatively (Patient taking differently: Take 10 mg by mouth every 3 (three) hours as needed (pain). Post-operatively) 40 tablet 0  . sodium bicarbonate 650 MG tablet Take 650 mg by mouth 2 (two) times daily.    . Vitamin D, Ergocalciferol, (DRISDOL) 1.25 MG (50000 UT) CAPS capsule TAKE 1 TABLET BY MOUTH ONCE A MONTH    . atorvastatin (LIPITOR) 10 MG tablet Take 1 tablet (10 mg total) by mouth daily. (Patient taking differently: Take 10 mg by mouth every morning. ) 30 tablet 11  . polyethylene glycol (MIRALAX) packet Take 17 g by mouth daily. (Patient not taking: Reported on 08/04/2019) 28 each 1    No results found for this or any previous visit (from the past 48 hour(s)). No results found.  ROS  There were no vitals taken for this visit. Physical Exam   Assessment/Plan  Iron deficiency anemia and heme positive stool. Small bowel given capsule study looking for source of GI blood loss and complete the work-up.  Hildred Laser, MD 10/29/2019, 9:32 AM

## 2019-11-01 ENCOUNTER — Encounter (HOSPITAL_COMMUNITY): Payer: Self-pay | Admitting: Internal Medicine

## 2019-11-03 ENCOUNTER — Other Ambulatory Visit (INDEPENDENT_AMBULATORY_CARE_PROVIDER_SITE_OTHER): Payer: Self-pay | Admitting: Nurse Practitioner

## 2019-11-03 ENCOUNTER — Telehealth (INDEPENDENT_AMBULATORY_CARE_PROVIDER_SITE_OTHER): Payer: Self-pay | Admitting: Nurse Practitioner

## 2019-11-03 DIAGNOSIS — R1084 Generalized abdominal pain: Secondary | ICD-10-CM

## 2019-11-03 NOTE — Telephone Encounter (Signed)
Spoke to patient's wife, advised to go to [facility] radiology and he can go anytime, no appt needed

## 2019-11-03 NOTE — Telephone Encounter (Signed)
Jeffery Ponce, I called pt, I spoke to his wife, patient needs to have abd xray done to assess for a retained Givens capsule. I entered the xray order. Pls contact pt or wife with instructions of where to go at [facility]. thx

## 2019-11-03 NOTE — Telephone Encounter (Signed)
Please call patient regarding the givens capsule he swallowed - stated he has not passed it yet - ph# [phone]

## 2019-11-08 ENCOUNTER — Other Ambulatory Visit: Payer: Self-pay

## 2019-11-08 ENCOUNTER — Ambulatory Visit (HOSPITAL_COMMUNITY)
Admission: RE | Admit: 2019-11-08 | Discharge: 2019-11-08 | Disposition: A | Discharge status: Home / Self Care | Payer: Medicare HMO | Source: Ambulatory Visit | Attending: Nurse Practitioner | Admitting: Nurse Practitioner

## 2019-11-08 DIAGNOSIS — R1084 Generalized abdominal pain: Secondary | ICD-10-CM | POA: Insufficient documentation

## 2019-11-09 DIAGNOSIS — R195 Other fecal abnormalities: Secondary | ICD-10-CM

## 2019-11-09 DIAGNOSIS — Z9889 Other specified postprocedural states: Secondary | ICD-10-CM

## 2019-11-09 DIAGNOSIS — D500 Iron deficiency anemia secondary to blood loss (chronic): Secondary | ICD-10-CM

## 2019-11-09 DIAGNOSIS — K2970 Gastritis, unspecified, without bleeding: Secondary | ICD-10-CM

## 2019-11-09 NOTE — Op Note (Signed)
Small Bowel Givens Capsule Study Procedure date: 10/29/2019 for  Referring Provider: [facility] PCP:  Dr. Thera Flake [facility]  Indication for procedure:    Patient is 73 year old Caucasian male with iron deficiency anemia and heme positive stool underwent EGD and colonoscopy in December 2019 and no bleeding lesion was identified.  Recent evaluation revealed heme positive stool and low hemoglobin of 12 g.  He is on low-dose aspirin.  He is undergoing small bowel given capsule study as he could be losing blood from small intestine.  Findings:  Patient was able to swallow given capsule without any difficulty. Study duration is 8 hours 12 minutes and 41 seconds. Study however is incomplete as capsule stayed in the stomach with with few images of duodenal bulb.  Few specks of coffee-ground material noted in the stomach along with food debris.  Gastric mucosa at antrum revealed granularity erythema and edema. 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 Limited view of bulbar mucosa revealed no abnormality.      First Gastric image: 2 minutes and 7 seconds First Duodenal image: 3 hours 18 minutes and 58 seconds First Ileo-Cecal Valve image: Capsule did not reach cecum First Cecal image: Capsule did not reach colon Gastric Passage time: Gastric passage time cannot be calculated as given capsule never left the stomach Small Bowel Passage time: Not applicable  Summary & Recommendations:  Study is incomplete as Given capsule remained in the stomach with few images of duodenal bulb.  Bulbar mucosa was normal. Study reveals gastritis with few specks of coffee-ground specks and streaks in a single arteriovenous malformation without bleeding. Suspect underlying gastroparesis.  Findings reviewed with patient over the phone. Patient advised to use enteric-coated aspirin. We will schedule patient for esophagogastroduodenoscopy with endoscopic  placement of given capsule into second part of duodenum.  Please note study could not be reviewed earlier as I was out of town until today.

## 2019-11-11 ENCOUNTER — Ambulatory Visit (HOSPITAL_COMMUNITY)
Admission: RE | Admit: 2019-11-11 | Discharge: 2019-11-11 | Disposition: A | Discharge status: Home / Self Care | Payer: Medicare HMO | Source: Ambulatory Visit | Attending: Nurse Practitioner | Admitting: Nurse Practitioner

## 2019-11-11 ENCOUNTER — Other Ambulatory Visit (INDEPENDENT_AMBULATORY_CARE_PROVIDER_SITE_OTHER): Payer: Self-pay | Admitting: Nurse Practitioner

## 2019-11-11 ENCOUNTER — Other Ambulatory Visit: Payer: Self-pay

## 2019-11-11 DIAGNOSIS — R1084 Generalized abdominal pain: Secondary | ICD-10-CM | POA: Diagnosis present

## 2019-11-18 ENCOUNTER — Telehealth (INDEPENDENT_AMBULATORY_CARE_PROVIDER_SITE_OTHER): Payer: Self-pay | Admitting: Nurse Practitioner

## 2019-11-18 ENCOUNTER — Other Ambulatory Visit (INDEPENDENT_AMBULATORY_CARE_PROVIDER_SITE_OTHER): Payer: Self-pay | Admitting: Vascular Surgery

## 2019-11-18 NOTE — Telephone Encounter (Signed)
Patient called stated he passed the capsule has no blood and is ready to proceed with procedure - please advise - ph# [phone]

## 2019-11-18 NOTE — Telephone Encounter (Signed)
Ann pls contact Dr. Laural Golden to verify when he can endoscopically place the Givens capsule on Jeffery Ponce as the prior capsule he swallowed was incomplete, it stayed in the stomach too long. Pt stated he has passed the capsule. thx

## 2019-11-18 NOTE — Telephone Encounter (Signed)
Please advise, thanks.

## 2019-11-25 ENCOUNTER — Encounter (INDEPENDENT_AMBULATORY_CARE_PROVIDER_SITE_OTHER): Payer: Self-pay | Admitting: *Deleted

## 2019-11-25 ENCOUNTER — Other Ambulatory Visit (INDEPENDENT_AMBULATORY_CARE_PROVIDER_SITE_OTHER): Payer: Self-pay | Admitting: Vascular Surgery

## 2019-11-25 ENCOUNTER — Other Ambulatory Visit (INDEPENDENT_AMBULATORY_CARE_PROVIDER_SITE_OTHER): Payer: Self-pay | Admitting: Nurse Practitioner

## 2019-11-25 ENCOUNTER — Other Ambulatory Visit (INDEPENDENT_AMBULATORY_CARE_PROVIDER_SITE_OTHER): Payer: Self-pay | Admitting: *Deleted

## 2019-11-25 DIAGNOSIS — D508 Other iron deficiency anemias: Secondary | ICD-10-CM

## 2019-11-26 ENCOUNTER — Other Ambulatory Visit (HOSPITAL_COMMUNITY): Payer: Self-pay | Admitting: Hematology

## 2019-11-26 ENCOUNTER — Other Ambulatory Visit: Payer: Self-pay | Admitting: Hematology

## 2019-11-26 ENCOUNTER — Other Ambulatory Visit (HOSPITAL_COMMUNITY): Payer: Self-pay | Admitting: Emergency Medicine

## 2019-11-26 DIAGNOSIS — C679 Malignant neoplasm of bladder, unspecified: Secondary | ICD-10-CM

## 2019-11-26 DIAGNOSIS — D649 Anemia, unspecified: Secondary | ICD-10-CM | POA: Insufficient documentation

## 2019-11-28 NOTE — Telephone Encounter (Signed)
Last study was incomplete as capsule never left the stomach. Therefore capsule will have to be placed endoscopically.

## 2019-11-29 ENCOUNTER — Inpatient Hospital Stay (HOSPITAL_COMMUNITY): Payer: Medicare HMO | Attending: Hematology

## 2019-11-29 ENCOUNTER — Ambulatory Visit (HOSPITAL_COMMUNITY): Payer: Medicare HMO

## 2019-11-29 NOTE — Telephone Encounter (Signed)
EGD with GIVENS placement sch'd 12/22/19, patient aware

## 2019-11-30 ENCOUNTER — Telehealth (INDEPENDENT_AMBULATORY_CARE_PROVIDER_SITE_OTHER): Payer: Self-pay | Admitting: *Deleted

## 2019-11-30 NOTE — Telephone Encounter (Signed)
Please call patient - he wants to discuss EGD with GIVENS placement

## 2019-12-01 ENCOUNTER — Ambulatory Visit (HOSPITAL_COMMUNITY): Payer: Medicare HMO | Admitting: Hematology

## 2019-12-01 NOTE — Telephone Encounter (Signed)
I called patient and discussed endoscopy w/ capsule placement given capsule did not pass appropriately on initial test. All questions answered. R/b/A reviewed. Pt to call w/ any further questions.

## 2019-12-14 ENCOUNTER — Ambulatory Visit (HOSPITAL_COMMUNITY): Payer: Medicare HMO | Admitting: Hematology

## 2019-12-17 ENCOUNTER — Other Ambulatory Visit: Payer: Self-pay

## 2019-12-17 ENCOUNTER — Inpatient Hospital Stay (HOSPITAL_COMMUNITY): Payer: Medicare HMO | Attending: Hematology

## 2019-12-17 ENCOUNTER — Ambulatory Visit (HOSPITAL_COMMUNITY)
Admission: RE | Admit: 2019-12-17 | Discharge: 2019-12-17 | Disposition: A | Discharge status: Home / Self Care | Payer: Medicare HMO | Source: Ambulatory Visit | Attending: Hematology | Admitting: Hematology

## 2019-12-17 DIAGNOSIS — C673 Malignant neoplasm of anterior wall of bladder: Secondary | ICD-10-CM | POA: Insufficient documentation

## 2019-12-17 DIAGNOSIS — N183 Chronic kidney disease, stage 3 unspecified: Secondary | ICD-10-CM | POA: Insufficient documentation

## 2019-12-17 DIAGNOSIS — Z888 Allergy status to other drugs, medicaments and biological substances status: Secondary | ICD-10-CM | POA: Insufficient documentation

## 2019-12-17 DIAGNOSIS — E611 Iron deficiency: Secondary | ICD-10-CM | POA: Diagnosis not present

## 2019-12-17 DIAGNOSIS — I700 Atherosclerosis of aorta: Secondary | ICD-10-CM | POA: Insufficient documentation

## 2019-12-17 DIAGNOSIS — J439 Emphysema, unspecified: Secondary | ICD-10-CM | POA: Diagnosis not present

## 2019-12-17 DIAGNOSIS — Z800 Family history of malignant neoplasm of digestive organs: Secondary | ICD-10-CM | POA: Diagnosis not present

## 2019-12-17 DIAGNOSIS — D631 Anemia in chronic kidney disease: Secondary | ICD-10-CM | POA: Diagnosis not present

## 2019-12-17 DIAGNOSIS — Z9221 Personal history of antineoplastic chemotherapy: Secondary | ICD-10-CM | POA: Diagnosis not present

## 2019-12-17 DIAGNOSIS — Z87891 Personal history of nicotine dependence: Secondary | ICD-10-CM | POA: Insufficient documentation

## 2019-12-17 DIAGNOSIS — Z87442 Personal history of urinary calculi: Secondary | ICD-10-CM | POA: Diagnosis not present

## 2019-12-17 DIAGNOSIS — R918 Other nonspecific abnormal finding of lung field: Secondary | ICD-10-CM | POA: Insufficient documentation

## 2019-12-17 DIAGNOSIS — M25551 Pain in right hip: Secondary | ICD-10-CM | POA: Insufficient documentation

## 2019-12-17 DIAGNOSIS — Z885 Allergy status to narcotic agent status: Secondary | ICD-10-CM | POA: Diagnosis not present

## 2019-12-17 DIAGNOSIS — G8929 Other chronic pain: Secondary | ICD-10-CM | POA: Insufficient documentation

## 2019-12-17 DIAGNOSIS — M1990 Unspecified osteoarthritis, unspecified site: Secondary | ICD-10-CM | POA: Diagnosis not present

## 2019-12-17 DIAGNOSIS — Z8249 Family history of ischemic heart disease and other diseases of the circulatory system: Secondary | ICD-10-CM | POA: Insufficient documentation

## 2019-12-17 DIAGNOSIS — C679 Malignant neoplasm of bladder, unspecified: Secondary | ICD-10-CM

## 2019-12-17 DIAGNOSIS — Z79899 Other long term (current) drug therapy: Secondary | ICD-10-CM | POA: Diagnosis not present

## 2019-12-17 DIAGNOSIS — Z833 Family history of diabetes mellitus: Secondary | ICD-10-CM | POA: Diagnosis not present

## 2019-12-17 LAB — CBC WITH DIFFERENTIAL/PLATELET
Abs Immature Granulocytes: 0.04 10*3/uL (ref 0.00–0.07)
Basophils Absolute: 0 10*3/uL (ref 0.0–0.1)
Basophils Relative: 0 %
Eosinophils Absolute: 0.3 10*3/uL (ref 0.0–0.5)
Eosinophils Relative: 3 %
HCT: 42.5 % (ref 39.0–52.0)
Hemoglobin: 12.8 g/dL — ABNORMAL LOW (ref 13.0–17.0)
Immature Granulocytes: 0 %
Lymphocytes Relative: 21 %
Lymphs Abs: 1.9 10*3/uL (ref 0.7–4.0)
MCH: 28.5 pg (ref 26.0–34.0)
MCHC: 30.1 g/dL (ref 30.0–36.0)
MCV: 94.7 fL (ref 80.0–100.0)
Monocytes Absolute: 0.8 10*3/uL (ref 0.1–1.0)
Monocytes Relative: 8 %
Neutro Abs: 6.1 10*3/uL (ref 1.7–7.7)
Neutrophils Relative %: 68 %
Platelets: 220 10*3/uL (ref 150–400)
RBC: 4.49 MIL/uL (ref 4.22–5.81)
RDW: 13.2 % (ref 11.5–15.5)
WBC: 9.1 10*3/uL (ref 4.0–10.5)
nRBC: 0 % (ref 0.0–0.2)

## 2019-12-17 LAB — COMPREHENSIVE METABOLIC PANEL
ALT: 10 U/L (ref 0–44)
AST: 16 U/L (ref 15–41)
Albumin: 4 g/dL (ref 3.5–5.0)
Alkaline Phosphatase: 105 U/L (ref 38–126)
Anion gap: 7 (ref 5–15)
BUN: 30 mg/dL — ABNORMAL HIGH (ref 8–23)
CO2: 25 mmol/L (ref 22–32)
Calcium: 9.2 mg/dL (ref 8.9–10.3)
Chloride: 105 mmol/L (ref 98–111)
Creatinine, Ser: 1.68 mg/dL — ABNORMAL HIGH (ref 0.61–1.24)
GFR calc Af Amer: 46 mL/min — ABNORMAL LOW (ref >60)
GFR calc non Af Amer: 39 mL/min — ABNORMAL LOW (ref >60)
Glucose, Bld: 100 mg/dL — ABNORMAL HIGH (ref 70–99)
Potassium: 4.7 mmol/L (ref 3.5–5.1)
Sodium: 137 mmol/L (ref 135–145)
Total Bilirubin: 0.6 mg/dL (ref 0.3–1.2)
Total Protein: 7.6 g/dL (ref 6.5–8.1)

## 2019-12-17 LAB — IRON AND TIBC
Iron: 46 ug/dL (ref 45–182)
Saturation Ratios: 13 % — ABNORMAL LOW (ref 17.9–39.5)
TIBC: 366 ug/dL (ref 250–450)
UIBC: 320 ug/dL

## 2019-12-17 LAB — FERRITIN: Ferritin: 20 ng/mL — ABNORMAL LOW (ref 24–336)

## 2019-12-17 LAB — VITAMIN B12: Vitamin B-12: 233 pg/mL (ref 180–914)

## 2019-12-17 LAB — FOLATE: Folate: 10.1 ng/mL (ref >5.9)

## 2019-12-17 MED ORDER — IOHEXOL 300 MG/ML  SOLN
80.0000 mL | Freq: Once | INTRAMUSCULAR | Status: AC | PRN
  Administered 2019-12-17: 80 mL via INTRAVENOUS

## 2019-12-20 ENCOUNTER — Other Ambulatory Visit (HOSPITAL_COMMUNITY)
Admission: RE | Admit: 2019-12-20 | Discharge: 2019-12-20 | Disposition: A | Discharge status: Home / Self Care | Payer: Medicare HMO | Source: Ambulatory Visit | Attending: Internal Medicine | Admitting: Internal Medicine

## 2019-12-20 ENCOUNTER — Other Ambulatory Visit: Payer: Self-pay

## 2019-12-21 ENCOUNTER — Other Ambulatory Visit (INDEPENDENT_AMBULATORY_CARE_PROVIDER_SITE_OTHER): Payer: Self-pay | Admitting: *Deleted

## 2019-12-21 ENCOUNTER — Inpatient Hospital Stay (HOSPITAL_BASED_OUTPATIENT_CLINIC_OR_DEPARTMENT_OTHER): Payer: Medicare HMO | Admitting: Hematology

## 2019-12-21 ENCOUNTER — Encounter (HOSPITAL_COMMUNITY): Payer: Self-pay | Admitting: *Deleted

## 2019-12-21 ENCOUNTER — Other Ambulatory Visit (HOSPITAL_COMMUNITY)
Admission: RE | Admit: 2019-12-21 | Discharge: 2019-12-21 | Disposition: A | Discharge status: Home / Self Care | Payer: Medicare HMO | Source: Ambulatory Visit | Attending: Internal Medicine | Admitting: Internal Medicine

## 2019-12-21 ENCOUNTER — Encounter (HOSPITAL_COMMUNITY): Payer: Self-pay | Admitting: Hematology

## 2019-12-21 VITALS — BP 131/70 | HR 87 | Temp 98.2°F | Resp 18 | Wt 158.4 lb

## 2019-12-21 DIAGNOSIS — D500 Iron deficiency anemia secondary to blood loss (chronic): Secondary | ICD-10-CM | POA: Insufficient documentation

## 2019-12-21 DIAGNOSIS — D509 Iron deficiency anemia, unspecified: Secondary | ICD-10-CM | POA: Insufficient documentation

## 2019-12-21 DIAGNOSIS — Z20822 Contact with and (suspected) exposure to covid-19: Secondary | ICD-10-CM | POA: Diagnosis not present

## 2019-12-21 DIAGNOSIS — C673 Malignant neoplasm of anterior wall of bladder: Secondary | ICD-10-CM | POA: Diagnosis not present

## 2019-12-21 DIAGNOSIS — D508 Other iron deficiency anemias: Secondary | ICD-10-CM

## 2019-12-21 DIAGNOSIS — Z01812 Encounter for preprocedural laboratory examination: Secondary | ICD-10-CM | POA: Diagnosis present

## 2019-12-21 DIAGNOSIS — C679 Malignant neoplasm of bladder, unspecified: Secondary | ICD-10-CM

## 2019-12-21 LAB — SARS CORONAVIRUS 2 (TAT 6-24 HRS): SARS Coronavirus 2: NEGATIVE

## 2019-12-21 MED ORDER — CYANOCOBALAMIN 1000 MCG/ML IJ SOLN
1000.0000 ug | Freq: Once | INTRAMUSCULAR | Status: AC
  Administered 2019-12-21: 1000 ug via INTRAMUSCULAR
  Filled 2019-12-21: qty 1

## 2019-12-21 NOTE — Progress Notes (Signed)
[place], [place] [address]   CLINIC:  Medical Oncology/Hematology  PCP:  [facility] [address] [place] [address] [phone]   REASON FOR VISIT:  Follow-up for bladder cancer.  CURRENT THERAPY: Surveillance.  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 Correlate with tissue sampling on cystoscopy.  No findings suspicious for upper tract disease.  Cholelithiasis, without associated inflammatory changes.   11/19/2016 Procedure   TURBT by [name] of a 2 centimeter anterior bladder wall tumor, placement of epirubicin intravesical   11/19/2016 Procedure   50 milligrams of epirubicin and 50 mL of diluent.  This was left indwelling for 1 hour by [name]   11/22/2016 Pathology Results   Bladder, transurethral resection, bladder tumor INFILTRATING HIGH GRADE UROTHELIAL CARCINOMA THE CARCINOMA INVADES MUSCULARIS PROPRIA (DETRUSOR MUSCLE) Microscopic Comment The neoplasm stains positive for high molecular weight cytokeratin, p63 and negative for prostein. The immunostain pattern supports the diagnosis of urothelial carcinoma.   12/18/2016 Imaging   Bilateral interstitial prominence. Active pneumonitis cannot be excluded. These changes could be also be related chronic interstitial lung disease.   12/30/2016 - 04/28/2017 Chemotherapy   The patient had palonosetron (ALOXI) injection 0.25 mg, 0.25 mg, Intravenous,  Once, 4 of 4 cycles  CISplatin (PLATINOL) 138 mg in sodium chloride 0.9 % 500 mL chemo infusion, 70 mg/m2 = 138 mg, Intravenous,  Once, 4 of 4 cycles  gemcitabine (GEMZAR) 1,976 mg in sodium chloride 0.9 % 250 mL chemo infusion, 1,000 mg/m2 = 1,976 mg, Intravenous,   Once, 4 of 4 cycles Dose modification: 800 mg/m2 (80 % of original dose 1,000 mg/m2, Cycle 3, Reason: Provider Judgment, Comment: Thrombocytopenia)  fosaprepitant (EMEND) 150 mg, dexamethasone (DECADRON) 12 mg in sodium chloride 0.9 % 145 mL IVPB, , Intravenous,  Once, 4 of 4 cycles  for chemotherapy treatment.     02/17/2017 Treatment Plan Change   Treatment deferred x 7 days due to thrombocytopenia   02/17/2017 Treatment Plan Change   Gemcitabine dose reduced by 20% due to thrombocytopenia   03/10/2017 Treatment Plan Change   Cisplatin dose-reduced by 20%   04/28/2017 Treatment Plan Change   Day 15 of cycle #4 is cancelled due to thrombocytopenia (68,000).   05/02/2017 Imaging   CT abd/pelvis at [facility]-no evidence of metastatic disease or recurrent focal urothelial lesion on noncontrast imaging.  There is mild diffuse bladder wall thickening.  Urothelial evaluation limited without contrast.  Cholelithiasis, sigmoid diverticulosis, aortic atherosclerosis.   07/30/2017 Definitive Surgery   Cystoprostatectomy by [name] with curative intent.   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 Several mildly enlarged lymph nodes are present including a 1.2 cm peripancreatic lymph node and a 1.4 cm low-density left external iliac lymph node or postoperative fluid collection. These merit surveillance. 2. A right middle lobe subpleural nodule along the minor fissure as a volume of 100 cubic mm. This level has not been included in prior cross-sectional imaging to assess for stability. Guidelines for follow up do not specifically apply due to history of malignancy. Well likely a benign subpleural lymph node, surveillance is likely warranted. 3. Other imaging findings of potential clinical significance: Aortic Atherosclerosis (ICD10-I70.0) and Emphysema (ICD10-J43.9). Coronary atherosclerosis with mitral and aortic valve calcification. Borderline appearance for distal esophageal wall thickening, query reflux. Several small left lower lobe nodules in the 3-4 mm range are probably inflammatory. Cholelithiasis. Cystoprostatectomy with loop ileostomy. Sigmoid diverticulosis. Notable common and external iliac stenosis bilaterally due to atherosclerosis. Lumbar impingement at L2- 3, L3-4, and L4-5.      CANCER STAGING: Cancer Staging Malignant papillary carcinoma of bladder [facility]) Staging form: Urinary Bladder, AJCC 8th Edition - Clinical stage from 12/05/2016: Stage IIIA (cT3, cN0, cM0) - Signed by [name], [name] on 12/18/2016 - Pathologic stage from 12/10/2016: pT2, pN0, cM0 - Signed by [name], [name] on 12/10/2016 - Pathologic stage from 07/30/2017: Stage II (ypT2b, pN0, cM0) - Signed by [name], NP on 09/09/2017    INTERVAL HISTORY:  Mr. Shed 74 y.o. male seen for follow-up of anemia and bladder cancer.  Denies any hematuria.  Denies any bleeding per rectum or melena.  Reportedly had capsule study end of  November.  Has been taking iron tablet daily.  Denies any new onset pains.  Chronic right hip pain rated as 4 out of 10.  Appetite is 75%.  Energy levels are 50%.  Denies any recent hospitalizations.    REVIEW OF SYSTEMS:  Review of Systems  All other systems reviewed and are negative.    PAST MEDICAL/SURGICAL HISTORY:  Past Medical History:  Diagnosis Date  . Arthritis   . Chronic renal disease, stage 3, moderately decreased glomerular filtration rate (GFR) between 30-59 mL/min/1.73 square meter 11/28/2017  . Heart murmur   . History of kidney stones   . Hypercholesteremia   . Urothelial carcinoma ([place]) 12/10/2016  . 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 BIOPSY  11/13/2018   Procedure: BIOPSY;  Surgeon: Danie Binder, MD;  [facility];  Service: Endoscopy;;  gastric bx's  . CARDIAC CATHETERIZATION    . COLONOSCOPY WITH PROPOFOL N/A 11/14/2018   Procedure: COLONOSCOPY WITH PROPOFOL;  Surgeon: Danie Binder, MD;  [facility];  Service: Endoscopy;  Laterality: N/A;  . CYSTOSCOPY WITH INJECTION N/A 07/30/2017   Procedure: CYSTOSCOPY WITH INJECTION OF INDOCYANINE GREEN DYE;  Surgeon: Alexis Frock, MD;  Location: [facility];  Service: Urology;  Laterality: N/A;  . ESOPHAGOGASTRODUODENOSCOPY (EGD) WITH PROPOFOL N/A 11/13/2018   Procedure: ESOPHAGOGASTRODUODENOSCOPY (EGD) WITH PROPOFOL;  Surgeon: Danie Binder, MD;  [facility];  Service: Endoscopy;  Laterality: N/A;  . FEMUR FRACTURE SURGERY Right   . GIVENS CAPSULE STUDY N/A 10/29/2019   Procedure: GIVENS CAPSULE STUDY;  Surgeon: Rogene Houston, MD;  [facility];  Service: Endoscopy;  Laterality: N/A;  730am  . LOWER EXTREMITY ANGIOGRAPHY Right 10/19/2018   Procedure: LOWER EXTREMITY ANGIOGRAPHY;  Surgeon: Algernon Huxley, MD;  Location: [facility];  Service: Cardiovascular;  Laterality: Right;  . MCT 3D  RECONSTRUCTION ([facility] HX) Left    arm  . open reduction and internal fixation leg Right    hip and leg.  Marland Kitchen POLYPECTOMY  11/14/2018   Procedure: POLYPECTOMY;  Surgeon: Danie Binder, MD;  [facility];  Service: Endoscopy;;  . PORT-A-CATH REMOVAL N/A 07/20/2018   Procedure: REMOVAL PORT-A-CATH;  Surgeon: Aviva Signs, MD;  Location: [facility];  Service: General;  Laterality: N/A;  . PORTACATH PLACEMENT N/A 12/23/2016   Procedure: PLACEMENT OF TUNNELED CENTRAL VENOUS CATHETER RIGHT INTERNAL JUGULAR WITH SUBCUTANEOUS PORT;  Surgeon: Vickie Epley, MD;  Location: [facility];  Service: Vascular;  Laterality: N/A;  . reattatchment of left arm     from [place]  . TRANSURETHRAL RESECTION OF BLADDER TUMOR N/A 11/19/2016   Procedure: TRANSURETHRAL RESECTION OF BLADDER TUMOR (TURBT) WITH EPIRUBICIN INJECTION;  Surgeon: Franchot Gallo, MD;  Location: [facility];  Service: Urology;  Laterality: N/A;     SOCIAL HISTORY:  Social History   Socioeconomic History  . Marital status: Married    Spouse name: Not on file  . Number of children: Not on file  . Years of education: Not on file  . Highest education level: Not on file  Occupational History  . Not on file  Tobacco Use  . Smoking status: Former Smoker    Packs/day: 0.50    Years: 50.00    Pack years: 25.00    Types: Cigarettes    Quit date: 10/30/2018    Years since quitting: 1.1  . Smokeless tobacco: Never Used  Substance and Sexual Activity  . Alcohol use: No  . Drug use: No  . Sexual activity: Never    Birth control/protection: None    Comment: married-30 years-2 children by first wife  Other Topics Concern  . Not on file  Social History Narrative  . Not on file   Social Determinants of Health   Financial Resource Strain:   . Difficulty of Paying Living Expenses: Not on file  Food Insecurity:   . Worried About Charity fundraiser in the Last Year: Not on file  . Ran Out of Food in the Last Year: Not on file   Transportation Needs:   . Lack of Transportation (Medical): Not on file  . Lack of Transportation (Non-Medical): Not on file  Physical Activity:   . Days of Exercise per Week: Not on file  . Minutes of Exercise per Session: Not on file  Stress:   . Feeling of Stress : Not on file  Social Connections:   . Frequency of Communication with Friends and Family: Not on file  . Frequency of Social Gatherings with Friends and Family: Not on file  . Attends Religious Services: Not on file  . Active Member of Clubs or Organizations: Not on file  . Attends Archivist Meetings: Not on file  . Marital Status: Not on file  Intimate Partner Violence:   . Fear of Current or Ex-Partner: Not on file  . Emotionally Abused: Not on file  . Physically Abused: Not on file  . Sexually Abused: Not on file    FAMILY HISTORY:  Family History  Problem Relation Age of Onset  . Aneurysm Mother   . Diabetes Father   . Colon  cancer Brother     CURRENT MEDICATIONS:  Outpatient Encounter Medications as of 12/21/2019  Medication Sig Note  . atorvastatin (LIPITOR) 10 MG tablet Take 1 tablet (10 mg total) by mouth daily.   . cholecalciferol (VITAMIN D) 25 MCG (1000 UT) tablet Take 1,000 Units by mouth daily.    Marland Kitchen FERREX 150 150 MG capsule Take 150 mg by mouth daily.    . GNP ASPIRIN LOW DOSE 81 MG EC tablet TAKE 1 TABLET BY MOUTH ONCE DAILY. (Patient taking differently: Take 81 mg by mouth daily. )   . lidocaine-prilocaine (EMLA) cream lidocaine-prilocaine 2.5 %-2.5 % topical cream   . omeprazole (PRILOSEC) 20 MG capsule omeprazole 20 mg capsule,delayed release   . sodium bicarbonate 650 MG tablet Take 650 mg by mouth 2 (two) times daily.   Marland Kitchen albuterol (VENTOLIN HFA) 108 (90 Base) MCG/ACT inhaler Inhale 1-2 puffs into the lungs every 6 (six) hours as needed.   Marland Kitchen morphine (MS CONTIN) 15 MG 12 hr tablet Take 15 mg by mouth 2 (two) times daily as needed for pain.   . Oxycodone HCl 10 MG TABS Take 1-2  tablets (10-20 mg total) by mouth every 4 (four) hours as needed (pain). Post-operatively (Patient not taking: Reported on 12/21/2019)   . polyethylene glycol (MIRALAX) packet Take 17 g by mouth daily. (Patient not taking: Reported on 12/21/2019) 11/27/2018: PRN  . [EXPIRED] cyanocobalamin ((VITAMIN B-12)) injection 1,000 mcg     No facility-administered encounter medications on file as of 12/21/2019.    ALLERGIES:  Allergies  Allergen Reactions  . Penicillins Anaphylaxis    Has patient had a PCN reaction causing immediate rash, facial/tongue/throat swelling, SOB or lightheadedness with hypotension:Yes Has patient had a PCN reaction causing severe rash involving mucus membranes or skin necrosis:Yes Has patient had a PCN reaction that required hospitalization:No Has patient had a PCN reaction occurring within the last 10 years:No If all of the above answers are "NO", then may proceed with Cephalosporin use.   . Ciprofloxacin Itching    Itching at IV site. No hives or shortness of breathe.  . Morphine   . Ambien [Zolpidem Tartrate] Other (See Comments)    "Sleep walking"     PHYSICAL EXAM:  ECOG Performance status: 1  Vitals:   12/21/19 1123  BP: 131/70  Pulse: 87  Resp: 18  Temp: 98.2 F (36.8 C)  SpO2: 98%   Filed Weights   12/21/19 1123  Weight: 158 lb 6.4 oz (71.8 kg)    Physical Exam Vitals reviewed.  Constitutional:      Appearance: Normal appearance. He is well-developed.  Cardiovascular:     Rate and Rhythm: Normal rate and regular rhythm.     Heart sounds: Normal heart sounds.  Pulmonary:     Effort: Pulmonary effort is normal.     Breath sounds: Normal breath sounds.  Abdominal:     General: There is no distension.     Palpations: Abdomen is soft. There is no mass.  Skin:    General: Skin is warm.     Findings: No rash.  Neurological:     General: No focal deficit present.     Mental Status: He is alert and oriented to person, place, and time.   Psychiatric:        Mood and Affect: Mood normal.        Behavior: Behavior normal.      LABORATORY DATA:  I have reviewed the labs as listed.  CBC  Component Value Date/Time   WBC 9.1 12/17/2019 1058   RBC 4.49 12/17/2019 1058   HGB 12.8 (L) 12/17/2019 1058   HCT 42.5 12/17/2019 1058   PLT 220 12/17/2019 1058   MCV 94.7 12/17/2019 1058   MCH 28.5 12/17/2019 1058   MCHC 30.1 12/17/2019 1058   RDW 13.2 12/17/2019 1058   LYMPHSABS 1.9 12/17/2019 1058   MONOABS 0.8 12/17/2019 1058   EOSABS 0.3 12/17/2019 1058   BASOSABS 0.0 12/17/2019 1058   CMP Latest Ref Rng & Units 12/17/2019 08/02/2019 04/26/2019  Glucose 70 - 99 mg/dL 100(H) 101(H) 115(H)  BUN 8 - 23 mg/dL 30(H) 27(H) 30(H)  Creatinine 0.61 - 1.24 mg/dL 1.68(H) 1.69(H) 1.81(H)  Sodium 135 - 145 mmol/L 137 137 139  Potassium 3.5 - 5.1 mmol/L 4.7 4.8 4.0  Chloride 98 - 111 mmol/L 105 108 110  CO2 22 - 32 mmol/L 25 21(L) 19(L)  Calcium 8.9 - 10.3 mg/dL 9.2 9.0 8.8(L)  Total Protein 6.5 - 8.1 g/dL 7.6 7.5 7.3  Total Bilirubin 0.3 - 1.2 mg/dL 0.6 0.6 0.7  Alkaline Phos 38 - 126 U/L 105 104 107  AST 15 - 41 U/L 16 15 11(L)  ALT 0 - 44 U/L 10 10 9        DIAGNOSTIC IMAGING:  I have independently reviewed images of this latest CT scans and reviewed them with the patient.     ASSESSMENT & PLAN:   Bladder cancer ([place]) 1.  Normocytic anemia: -Combination anemia from CKD and iron deficiency. -He is taking iron tablet daily. -Denies any bleeding per rectum or melena. -Capsule study on 10/29/2019 could not be completed as it was stuck in the stomach. -We reviewed labs today.  Hemoglobin is 12.8.  However ferritin is 20 with percent saturation of 13. -His creatinine is stable around 1.6.  B12 was borderline at 233.  Folic acid was normal. -He will be given B12 injection today.  He was told to take B12 tablet daily. -Based on his low iron levels, I have recommended Feraheme weekly x2.  We discussed about side effects in  detail. -He is reportedly scheduled for another capsule study tomorrow. -I will see him back in 6 months with repeat labs.  2.  Bladder cancer: -Underwent TURBT for a T2N0 urothelial bladder cancer on 11/19/2016. -As there was suspicion for muscle invasion, underwent cisplatin and gemcitabine 4 cycles from 12/30/2016 through 04/21/2017 followed by cystoprostatectomy on 07/30/2017, showing residual disease of 0.4 cm. -He denies any hematuria.  No fevers or night sweats.  No new onset pains. -CT of the chest on 08/02/2019 showed waxing and waning small bilateral pulmonary nodules. -I have reviewed CT scan from 12/17/2019.  Did not show any evidence of recurrence or metastasis.  Pulmonary nodules are stable.  New left pelvocaliectasis. -I plan to repeat CT scan in 6 months.  Total time spent is 30 minutes with more than 50% of the time spent face-to-face discussing scan results, treatment options.    Orders placed this encounter:  Orders Placed This Encounter  Procedures  . CBC with Differential/Platelet  . Comprehensive metabolic panel  . Iron and TIBC  . Ferritin  . Vitamin B12  . Folate      Derek Jack, MD [place] [phone]

## 2019-12-21 NOTE — Assessment & Plan Note (Signed)
1.  Normocytic anemia: -Combination anemia from CKD and iron deficiency. -He is taking iron tablet daily. -Denies any bleeding per rectum or melena. -Capsule study on 10/29/2019 could not be completed as it was stuck in the stomach. -We reviewed labs today.  Hemoglobin is 12.8.  However ferritin is 20 with percent saturation of 13. -His creatinine is stable around 1.6.  B12 was borderline at 233.  Folic acid was normal. -He will be given B12 injection today.  He was told to take B12 tablet daily. -Based on his low iron levels, I have recommended Feraheme weekly x2.  We discussed about side effects in detail. -He is reportedly scheduled for another capsule study tomorrow. -I will see him back in 6 months with repeat labs.  2.  Bladder cancer: -Underwent TURBT for a T2N0 urothelial bladder cancer on 11/19/2016. -As there was suspicion for muscle invasion, underwent cisplatin and gemcitabine 4 cycles from 12/30/2016 through 04/21/2017 followed by cystoprostatectomy on 07/30/2017, showing residual disease of 0.4 cm. -He denies any hematuria.  No fevers or night sweats.  No new onset pains. -CT of the chest on 08/02/2019 showed waxing and waning small bilateral pulmonary nodules. -I have reviewed CT scan from 12/17/2019.  Did not show any evidence of recurrence or metastasis.  Pulmonary nodules are stable.  New left pelvocaliectasis. -I plan to repeat CT scan in 6 months.

## 2019-12-21 NOTE — Patient Instructions (Addendum)
[place] at [facility] Discharge Instructions  You were seen today by Dr. Delton Coombes. He went over your recent lab results. He will schedule you for 2 weekly IV iron. He would like you to start taking B12 1mg  daily. He will give you a B12 injection today. He will see you back in 6 months for labs and follow up.   Thank you for choosing [place] at [facility] to provide your oncology and hematology care.  To afford each patient quality time with our provider, please arrive at least 15 minutes before your scheduled appointment time.   If you have a lab appointment with the [facility] please come in thru the  Main Entrance and check in at the main information desk  You need to re-schedule your appointment should you arrive 10 or more minutes late.  We strive to give you quality time with our providers, and arriving late affects you and other patients whose appointments are after yours.  Also, if you no show three or more times for appointments you may be dismissed from the clinic at the providers discretion.     Again, thank you for choosing [facility].  Our hope is that these requests will decrease the amount of time that you wait before being seen by our physicians.       _____________________________________________________________  Should you have questions after your visit to [facility], please contact our office at [phone] between the hours of 8:00 a.m. and 4:30 p.m.  Voicemails left after 4:00 p.m. will not be returned until the following business day.  For prescription refill requests, have your pharmacy contact our office and allow 72 hours.    Cancer Center Support Programs:   > Cancer Support Group  2nd Tuesday of the month 1pm-2pm, Journey Room

## 2019-12-21 NOTE — Progress Notes (Signed)
Patient tolerated injection with no complaints voiced.  Site clean and dry with no bruising or swelling noted at site.  Band aid applied.  Vss with discharge and left ambulatory with no s/s of distress noted.  

## 2019-12-22 ENCOUNTER — Encounter (HOSPITAL_COMMUNITY): Payer: Self-pay | Admitting: Internal Medicine

## 2019-12-22 ENCOUNTER — Other Ambulatory Visit: Payer: Self-pay

## 2019-12-22 ENCOUNTER — Ambulatory Visit (HOSPITAL_COMMUNITY)
Admission: RE | Admit: 2019-12-22 | Discharge: 2019-12-22 | Disposition: A | Discharge status: Home / Self Care | Payer: Medicare HMO | Attending: Internal Medicine | Admitting: Internal Medicine

## 2019-12-22 ENCOUNTER — Encounter (HOSPITAL_COMMUNITY)
Admission: RE | Disposition: A | Discharge status: Home / Self Care | Payer: Self-pay | Source: Home / Self Care | Attending: Internal Medicine

## 2019-12-22 DIAGNOSIS — K922 Gastrointestinal hemorrhage, unspecified: Secondary | ICD-10-CM | POA: Diagnosis not present

## 2019-12-22 DIAGNOSIS — Z79899 Other long term (current) drug therapy: Secondary | ICD-10-CM | POA: Insufficient documentation

## 2019-12-22 DIAGNOSIS — K2950 Unspecified chronic gastritis without bleeding: Secondary | ICD-10-CM | POA: Diagnosis not present

## 2019-12-22 DIAGNOSIS — M1990 Unspecified osteoarthritis, unspecified site: Secondary | ICD-10-CM | POA: Insufficient documentation

## 2019-12-22 DIAGNOSIS — E7800 Pure hypercholesterolemia, unspecified: Secondary | ICD-10-CM | POA: Diagnosis not present

## 2019-12-22 DIAGNOSIS — Z87891 Personal history of nicotine dependence: Secondary | ICD-10-CM | POA: Insufficient documentation

## 2019-12-22 DIAGNOSIS — R195 Other fecal abnormalities: Secondary | ICD-10-CM

## 2019-12-22 DIAGNOSIS — D500 Iron deficiency anemia secondary to blood loss (chronic): Secondary | ICD-10-CM | POA: Diagnosis present

## 2019-12-22 DIAGNOSIS — Z8551 Personal history of malignant neoplasm of bladder: Secondary | ICD-10-CM | POA: Insufficient documentation

## 2019-12-22 DIAGNOSIS — N183 Chronic kidney disease, stage 3 unspecified: Secondary | ICD-10-CM | POA: Diagnosis not present

## 2019-12-22 DIAGNOSIS — D649 Anemia, unspecified: Secondary | ICD-10-CM | POA: Insufficient documentation

## 2019-12-22 DIAGNOSIS — D508 Other iron deficiency anemias: Secondary | ICD-10-CM

## 2019-12-22 DIAGNOSIS — K3189 Other diseases of stomach and duodenum: Secondary | ICD-10-CM

## 2019-12-22 HISTORY — PX: GIVENS CAPSULE STUDY: SHX5432

## 2019-12-22 HISTORY — PX: ESOPHAGOGASTRODUODENOSCOPY: SHX5428

## 2019-12-22 HISTORY — PX: BIOPSY: SHX5522

## 2019-12-22 SURGERY — EGD (ESOPHAGOGASTRODUODENOSCOPY)
Anesthesia: Moderate Sedation

## 2019-12-22 MED ORDER — LIDOCAINE VISCOUS HCL 2 % MT SOLN
OROMUCOSAL | Status: DC | PRN
  Administered 2019-12-22: 4 mL via OROMUCOSAL

## 2019-12-22 MED ORDER — SODIUM CHLORIDE 0.9 % IV SOLN: INTRAVENOUS | Status: DC

## 2019-12-22 MED ORDER — LIDOCAINE VISCOUS HCL 2 % MT SOLN
OROMUCOSAL | Status: AC
  Filled 2019-12-22: qty 15

## 2019-12-22 MED ORDER — CHLORHEXIDINE GLUCONATE CLOTH 2 % EX PADS: 6.0000 | MEDICATED_PAD | Freq: Once | CUTANEOUS | Status: DC

## 2019-12-22 MED ORDER — MEPERIDINE HCL 50 MG/ML IJ SOLN
INTRAMUSCULAR | Status: AC
  Filled 2019-12-22: qty 1

## 2019-12-22 MED ORDER — MIDAZOLAM HCL 5 MG/5ML IJ SOLN
INTRAMUSCULAR | Status: DC | PRN
  Administered 2019-12-22 (×2): 1 mg via INTRAVENOUS
  Administered 2019-12-22 (×3): 2 mg via INTRAVENOUS

## 2019-12-22 MED ORDER — MIDAZOLAM HCL 5 MG/5ML IJ SOLN
INTRAMUSCULAR | Status: AC
  Filled 2019-12-22: qty 10

## 2019-12-22 MED ORDER — MEPERIDINE HCL 50 MG/ML IJ SOLN
INTRAMUSCULAR | Status: DC | PRN
  Administered 2019-12-22 (×2): 25 mg via INTRAVENOUS

## 2019-12-22 MED ORDER — STERILE WATER FOR IRRIGATION IR SOLN
Status: DC | PRN
  Administered 2019-12-22: 13:00:00 1.5 mL

## 2019-12-22 NOTE — Discharge Instructions (Signed)
No aspirin or NSAIDs for 24 hours. Consider taking coated baby aspirin instead of uncoated aspirin. Resume other medications as before. Resume usual diet. No driving for 24 hours. Physician will call the results of biopsy and given capsule study when completed.       Upper Endoscopy, Adult, Care After This sheet gives you information about how to care for yourself after your procedure. Your health care provider may also give you more specific instructions. If you have problems or questions, contact your health care provider. What can I expect after the procedure? After the procedure, it is common to have:  A sore throat.  Mild stomach pain or discomfort.  Bloating.  Nausea. Follow these instructions at home:   Follow instructions from your health care provider about what to eat or drink after your procedure.  Return to your normal activities as told by your health care provider. Ask your health care provider what activities are safe for you.  Take over-the-counter and prescription medicines only as told by your health care provider.  Do not drive for 24 hours if you were given a sedative during your procedure.  Keep all follow-up visits as told by your health care provider. This is important. Contact a health care provider if you have:  A sore throat that lasts longer than one day.  Trouble swallowing. Get help right away if:  You vomit blood or your vomit looks like coffee grounds.  You have: ? A fever. ? Bloody, black, or tarry stools. ? A severe sore throat or you cannot swallow. ? Difficulty breathing. ? Severe pain in your chest or abdomen. Summary  After the procedure, it is common to have a sore throat, mild stomach discomfort, bloating, and nausea.  Do not drive for 24 hours if you were given a sedative during the procedure.  Follow instructions from your health care provider about what to eat or drink after your procedure.  Return to your normal  activities as told by your health care provider. This information is not intended to replace advice given to you by your health care provider. Make sure you discuss any questions you have with your health care provider. Document Revised: 05/19/2018 Document Reviewed: 04/27/2018 Elsevier Patient Education  [place].

## 2019-12-22 NOTE — H&P (Signed)
Jeffery Ponce is an 74 y.o. male.   Chief Complaint: Patient is here for esophagogastroduodenoscopy and endoscopic placement of given capsule into duodenum HPI: Patient is 74 year old Caucasian male who was seen in the office on 10/05/2019 for iron deficiency anemia and heme positive stool.  He previously had been evaluated in December 2019 with EGD and colonoscopy and no eating lesion was entered 5.  He underwent small bowel given capsule study on 10/29/2019.  Capsule remained the stomach.  Duration of study.  Specks of coffee-ground material and food debris.  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  It appears capsule peaked in the duodenum and then fell back to the stomach.  He has no complaints.  He denies nausea vomiting abdominal pain melena or rectal bleeding.  He states his hemoglobin is coming up he is scheduled to receive iron infusion later this week. His hemoglobin 5 days ago was 12.8 g He is on low-dose aspirin which is on hold.  Past Medical History:  Diagnosis Date  . Arthritis   . Chronic renal disease, stage 3, moderately decreased glomerular filtration rate (GFR) between 30-59 mL/min/1.73 square meter 11/28/2017  . Heart murmur   . History of kidney stones   . Hypercholesteremia   . Urothelial carcinoma ([place]) 12/10/2016  . 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 BIOPSY  11/13/2018   Procedure: BIOPSY;  Surgeon: Danie Binder, MD;  [facility];  Service: Endoscopy;;  gastric bx's  . CARDIAC CATHETERIZATION    . COLONOSCOPY WITH PROPOFOL N/A 11/14/2018   Procedure: COLONOSCOPY WITH PROPOFOL;  Surgeon: Danie Binder, MD;  [facility];  Service: Endoscopy;  Laterality: N/A;  . CYSTOSCOPY WITH INJECTION N/A 07/30/2017   Procedure: CYSTOSCOPY WITH INJECTION OF INDOCYANINE GREEN DYE;  Surgeon: Alexis Frock, MD;  Location: [facility];  Service: Urology;  Laterality: N/A;  .  ESOPHAGOGASTRODUODENOSCOPY (EGD) WITH PROPOFOL N/A 11/13/2018   Procedure: ESOPHAGOGASTRODUODENOSCOPY (EGD) WITH PROPOFOL;  Surgeon: Danie Binder, MD;  [facility];  Service: Endoscopy;  Laterality: N/A;  . FEMUR FRACTURE SURGERY Right   . GIVENS CAPSULE STUDY N/A 10/29/2019   Procedure: GIVENS CAPSULE STUDY;  Surgeon: Rogene Houston, MD;  [facility];  Service: Endoscopy;  Laterality: N/A;  730am  . LOWER EXTREMITY ANGIOGRAPHY Right 10/19/2018   Procedure: LOWER EXTREMITY ANGIOGRAPHY;  Surgeon: Algernon Huxley, MD;  Location: [facility];  Service: Cardiovascular;  Laterality: Right;  . MCT 3D RECONSTRUCTION ([facility] HX) Left    arm  . open reduction and internal fixation leg Right    hip and leg.  Marland Kitchen POLYPECTOMY  11/14/2018   Procedure: POLYPECTOMY;  Surgeon: Danie Binder, MD;  [facility];  Service: Endoscopy;;  . PORT-A-CATH REMOVAL N/A 07/20/2018   Procedure: REMOVAL PORT-A-CATH;  Surgeon: Aviva Signs, MD;  Location: [facility];  Service: General;  Laterality: N/A;  . PORTACATH PLACEMENT N/A 12/23/2016   Procedure: PLACEMENT OF TUNNELED CENTRAL VENOUS CATHETER RIGHT INTERNAL JUGULAR WITH SUBCUTANEOUS PORT;  Surgeon: Vickie Epley, MD;  Location: [facility];  Service: Vascular;  Laterality: N/A;  . reattatchment of left arm     from [place]  . TRANSURETHRAL RESECTION OF BLADDER TUMOR N/A 11/19/2016   Procedure: TRANSURETHRAL RESECTION OF BLADDER TUMOR (TURBT) WITH EPIRUBICIN INJECTION;  Surgeon: Franchot Gallo, MD;  Location: [facility];  Service: Urology;  Laterality: N/A;  Family History  Problem Relation Age of Onset  . Aneurysm Mother   . Diabetes Father   . Colon cancer Brother    Social History:  reports that he quit smoking about 13 months ago. His smoking use included cigarettes. He has a 25.00 pack-year smoking history. He has never used smokeless tobacco. He reports that he does not drink alcohol or use drugs.  Allergies:   Allergies  Allergen Reactions  . Penicillins Anaphylaxis    Has patient had a PCN reaction causing immediate rash, facial/tongue/throat swelling, SOB or lightheadedness with hypotension:Yes Has patient had a PCN reaction causing severe rash involving mucus membranes or skin necrosis:Yes Has patient had a PCN reaction that required hospitalization:No Has patient had a PCN reaction occurring within the last 10 years:No If all of the above answers are "NO", then may proceed with Cephalosporin use.   . Ciprofloxacin Itching    Itching at IV site. No hives or shortness of breathe.  . Ambien [Zolpidem Tartrate] Other (See Comments)    "Sleep walking"    Medications Prior to Admission  Medication Sig Dispense Refill  . atorvastatin (LIPITOR) 10 MG tablet Take 1 tablet (10 mg total) by mouth daily. 30 tablet 11  . cholecalciferol (VITAMIN D) 25 MCG (1000 UT) tablet Take 1,000 Units by mouth daily.   4  . GNP ASPIRIN LOW DOSE 81 MG EC tablet TAKE 1 TABLET BY MOUTH ONCE DAILY. (Patient taking differently: Take 81 mg by mouth daily. ) 30 tablet 11  . morphine (MS CONTIN) 15 MG 12 hr tablet Take 15 mg by mouth 2 (two) times daily as needed for pain.    Marland Kitchen omeprazole (PRILOSEC) 20 MG capsule omeprazole 20 mg capsule,delayed release    . Oxycodone HCl 10 MG TABS Take 1-2 tablets (10-20 mg total) by mouth every 4 (four) hours as needed (pain). Post-operatively 40 tablet 0  . polyethylene glycol (MIRALAX) packet Take 17 g by mouth daily. 28 each 1  . sodium bicarbonate 650 MG tablet Take 650 mg by mouth 2 (two) times daily.    Marland Kitchen albuterol (VENTOLIN HFA) 108 (90 Base) MCG/ACT inhaler Inhale 1-2 puffs into the lungs every 6 (six) hours as needed.    Marland Kitchen FERREX 150 150 MG capsule Take 150 mg by mouth daily.     Marland Kitchen lidocaine-prilocaine (EMLA) cream lidocaine-prilocaine 2.5 %-2.5 % topical cream      Results for orders placed or performed during the hospital encounter of 12/21/19 (from the past 48 hour(s))   SARS CORONAVIRUS 2 (TAT 6-24 HRS) Nasopharyngeal Nasopharyngeal Swab     Status: None   Collection Time: 12/21/19 12:21 PM   Specimen: Nasopharyngeal Swab  Result Value Ref Range   SARS Coronavirus 2 NEGATIVE NEGATIVE    Comment: (NOTE) SARS-CoV-2 target nucleic acids are NOT DETECTED. The SARS-CoV-2 RNA is generally detectable in upper and lower respiratory specimens during the acute phase of infection. Negative results do not preclude SARS-CoV-2 infection, do not rule out co-infections with other pathogens, and should not be used as the sole basis for treatment or other patient management decisions. Negative results must be combined with clinical observations, patient history, and epidemiological information. The expected result is Negative. Fact Sheet for Patients: SugarRoll.be Fact Sheet for Healthcare Providers: https://www.woods-mathews.com/ This test is not yet approved or cleared by the [place] FDA and  has been authorized for detection and/or diagnosis of SARS-CoV-2 by FDA under an Emergency Use Authorization (EUA). This EUA will remain  in effect (  meaning this test can be used) for the duration of the COVID-19 declaration under Section 56 4(b)(1) of the Act, 21 U.S.C. section 360bbb-3(b)(1), unless the authorization is terminated or revoked sooner. Performed at [facility], [place] [address] [place], [place] [address]    No results found.  Review of Systems  Blood pressure 139/67, pulse 73, temperature 99 F (37.2 C), temperature source Oral, resp. rate 18, height 5' 10.5" (1.791 m), weight 76.2 kg, SpO2 99 %. Physical Exam  Constitutional: He appears well-developed and well-nourished.  HENT:  Mouth/Throat: Oropharynx is clear and moist.  Eyes: Conjunctivae are normal. No scleral icterus.  Neck: No thyromegaly present.  Cardiovascular: Normal rate, regular rhythm and normal heart sounds.  No murmur  heard. Respiratory: Effort normal and breath sounds normal.  GI: Soft. He exhibits no distension and no mass. There is no abdominal tenderness.  Musculoskeletal:        General: No edema.  Lymphadenopathy:    He has no cervical adenopathy.  Neurological: He is alert.  Skin: Skin is warm and dry.     Assessment/Plan Heme positive stool and iron deficiency anemia. No bleeding lesion identified on EGD and colonoscopy of December 2019. Given capsule study incomplete as capsule stayed in stomach with findings as above. Esophagogastroduodenoscopy with endoscopic placement of given capsule into duodenum.  Hildred Laser, MD 12/22/2019, 1:18 PM

## 2019-12-22 NOTE — Op Note (Signed)
[facility] Patient Name: Jeffery Ponce Procedure Date: 12/22/2019 1:04 PM MRN: [id number] Date of Birth: Jun 19, 1946 Attending MD: Hildred Laser , MD CSN: [id number] Age: 75 Admit Type: Outpatient Procedure:                Upper GI endoscopy Indications:              Iron deficiency anemia secondary to chronic blood                            loss Providers:                Hildred Laser, MD, Otis Peak B. Sharon Seller, RN, Raphael Gibney, Technician Referring MD:             [facility] center. Medicines:                Lidocaine spray, Meperidine 50 mg IV, Midazolam 8                            mg IV Complications:            No immediate complications. Estimated Blood Loss:     Estimated blood loss was minimal. Procedure:                Pre-Anesthesia Assessment:                           - Prior to the procedure, a History and Physical                            was performed, and patient medications and                            allergies were reviewed. The patient's tolerance of                            previous anesthesia was also reviewed. The risks                            and benefits of the procedure and the sedation                            options and risks were discussed with the patient.                            All questions were answered, and informed consent                            was obtained. Prior Anticoagulants: The patient has                            taken no previous anticoagulant or antiplatelet  agents except for aspirin. ASA Grade Assessment:                            III - A patient with severe systemic disease. After                            reviewing the risks and benefits, the patient was                            deemed in satisfactory condition to undergo the                            procedure.                           After obtaining informed consent, the endoscope was                             passed under direct vision. Throughout the                            procedure, the patient's blood pressure, pulse, and                            oxygen saturations were monitored continuously. The                            [id number]-H190 [id number]) scope was introduced through the                            mouth, and advanced to the second part of duodenum.                            The upper GI endoscopy was accomplished without                            difficulty. The patient tolerated the procedure                            well. Scope In: 1:31:19 PM Scope Out: 1:52:35 PM Total Procedure Duration: 0 hours 21 minutes 16 seconds  Findings:      The examined esophagus was normal.      The Z-line was irregular and was found 40 cm from the incisors.      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       Gastric biopsy taken      The exam of the stomach was otherwise normal.      The duodenal bulb and second portion of the duodenum were normal.      Using the endoscope, the video capsule enteroscope was advanced into the       second portion of the duodenum. Impression:               - Normal esophagus.                           -  Z-line irregular, 40 cm from the incisors.                           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 Gastric biopsy taken from antral                            mucosa.                           - Normal duodenal bulb and second portion of the                            duodenum.                           - Successful completion of the Video Capsule                            Enteroscope placement.                           - No specimens collected. Moderate Sedation:      Moderate (conscious) sedation was administered by the endoscopy nurse       and supervised by the endoscopist. The following parameters were       monitored: oxygen saturation,  heart rate, blood pressure, CO2       capnography and response to care. Total physician intraservice time was       28 minutes. Recommendation:           - Patient has a contact number available for                            emergencies. The signs and symptoms of potential                            delayed complications were discussed with the                            patient. Return to normal activities tomorrow.                            Written discharge instructions were provided to the                            patient.                           - Clear liquid diet for 2 hours.                           - Resume previous diet for 4 hours.                           -  No aspirin, ibuprofen, naproxen, or other                            non-steroidal anti-inflammatory drugs for 1 day.                           - Await pathology results. Procedure Code(s):        --- Professional ---                           [phone], Esophagogastroduodenoscopy, flexible,                            transoral; diagnostic, including collection of                            specimen(s) by brushing or washing, when performed                            (separate procedure)                           99153, Moderate sedation; each additional 15                            minutes intraservice time                           G0500, Moderate sedation services provided by the                            same physician or other qualified health care                            professional performing a gastrointestinal                            endoscopic service that sedation supports,                            requiring the presence of an independent trained                            observer to assist in the monitoring of the                            patient's level of consciousness and physiological                            status; initial 15 minutes of intra-service time;                             patient age 40 years or older (additional time may                            be reported with  99153, as appropriate) Diagnosis Code(s):        --- Professional ---                           K22.8, Other specified diseases of esophagus                           D50.0, Iron deficiency anemia secondary to blood                            loss (chronic) CPT copyright 2019 American Medical Association. All rights reserved. The codes documented in this report are preliminary and upon coder review may  be revised to meet current compliance requirements. Hildred Laser, MD Hildred Laser, MD 12/22/2019 2:07:49 PM This report has been signed electronically. Number of Addenda: 0

## 2019-12-23 ENCOUNTER — Other Ambulatory Visit (HOSPITAL_COMMUNITY): Payer: Self-pay | Admitting: *Deleted

## 2019-12-23 DIAGNOSIS — C679 Malignant neoplasm of bladder, unspecified: Secondary | ICD-10-CM

## 2019-12-24 ENCOUNTER — Inpatient Hospital Stay (HOSPITAL_COMMUNITY): Payer: Medicare HMO

## 2019-12-24 ENCOUNTER — Encounter (HOSPITAL_COMMUNITY): Payer: Self-pay

## 2019-12-24 ENCOUNTER — Other Ambulatory Visit: Payer: Self-pay

## 2019-12-24 VITALS — BP 141/52 | HR 61 | Temp 97.3°F | Resp 18

## 2019-12-24 DIAGNOSIS — D649 Anemia, unspecified: Secondary | ICD-10-CM

## 2019-12-24 DIAGNOSIS — D509 Iron deficiency anemia, unspecified: Secondary | ICD-10-CM

## 2019-12-24 DIAGNOSIS — C673 Malignant neoplasm of anterior wall of bladder: Secondary | ICD-10-CM | POA: Diagnosis not present

## 2019-12-24 MED ORDER — SODIUM CHLORIDE 0.9 % IV SOLN: Freq: Once | INTRAVENOUS | Status: AC

## 2019-12-24 MED ORDER — SODIUM CHLORIDE 0.9 % IV SOLN
200.0000 mg | Freq: Once | INTRAVENOUS | Status: AC
  Administered 2019-12-24: 200 mg via INTRAVENOUS
  Filled 2019-12-24: qty 200

## 2019-12-24 NOTE — Patient Instructions (Signed)

## 2019-12-24 NOTE — Progress Notes (Signed)
To treatment room for iron infusion.  Reviewed side effects and when to notify the nurse of any problems.  Written information given for review and home.  All questions asked and answered.  No s/s of distress noted.   Patient tolerated iron infusion with no complaints voiced.  Peripheral IV site clean and dry with good blood return noted before and after infusion.  Band aid applied.  VSS with discharge and left ambulatory with no s/s of distress noted.

## 2019-12-25 NOTE — Op Note (Signed)
Small Bowel Givens Capsule Study Procedure date: 12/22/2019  Referring Provider: [facility] PCP:  [facility]  Indication for procedure:    Patient is 74 year old Caucasian male with iron deficiency anemia secondary to chronic GI bleed.  He had undergone esophagogastroduodenoscopy and colonoscopy back in December 2019 and no bleeding lesion was identified.  He therefore underwent small bowel given capsule study on 10/28/2020 and capsule stayed in the stomach for the duration of the study.  Specks of coffee-ground material were noted in the stomach.  Patient was therefore advised esophagogastroduodenoscopy along with endoscopic placement of given capsule    Findings:   Given capsule was placed into second part of duodenum endoscopically. Study is complete as capsule reached colon. Multiple petechial hemorrhages noted involving gastric mucosa which are well documented on EGD prior to endoscopic placement of capsule. Fresh blood noted on images starting at 00:0:526 through 00:06:49 secondary to trauma as the capsule forced across pylorus. Nonbleeding arteriovenous malformations noted at 00:07:51, 00:19:21 and 00:23:05. Dark black liquid noted in distal small bowel most likely due to iron.   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 Fresh blood noted on initial images felt to be due to trauma resulting from passage of  given capsule across pylorus.  Please note no bleeding noted on prior endoscopy. 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 Would recommend patient take enteric-coated aspirin. Patient to continue iron infusions via oncology clinic with periodic H&H.

## 2019-12-27 LAB — SURGICAL PATHOLOGY

## 2019-12-28 ENCOUNTER — Encounter (HOSPITAL_COMMUNITY): Payer: Self-pay

## 2019-12-28 ENCOUNTER — Inpatient Hospital Stay (HOSPITAL_COMMUNITY): Payer: Medicare HMO

## 2019-12-28 VITALS — BP 116/56 | HR 57 | Temp 96.9°F | Resp 18 | Wt 161.4 lb

## 2019-12-28 DIAGNOSIS — D649 Anemia, unspecified: Secondary | ICD-10-CM

## 2019-12-28 DIAGNOSIS — D509 Iron deficiency anemia, unspecified: Secondary | ICD-10-CM

## 2019-12-28 DIAGNOSIS — C673 Malignant neoplasm of anterior wall of bladder: Secondary | ICD-10-CM | POA: Diagnosis not present

## 2019-12-28 MED ORDER — SODIUM CHLORIDE 0.9 % IV SOLN
200.0000 mg | Freq: Once | INTRAVENOUS | Status: AC
  Administered 2019-12-28: 200 mg via INTRAVENOUS
  Filled 2019-12-28: qty 200

## 2019-12-28 MED ORDER — SODIUM CHLORIDE 0.9 % IV SOLN: Freq: Once | INTRAVENOUS | Status: AC

## 2019-12-28 NOTE — Progress Notes (Signed)
Jeffery Ponce tolerated Venofer infusion well without complaints or incident. Peripheral IV site checked with positive blood return noted prior to and after infusion. VSS upon discharge. Pt discharged self ambulatory in satisfactory condition

## 2019-12-28 NOTE — Patient Instructions (Addendum)
[facility] at [facility] Discharge Instructions  Received Venofer infusion today. Follow-up as scheduled. Call clinic for any questions or concerns   Thank you for choosing [facility] at [facility] to provide your oncology and hematology care.  To afford each patient quality time with our provider, please arrive at least 15 minutes before your scheduled appointment time.   If you have a lab appointment with the Cancer Center please come in thru the Main Entrance and check in at the main information desk.  You need to re-schedule your appointment should you arrive 10 or more minutes late.  We strive to give you quality time with our providers, and arriving late affects you and other patients whose appointments are after yours.  Also, if you no show three or more times for appointments you may be dismissed from the clinic at the providers discretion.     Again, thank you for choosing [facility].  Our hope is that these requests will decrease the amount of time that you wait before being seen by our physicians.       _____________________________________________________________  Should you have questions after your visit to  Cancer Center, please contact our office at [phone] between the hours of 8:00 a.m. and 4:30 p.m.  Voicemails left after 4:00 p.m. will not be returned until the following business day.  For prescription refill requests, have your pharmacy contact our office and allow 72 hours.    Due to Covid, you will need to wear a mask upon entering the hospital. If you do not have a mask, a mask will be given to you at the Main Entrance upon arrival. For doctor visits, patients may have 1 support person with them. For treatment visits, patients can not have anyone with them due to social distancing guidelines and our immunocompromised population.     

## 2019-12-30 ENCOUNTER — Inpatient Hospital Stay (HOSPITAL_COMMUNITY): Payer: Medicare HMO

## 2019-12-30 ENCOUNTER — Other Ambulatory Visit: Payer: Self-pay

## 2019-12-30 ENCOUNTER — Encounter (HOSPITAL_COMMUNITY): Payer: Self-pay

## 2019-12-30 VITALS — BP 134/57 | HR 59 | Temp 97.1°F | Resp 18

## 2019-12-30 DIAGNOSIS — D509 Iron deficiency anemia, unspecified: Secondary | ICD-10-CM

## 2019-12-30 DIAGNOSIS — C673 Malignant neoplasm of anterior wall of bladder: Secondary | ICD-10-CM | POA: Diagnosis not present

## 2019-12-30 DIAGNOSIS — D649 Anemia, unspecified: Secondary | ICD-10-CM

## 2019-12-30 MED ORDER — SODIUM CHLORIDE 0.9 % IV SOLN: Freq: Once | INTRAVENOUS | Status: AC

## 2019-12-30 MED ORDER — SODIUM CHLORIDE 0.9 % IV SOLN
200.0000 mg | Freq: Once | INTRAVENOUS | Status: AC
  Administered 2019-12-30: 200 mg via INTRAVENOUS
  Filled 2019-12-30: qty 10

## 2019-12-30 NOTE — Patient Instructions (Signed)
[facility] at [facility] Discharge Instructions  Received Venofer infusion today. Follow-up as scheduled. Call clinic for any questions or concerns   Thank you for choosing [facility] at [facility] to provide your oncology and hematology care.  To afford each patient quality time with our provider, please arrive at least 15 minutes before your scheduled appointment time.   If you have a lab appointment with the Cancer Center please come in thru the Main Entrance and check in at the main information desk.  You need to re-schedule your appointment should you arrive 10 or more minutes late.  We strive to give you quality time with our providers, and arriving late affects you and other patients whose appointments are after yours.  Also, if you no show three or more times for appointments you may be dismissed from the clinic at the providers discretion.     Again, thank you for choosing [facility].  Our hope is that these requests will decrease the amount of time that you wait before being seen by our physicians.       _____________________________________________________________  Should you have questions after your visit to [facility], please contact our office at [phone] between the hours of 8:00 a.m. and 4:30 p.m.  Voicemails left after 4:00 p.m. will not be returned until the following business day.  For prescription refill requests, have your pharmacy contact our office and allow 72 hours.    Due to Covid, you will need to wear a mask upon entering the hospital. If you do not have a mask, a mask will be given to you at the Main Entrance upon arrival. For doctor visits, patients may have 1 support person with them. For treatment visits, patients can not have anyone with them due to social distancing guidelines and our immunocompromised population.     

## 2019-12-30 NOTE — Progress Notes (Signed)
Johny Shock tolerated Venofer infusion well without complaints or incident. Peripheral IV site checked with positive blood return noted prior to and after infusion. VSS upon discharge. Pt discharged self ambulatory in satisfactory condition

## 2019-12-31 ENCOUNTER — Ambulatory Visit (HOSPITAL_COMMUNITY): Payer: Medicare HMO

## 2020-01-03 ENCOUNTER — Encounter (HOSPITAL_COMMUNITY): Payer: Self-pay

## 2020-01-03 ENCOUNTER — Inpatient Hospital Stay (HOSPITAL_COMMUNITY): Payer: Medicare HMO

## 2020-01-03 ENCOUNTER — Other Ambulatory Visit: Payer: Self-pay

## 2020-01-03 VITALS — BP 131/52 | HR 66 | Temp 97.4°F | Resp 18

## 2020-01-03 DIAGNOSIS — C673 Malignant neoplasm of anterior wall of bladder: Secondary | ICD-10-CM | POA: Diagnosis not present

## 2020-01-03 DIAGNOSIS — D509 Iron deficiency anemia, unspecified: Secondary | ICD-10-CM

## 2020-01-03 DIAGNOSIS — D649 Anemia, unspecified: Secondary | ICD-10-CM

## 2020-01-03 MED ORDER — SODIUM CHLORIDE 0.9 % IV SOLN: Freq: Once | INTRAVENOUS | Status: AC

## 2020-01-03 MED ORDER — SODIUM CHLORIDE 0.9 % IV SOLN
200.0000 mg | Freq: Once | INTRAVENOUS | Status: AC
  Administered 2020-01-03: 200 mg via INTRAVENOUS
  Filled 2020-01-03: qty 200

## 2020-01-03 NOTE — Progress Notes (Signed)
Patient presents today for Venofer infusion. MAR reviewed and updated. No complaints of any pain or changes since his last visit. Vital signs stable.

## 2020-01-03 NOTE — Patient Instructions (Signed)
[facility] at [facility]  Discharge Instructions:   _______________________________________________________________  Thank you for choosing [facility] at [facility] to provide your oncology and hematology care.  To afford each patient quality time with our providers, please arrive at least 15 minutes before your scheduled appointment.  You need to re-schedule your appointment if you arrive 10 or more minutes late.  We strive to give you quality time with our providers, and arriving late affects you and other patients whose appointments are after yours.  Also, if you no show three or more times for appointments you may be dismissed from the clinic.  Again, thank you for choosing [facility] at [facility]. Our hope is that these requests will allow you access to exceptional care and in a timely manner. _______________________________________________________________  If you have questions after your visit, please contact our office at [phone] between the hours of 8:30 a.m. and 5:00 p.m. Voicemails left after 4:30 p.m. will not be returned until the following business day. _______________________________________________________________  For prescription refill requests, have your pharmacy contact our office. _______________________________________________________________  Recommendations made by the consultant and any test results will be sent to your referring physician. _______________________________________________________________ 

## 2020-01-05 ENCOUNTER — Encounter (HOSPITAL_COMMUNITY): Payer: Self-pay

## 2020-01-05 ENCOUNTER — Inpatient Hospital Stay (HOSPITAL_COMMUNITY): Payer: Medicare HMO

## 2020-01-05 ENCOUNTER — Other Ambulatory Visit: Payer: Self-pay

## 2020-01-05 VITALS — BP 131/61 | HR 85 | Temp 97.3°F | Resp 18

## 2020-01-05 DIAGNOSIS — C673 Malignant neoplasm of anterior wall of bladder: Secondary | ICD-10-CM | POA: Diagnosis not present

## 2020-01-05 DIAGNOSIS — D649 Anemia, unspecified: Secondary | ICD-10-CM

## 2020-01-05 DIAGNOSIS — D509 Iron deficiency anemia, unspecified: Secondary | ICD-10-CM

## 2020-01-05 MED ORDER — SODIUM CHLORIDE 0.9 % IV SOLN: Freq: Once | INTRAVENOUS | Status: AC

## 2020-01-05 MED ORDER — SODIUM CHLORIDE 0.9 % IV SOLN
200.0000 mg | Freq: Once | INTRAVENOUS | Status: AC
  Administered 2020-01-05: 14:00:00 200 mg via INTRAVENOUS
  Filled 2020-01-05: qty 200

## 2020-01-05 NOTE — Progress Notes (Signed)
Iron given per orders. Patient tolerated it well without problems. Vitals stable and discharged home from clinic ambulatory. Follow up as scheduled.  

## 2020-06-20 ENCOUNTER — Other Ambulatory Visit: Payer: Self-pay

## 2020-06-20 ENCOUNTER — Ambulatory Visit (HOSPITAL_COMMUNITY)
Admission: RE | Admit: 2020-06-20 | Discharge: 2020-06-20 | Disposition: A | Discharge status: Home / Self Care | Payer: Medicare HMO | Source: Ambulatory Visit | Attending: Hematology | Admitting: Hematology

## 2020-06-20 ENCOUNTER — Inpatient Hospital Stay (HOSPITAL_COMMUNITY): Payer: Medicare HMO | Attending: Hematology

## 2020-06-20 DIAGNOSIS — M542 Cervicalgia: Secondary | ICD-10-CM | POA: Insufficient documentation

## 2020-06-20 DIAGNOSIS — Z8249 Family history of ischemic heart disease and other diseases of the circulatory system: Secondary | ICD-10-CM | POA: Diagnosis not present

## 2020-06-20 DIAGNOSIS — R5383 Other fatigue: Secondary | ICD-10-CM | POA: Diagnosis not present

## 2020-06-20 DIAGNOSIS — Z79899 Other long term (current) drug therapy: Secondary | ICD-10-CM | POA: Insufficient documentation

## 2020-06-20 DIAGNOSIS — K5900 Constipation, unspecified: Secondary | ICD-10-CM | POA: Insufficient documentation

## 2020-06-20 DIAGNOSIS — C679 Malignant neoplasm of bladder, unspecified: Secondary | ICD-10-CM

## 2020-06-20 DIAGNOSIS — I700 Atherosclerosis of aorta: Secondary | ICD-10-CM | POA: Diagnosis not present

## 2020-06-20 DIAGNOSIS — D508 Other iron deficiency anemias: Secondary | ICD-10-CM

## 2020-06-20 DIAGNOSIS — Z87891 Personal history of nicotine dependence: Secondary | ICD-10-CM | POA: Diagnosis not present

## 2020-06-20 DIAGNOSIS — D631 Anemia in chronic kidney disease: Secondary | ICD-10-CM | POA: Insufficient documentation

## 2020-06-20 DIAGNOSIS — N183 Chronic kidney disease, stage 3 unspecified: Secondary | ICD-10-CM | POA: Diagnosis not present

## 2020-06-20 DIAGNOSIS — C673 Malignant neoplasm of anterior wall of bladder: Secondary | ICD-10-CM | POA: Insufficient documentation

## 2020-06-20 DIAGNOSIS — Z833 Family history of diabetes mellitus: Secondary | ICD-10-CM | POA: Diagnosis not present

## 2020-06-20 DIAGNOSIS — E611 Iron deficiency: Secondary | ICD-10-CM | POA: Insufficient documentation

## 2020-06-20 DIAGNOSIS — J439 Emphysema, unspecified: Secondary | ICD-10-CM | POA: Insufficient documentation

## 2020-06-20 DIAGNOSIS — Z800 Family history of malignant neoplasm of digestive organs: Secondary | ICD-10-CM | POA: Diagnosis not present

## 2020-06-20 LAB — CBC WITH DIFFERENTIAL/PLATELET
Abs Immature Granulocytes: 0.02 10*3/uL (ref 0.00–0.07)
Basophils Absolute: 0 10*3/uL (ref 0.0–0.1)
Basophils Relative: 0 %
Eosinophils Absolute: 0.7 10*3/uL — ABNORMAL HIGH (ref 0.0–0.5)
Eosinophils Relative: 9 %
HCT: 39.8 % (ref 39.0–52.0)
Hemoglobin: 12.3 g/dL — ABNORMAL LOW (ref 13.0–17.0)
Immature Granulocytes: 0 %
Lymphocytes Relative: 29 %
Lymphs Abs: 2.3 10*3/uL (ref 0.7–4.0)
MCH: 30.1 pg (ref 26.0–34.0)
MCHC: 30.9 g/dL (ref 30.0–36.0)
MCV: 97.3 fL (ref 80.0–100.0)
Monocytes Absolute: 0.6 10*3/uL (ref 0.1–1.0)
Monocytes Relative: 8 %
Neutro Abs: 4.4 10*3/uL (ref 1.7–7.7)
Neutrophils Relative %: 54 %
Platelets: 210 10*3/uL (ref 150–400)
RBC: 4.09 MIL/uL — ABNORMAL LOW (ref 4.22–5.81)
RDW: 13 % (ref 11.5–15.5)
WBC: 8.1 10*3/uL (ref 4.0–10.5)
nRBC: 0 % (ref 0.0–0.2)

## 2020-06-20 LAB — COMPREHENSIVE METABOLIC PANEL
ALT: 23 U/L (ref 0–44)
AST: 20 U/L (ref 15–41)
Albumin: 3.7 g/dL (ref 3.5–5.0)
Alkaline Phosphatase: 116 U/L (ref 38–126)
Anion gap: 11 (ref 5–15)
BUN: 23 mg/dL (ref 8–23)
CO2: 24 mmol/L (ref 22–32)
Calcium: 8.7 mg/dL — ABNORMAL LOW (ref 8.9–10.3)
Chloride: 104 mmol/L (ref 98–111)
Creatinine, Ser: 2 mg/dL — ABNORMAL HIGH (ref 0.61–1.24)
GFR calc Af Amer: 37 mL/min — ABNORMAL LOW (ref >60)
GFR calc non Af Amer: 32 mL/min — ABNORMAL LOW (ref >60)
Glucose, Bld: 143 mg/dL — ABNORMAL HIGH (ref 70–99)
Potassium: 4.3 mmol/L (ref 3.5–5.1)
Sodium: 139 mmol/L (ref 135–145)
Total Bilirubin: 0.5 mg/dL (ref 0.3–1.2)
Total Protein: 6.9 g/dL (ref 6.5–8.1)

## 2020-06-20 LAB — IRON AND TIBC
Iron: 47 ug/dL (ref 45–182)
Saturation Ratios: 19 % (ref 17.9–39.5)
TIBC: 251 ug/dL (ref 250–450)
UIBC: 204 ug/dL

## 2020-06-20 LAB — FERRITIN: Ferritin: 87 ng/mL (ref 24–336)

## 2020-06-20 LAB — FOLATE: Folate: 14.3 ng/mL (ref >5.9)

## 2020-06-20 LAB — VITAMIN B12: Vitamin B-12: 213 pg/mL (ref 180–914)

## 2020-06-20 MED ORDER — IOHEXOL 300 MG/ML  SOLN
100.0000 mL | Freq: Once | INTRAMUSCULAR | Status: AC | PRN
  Administered 2020-06-20: 75 mL via INTRAVENOUS

## 2020-06-20 MED ORDER — IOHEXOL 300 MG/ML  SOLN: 75.0000 mL | Freq: Once | INTRAMUSCULAR | Status: DC | PRN

## 2020-06-27 ENCOUNTER — Inpatient Hospital Stay (HOSPITAL_BASED_OUTPATIENT_CLINIC_OR_DEPARTMENT_OTHER): Payer: Medicare HMO | Admitting: Hematology

## 2020-06-27 ENCOUNTER — Other Ambulatory Visit: Payer: Self-pay

## 2020-06-27 VITALS — BP 143/73 | HR 82 | Temp 96.9°F | Resp 18 | Wt 158.4 lb

## 2020-06-27 DIAGNOSIS — C679 Malignant neoplasm of bladder, unspecified: Secondary | ICD-10-CM | POA: Diagnosis not present

## 2020-06-27 DIAGNOSIS — C673 Malignant neoplasm of anterior wall of bladder: Secondary | ICD-10-CM | POA: Diagnosis not present

## 2020-06-27 NOTE — Addendum Note (Signed)
Addended by: Farley Ly on: 06/27/2020 03:38 PM   Modules accepted: Orders

## 2020-06-27 NOTE — Progress Notes (Signed)
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 without contrast.   Orders placed this encounter:  No orders of the defined types were placed in this encounter.    Derek Jack, MD [place] [phone]   I, Milinda Antis, am acting as a scribe for Dr. Sanda Linger.  I, Derek Jack MD, have reviewed the above documentation for accuracy and completeness, and I agree with the above.

## 2020-06-27 NOTE — Patient Instructions (Addendum)
[place] at [facility] Discharge Instructions  You were seen today by Dr. Delton Coombes. He went over your recent results and scans. You will be scheduled for a CT scan of your abdomen. Dr. Delton Coombes will see you back in 6 months for labs and follow up.   Thank you for choosing [place] at [facility] to provide your oncology and hematology care.  To afford each patient quality time with our provider, please arrive at least 15 minutes before your scheduled appointment time.   If you have a lab appointment with the [facility] please come in thru the Main Entrance and check in at the main information desk  You need to re-schedule your appointment should you arrive 10 or more minutes late.  We strive to give you quality time with our providers, and arriving late affects you and other patients whose appointments are after yours.  Also, if you no show three or more times for appointments you may be dismissed from the clinic at the providers discretion.     Again, thank you for choosing [facility].  Our hope is that these requests will decrease the amount of time that you wait before being seen by our physicians.       _____________________________________________________________  Should you have questions after your visit to [facility], please contact our office at [phone] between the hours of 8:00 a.m. and 4:30 p.m.  Voicemails left after 4:00 p.m. will not be returned until the following business day.  For prescription refill requests, have your pharmacy contact our office and allow 72 hours.    Cancer Center Support Programs:   > Cancer Support Group  2nd Tuesday of the month 1pm-2pm, Journey Room

## 2020-06-28 NOTE — Addendum Note (Signed)
Addended by: Farley Ly on: 06/28/2020 10:29 AM   Modules accepted: Orders

## 2020-12-21 ENCOUNTER — Other Ambulatory Visit (HOSPITAL_COMMUNITY): Payer: Medicare HMO

## 2020-12-22 ENCOUNTER — Other Ambulatory Visit (HOSPITAL_COMMUNITY): Payer: Medicare HMO

## 2020-12-28 ENCOUNTER — Ambulatory Visit (HOSPITAL_COMMUNITY): Payer: Medicare HMO | Admitting: Hematology

## 2021-01-10 ENCOUNTER — Other Ambulatory Visit: Payer: Self-pay

## 2021-01-10 ENCOUNTER — Ambulatory Visit (HOSPITAL_COMMUNITY)
Admission: RE | Admit: 2021-01-10 | Discharge: 2021-01-10 | Disposition: A | Discharge status: Home / Self Care | Payer: Medicare HMO | Source: Ambulatory Visit | Attending: Hematology | Admitting: Hematology

## 2021-01-10 ENCOUNTER — Inpatient Hospital Stay (HOSPITAL_COMMUNITY): Payer: Medicare HMO | Attending: Hematology

## 2021-01-10 DIAGNOSIS — N189 Chronic kidney disease, unspecified: Secondary | ICD-10-CM | POA: Insufficient documentation

## 2021-01-10 DIAGNOSIS — Z800 Family history of malignant neoplasm of digestive organs: Secondary | ICD-10-CM | POA: Diagnosis not present

## 2021-01-10 DIAGNOSIS — Z8249 Family history of ischemic heart disease and other diseases of the circulatory system: Secondary | ICD-10-CM | POA: Diagnosis not present

## 2021-01-10 DIAGNOSIS — Z87891 Personal history of nicotine dependence: Secondary | ICD-10-CM | POA: Insufficient documentation

## 2021-01-10 DIAGNOSIS — Z833 Family history of diabetes mellitus: Secondary | ICD-10-CM | POA: Diagnosis not present

## 2021-01-10 DIAGNOSIS — C679 Malignant neoplasm of bladder, unspecified: Secondary | ICD-10-CM | POA: Diagnosis not present

## 2021-01-10 DIAGNOSIS — E611 Iron deficiency: Secondary | ICD-10-CM | POA: Diagnosis not present

## 2021-01-10 DIAGNOSIS — F32A Depression, unspecified: Secondary | ICD-10-CM | POA: Diagnosis not present

## 2021-01-10 DIAGNOSIS — M2550 Pain in unspecified joint: Secondary | ICD-10-CM | POA: Diagnosis not present

## 2021-01-10 DIAGNOSIS — I700 Atherosclerosis of aorta: Secondary | ICD-10-CM | POA: Diagnosis not present

## 2021-01-10 DIAGNOSIS — G479 Sleep disorder, unspecified: Secondary | ICD-10-CM | POA: Insufficient documentation

## 2021-01-10 DIAGNOSIS — Z79899 Other long term (current) drug therapy: Secondary | ICD-10-CM | POA: Insufficient documentation

## 2021-01-10 DIAGNOSIS — J439 Emphysema, unspecified: Secondary | ICD-10-CM | POA: Insufficient documentation

## 2021-01-10 DIAGNOSIS — D631 Anemia in chronic kidney disease: Secondary | ICD-10-CM | POA: Diagnosis not present

## 2021-01-10 DIAGNOSIS — C673 Malignant neoplasm of anterior wall of bladder: Secondary | ICD-10-CM | POA: Insufficient documentation

## 2021-01-10 DIAGNOSIS — R5383 Other fatigue: Secondary | ICD-10-CM | POA: Diagnosis not present

## 2021-01-10 LAB — COMPREHENSIVE METABOLIC PANEL
ALT: 26 U/L (ref 0–44)
AST: 21 U/L (ref 15–41)
Albumin: 4.1 g/dL (ref 3.5–5.0)
Alkaline Phosphatase: 118 U/L (ref 38–126)
Anion gap: 8 (ref 5–15)
BUN: 27 mg/dL — ABNORMAL HIGH (ref 8–23)
CO2: 25 mmol/L (ref 22–32)
Calcium: 9.5 mg/dL (ref 8.9–10.3)
Chloride: 106 mmol/L (ref 98–111)
Creatinine, Ser: 1.9 mg/dL — ABNORMAL HIGH (ref 0.61–1.24)
GFR, Estimated: 36 mL/min — ABNORMAL LOW (ref >60)
Glucose, Bld: 97 mg/dL (ref 70–99)
Potassium: 4.2 mmol/L (ref 3.5–5.1)
Sodium: 139 mmol/L (ref 135–145)
Total Bilirubin: 0.4 mg/dL (ref 0.3–1.2)
Total Protein: 7.7 g/dL (ref 6.5–8.1)

## 2021-01-10 LAB — CBC WITH DIFFERENTIAL/PLATELET
Abs Immature Granulocytes: 0.03 10*3/uL (ref 0.00–0.07)
Basophils Absolute: 0.1 10*3/uL (ref 0.0–0.1)
Basophils Relative: 1 %
Eosinophils Absolute: 0.4 10*3/uL (ref 0.0–0.5)
Eosinophils Relative: 4 %
HCT: 43.4 % (ref 39.0–52.0)
Hemoglobin: 13.8 g/dL (ref 13.0–17.0)
Immature Granulocytes: 0 %
Lymphocytes Relative: 25 %
Lymphs Abs: 2.5 10*3/uL (ref 0.7–4.0)
MCH: 29.9 pg (ref 26.0–34.0)
MCHC: 31.8 g/dL (ref 30.0–36.0)
MCV: 94.1 fL (ref 80.0–100.0)
Monocytes Absolute: 0.7 10*3/uL (ref 0.1–1.0)
Monocytes Relative: 7 %
Neutro Abs: 6.5 10*3/uL (ref 1.7–7.7)
Neutrophils Relative %: 63 %
Platelets: 300 10*3/uL (ref 150–400)
RBC: 4.61 MIL/uL (ref 4.22–5.81)
RDW: 13.2 % (ref 11.5–15.5)
WBC: 10.1 10*3/uL (ref 4.0–10.5)
nRBC: 0 % (ref 0.0–0.2)

## 2021-01-10 LAB — IRON AND TIBC
Iron: 48 ug/dL (ref 45–182)
Saturation Ratios: 12 % — ABNORMAL LOW (ref 17.9–39.5)
TIBC: 413 ug/dL (ref 250–450)
UIBC: 365 ug/dL

## 2021-01-10 LAB — FERRITIN: Ferritin: 18 ng/mL — ABNORMAL LOW (ref 24–336)

## 2021-01-10 LAB — FOLATE: Folate: 11.3 ng/mL (ref >5.9)

## 2021-01-10 LAB — VITAMIN B12: Vitamin B-12: 231 pg/mL (ref 180–914)

## 2021-01-10 MED ORDER — IOHEXOL 300 MG/ML  SOLN: 100.0000 mL | Freq: Once | INTRAMUSCULAR | Status: DC | PRN

## 2021-01-10 MED ORDER — IOHEXOL 300 MG/ML  SOLN
75.0000 mL | Freq: Once | INTRAMUSCULAR | Status: AC | PRN
  Administered 2021-01-10: 75 mL via INTRAVENOUS

## 2021-01-15 ENCOUNTER — Other Ambulatory Visit: Payer: Self-pay

## 2021-01-15 ENCOUNTER — Encounter (HOSPITAL_COMMUNITY): Payer: Self-pay | Admitting: Hematology

## 2021-01-15 ENCOUNTER — Inpatient Hospital Stay (HOSPITAL_BASED_OUTPATIENT_CLINIC_OR_DEPARTMENT_OTHER): Payer: Medicare HMO | Admitting: Hematology

## 2021-01-15 VITALS — BP 129/64 | HR 87 | Temp 98.1°F | Resp 16 | Wt 164.8 lb

## 2021-01-15 DIAGNOSIS — C673 Malignant neoplasm of anterior wall of bladder: Secondary | ICD-10-CM | POA: Diagnosis not present

## 2021-01-15 DIAGNOSIS — C679 Malignant neoplasm of bladder, unspecified: Secondary | ICD-10-CM

## 2021-01-15 DIAGNOSIS — D509 Iron deficiency anemia, unspecified: Secondary | ICD-10-CM

## 2021-01-15 NOTE — Patient Instructions (Addendum)
[place] at [facility] Discharge Instructions  You were seen today by Dr. Delton Coombes. He went over your recent results and scans. You will be scheduled to receive several iron infusions to improve your iron stores. You will be scheduled for a CT scan of your abdomen before your next visit. Drink plenty of water daily, 1-2 liters, to keep your kidneys hydrated. Dr. Delton Coombes will see you back in 6 months for labs and follow up.   Thank you for choosing [place] at [facility] to provide your oncology and hematology care.  To afford each patient quality time with our provider, please arrive at least 15 minutes before your scheduled appointment time.   If you have a lab appointment with the [facility] please come in thru the Main Entrance and check in at the main information desk  You need to re-schedule your appointment should you arrive 10 or more minutes late.  We strive to give you quality time with our providers, and arriving late affects you and other patients whose appointments are after yours.  Also, if you no show three or more times for appointments you may be dismissed from the clinic at the providers discretion.     Again, thank you for choosing [facility].  Our hope is that these requests will decrease the amount of time that you wait before being seen by our physicians.       _____________________________________________________________  Should you have questions after your visit to [facility], please contact our office at [phone] between the hours of 8:00 a.m. and 4:30 p.m.  Voicemails left after 4:00 p.m. will not be returned until the following business day.  For prescription refill requests, have your pharmacy contact our office and allow 72 hours.    Cancer Center Support Programs:   > Cancer Support Group  2nd Tuesday of the month 1pm-2pm, Journey Room

## 2021-01-15 NOTE — Progress Notes (Signed)
[place], [place] [address]   CLINIC:  Medical Oncology/Hematology  PCP:  [facility] / [place] [address] [phone]   REASON FOR VISIT:  Follow-up for bladder cancer & normocytic anemia  PRIOR THERAPY:  1. Cisplatin and gemcitabine x 4 cycles from 12/30/2016 to 04/21/2017. 2. Cystoprostatectomy with ileal conduit on 07/30/2017.  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 Correlate with tissue sampling on cystoscopy.  No findings suspicious for upper tract disease.  Cholelithiasis, without associated inflammatory changes.   11/19/2016 Procedure   TURBT by [name] of a 2 centimeter anterior bladder wall tumor, placement of epirubicin intravesical   11/19/2016 Procedure   50 milligrams of epirubicin and 50 mL of diluent.  This was left indwelling for 1 hour by [name]   11/22/2016 Pathology Results   Bladder, transurethral resection, bladder tumor INFILTRATING HIGH GRADE UROTHELIAL CARCINOMA THE CARCINOMA INVADES MUSCULARIS PROPRIA (DETRUSOR MUSCLE) Microscopic Comment The neoplasm stains positive for high molecular weight cytokeratin, p63 and negative for prostein. The immunostain pattern supports the diagnosis of urothelial carcinoma.   12/18/2016 Imaging   Bilateral interstitial prominence. Active pneumonitis cannot be excluded. These changes could be also be related chronic interstitial lung disease.   12/30/2016 - 04/28/2017 Chemotherapy   The patient had palonosetron (ALOXI) injection 0.25 mg, 0.25 mg, Intravenous,  Once, 4 of 4 cycles  CISplatin  (PLATINOL) 138 mg in sodium chloride 0.9 % 500 mL chemo infusion, 70 mg/m2 = 138 mg, Intravenous,  Once, 4 of 4 cycles  gemcitabine (GEMZAR) 1,976 mg in sodium chloride 0.9 % 250 mL chemo infusion, 1,000 mg/m2 = 1,976 mg, Intravenous,  Once, 4 of 4 cycles Dose modification: 800 mg/m2 (80 % of original dose 1,000 mg/m2, Cycle 3, Reason: Provider Judgment, Comment: Thrombocytopenia)  fosaprepitant (EMEND) 150 mg, dexamethasone (DECADRON) 12 mg in sodium chloride 0.9 % 145 mL IVPB, , Intravenous,  Once, 4 of 4 cycles  for chemotherapy treatment.     02/17/2017 Treatment Plan Change   Treatment deferred x 7 days due to thrombocytopenia   02/17/2017 Treatment Plan Change   Gemcitabine dose reduced by 20% due to thrombocytopenia   03/10/2017 Treatment Plan Change   Cisplatin dose-reduced by 20%   04/28/2017 Treatment Plan Change   Day 15 of cycle #4 is cancelled due to thrombocytopenia (68,000).   05/02/2017 Imaging   CT abd/pelvis at [facility]-no evidence of metastatic disease or recurrent focal urothelial lesion on noncontrast imaging.  There is mild diffuse bladder wall thickening.  Urothelial evaluation limited without contrast.  Cholelithiasis, sigmoid diverticulosis, aortic atherosclerosis.   07/30/2017 Definitive Surgery   Cystoprostatectomy by [name] with curative intent.   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 Several mildly enlarged lymph nodes are present including a 1.2 cm peripancreatic lymph node and a 1.4 cm low-density left external iliac lymph node or postoperative fluid collection. These merit surveillance. 2. A right middle lobe subpleural nodule along the minor fissure as a volume of 100 cubic mm. This level has not been included in prior cross-sectional imaging to assess for stability. Guidelines for follow up do not specifically apply due to history of malignancy. Well likely a benign subpleural lymph node, surveillance is likely warranted. 3. Other imaging findings of potential clinical significance: Aortic Atherosclerosis (ICD10-I70.0) and Emphysema (ICD10-J43.9). Coronary atherosclerosis with mitral and aortic valve calcification. Borderline appearance for distal esophageal wall thickening, query reflux. Several small left lower lobe nodules in the 3-4 mm range are probably inflammatory. Cholelithiasis. Cystoprostatectomy with loop ileostomy. Sigmoid diverticulosis. Notable common and external iliac stenosis bilaterally due to atherosclerosis. Lumbar impingement at L2- 3, L3-4, and L4-5.     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, a 76 y.o. male, returns for routine follow-up of his bladder cancer and normocytic anemia. Jeffery Ponce was last seen on 06/27/2020.   Today he reports feeling okay. 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 He had a blood transfusion in [place] in 2020, but denies ever being infected with hepatitis. He denies having any issues with his ileal conduit. He is not currently taking any iron tablets. He is currently taking Remeron for his sleep, but he complains of waking up frequently at night and has to get up to walk around because he is unable to fall back asleep.  He used to drink heavily and quit 18 years ago. He is doing plenty of walking and is staying active.   REVIEW OF SYSTEMS:  Review of Systems  Constitutional: Positive for fatigue (50%). Negative for appetite change.  Musculoskeletal: Positive for arthralgias (6/10 R hip & L arm pain).  Hematological: Bruises/bleeds easily (easy bruising).  Psychiatric/Behavioral: Positive for depression and sleep disturbance (Remeron not helping). The patient is nervous/anxious.   All other systems reviewed and are negative.   PAST MEDICAL/SURGICAL HISTORY:  Past Medical History:  Diagnosis Date  . Arthritis   . Chronic renal disease, stage 3, moderately decreased glomerular filtration rate (GFR) between 30-59 mL/min/1.73 square meter (HCC) 11/28/2017  . Heart murmur   . History of kidney stones   . Hypercholesteremia   . Urothelial carcinoma ([place]) 12/10/2016  . 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 BIOPSY  11/13/2018  Procedure: BIOPSY;  Surgeon: Danie Binder, MD;  [facility];  Service: Endoscopy;;  gastric bx's  . BIOPSY  12/22/2019   Procedure: BIOPSY;  Surgeon: Rogene Houston, MD;  [facility];  Service: Endoscopy;;  gastric  . CARDIAC CATHETERIZATION    . COLONOSCOPY WITH PROPOFOL N/A 11/14/2018   Procedure: COLONOSCOPY WITH PROPOFOL;  Surgeon: Danie Binder, MD;  [facility];  Service: Endoscopy;  Laterality: N/A;  . CYSTOSCOPY WITH INJECTION N/A 07/30/2017   Procedure: CYSTOSCOPY WITH INJECTION OF INDOCYANINE GREEN DYE;  Surgeon: Alexis Frock, MD;  Location: [facility];  Service: Urology;  Laterality: N/A;  . ESOPHAGOGASTRODUODENOSCOPY N/A 12/22/2019   Procedure: ESOPHAGOGASTRODUODENOSCOPY (EGD);  Surgeon: Rogene Houston, MD;  [facility];  Service: Endoscopy;  Laterality: N/A;  1:00  . ESOPHAGOGASTRODUODENOSCOPY (EGD) WITH PROPOFOL N/A 11/13/2018   Procedure: ESOPHAGOGASTRODUODENOSCOPY (EGD) WITH PROPOFOL;  Surgeon: Danie Binder, MD;  [facility];  Service: Endoscopy;  Laterality: N/A;  . FEMUR FRACTURE SURGERY Right   . GIVENS CAPSULE STUDY N/A 10/29/2019   Procedure: GIVENS CAPSULE STUDY;  Surgeon: Rogene Houston, MD;  [facility];  Service: Endoscopy;  Laterality: N/A;  730am  . GIVENS CAPSULE STUDY N/A 12/22/2019   Procedure: GIVENS CAPSULE STUDY;  Surgeon: Rogene Houston, MD;  [facility];  Service: Endoscopy;  Laterality: N/A;  . LOWER EXTREMITY ANGIOGRAPHY Right 10/19/2018   Procedure: LOWER EXTREMITY ANGIOGRAPHY;  Surgeon: Algernon Huxley, MD;  Location: [facility];  Service: Cardiovascular;  Laterality: Right;  . MCT 3D RECONSTRUCTION ([facility] HX) Left    arm  . open reduction and internal fixation leg Right    hip and leg.  Marland Kitchen POLYPECTOMY  11/14/2018   Procedure: POLYPECTOMY;  Surgeon: Danie Binder, MD;  [facility];  Service: Endoscopy;;  . PORT-A-CATH REMOVAL N/A 07/20/2018   Procedure: REMOVAL PORT-A-CATH;  Surgeon: Aviva Signs, MD;  [facility]: [facility];  Service: General;  Laterality: N/A;  . PORTACATH PLACEMENT N/A 12/23/2016   Procedure:  PLACEMENT OF TUNNELED CENTRAL VENOUS CATHETER RIGHT INTERNAL JUGULAR WITH SUBCUTANEOUS PORT;  Surgeon: Vickie Epley, MD;  Location: [facility];  Service: Vascular;  Laterality: N/A;  . reattatchment of left arm     from [place]  . TRANSURETHRAL RESECTION OF BLADDER TUMOR N/A 11/19/2016   Procedure: TRANSURETHRAL RESECTION OF BLADDER TUMOR (TURBT) WITH EPIRUBICIN INJECTION;  Surgeon: Franchot Gallo, MD;  [facility];  Service: Urology;  Laterality: N/A;    SOCIAL HISTORY:  Social History   Socioeconomic History  . Marital status: Married    Spouse name: Not on file  . Number of children: Not on file  . Years of education: Not on file  . Highest education level: Not on file  Occupational History  . Not on file  Tobacco Use  . Smoking status: Former Smoker    Packs/day: 0.50    Years: 50.00    Pack years: 25.00    Types: Cigarettes    Quit date: 10/30/2018    Years since quitting: 2.2  . Smokeless tobacco: Never Used  Vaping Use  . Vaping Use: Never used  Substance and Sexual Activity  . Alcohol use: No  . Drug use: No  . Sexual activity: Never    Birth control/protection: None    Comment: married-30 years-2 children by first wife  Other Topics Concern  . Not on file  Social History  Narrative  . Not on file   Social Determinants of Health   Financial Resource Strain: Medium Risk  . Difficulty of Paying Living Expenses: Somewhat hard  Food Insecurity: No Food Insecurity  . Worried About Charity fundraiser in the Last Year: Never true  . Ran Out of Food in the Last Year: Never true  Transportation Needs: No Transportation Needs  . Lack of Transportation (Medical): No  . Lack of Transportation (Non-Medical): No  Physical Activity: Sufficiently Active  . Days of Exercise per Week: 7 days  . Minutes of Exercise per Session: 60 min  Stress: Stress Concern Present  . Feeling of Stress : To some extent  Social Connections: Moderately Integrated  . Frequency of  Communication with Friends and Family: Three times a week  . Frequency of Social Gatherings with Friends and Family: Three times a week  . Attends Religious Services: More than 4 times per year  . Active Member of Clubs or Organizations: No  . Attends Archivist Meetings: Never  . Marital Status: Married  Human resources officer Violence: Not At Risk  . Fear of Current or Ex-Partner: No  . Emotionally Abused: No  . Physically Abused: No  . Sexually Abused: No    FAMILY HISTORY:  Family History  Problem Relation Age of Onset  . Aneurysm Mother   . Diabetes Father   . Colon cancer Brother     CURRENT MEDICATIONS:  Current Outpatient Medications  Medication Sig Dispense Refill  . amLODipine (NORVASC) 5 MG tablet Take 5 mg by mouth daily.    . cholecalciferol (VITAMIN D) 25 MCG (1000 UT) tablet Take 1,000 Units by mouth daily.   4  . FERREX 150 150 MG capsule Take 150 mg by mouth daily.     . GNP ASPIRIN LOW DOSE 81 MG EC tablet TAKE 1 TABLET BY MOUTH ONCE DAILY. (Patient taking differently: Take 81 mg by mouth daily.) 30 tablet 11  . hydrochlorothiazide (HYDRODIURIL) 12.5 MG tablet Take 1 tablet by mouth daily.    . mirtazapine (REMERON) 30 MG tablet Take 1 tablet by mouth at bedtime.    Marland Kitchen morphine (MS CONTIN) 15 MG 12 hr tablet Take 15 mg by mouth 2 (two) times daily as needed for pain.    . Oxycodone HCl 10 MG TABS Take 1-2 tablets (10-20 mg total) by mouth every 4 (four) hours as needed (pain). Post-operatively 40 tablet 0  . polyethylene glycol (MIRALAX) packet Take 17 g by mouth daily. 28 each 1  . sodium bicarbonate 650 MG tablet Take 650 mg by mouth 2 (two) times daily.    . sodium zirconium cyclosilicate (LOKELMA) 10 g PACK packet Take 1 packet by mouth daily.    Marland Kitchen albuterol (VENTOLIN HFA) 108 (90 Base) MCG/ACT inhaler Inhale 1-2 puffs into the lungs every 6 (six) hours as needed. (Patient not taking: Reported on 06/27/2020)    . atorvastatin (LIPITOR) 10 MG tablet Take 1  tablet (10 mg total) by mouth daily. 30 tablet 11  . lidocaine-prilocaine (EMLA) cream lidocaine-prilocaine 2.5 %-2.5 % topical cream (Patient not taking: Reported on 06/27/2020)     No current facility-administered medications for this visit.    ALLERGIES:  Allergies  Allergen Reactions  . Penicillins Anaphylaxis and Other (See Comments)    Has patient had a PCN reaction causing immediate rash, facial/tongue/throat swelling, SOB or lightheadedness with hypotension:Yes Has patient had a PCN reaction causing severe rash involving mucus membranes or skin necrosis:Yes Has patient  had a PCN reaction that required hospitalization:No Has patient had a PCN reaction occurring within the last 10 years:No If all of the above answers are "NO", then may proceed with Cephalosporin use.   . Ciprofloxacin Itching    Itching at IV site. No hives or shortness of breathe.  . Statins Other (See Comments)  . Ambien [Zolpidem Tartrate] Other (See Comments)    "Sleep walking"  . Oxycodone Nausea Only  . Zolpidem     Other reaction(s): Other (see comments) "Sleep walking"    PHYSICAL EXAM:  Performance status (ECOG): 1 - Symptomatic but completely ambulatory  Vitals:   01/15/21 1157  BP: 129/64  Pulse: 87  Resp: 16  Temp: 98.1 F (36.7 C)  SpO2: 98%   Wt Readings from Last 3 Encounters:  01/15/21 164 lb 12.8 oz (74.8 kg)  06/27/20 158 lb 6.4 oz (71.8 kg)  12/28/19 161 lb 6.4 oz (73.2 kg)   Physical Exam Vitals reviewed.  Constitutional:      Appearance: Normal appearance.  Cardiovascular:     Rate and Rhythm: Normal rate and regular rhythm.     Pulses: Normal pulses.     Heart sounds: Normal heart sounds.  Pulmonary:     Effort: Pulmonary effort is normal.     Breath sounds: Normal breath sounds.  Abdominal:     Comments: Midline ileal conduit  Neurological:     General: No focal deficit present.     Mental Status: He is alert and oriented to person, place, and time.  Psychiatric:         Mood and Affect: Mood normal.        Behavior: Behavior normal.      LABORATORY DATA:  I have reviewed the labs as listed.  CBC Latest Ref Rng & Units 01/10/2021 06/20/2020 12/17/2019  WBC 4.0 - 10.5 K/uL 10.1 8.1 9.1  Hemoglobin 13.0 - 17.0 g/dL 13.8 12.3(L) 12.8(L)  Hematocrit 39.0 - 52.0 % 43.4 39.8 42.5  Platelets 150 - 400 K/uL 300 210 220   CMP Latest Ref Rng & Units 01/10/2021 06/20/2020 12/17/2019  Glucose 70 - 99 mg/dL 97 143(H) 100(H)  BUN 8 - 23 mg/dL 27(H) 23 30(H)  Creatinine 0.61 - 1.24 mg/dL 1.90(H) 2.00(H) 1.68(H)  Sodium 135 - 145 mmol/L 139 139 137  Potassium 3.5 - 5.1 mmol/L 4.2 4.3 4.7  Chloride 98 - 111 mmol/L 106 104 105  CO2 22 - 32 mmol/L 25 24 25   Calcium 8.9 - 10.3 mg/dL 9.5 8.7(L) 9.2  Total Protein 6.5 - 8.1 g/dL 7.7 6.9 7.6  Total Bilirubin 0.3 - 1.2 mg/dL 0.4 0.5 0.6  Alkaline Phos 38 - 126 U/L 118 116 105  AST 15 - 41 U/L 21 20 16   ALT 0 - 44 U/L 26 23 10    Lab Results  Component Value Date   TIBC 413 01/10/2021   TIBC 251 06/20/2020   TIBC 366 12/17/2019   FERRITIN 18 (L) 01/10/2021   FERRITIN 87 06/20/2020   FERRITIN 20 (L) 12/17/2019   IRONPCTSAT 12 (L) 01/10/2021   IRONPCTSAT 19 06/20/2020   IRONPCTSAT 13 (L) 12/17/2019    DIAGNOSTIC IMAGING:  I have independently reviewed the scans and discussed with the patient. CT Abdomen Pelvis W Contrast  Result Date: 01/10/2021 CLINICAL DATA:  History of urothelial carcinoma with cysto prostatectomy in 2018 EXAM: CT ABDOMEN AND PELVIS WITH CONTRAST TECHNIQUE: Multidetector CT imaging of the abdomen and pelvis was performed using the standard protocol following bolus administration  of intravenous contrast. CONTRAST:  65mL OMNIPAQUE IOHEXOL 300 MG/ML  SOLN COMPARISON:  Multiple priors including most recent CT chest abdomen and pelvis June 20, 2020 FINDINGS: Lower chest: Centrilobular emphysema. Borderline cardiomegaly. No pericardial effusion. Hepatobiliary: Caudate lobe enlargement and medial segment  left lobe of liver atrophy, again seen is the too small to accurately characterize right hepatic lobe lesion, unchanged in size and likely a cyst. Cholecystectomy. No biliary ductal dilatation. Pancreas: Unremarkable. No pancreatic ductal dilatation or surrounding inflammatory changes. Spleen: Normal in size without focal abnormality. Adrenals/Urinary Tract: Similar mild left nodular adrenal thickening. Normal right adrenal gland. No hydronephrosis. No suspicious renal masses. No filling defect in the collecting system or proximal ureter on delayed imaging. Changes of cysto prostatectomy with right ventral ileal conduit. Stomach/Bowel: Stomach is unremarkable. No evidence of abnormal small bowel wall thickening or dilation. Small volume of formed stool throughout the colon. Sigmoid diverticulosis. There is similar chronic appearing wall thickening of the sigmoid colon. Vascular/Lymphatic: Extensive aorto bi-iliac atherosclerotic disease. Similar prominent porta hepatis nodes which are likely reactive. No pathologically enlarged pelvic lymph nodes. Reproductive: Cystoprostatectomy. Other: Similar small likely seroma in the right hemipelvis measuring 7 mm (series 2, image 76). No abdominopelvic ascites. Musculoskeletal: Mild diffuse demineralization of bone. Right proximal femoral fixation device. Tiny sclerotic lesion left iliac bone is unchanged and favored represent a benign bone island. No new suspicious lytic or blastic lesions of bone. IMPRESSION: 1. Status post cysto prostatectomy with right ventral ileal conduit. No evidence of recurrent or metastatic disease within the abdomen or pelvis. 2. Sigmoid diverticulosis with similar chronic appearing wall thickening of the sigmoid colon, which may be sequela of chronic inflammation. However recommend correlation with history of colonoscopy. 3. Hepatic morphology suggestive of cirrhosis, recommend correlation with risk factors. 4. Emphysema and aortic  atherosclerosis. Aortic Atherosclerosis (ICD10-I70.0) and Emphysema (ICD10-J43.9). Electronically Signed   By: Dahlia Bailiff MD   On: 01/10/2021 15:31     ASSESSMENT:  1. Normocytic anemia: -Combination anemia from CKD and IDA. -Receives intermittent parenteral iron therapy.  Last Venofer was on 01/05/2020. -EGD on 12/22/2019 showed normal esophagus, multiple petechial hemorrhages noted at gastric body and antrum.  Normal duodenal bulb and second part of duodenum. -Capsule study on 12/22/2019 showed multiple petechial hemorrhages involving gastric mucosa, fresh blood noted on images likely from trauma as the capsule forced across the pylorus.  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  Dark black liquid noted in the distal small bowel likely due to iron.  2. 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 -As there was suspicion for muscle invasion, underwent cisplatin and gemcitabine 4 cycles from 12/30/2016 through 04/21/2017 followed by cystoprostatectomy and ileal conduit on 07/30/2017, showing residual disease of 0.4 cm. -CT CAP on 06/20/2020 with no findings of recurrence or metastatic disease.  Hepatic morphology with mild cirrhosis cannot be excluded.  Similar bilateral pulmonary nodules, favoring benign etiology.  Slight enlargement of mediastinal node which is not pathologic considered reactive.   PLAN:  1. Normocytic anemia: -Reviewed labs today.  Hemoglobin 13.8.  Ferritin is down to 18 from 87 previously.  Folic acid and [id number] are normal. -Recommend Venofer 300 mg x 3 doses. -Repeat labs in 6 months.  2. Bladder cancer: -Reviewed CTAP from 01/10/2021 which did not show any evidence of metastatic disease. -Reviewed incidental finding of cirrhosis.  No history of hepatitis.  He had a history of alcohol abuse in the past which likely could have contributed to it.  He will  follow up with GI. -RTC 6 months with CTAP without contrast.   Orders placed this encounter:  Orders  Placed This Encounter  Procedures  . CT Abdomen Pelvis W Contrast  . CBC with Differential/Platelet  . Comprehensive metabolic panel  . Iron and TIBC  . Ferritin     Derek Jack, MD [place] [phone]   I, Milinda Antis, am acting as a scribe for Dr. Sanda Linger.  I, Derek Jack MD, have reviewed the above documentation for accuracy and completeness, and I agree with the above.

## 2021-01-22 NOTE — Progress Notes (Signed)
Premedications added to Venofer 300 mg per Dr Delton Coombes request due to multiple drug allergies.  Tylenol 650 mg oral x 1 Pepcid 20 mg oral x 1 Loratidine 10 mg oral x 1  Orders updated to reflect above.  T.O. Dr Alphonzo Severance Aurora Mask, PharmD  01/22/21 @ 1630

## 2021-01-25 ENCOUNTER — Inpatient Hospital Stay (HOSPITAL_COMMUNITY): Payer: Medicare HMO

## 2021-01-25 ENCOUNTER — Other Ambulatory Visit: Payer: Self-pay

## 2021-01-25 ENCOUNTER — Encounter (HOSPITAL_COMMUNITY): Payer: Self-pay

## 2021-01-25 VITALS — BP 122/49 | HR 64 | Temp 96.6°F | Resp 18 | Wt 163.0 lb

## 2021-01-25 DIAGNOSIS — C673 Malignant neoplasm of anterior wall of bladder: Secondary | ICD-10-CM | POA: Diagnosis not present

## 2021-01-25 DIAGNOSIS — D649 Anemia, unspecified: Secondary | ICD-10-CM

## 2021-01-25 DIAGNOSIS — D509 Iron deficiency anemia, unspecified: Secondary | ICD-10-CM

## 2021-01-25 MED ORDER — SODIUM CHLORIDE 0.9 % IV SOLN
300.0000 mg | Freq: Once | INTRAVENOUS | Status: AC
  Administered 2021-01-25: 300 mg via INTRAVENOUS
  Filled 2021-01-25: qty 15

## 2021-01-25 MED ORDER — SODIUM CHLORIDE 0.9 % IV SOLN: Freq: Once | INTRAVENOUS | Status: AC

## 2021-01-25 MED ORDER — FAMOTIDINE 20 MG PO TABS
20.0000 mg | ORAL_TABLET | Freq: Once | ORAL | Status: AC
  Administered 2021-01-25: 20 mg via ORAL

## 2021-01-25 MED ORDER — ACETAMINOPHEN 325 MG PO TABS
650.0000 mg | ORAL_TABLET | Freq: Once | ORAL | Status: AC
  Administered 2021-01-25: 650 mg via ORAL

## 2021-01-25 MED ORDER — LORATADINE 10 MG PO TABS
10.0000 mg | ORAL_TABLET | Freq: Once | ORAL | Status: AC
  Administered 2021-01-25: 10 mg via ORAL

## 2021-01-25 NOTE — Progress Notes (Signed)
Pt asking about the stage of his cirrhosis.  Spoke with Dr Raliegh Ip and he wants him to follow up with Dr Laural Golden about this.  Printed CT scan results  for patient.  Gave him Dr Otelia Limes number.   Tolerated treatment well today without incidence. Discharged ambulatory in stable condition.  Vital signs stable prior to discharge.

## 2021-01-25 NOTE — Patient Instructions (Signed)
[facility]  Discharge Instructions:   _______________________________________________________________  Thank you for choosing [facility] at [facility] to provide your oncology and hematology care.  To afford each patient quality time with our providers, please arrive at least 15 minutes before your scheduled appointment.  You need to re-schedule your appointment if you arrive 10 or more minutes late.  We strive to give you quality time with our providers, and arriving late affects you and other patients whose appointments are after yours.  Also, if you no show three or more times for appointments you may be dismissed from the clinic.  Again, thank you for choosing [facility] at [facility]. Our hope is that these requests will allow you access to exceptional care and in a timely manner. _______________________________________________________________  If you have questions after your visit, please contact our office at [phone] between the hours of 8:30 a.m. and 5:00 p.m. Voicemails left after 4:30 p.m. will not be returned until the following business day. _______________________________________________________________  For prescription refill requests, have your pharmacy contact our office. _______________________________________________________________  Recommendations made by the consultant and any test results will be sent to your referring physician. _______________________________________________________________ 

## 2021-01-29 ENCOUNTER — Other Ambulatory Visit: Payer: Self-pay

## 2021-01-29 ENCOUNTER — Ambulatory Visit (INDEPENDENT_AMBULATORY_CARE_PROVIDER_SITE_OTHER): Payer: Medicare HMO | Admitting: Gastroenterology

## 2021-01-29 ENCOUNTER — Encounter (INDEPENDENT_AMBULATORY_CARE_PROVIDER_SITE_OTHER): Payer: Self-pay | Admitting: Gastroenterology

## 2021-01-29 VITALS — BP 120/67 | HR 88 | Temp 97.4°F | Ht 70.5 in | Wt 163.8 lb

## 2021-01-29 DIAGNOSIS — D500 Iron deficiency anemia secondary to blood loss (chronic): Secondary | ICD-10-CM

## 2021-01-29 DIAGNOSIS — R932 Abnormal findings on diagnostic imaging of liver and biliary tract: Secondary | ICD-10-CM | POA: Diagnosis not present

## 2021-01-29 NOTE — Progress Notes (Signed)
Jeffery Ponce, M.D. Gastroenterology & Hepatology [facility] [address] [place], [place] [address]  Primary Care Physician: [facility] [place] [address]  I will communicate my assessment and recommendations to the referring MD via EMR.  Problems: 1. 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 Abnormal liver in CT scan, ?cirrhosis  History of Present Illness: Jeffery Ponce is a 75 y.o. 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  The patient was last seen on 10/05/2019. At that time, the patient had a capsule endoscopy performed for evaluation of iron deficiency anemia.  The patient had previous EGD and colonoscopy as described below.  Capsule endoscopy showed presence of granularity, erythema and edema of the gastric antrum, Presence of fresh blood in the scope trauma in the duodenum, had presence of 3 nonbleeding AVMs in the proximal small bowel and presence of dark stool but no active bleeding or other lesions in the distal small bowel.  The patient reports feeling well.  He was referred by Dr. Delton Coombes for further evaluation of abnormal imaging of his liver which was suggestive of liver cirrhosis. Receiving IV iron with adequate response as his hemoglobin has improved and he has not required any more blood transfusions. The patient denies having any complaints.  He has been the patient denies having any nausea, vomiting, fever, chills, hematochezia, melena, hematemesis, abdominal distention, abdominal pain, diarrhea, jaundice, pruritus or weight loss. Has actually gained 10 lb, states his appetite improved after he finished the chemotherapy.  Patient used to drink 1 pint of [place] a day for 20 years on and off, quit drinking 7 years ago. Smokes 1 pack of  cigarettes a day.  Most recent blood test were performed on 01/10/2021, which showed CMP with creatinine 1.9, BUN was 27, normal electrolytes, normal liver enzymes, iron was 48, TIBC 413, ferritin 18 iron saturation 12%, CBC with hemoglobin of 13.8, white blood cell count 10.1 and platelets 300.  As part of the surveillance of his bladder cancer, he underwent a CT of the abdomen and pelvis with IV contrast on 01/10/2021 which showed presence of caudate lobe hypertrophy and medial segmental left lobe liver atrophy suggestive of liver cirrhosis.  Last EGD 11/13/2018: NO SOURCE FOR BRBPR/MELENA FOUND. - Small hiatal hernia. - MILD Gastritis/Duodenitis.  Last Colonoscopy 11/14/2018 by Dr. Barney Drain: - RECTAL BLEEDING MOST LIKELY DUE TO DIVERTICULAR BLEED, LESS LIKELY R COLON AVM OR SMALL BOWEL SOURCE. - A single non-bleeding colonic angioectasia. Treated with argon plasma coagulation (APC). - Diverticulosis in the recto-sigmoid colon, in the sigmoid colon, in the descending colon and in the distal ascending colon. - Six 4 to 7 mm polyps in the rectum and in the distal ascending colon, removed with a cold snare. Biopsies showed polyps in the distal ascending colon were tubular adenomatous, rectal polyps were hyperplastic.  - Internal hemorrhoids. - Tortuous colon.  1. Colon, polyp(s), ascending - TUBULAR ADENOMA, NEGATIVE FOR HIGH GRADE DYSPLASIA. 2. Rectum, polyp(s) - HYPERPLASTIC POLYP  Past Medical History: Past Medical History:  Diagnosis Date  . Arthritis   . Chronic renal disease, stage 3, moderately decreased glomerular filtration rate (GFR) between 30-59 mL/min/1.73 square meter (HCC) 11/28/2017  . Heart murmur   . History of kidney stones   . Hypercholesteremia   . Urothelial carcinoma ([place])  12/10/2016  . 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 BIOPSY  11/13/2018   Procedure: BIOPSY;   Surgeon: Danie Binder, MD;  [facility];  Service: Endoscopy;;  gastric bx's  . BIOPSY  12/22/2019   Procedure: BIOPSY;  Surgeon: Rogene Houston, MD;  [facility];  Service: Endoscopy;;  gastric  . CARDIAC CATHETERIZATION    . COLONOSCOPY WITH PROPOFOL N/A 11/14/2018   Procedure: COLONOSCOPY WITH PROPOFOL;  Surgeon: Danie Binder, MD;  [facility];  Service: Endoscopy;  Laterality: N/A;  . CYSTOSCOPY WITH INJECTION N/A 07/30/2017   Procedure: CYSTOSCOPY WITH INJECTION OF INDOCYANINE GREEN DYE;  Surgeon: Alexis Frock, MD;  Location: [facility];  Service: Urology;  Laterality: N/A;  . ESOPHAGOGASTRODUODENOSCOPY N/A 12/22/2019   Procedure: ESOPHAGOGASTRODUODENOSCOPY (EGD);  Surgeon: Rogene Houston, MD;  [facility];  Service: Endoscopy;  Laterality: N/A;  1:00  . ESOPHAGOGASTRODUODENOSCOPY (EGD) WITH PROPOFOL N/A 11/13/2018   Procedure: ESOPHAGOGASTRODUODENOSCOPY (EGD) WITH PROPOFOL;  Surgeon: Danie Binder, MD;  [facility];  Service: Endoscopy;  Laterality: N/A;  . FEMUR FRACTURE SURGERY Right   . GIVENS CAPSULE STUDY N/A 10/29/2019   Procedure: GIVENS CAPSULE STUDY;  Surgeon: Rogene Houston, MD;  [facility];  Service: Endoscopy;  Laterality: N/A;  730am  . GIVENS CAPSULE STUDY N/A 12/22/2019   Procedure: GIVENS CAPSULE STUDY;  Surgeon: Rogene Houston, MD;  [facility];  Service: Endoscopy;  Laterality: N/A;  . LOWER EXTREMITY ANGIOGRAPHY Right 10/19/2018   Procedure: LOWER EXTREMITY ANGIOGRAPHY;  Surgeon: Algernon Huxley, MD;  Location: [facility];  Service: Cardiovascular;  Laterality: Right;  . MCT 3D RECONSTRUCTION ([facility] HX) Left    arm  . open reduction and internal fixation leg Right    hip and leg.  Marland Kitchen POLYPECTOMY  11/14/2018   Procedure: POLYPECTOMY;  Surgeon: Danie Binder, MD;  [facility];  Service: Endoscopy;;  . PORT-A-CATH REMOVAL N/A 07/20/2018   Procedure: REMOVAL  PORT-A-CATH;  Surgeon: Aviva Signs, MD;  Location: [facility];  Service: General;  Laterality: N/A;  . PORTACATH PLACEMENT N/A 12/23/2016   Procedure: PLACEMENT OF TUNNELED CENTRAL VENOUS CATHETER RIGHT INTERNAL JUGULAR WITH SUBCUTANEOUS PORT;  Surgeon: Vickie Epley, MD;  Location: [facility];  Service: Vascular;  Laterality: N/A;  . reattatchment of left arm     from [place]  . TRANSURETHRAL RESECTION OF BLADDER TUMOR N/A 11/19/2016   Procedure: TRANSURETHRAL RESECTION OF BLADDER TUMOR (TURBT) WITH EPIRUBICIN INJECTION;  Surgeon: Franchot Gallo, MD;  Location: [facility];  Service: Urology;  Laterality: N/A;    Family History: Family History  Problem Relation Age of Onset  . Aneurysm Mother   . Diabetes Father   . Colon cancer Brother     Social History: Social History   Tobacco Use  Smoking Status Former Smoker  . Packs/day: 0.50  . Years: 50.00  . Pack years: 25.00  . Types: Cigarettes  . Quit date: 10/30/2018  . Years since quitting: 2.2  Smokeless Tobacco Never Used   Social History   Substance and Sexual Activity  Alcohol Use No   Social History   Substance and Sexual Activity  Drug Use No    Allergies: Allergies  Allergen Reactions  . Penicillins Anaphylaxis and Other (See Comments)    Has patient had a PCN reaction causing immediate rash,  facial/tongue/throat swelling, SOB or lightheadedness with hypotension:Yes Has patient had a PCN reaction causing severe rash involving mucus membranes or skin necrosis:Yes Has patient had a PCN reaction that required hospitalization:No Has patient had a PCN reaction occurring within the last 10 years:No If all of the above answers are "NO", then may proceed with Cephalosporin use.   . Ciprofloxacin Itching    Itching at IV site. No hives or shortness of breathe.  . Statins Other (See Comments)  . Ambien [Zolpidem Tartrate] Other (See Comments)    "Sleep walking"  . Oxycodone Nausea Only  . Zolpidem     Other  reaction(s): Other (see comments) "Sleep walking"    Medications: Current Outpatient Medications  Medication Sig Dispense Refill  . albuterol (VENTOLIN HFA) 108 (90 Base) MCG/ACT inhaler Inhale 1-2 puffs into the lungs every 6 (six) hours as needed.    Marland Kitchen amLODipine (NORVASC) 5 MG tablet Take 5 mg by mouth daily.    . cholecalciferol (VITAMIN D) 25 MCG (1000 UT) tablet Take 1,000 Units by mouth daily.   4  . GNP ASPIRIN LOW DOSE 81 MG EC tablet TAKE 1 TABLET BY MOUTH ONCE DAILY. (Patient taking differently: Take 81 mg by mouth daily.) 30 tablet 11  . hydrochlorothiazide (HYDRODIURIL) 12.5 MG tablet Take 1 tablet by mouth daily.    . mirtazapine (REMERON) 30 MG tablet Take 1 tablet by mouth at bedtime.    Marland Kitchen morphine (MS CONTIN) 15 MG 12 hr tablet Take 15 mg by mouth 2 (two) times daily as needed for pain.    . Oxycodone HCl 10 MG TABS Take 1-2 tablets (10-20 mg total) by mouth every 4 (four) hours as needed (pain). Post-operatively 40 tablet 0  . polyethylene glycol (MIRALAX) packet Take 17 g by mouth daily. 28 each 1  . sodium bicarbonate 650 MG tablet Take 650 mg by mouth 2 (two) times daily.    . sodium zirconium cyclosilicate (LOKELMA) 10 g PACK packet Take 1 packet by mouth daily.    Marland Kitchen atorvastatin (LIPITOR) 10 MG tablet Take 1 tablet (10 mg total) by mouth daily. (Patient not taking: Reported on 01/29/2021) 30 tablet 11  . FERREX 150 150 MG capsule Take 150 mg by mouth daily.  (Patient not taking: Reported on 01/29/2021)     No current facility-administered medications for this visit.    Review of Systems: GENERAL: negative for malaise, night sweats HEENT: No changes in hearing or vision, no nose bleeds or other nasal problems. NECK: Negative for lumps, goiter, pain and significant neck swelling RESPIRATORY: Negative for cough, wheezing CARDIOVASCULAR: Negative for chest pain, leg swelling, palpitations, orthopnea GI: SEE HPI MUSCULOSKELETAL: Negative for joint pain or swelling, back  pain, and muscle pain. SKIN: Negative for lesions, rash PSYCH: Negative for sleep disturbance, mood disorder and recent psychosocial stressors. HEMATOLOGY Negative for prolonged bleeding, bruising easily, and swollen nodes. ENDOCRINE: Negative for cold or heat intolerance, polyuria, polydipsia and goiter. NEURO: negative for tremor, gait imbalance, syncope and seizures. The remainder of the review of systems is noncontributory.   Physical Exam: BP 120/67 (BP Location: Left Arm, Patient Position: Sitting, Cuff Size: Normal)   Pulse 88   Temp (!) 97.4 F (36.3 C) (Oral)   Ht 5' 10.5" (1.791 m)   Wt 163 lb 12.8 oz (74.3 kg)   BMI 23.17 kg/m  GENERAL: The patient is AO x3, in no acute distress. Elder. HEENT: Head is normocephalic and atraumatic. EOMI are intact. Mouth is well hydrated and without  lesions. NECK: Supple. No masses LUNGS: Clear to auscultation. No presence of rhonchi/wheezing/rales. Adequate chest expansion HEART: RRR, normal s1 and s2. ABDOMEN: Soft, nontender, no guarding, no peritoneal signs, and nondistended. BS +. No masses. EXTREMITIES: Without any cyanosis, clubbing, rash, lesions or edema. NEUROLOGIC: AOx3, no focal motor deficit. SKIN: no jaundice, no rashes  Imaging/Labs: as above  I personally reviewed and interpreted the available labs, imaging and endoscopic files.  Impression and Plan: SYRUS NAKAMA is a 75 y.o. 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  The patient was found to have incidental alterations in his liver morphology that are suggestive of cirrhosis, as having hypertrophic caudate lobe has high specificity for this condition.  He is to be a heavy drinker in the past which increases the risk for liver cirrhosis but he is not currently drinking.  He does not have any biochemical alterations consistent  with cirrhosis and has not presented any decompensating events.  At this point we will need to evaluate his condition with a liver elastography as a noninvasive way to testing since he will increase his risk of [place] that will require surveillance.  He does not have any elevated liver enzymes so I suspect this is less likely related to autoimmune etiologies or ongoing metabolic disease.  In terms of his iron deficiency anemia, the patient has responded to his IV iron infusions has not presented any overt episode of gastrointestinal bleeding.  It is very likely he has presented these episodes of anemia as a result of intermittent losses from his AVMs.  No further management with endoscopic intervention is warranted at this time unless he presents any significant drop that does not respond to IV iron infusions or significant gastrointestinal bleeding.  - Schedule liver elastography - Continue IV Fe infusions per Dr. Delton Coombes  All questions were answered.      Harvel Quale, MD Gastroenterology and Hepatology [facility]

## 2021-01-29 NOTE — Patient Instructions (Signed)
Schedule liver elastography

## 2021-02-01 ENCOUNTER — Inpatient Hospital Stay (HOSPITAL_COMMUNITY): Payer: Medicare HMO

## 2021-02-01 ENCOUNTER — Other Ambulatory Visit: Payer: Self-pay

## 2021-02-01 VITALS — BP 135/72 | HR 82 | Temp 96.7°F | Resp 18 | Wt 163.0 lb

## 2021-02-01 DIAGNOSIS — D649 Anemia, unspecified: Secondary | ICD-10-CM

## 2021-02-01 DIAGNOSIS — C673 Malignant neoplasm of anterior wall of bladder: Secondary | ICD-10-CM | POA: Diagnosis not present

## 2021-02-01 DIAGNOSIS — D500 Iron deficiency anemia secondary to blood loss (chronic): Secondary | ICD-10-CM

## 2021-02-01 MED ORDER — SODIUM CHLORIDE 0.9 % IV SOLN: Freq: Once | INTRAVENOUS | Status: AC

## 2021-02-01 MED ORDER — FAMOTIDINE 20 MG PO TABS
20.0000 mg | ORAL_TABLET | Freq: Once | ORAL | Status: AC
  Administered 2021-02-01: 20 mg via ORAL
  Filled 2021-02-01: qty 1

## 2021-02-01 MED ORDER — SODIUM CHLORIDE 0.9 % IV SOLN
300.0000 mg | Freq: Once | INTRAVENOUS | Status: AC
  Administered 2021-02-01: 300 mg via INTRAVENOUS
  Filled 2021-02-01: qty 15

## 2021-02-01 MED ORDER — ACETAMINOPHEN 325 MG PO TABS
650.0000 mg | ORAL_TABLET | Freq: Once | ORAL | Status: AC
  Administered 2021-02-01: 650 mg via ORAL
  Filled 2021-02-01: qty 2

## 2021-02-01 MED ORDER — LORATADINE 10 MG PO TABS
10.0000 mg | ORAL_TABLET | Freq: Once | ORAL | Status: AC
  Administered 2021-02-01: 10 mg via ORAL
  Filled 2021-02-01: qty 1

## 2021-02-01 NOTE — Progress Notes (Signed)
Patient presents today for Venofer.  Vital signs WNL.  No new complaints since last visit.  Venofer given today per MD orders.  Tolerated infusion without adverse affects.  Vital signs stable.  No complaints at this time.  Discharge from clinic ambulatory in stable condition.  Alert and oriented X 3.  Follow up with [facility] as scheduled.

## 2021-02-01 NOTE — Patient Instructions (Signed)
[facility] Discharge Instructions for Patients  Today you received the following.   To help prevent nausea and vomiting after your treatment, we encourage you to take your nausea medication If you develop nausea and vomiting that is not controlled by your nausea medication, call the clinic.   BELOW ARE SYMPTOMS THAT SHOULD BE REPORTED IMMEDIATELY:  *FEVER GREATER THAN 100.5 F  *CHILLS WITH OR WITHOUT FEVER  NAUSEA AND VOMITING THAT IS NOT CONTROLLED WITH YOUR NAUSEA MEDICATION  *UNUSUAL SHORTNESS OF BREATH  *UNUSUAL BRUISING OR BLEEDING  TENDERNESS IN MOUTH AND THROAT WITH OR WITHOUT PRESENCE OF ULCERS  *URINARY PROBLEMS  *BOWEL PROBLEMS  UNUSUAL RASH Items with * indicate a potential emergency and should be followed up as soon as possible.  Feel free to call the clinic should you have any questions or concerns. The clinic phone number is [phone].  Please show the CHEMO ALERT CARD at check-in to the Emergency Department and triage nurse.   

## 2021-02-06 ENCOUNTER — Ambulatory Visit (HOSPITAL_COMMUNITY)
Admission: RE | Admit: 2021-02-06 | Discharge: 2021-02-06 | Disposition: A | Discharge status: Home / Self Care | Payer: Medicare HMO | Source: Ambulatory Visit | Attending: Gastroenterology | Admitting: Gastroenterology

## 2021-02-06 ENCOUNTER — Other Ambulatory Visit: Payer: Self-pay

## 2021-02-06 DIAGNOSIS — R932 Abnormal findings on diagnostic imaging of liver and biliary tract: Secondary | ICD-10-CM | POA: Insufficient documentation

## 2021-02-08 ENCOUNTER — Inpatient Hospital Stay (HOSPITAL_COMMUNITY): Payer: Medicare HMO | Attending: Hematology

## 2021-02-08 ENCOUNTER — Other Ambulatory Visit: Payer: Self-pay

## 2021-02-08 ENCOUNTER — Ambulatory Visit (HOSPITAL_COMMUNITY): Payer: Medicare HMO

## 2021-02-08 VITALS — BP 112/56 | HR 69 | Temp 97.1°F | Resp 18

## 2021-02-08 DIAGNOSIS — Z79899 Other long term (current) drug therapy: Secondary | ICD-10-CM | POA: Insufficient documentation

## 2021-02-08 DIAGNOSIS — R5383 Other fatigue: Secondary | ICD-10-CM | POA: Insufficient documentation

## 2021-02-08 DIAGNOSIS — C673 Malignant neoplasm of anterior wall of bladder: Secondary | ICD-10-CM | POA: Insufficient documentation

## 2021-02-08 DIAGNOSIS — Z8249 Family history of ischemic heart disease and other diseases of the circulatory system: Secondary | ICD-10-CM | POA: Insufficient documentation

## 2021-02-08 DIAGNOSIS — Z833 Family history of diabetes mellitus: Secondary | ICD-10-CM | POA: Diagnosis not present

## 2021-02-08 DIAGNOSIS — M2550 Pain in unspecified joint: Secondary | ICD-10-CM | POA: Insufficient documentation

## 2021-02-08 DIAGNOSIS — D500 Iron deficiency anemia secondary to blood loss (chronic): Secondary | ICD-10-CM

## 2021-02-08 DIAGNOSIS — D649 Anemia, unspecified: Secondary | ICD-10-CM | POA: Diagnosis not present

## 2021-02-08 DIAGNOSIS — Z87891 Personal history of nicotine dependence: Secondary | ICD-10-CM | POA: Diagnosis not present

## 2021-02-08 DIAGNOSIS — G479 Sleep disorder, unspecified: Secondary | ICD-10-CM | POA: Insufficient documentation

## 2021-02-08 DIAGNOSIS — Z9221 Personal history of antineoplastic chemotherapy: Secondary | ICD-10-CM | POA: Insufficient documentation

## 2021-02-08 DIAGNOSIS — Z800 Family history of malignant neoplasm of digestive organs: Secondary | ICD-10-CM | POA: Insufficient documentation

## 2021-02-08 MED ORDER — ACETAMINOPHEN 325 MG PO TABS
650.0000 mg | ORAL_TABLET | Freq: Once | ORAL | Status: AC
  Administered 2021-02-08: 650 mg via ORAL
  Filled 2021-02-08: qty 2

## 2021-02-08 MED ORDER — SODIUM CHLORIDE 0.9 % IV SOLN
300.0000 mg | Freq: Once | INTRAVENOUS | Status: AC
  Administered 2021-02-08: 300 mg via INTRAVENOUS
  Filled 2021-02-08: qty 15

## 2021-02-08 MED ORDER — SODIUM CHLORIDE 0.9 % IV SOLN: Freq: Once | INTRAVENOUS | Status: AC

## 2021-02-08 MED ORDER — FAMOTIDINE 20 MG PO TABS
20.0000 mg | ORAL_TABLET | Freq: Once | ORAL | Status: AC
  Administered 2021-02-08: 20 mg via ORAL
  Filled 2021-02-08: qty 1

## 2021-02-08 MED ORDER — LORATADINE 10 MG PO TABS
10.0000 mg | ORAL_TABLET | Freq: Once | ORAL | Status: AC
  Administered 2021-02-08: 10 mg via ORAL
  Filled 2021-02-08: qty 1

## 2021-02-08 NOTE — Progress Notes (Signed)
Patient presents today for Venofer infusion.  Vital signs WNL.  Patient has no new complaints since last visit.  Venofer given today per MD orders.  Stable during infusion without adverse affects.  Vital signs stable.  No complaints at this time.  Discharge from clinic ambulatory in stable condition.  Alert and oriented X 3.  Follow up with [facility] as scheduled.

## 2021-02-08 NOTE — Patient Instructions (Signed)
[facility] Discharge Instructions for Patients Receiving Chemotherapy  Today you received the following chemotherapy agents   To help prevent nausea and vomiting after your treatment, we encourage you to take your nausea medication   If you develop nausea and vomiting that is not controlled by your nausea medication, call the clinic.   BELOW ARE SYMPTOMS THAT SHOULD BE REPORTED IMMEDIATELY:  *FEVER GREATER THAN 100.5 F  *CHILLS WITH OR WITHOUT FEVER  NAUSEA AND VOMITING THAT IS NOT CONTROLLED WITH YOUR NAUSEA MEDICATION  *UNUSUAL SHORTNESS OF BREATH  *UNUSUAL BRUISING OR BLEEDING  TENDERNESS IN MOUTH AND THROAT WITH OR WITHOUT PRESENCE OF ULCERS  *URINARY PROBLEMS  *BOWEL PROBLEMS  UNUSUAL RASH Items with * indicate a potential emergency and should be followed up as soon as possible.  Feel free to call the clinic should you have any questions or concerns. The clinic phone number is [phone].  Please show the CHEMO ALERT CARD at check-in to the Emergency Department and triage nurse.   

## 2021-07-12 ENCOUNTER — Ambulatory Visit (HOSPITAL_COMMUNITY)
Admission: RE | Admit: 2021-07-12 | Discharge: 2021-07-12 | Disposition: A | Discharge status: Home / Self Care | Payer: Medicare HMO | Source: Ambulatory Visit | Attending: Hematology | Admitting: Hematology

## 2021-07-12 ENCOUNTER — Other Ambulatory Visit: Payer: Self-pay

## 2021-07-12 ENCOUNTER — Inpatient Hospital Stay (HOSPITAL_COMMUNITY): Payer: Medicare HMO | Attending: Hematology

## 2021-07-12 DIAGNOSIS — D649 Anemia, unspecified: Secondary | ICD-10-CM | POA: Diagnosis present

## 2021-07-12 DIAGNOSIS — Z87891 Personal history of nicotine dependence: Secondary | ICD-10-CM | POA: Diagnosis not present

## 2021-07-12 DIAGNOSIS — G479 Sleep disorder, unspecified: Secondary | ICD-10-CM | POA: Insufficient documentation

## 2021-07-12 DIAGNOSIS — Z800 Family history of malignant neoplasm of digestive organs: Secondary | ICD-10-CM | POA: Diagnosis not present

## 2021-07-12 DIAGNOSIS — C679 Malignant neoplasm of bladder, unspecified: Secondary | ICD-10-CM | POA: Insufficient documentation

## 2021-07-12 DIAGNOSIS — Z833 Family history of diabetes mellitus: Secondary | ICD-10-CM | POA: Diagnosis not present

## 2021-07-12 DIAGNOSIS — I700 Atherosclerosis of aorta: Secondary | ICD-10-CM | POA: Diagnosis not present

## 2021-07-12 DIAGNOSIS — Z8551 Personal history of malignant neoplasm of bladder: Secondary | ICD-10-CM | POA: Insufficient documentation

## 2021-07-12 DIAGNOSIS — R590 Localized enlarged lymph nodes: Secondary | ICD-10-CM | POA: Insufficient documentation

## 2021-07-12 DIAGNOSIS — Z8249 Family history of ischemic heart disease and other diseases of the circulatory system: Secondary | ICD-10-CM | POA: Diagnosis not present

## 2021-07-12 DIAGNOSIS — Z596 Low income: Secondary | ICD-10-CM | POA: Diagnosis not present

## 2021-07-12 DIAGNOSIS — D509 Iron deficiency anemia, unspecified: Secondary | ICD-10-CM | POA: Diagnosis present

## 2021-07-12 DIAGNOSIS — Z79899 Other long term (current) drug therapy: Secondary | ICD-10-CM | POA: Diagnosis not present

## 2021-07-12 DIAGNOSIS — R5383 Other fatigue: Secondary | ICD-10-CM | POA: Insufficient documentation

## 2021-07-12 LAB — IRON AND TIBC
Iron: 31 ug/dL — ABNORMAL LOW (ref 45–182)
Saturation Ratios: 12 % — ABNORMAL LOW (ref 17.9–39.5)
TIBC: 269 ug/dL (ref 250–450)
UIBC: 238 ug/dL

## 2021-07-12 LAB — CBC WITH DIFFERENTIAL/PLATELET
Abs Immature Granulocytes: 0.04 10*3/uL (ref 0.00–0.07)
Basophils Absolute: 0.1 10*3/uL (ref 0.0–0.1)
Basophils Relative: 1 %
Eosinophils Absolute: 0.2 10*3/uL (ref 0.0–0.5)
Eosinophils Relative: 2 %
HCT: 41 % (ref 39.0–52.0)
Hemoglobin: 12.8 g/dL — ABNORMAL LOW (ref 13.0–17.0)
Immature Granulocytes: 0 %
Lymphocytes Relative: 18 %
Lymphs Abs: 1.9 10*3/uL (ref 0.7–4.0)
MCH: 29.8 pg (ref 26.0–34.0)
MCHC: 31.2 g/dL (ref 30.0–36.0)
MCV: 95.6 fL (ref 80.0–100.0)
Monocytes Absolute: 0.9 10*3/uL (ref 0.1–1.0)
Monocytes Relative: 9 %
Neutro Abs: 7.2 10*3/uL (ref 1.7–7.7)
Neutrophils Relative %: 70 %
Platelets: 249 10*3/uL (ref 150–400)
RBC: 4.29 MIL/uL (ref 4.22–5.81)
RDW: 12.4 % (ref 11.5–15.5)
WBC: 10.2 10*3/uL (ref 4.0–10.5)
nRBC: 0 % (ref 0.0–0.2)

## 2021-07-12 LAB — COMPREHENSIVE METABOLIC PANEL
ALT: 8 U/L (ref 0–44)
AST: 13 U/L — ABNORMAL LOW (ref 15–41)
Albumin: 3.9 g/dL (ref 3.5–5.0)
Alkaline Phosphatase: 99 U/L (ref 38–126)
Anion gap: 8 (ref 5–15)
BUN: 22 mg/dL (ref 8–23)
CO2: 26 mmol/L (ref 22–32)
Calcium: 9.4 mg/dL (ref 8.9–10.3)
Chloride: 105 mmol/L (ref 98–111)
Creatinine, Ser: 1.93 mg/dL — ABNORMAL HIGH (ref 0.61–1.24)
GFR, Estimated: 36 mL/min — ABNORMAL LOW (ref >60)
Glucose, Bld: 91 mg/dL (ref 70–99)
Potassium: 4.4 mmol/L (ref 3.5–5.1)
Sodium: 139 mmol/L (ref 135–145)
Total Bilirubin: 0.5 mg/dL (ref 0.3–1.2)
Total Protein: 7.7 g/dL (ref 6.5–8.1)

## 2021-07-12 LAB — FERRITIN: Ferritin: 78 ng/mL (ref 24–336)

## 2021-07-19 ENCOUNTER — Other Ambulatory Visit: Payer: Self-pay

## 2021-07-19 ENCOUNTER — Inpatient Hospital Stay (HOSPITAL_COMMUNITY): Payer: Medicare HMO | Admitting: Nurse Practitioner

## 2021-07-19 VITALS — HR 80 | Temp 97.8°F | Resp 20 | Wt 155.3 lb

## 2021-07-19 DIAGNOSIS — C679 Malignant neoplasm of bladder, unspecified: Secondary | ICD-10-CM

## 2021-07-19 DIAGNOSIS — R932 Abnormal findings on diagnostic imaging of liver and biliary tract: Secondary | ICD-10-CM | POA: Diagnosis not present

## 2021-07-19 DIAGNOSIS — D500 Iron deficiency anemia secondary to blood loss (chronic): Secondary | ICD-10-CM

## 2021-07-19 NOTE — Progress Notes (Signed)
[place] [address] [place], [place] [address]   Virtual Visit Progress Note  I connected with Jeffery Ponce on 07/19/21 at  2:40 PM EDT by video enabled telemedicine visit and verified that I am speaking with the correct person using two identifiers.   I discussed the limitations, risks, security and privacy concerns of performing an evaluation and management service by telemedicine and the availability of in-person appointments. I also discussed with the patient that there may be a patient responsible charge related to this service. The patient expressed understanding and agreed to proceed.   Other persons participating in the visit and their role in the encounter: RN, NP, Patient  Patient's location: [facility]  Provider's location: [facility]   CLINIC:  Medical Oncology/Hematology  PCP:  [facility] [place] / [place] [address] [phone]   REASON FOR VISIT:  Follow-up for bladder cancer & normocytic anemia  PRIOR THERAPY:  1. Cisplatin and gemcitabine x 4 cycles from 12/30/2016 to 04/21/2017. 2. Cystoprostatectomy with ileal conduit on 07/30/2017.  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 Correlate with tissue sampling on cystoscopy.   No findings suspicious for upper tract disease.   Cholelithiasis, without associated inflammatory changes.   11/19/2016 Procedure   TURBT by [name] of a 2 centimeter anterior bladder wall tumor, placement of epirubicin intravesical   11/19/2016 Procedure   50 milligrams of epirubicin and 50 mL of diluent.  This was left indwelling for 1 hour by [name]   11/22/2016 Pathology  Results   Bladder, transurethral resection, bladder tumor INFILTRATING HIGH GRADE UROTHELIAL CARCINOMA THE CARCINOMA INVADES MUSCULARIS PROPRIA (DETRUSOR MUSCLE) Microscopic Comment The neoplasm stains positive for high molecular weight cytokeratin, p63 and negative for prostein. The immunostain pattern supports the diagnosis of urothelial carcinoma.   12/18/2016 Imaging   Bilateral interstitial prominence. Active pneumonitis cannot be excluded. These changes could be also be related chronic interstitial lung disease.   12/30/2016 - 04/28/2017 Chemotherapy   The patient had palonosetron (ALOXI) injection 0.25 mg, 0.25 mg, Intravenous,  Once, 4 of 4 cycles  CISplatin (PLATINOL) 138 mg in sodium chloride 0.9 % 500 mL chemo infusion, 70 mg/m2 = 138 mg, Intravenous,  Once, 4 of 4 cycles  gemcitabine (GEMZAR) 1,976 mg in sodium chloride 0.9 % 250 mL chemo infusion, 1,000 mg/m2 = 1,976 mg, Intravenous,  Once, 4 of 4 cycles Dose modification: 800 mg/m2 (80 % of original dose 1,000 mg/m2, Cycle 3, Reason: Provider Judgment, Comment: Thrombocytopenia)  fosaprepitant (EMEND) 150 mg, dexamethasone (DECADRON) 12 mg in sodium chloride 0.9 % 145 mL IVPB, , Intravenous,  Once, 4 of 4 cycles  for chemotherapy treatment.     02/17/2017 Treatment Plan Change   Treatment deferred x 7 days due to thrombocytopenia   02/17/2017 Treatment Plan Change   Gemcitabine dose reduced by 20% due to thrombocytopenia   03/10/2017 Treatment Plan Change   Cisplatin dose-reduced by 20%   04/28/2017 Treatment Plan Change   Day 15 of cycle #4 is cancelled due to thrombocytopenia (68,000).   05/02/2017 Imaging   CT abd/pelvis at [facility]-no evidence of metastatic disease or recurrent focal urothelial lesion on noncontrast imaging.  There is  mild diffuse bladder wall thickening.  Urothelial evaluation limited without contrast.  Cholelithiasis, sigmoid diverticulosis, aortic atherosclerosis.   07/30/2017 Definitive  Surgery   Cystoprostatectomy by [name] with curative intent.   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 Several mildly enlarged lymph nodes are present including a 1.2 cm peripancreatic lymph node and a 1.4 cm low-density left external iliac lymph node or postoperative fluid collection. These merit surveillance. 2. A right middle lobe subpleural nodule along the minor fissure as a volume of 100 cubic mm. This level has not been included in prior cross-sectional imaging to assess for stability. Guidelines for follow up do not specifically apply due to history of malignancy. Well likely a benign subpleural lymph node, surveillance is likely warranted. 3. Other imaging findings of potential clinical significance: Aortic Atherosclerosis (ICD10-I70.0) and Emphysema (ICD10-J43.9). Coronary atherosclerosis with mitral and aortic valve calcification. Borderline appearance for distal esophageal wall thickening, query reflux. Several small left lower lobe  nodules in the 3-4 mm range are probably inflammatory. Cholelithiasis. Cystoprostatectomy with loop ileostomy. Sigmoid diverticulosis. Notable common and external iliac stenosis bilaterally due to atherosclerosis. Lumbar impingement at L2- 3, L3-4, and L4-5.     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 male, returns for labs and follow up for his history of bladder cancer and anemia. He overall feels well. Denies any neurologic complaints. Denies recent fevers or illnesses. Denies any easy bleeding or bruising. No melena or hematochezia. Reports good appetite and denies weight loss. Denies chest pain. Denies any nausea, vomiting, constipation, or diarrhea. Denies urinary complaints. Patient offers no further specific complaints today.   REVIEW OF SYSTEMS:  Review of Systems  Constitutional:  Positive for fatigue. Negative for appetite change and unexpected weight change.  HENT:   Negative for mouth sores, sore throat and trouble swallowing.   Respiratory:  Negative for chest tightness and shortness of breath.   Cardiovascular:  Negative for leg swelling.  Gastrointestinal:  Negative for abdominal pain, constipation, diarrhea, nausea and vomiting.  Genitourinary:  Negative for bladder incontinence and dysuria.   Musculoskeletal:  Negative for flank pain and neck stiffness.  Skin:  Negative for itching, rash and wound.  Neurological:  Negative for  dizziness, headaches, light-headedness and numbness.  Psychiatric/Behavioral:  Positive for sleep  disturbance. Negative for confusion and depression. The patient is not nervous/anxious.    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[place]) 12/10/2016   Past Surgical History:  Procedure Laterality Date   BIOPSY  11/13/2018   Procedure: BIOPSY;  Surgeon: Danie Binder, MD;  [facility];  Service: Endoscopy;;  gastric bx's   BIOPSY  12/22/2019   Procedure: BIOPSY;  Surgeon: Rogene Houston, MD;  [facility];  Service: Endoscopy;;  gastric   CARDIAC CATHETERIZATION     COLONOSCOPY WITH PROPOFOL N/A 11/14/2018   Procedure: COLONOSCOPY WITH PROPOFOL;  Surgeon: Danie Binder, MD;  [facility];  Service: Endoscopy;  Laterality: N/A;   CYSTOSCOPY WITH INJECTION N/A 07/30/2017   Procedure: CYSTOSCOPY WITH INJECTION OF INDOCYANINE GREEN DYE;  Surgeon: Alexis Frock, MD;  Location: [facility];  Service: Urology;  Laterality: N/A;   ESOPHAGOGASTRODUODENOSCOPY N/A 12/22/2019   Procedure: ESOPHAGOGASTRODUODENOSCOPY (EGD);  Surgeon: Rogene Houston, MD;  [facility];  Service: Endoscopy;  Laterality: N/A;  1:00   ESOPHAGOGASTRODUODENOSCOPY (EGD) WITH PROPOFOL N/A 11/13/2018   Procedure: ESOPHAGOGASTRODUODENOSCOPY (EGD) WITH PROPOFOL;  Surgeon: Danie Binder, MD;  [facility];  Service: Endoscopy;  Laterality: N/A;   FEMUR FRACTURE SURGERY Right    GIVENS CAPSULE STUDY N/A 10/29/2019   Procedure: GIVENS CAPSULE STUDY;  Surgeon: Rogene Houston, MD;  [facility];  Service: Endoscopy;  Laterality: N/A;  730am   GIVENS CAPSULE STUDY N/A 12/22/2019   Procedure: GIVENS CAPSULE STUDY;  Surgeon: Rogene Houston, MD;   [facility];  Service: Endoscopy;  Laterality: N/A;   LOWER EXTREMITY ANGIOGRAPHY Right 10/19/2018   Procedure: LOWER EXTREMITY ANGIOGRAPHY;  Surgeon: Algernon Huxley, MD;  Location: [facility];  Service: Cardiovascular;  Laterality: Right;   MCT 3D RECONSTRUCTION ([facility] HX) Left    arm   open reduction and internal fixation leg Right    hip and leg.   POLYPECTOMY  11/14/2018   Procedure: POLYPECTOMY;  Surgeon: Danie Binder, MD;  [facility];  Service: Endoscopy;;   PORT-A-CATH REMOVAL N/A 07/20/2018   Procedure: REMOVAL PORT-A-CATH;  Surgeon: Aviva Signs, MD;  Location: [facility];  Service: General;  Laterality: N/A;   PORTACATH PLACEMENT N/A 12/23/2016   Procedure: PLACEMENT OF TUNNELED CENTRAL VENOUS CATHETER RIGHT INTERNAL JUGULAR WITH SUBCUTANEOUS PORT;  Surgeon: Vickie Epley, MD;  Location: [facility];  Service: Vascular;  Laterality: N/A;   reattatchment of left arm     from [place] 11/19/2016   Procedure: TRANSURETHRAL RESECTION OF BLADDER TUMOR (TURBT) WITH EPIRUBICIN INJECTION;  Surgeon: Franchot Gallo, MD;  Location: [facility];  Service: Urology;  Laterality: N/A;    SOCIAL HISTORY:  Social History   Socioeconomic History   Marital status: Married    Spouse name: Not on file   Number of children: Not on file   Years of education: Not on file   Highest education level: Not on file  Occupational History   Not on file  Tobacco Use   Smoking status: Former    Packs/day: 0.50    Years: 50.00    Pack years: 25.00    Types: Cigarettes    Quit date: 10/30/2018    Years since quitting: 2.7   Smokeless tobacco: Never  Vaping Use   Vaping Use: Never used  Substance and Sexual Activity   Alcohol use: No   Drug use: No   Sexual activity: Never    Birth control/protection: None    Comment: married-30 years-2 children by first wife  Other Topics Concern   Not on file  Social History Narrative    Not on file   Social Determinants of Health   Financial Resource Strain: Medium Risk   Difficulty of Paying Living Expenses: Somewhat hard  Food Insecurity: No Food Insecurity   Worried About Charity fundraiser in the Last Year: Never true   Ran Out of Food in the Last Year: Never true  Transportation Needs: No Transportation Needs   Lack of Transportation (Medical): No   Lack of Transportation (Non-Medical): No  Physical Activity: Sufficiently Active   Days of Exercise per Week: 7 days   Minutes of Exercise per Session: 60 min  Stress: Stress Concern Present   Feeling of Stress : To some extent  Social Connections: Moderately Integrated   Frequency of Communication with Friends and Family: Three times a week   Frequency of Social Gatherings with Friends and Family: Three times a week   Attends Religious Services: More than 4 times per year   Active Member of Clubs or Organizations: No   Attends Archivist Meetings: Never   Marital Status: Married  Human resources officer Violence: Not At Risk   Fear of Current or Ex-Partner: No   Emotionally Abused: No   Physically Abused: No   Sexually Abused: No    FAMILY HISTORY:  Family History  Problem Relation Age of Onset   Aneurysm Mother    Diabetes Father    Colon cancer Brother     CURRENT MEDICATIONS:  Current Outpatient Medications  Medication Sig Dispense Refill   albuterol (VENTOLIN HFA) 108 (90 Base) MCG/ACT inhaler Inhale 1-2 puffs into the lungs every 6 (six) hours as needed.     amLODipine (NORVASC) 5 MG tablet Take 5 mg by mouth daily.     cholecalciferol (VITAMIN D) 25 MCG (1000 UT) tablet Take 1,000 Units by mouth daily.   4   FERREX 150 150 MG capsule Take 150 mg by mouth daily.     GNP ASPIRIN LOW DOSE 81 MG EC tablet TAKE 1 TABLET BY MOUTH ONCE DAILY. (Patient taking differently: Take 81 mg by mouth daily.) 30 tablet 11   hydrochlorothiazide (HYDRODIURIL) 12.5 MG tablet Take 1 tablet by mouth daily.      lisinopril (ZESTRIL) 2.5 MG tablet Take by mouth.     mirtazapine (REMERON) 30 MG tablet Take 1 tablet by mouth at bedtime.     morphine (MS CONTIN) 15 MG 12 hr tablet Take 15 mg by mouth 2 (two) times daily as needed for pain.     Oxycodone HCl 10 MG TABS Take 1-2 tablets (10-20 mg total) by mouth every 4 (four) hours as needed (pain). Post-operatively 40 tablet 0   polyethylene glycol (MIRALAX) packet Take 17 g by mouth daily. 28 each 1   sodium bicarbonate 650 MG tablet Take 650 mg by mouth 2 (two) times daily.     sodium zirconium cyclosilicate (LOKELMA) 10 g PACK packet  Take 1 packet by mouth daily.     atorvastatin (LIPITOR) 10 MG tablet Take 1 tablet (10 mg total) by mouth daily. (Patient not taking: Reported on 01/29/2021) 30 tablet 11   No current facility-administered medications for this visit.    ALLERGIES:  Allergies  Allergen Reactions   Penicillins Anaphylaxis and Other (See Comments)    Has patient had a PCN reaction causing immediate rash, facial/tongue/throat swelling, SOB or lightheadedness with hypotension:Yes Has patient had a PCN reaction causing severe rash involving mucus membranes or skin necrosis:Yes Has patient had a PCN reaction that required hospitalization:No Has patient had a PCN reaction occurring within the last 10 years:No If all of the above answers are "NO", then may proceed with Cephalosporin use.    Ciprofloxacin Itching    Itching at IV site. No hives or shortness of breathe.   Statins Other (See Comments)   Ambien [Zolpidem Tartrate] Other (See Comments)    "Sleep walking"   Oxycodone Nausea Only   Zolpidem     Other reaction(s): Other (see comments) "Sleep walking"    PHYSICAL EXAM:  Performance status (ECOG): 1 - Symptomatic but completely ambulatory  Vitals:   07/19/21 1400  Pulse: 80  Resp: 20  Temp: 97.8 F (36.6 C)  SpO2: 99%   Wt Readings from Last 3 Encounters:  07/19/21 155 lb 4.8 oz (70.4 kg)  02/01/21 163 lb (73.9 kg)   01/29/21 163 lb 12.8 oz (74.3 kg)    Physical Exam Vitals reviewed.  Constitutional:      Appearance: He is not ill-appearing.  Pulmonary:     Effort: No respiratory distress.  Skin:    Coloration: Skin is not pale.  Neurological:     Mental Status: He is alert and oriented to person, place, and time.  Psychiatric:        Mood and Affect: Mood normal.        Behavior: Behavior normal.    LABORATORY DATA:  I have reviewed the labs as listed.  CBC Latest Ref Rng & Units 07/12/2021 01/10/2021 06/20/2020  WBC 4.0 - 10.5 K/uL 10.2 10.1 8.1  Hemoglobin 13.0 - 17.0 g/dL 12.8(L) 13.8 12.3(L)  Hematocrit 39.0 - 52.0 % 41.0 43.4 39.8  Platelets 150 - 400 K/uL 249 300 210   CMP Latest Ref Rng & Units 07/12/2021 01/10/2021 06/20/2020  Glucose 70 - 99 mg/dL 91 97 143(H)  BUN 8 - 23 mg/dL 22 27(H) 23  Creatinine 0.61 - 1.24 mg/dL 1.93(H) 1.90(H) 2.00(H)  Sodium 135 - 145 mmol/L 139 139 139  Potassium 3.5 - 5.1 mmol/L 4.4 4.2 4.3  Chloride 98 - 111 mmol/L 105 106 104  CO2 22 - 32 mmol/L '26 25 24  '$ Calcium 8.9 - 10.3 mg/dL 9.4 9.5 8.7(L)  Total Protein 6.5 - 8.1 g/dL 7.7 7.7 6.9  Total Bilirubin 0.3 - 1.2 mg/dL 0.5 0.4 0.5  Alkaline Phos 38 - 126 U/L 99 118 116  AST 15 - 41 U/L 13(L) 21 20  ALT 0 - 44 U/L '8 26 23   '$ Lab Results  Component Value Date   TIBC 269 07/12/2021   TIBC 413 01/10/2021   TIBC 251 06/20/2020   FERRITIN 78 07/12/2021   FERRITIN 18 (L) 01/10/2021   FERRITIN 87 06/20/2020   IRONPCTSAT 12 (L) 07/12/2021   IRONPCTSAT 12 (L) 01/10/2021   IRONPCTSAT 19 06/20/2020    DIAGNOSTIC IMAGING:  I have independently reviewed the scans and discussed with the patient. CT ABDOMEN PELVIS WO CONTRAST  Result Date: 07/13/2021 CLINICAL DATA:  Follow-up bladder cancer, surveillance, status post chemotherapy and cystoprostatectomy EXAM: CT ABDOMEN AND PELVIS WITHOUT CONTRAST TECHNIQUE: Multidetector CT imaging of the abdomen and pelvis was performed following the standard protocol  without IV contrast. COMPARISON:  01/10/2021 FINDINGS: Lower chest: No acute abnormality. Emphysema. Coronary artery calcifications. Hepatobiliary: Stable small cyst of the anterior right lobe of the liver (series 2, image 26). No solid liver abnormality is seen. Status post cholecystectomy. No biliary dilatation. Pancreas: Unremarkable. No pancreatic ductal dilatation or surrounding inflammatory changes. Spleen: Normal in size without significant abnormality. Adrenals/Urinary Tract: Adrenal glands are unremarkable. Kidneys are normal, without renal calculi, solid lesion, or hydronephrosis. Status post cystoprostatectomy and right lower quadrant ileal conduit urinary diversion. Stomach/Bowel: Stomach is within normal limits. Appendix is not clearly visualized and may be surgically absent. No evidence of bowel wall thickening, distention, or inflammatory changes. Sigmoid diverticulosis. Vascular/Lymphatic: Aortic atherosclerosis. Bilateral iliac artery stents. No enlarged abdominal or pelvic lymph nodes. Reproductive: Status post cystoprostatectomy. Other: No abdominal wall hernia or abnormality. No abdominopelvic ascites. Musculoskeletal: No acute or significant osseous findings. Partially imaged intramedullary rod fixation of the right femur. IMPRESSION: 1. Status post cystoprostatectomy and right lower quadrant ileal conduit urinary diversion. 2. No noncontrast evidence of malignant recurrence or metastatic disease in the abdomen or pelvis. 3. No hydronephrosis. Aortic Atherosclerosis (ICD10-I70.0). Electronically Signed   By: Eddie Candle M.D.   On: 07/13/2021 20:17      ASSESSMENT:  1.  Normocytic anemia: -Combination anemia from CKD and IDA. -Receives intermittent parenteral iron therapy.  Last Venofer was on 01/05/2020. -EGD on 12/22/2019 showed normal esophagus, multiple petechial hemorrhages noted at gastric body and antrum.  Normal duodenal bulb and second part of duodenum. -Capsule study on 12/22/2019  showed multiple petechial hemorrhages involving gastric mucosa, fresh blood noted on images likely from trauma as the capsule forced across the pylorus.  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  Dark black liquid noted in the distal small bowel likely due to iron.  2.  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 -As there was suspicion for muscle invasion, underwent cisplatin and gemcitabine 4 cycles from 12/30/2016 through 04/21/2017 followed by cystoprostatectomy and ileal conduit on 07/30/2017, showing residual disease of 0.4 cm. -CT CAP on 06/20/2020 with no findings of recurrence or metastatic disease.  Hepatic morphology with mild cirrhosis cannot be excluded.  Similar bilateral pulmonary nodules, favoring benign etiology.  Slight enlargement of mediastinal node which is not pathologic considered reactive.   PLAN:  1.  Normocytic anemia: - labs reviewed: hemoglobin 12.8. Ferritin pending at time of visit but improved to 78, iron saturation 12%. Folic acid and [id number] were previously normal.  - he is s/p venofer 300 mg x 3  - still symptomatic though improving - plan for venofer x 2 - rtc in 6 months for labs then day to week later see Dr. Raliegh Ip for possible venofer - follow up with GI for IDA  2.  Bladder cancer: - Reviewed imaging from 07/12/21 which was negative for evidence of recurrent or metastatic disease.  - he continues to be asymptomatic - continue surveillance  3. Abnormal abdominal ultrasound - previous ct showed incidental finding of possible cirrhosis. Denies history of nepatitis. Hx of alcohol abuse in the past. We reviewed ultrasound results from 02/06/21 including result note from Dr. Catalina Lunger. Patient had declined liver biopsy and plan is to get liver ultrasound and AFP every 6 months. Patient with contact GI to schedule appt.  Disposition: Venofer x 2 Rtc in 6 months for labs (cbc, cmp, ferritin, iron studies). See MD a few days to week later   Orders placed this  encounter:  Orders Placed This Encounter  Procedures   CBC with Differential/Platelet   Comprehensive metabolic panel   Ferritin   Iron and TIBC    I discussed the assessment and treatment plan with the patient. The patient was provided an opportunity to ask questions and all were answered. The patient agreed with the plan and demonstrated an understanding of the instructions.   The patient was advised to call back or seek an in-person evaluation if the symptoms worsen or if the condition fails to improve as anticipated.   I spent 20 minutes face-to-face video visit time dedicated to the care of this patient on the date of this encounter to include pre-visit review of medical oncology notes, GI notes from March visit, ultrasound, CT review, review of labs, face-to-face time with the patient, and post visit ordering of testing/documentation.   Beckey Rutter, DNP, AGNP-C [place] [phone]

## 2021-07-19 NOTE — Patient Instructions (Addendum)
[place] at [facility] Discharge Instructions  You were seen today by Beckey Rutter, NP. She discussed your most recent lab results. Your lab results look okay. She discussed your ultrasound results and it shows an abnormality in your liver. Keep your follow up with the gastrointestinal doctor and he will perform an ultrasound and bloodwork. If you continue to lose weight and feel weak she recommends you to do the biopsy on your liver. She is going to schedule you to have a couple more doses of iron. This should help your iron levels to continue to improve.   Please follow up as scheduled.   Thank you for choosing [facility] at [facility] to provide your oncology and hematology care.  To afford each patient quality time with our provider, please arrive at least 15 minutes before your scheduled appointment time.   If you have a lab appointment with the [facility] please come in thru the Main Entrance and check in at the main information desk.  You need to re-schedule your appointment should you arrive 10 or more minutes late.  We strive to give you quality time with our providers, and arriving late affects you and other patients whose appointments are after yours.  Also, if you no show three or more times for appointments you may be dismissed from the clinic at the providers discretion.     Again, thank you for choosing [facility].  Our hope is that these requests will decrease the amount of time that you wait before being seen by our physicians.       _____________________________________________________________  Should you have questions after your visit to [facility], please contact our office at [phone] and follow the prompts.  Our office hours are 8:00 a.m. and 4:30 p.m. Monday - Friday.  Please note that voicemails left after 4:00 p.m. may not be returned until the following business day.  We are closed  weekends and major holidays.  You do have access to a nurse 24-7, just call the main number to the clinic [phone] and do not press any options, hold on the line and a nurse will answer the phone.    For prescription refill requests, have your pharmacy contact our office and allow 72 hours.    Due to Covid, you will need to wear a mask upon entering the hospital. If you do not have a mask, a mask will be given to you at the Main Entrance upon arrival. For doctor visits, patients may have 1 support person age 80 or older with them. For treatment visits, patients can not have anyone with them due to social distancing guidelines and our immunocompromised population.

## 2021-07-20 ENCOUNTER — Encounter (HOSPITAL_COMMUNITY): Payer: Self-pay | Admitting: Hematology

## 2021-07-24 ENCOUNTER — Inpatient Hospital Stay (HOSPITAL_COMMUNITY): Payer: Medicare HMO

## 2021-07-24 ENCOUNTER — Other Ambulatory Visit: Payer: Self-pay

## 2021-07-24 VITALS — BP 131/51 | HR 67 | Temp 96.9°F | Resp 18 | Wt 158.8 lb

## 2021-07-24 DIAGNOSIS — D649 Anemia, unspecified: Secondary | ICD-10-CM

## 2021-07-24 DIAGNOSIS — Z8551 Personal history of malignant neoplasm of bladder: Secondary | ICD-10-CM | POA: Diagnosis not present

## 2021-07-24 DIAGNOSIS — D500 Iron deficiency anemia secondary to blood loss (chronic): Secondary | ICD-10-CM

## 2021-07-24 MED ORDER — SODIUM CHLORIDE 0.9 % IV SOLN
200.0000 mg | INTRAVENOUS | Status: DC
  Administered 2021-07-24: 200 mg via INTRAVENOUS
  Filled 2021-07-24: qty 200

## 2021-07-24 MED ORDER — SODIUM CHLORIDE 0.9 % IV SOLN: Freq: Once | INTRAVENOUS | Status: AC

## 2021-07-24 MED ORDER — ACETAMINOPHEN 325 MG PO TABS
650.0000 mg | ORAL_TABLET | Freq: Once | ORAL | Status: AC
  Administered 2021-07-24: 650 mg via ORAL
  Filled 2021-07-24: qty 2

## 2021-07-24 MED ORDER — LORATADINE 10 MG PO TABS
10.0000 mg | ORAL_TABLET | Freq: Once | ORAL | Status: AC
  Administered 2021-07-24: 10 mg via ORAL
  Filled 2021-07-24: qty 1

## 2021-07-24 MED ORDER — FAMOTIDINE 20 MG PO TABS
20.0000 mg | ORAL_TABLET | Freq: Once | ORAL | Status: AC
  Administered 2021-07-24: 20 mg via ORAL
  Filled 2021-07-24: qty 1

## 2021-07-24 NOTE — Progress Notes (Signed)
Iron infusion given per orders. Patient tolerated it well without problems. Vitals stable and discharged home from clinic ambulatory. Follow up as scheduled.  

## 2021-07-24 NOTE — Patient Instructions (Signed)
[place]  Discharge Instructions: Thank you for choosing [place] [facility] to provide your oncology and hematology care.  If you have a lab appointment with the [facility], please come in thru the Main Entrance and check in at the main information desk.    We strive to give you quality time with your provider. You may need to reschedule your appointment if you arrive late (15 or more minutes).  Arriving late affects you and other patients whose appointments are after yours.  Also, if you miss three or more appointments without notifying the office, you may be dismissed from the clinic at the provider's discretion.      For prescription refill requests, have your pharmacy contact our office and allow 72 hours for refills to be completed.       Items with * indicate a potential emergency and should be followed up as soon as possible or go to the Emergency Department if any problems should occur.    Should you have questions after your visit or need to cancel or reschedule your appointment, please contact [facility] [phone]  and follow the prompts.  Office hours are 8:00 a.m. to 4:30 p.m. Monday - Friday. Please note that voicemails left after 4:00 p.m. may not be returned until the following business day.  We are closed weekends and major holidays. You have access to a nurse at all times for urgent questions. Please call the main number to the clinic [phone] and follow the prompts.  For any non-urgent questions, you may also contact your provider using MyChart. We now offer e-Visits for anyone 56 and older to request care online for non-urgent symptoms. For details visit [email or web address].   Also download the MyChart app! Go to the app store, search "MyChart", open the app, select [place], and log in with your MyChart username and password.  Due to Covid, a mask is required upon entering the hospital/clinic. If you do not have a mask,  one will be given to you upon arrival. For doctor visits, patients may have 1 support person aged 91 or older with them. For treatment visits, patients cannot have anyone with them due to current Covid guidelines and our immunocompromised population.

## 2021-08-01 ENCOUNTER — Other Ambulatory Visit: Payer: Self-pay

## 2021-08-01 ENCOUNTER — Encounter (HOSPITAL_COMMUNITY): Payer: Self-pay

## 2021-08-01 ENCOUNTER — Inpatient Hospital Stay (HOSPITAL_COMMUNITY): Payer: Medicare HMO

## 2021-08-01 VITALS — BP 134/52 | HR 65 | Temp 97.0°F | Resp 17 | Wt 156.8 lb

## 2021-08-01 DIAGNOSIS — D500 Iron deficiency anemia secondary to blood loss (chronic): Secondary | ICD-10-CM

## 2021-08-01 DIAGNOSIS — D649 Anemia, unspecified: Secondary | ICD-10-CM

## 2021-08-01 DIAGNOSIS — Z8551 Personal history of malignant neoplasm of bladder: Secondary | ICD-10-CM | POA: Diagnosis not present

## 2021-08-01 MED ORDER — FAMOTIDINE 20 MG PO TABS
20.0000 mg | ORAL_TABLET | Freq: Once | ORAL | Status: AC
  Administered 2021-08-01: 20 mg via ORAL
  Filled 2021-08-01: qty 1

## 2021-08-01 MED ORDER — SODIUM CHLORIDE 0.9 % IV SOLN: Freq: Once | INTRAVENOUS | Status: AC

## 2021-08-01 MED ORDER — LORATADINE 10 MG PO TABS
10.0000 mg | ORAL_TABLET | Freq: Once | ORAL | Status: AC
  Administered 2021-08-01: 10 mg via ORAL
  Filled 2021-08-01: qty 1

## 2021-08-01 MED ORDER — ACETAMINOPHEN 325 MG PO TABS
650.0000 mg | ORAL_TABLET | Freq: Once | ORAL | Status: AC
  Administered 2021-08-01: 650 mg via ORAL
  Filled 2021-08-01: qty 2

## 2021-08-01 MED ORDER — SODIUM CHLORIDE 0.9 % IV SOLN
200.0000 mg | INTRAVENOUS | Status: DC
  Administered 2021-08-01: 200 mg via INTRAVENOUS
  Filled 2021-08-01: qty 200

## 2021-08-01 NOTE — Progress Notes (Signed)
Tolerated venofer infusion well without incidence today.  Discharged in stable condition ambulatory.  Stable during and after infusion.  Vital signs stable prior to discharge.  AVS reviewed.

## 2021-08-01 NOTE — Patient Instructions (Signed)
[place]  Discharge Instructions: Thank you for choosing [place] to provide your oncology and hematology care.  If you have a lab appointment with the [place], please come in thru the Main Entrance and check in at the main information desk.  Wear comfortable clothing and clothing appropriate for easy access to any Portacath or PICC line.   We strive to give you quality time with your provider. You may need to reschedule your appointment if you arrive late (15 or more minutes).  Arriving late affects you and other patients whose appointments are after yours.  Also, if you miss three or more appointments without notifying the office, you may be dismissed from the clinic at the provider's discretion.      For prescription refill requests, have your pharmacy contact our office and allow 72 hours for refills to be completed.    Today you received the following chemotherapy and/or immunotherapy agents venofer      To help prevent nausea and vomiting after your treatment, we encourage you to take your nausea medication as directed.  BELOW ARE SYMPTOMS THAT SHOULD BE REPORTED IMMEDIATELY: *FEVER GREATER THAN 100.4 F (38 C) OR HIGHER *CHILLS OR SWEATING *NAUSEA AND VOMITING THAT IS NOT CONTROLLED WITH YOUR NAUSEA MEDICATION *UNUSUAL SHORTNESS OF BREATH *UNUSUAL BRUISING OR BLEEDING *URINARY PROBLEMS (pain or burning when urinating, or frequent urination) *BOWEL PROBLEMS (unusual diarrhea, constipation, pain near the anus) TENDERNESS IN MOUTH AND THROAT WITH OR WITHOUT PRESENCE OF ULCERS (sore throat, sores in mouth, or a toothache) UNUSUAL RASH, SWELLING OR PAIN  UNUSUAL VAGINAL DISCHARGE OR ITCHING   Items with * indicate a potential emergency and should be followed up as soon as possible or go to the Emergency Department if any problems should occur.  Please show the CHEMOTHERAPY ALERT CARD or IMMUNOTHERAPY ALERT CARD at check-in to the Emergency  Department and triage nurse.  Should you have questions after Iron Sucrose injection What is this medication? IRON SUCROSE (AHY ern SOO krohs) is an iron complex. Iron is used to make healthy red blood cells, which carry oxygen and nutrients throughout the body. This medicine is used to treat iron deficiency anemia in people with chronickidney disease. This medicine may be used for other purposes; ask your health care provider orpharmacist if you have questions. COMMON BRAND NAME(S): Venofer What should I tell my care team before I take this medication? They need to know if you have any of these conditions: anemia not caused by low iron levels heart disease high levels of iron in the blood kidney disease liver disease an unusual or allergic reaction to iron, other medicines, foods, dyes, or preservatives pregnant or trying to get pregnant breast-feeding How should I use this medication? This medicine is for infusion into a vein. It is given by a health careprofessional in a hospital or clinic setting. Talk to your pediatrician regarding the use of this medicine in children. While this drug may be prescribed for children as young as 2 years for selectedconditions, precautions do apply. Overdosage: If you think you have taken too much of this medicine contact apoison control center or emergency room at once. NOTE: This medicine is only for you. Do not share this medicine with others. What if I miss a dose? It is important not to miss your dose. Call your doctor or health careprofessional if you are unable to keep an appointment. What may interact with this medication? Do not take this medicine with any  of the following medications: deferoxamine dimercaprol other iron products This medicine may also interact with the following medications: chloramphenicol deferasirox This list may not describe all possible interactions. Give your health care provider a list of all the medicines, herbs,  non-prescription drugs, or dietary supplements you use. Also tell them if you smoke, drink alcohol, or use illegaldrugs. Some items may interact with your medicine. What should I watch for while using this medication? Visit your doctor or healthcare professional regularly. Tell your doctor or healthcare professional if your symptoms do not start to get better or if theyget worse. You may need blood work done while you are taking this medicine. You may need to follow a special diet. Talk to your doctor. Foods that contain iron include: whole grains/cereals, dried fruits, beans, or peas, leafy greenvegetables, and organ meats (liver, kidney). What side effects may I notice from receiving this medication? Side effects that you should report to your doctor or health care professionalas soon as possible: allergic reactions like skin rash, itching or hives, swelling of the face, lips, or tongue breathing problems changes in blood pressure cough fast, irregular heartbeat feeling faint or lightheaded, falls fever or chills flushing, sweating, or hot feelings joint or muscle aches/pains seizures swelling of the ankles or feet unusually weak or tired Side effects that usually do not require medical attention (report to yourdoctor or health care professional if they continue or are bothersome): diarrhea feeling achy headache irritation at site where injected nausea, vomiting stomach upset tiredness This list may not describe all possible side effects. Call your doctor for medical advice about side effects. You may report side effects to FDA at1-800-FDA-1088. Where should I keep my medication? This drug is given in a hospital or clinic and will not be stored at home. NOTE: This sheet is a summary. It may not cover all possible information. If you have questions about this medicine, talk to your doctor, pharmacist, orhealth care provider.  2022 Elsevier/Gold Standard (2011-09-05 17:14:35) your  visit or need to cancel or reschedule your appointment, please contact [facility] [phone]  and follow the prompts.  Office hours are 8:00 a.m. to 4:30 p.m. Monday - Friday. Please note that voicemails left after 4:00 p.m. may not be returned until the following business day.  We are closed weekends and major holidays. You have access to a nurse at all times for urgent questions. Please call the main number to the clinic [phone] and follow the prompts.  For any non-urgent questions, you may also contact your provider using MyChart. We now offer e-Visits for anyone 26 and older to request care online for non-urgent symptoms. For details visit [email or web address].   Also download the MyChart app! Go to the app store, search "MyChart", open the app, select [place], and log in with your MyChart username and password.  Due to Covid, a mask is required upon entering the hospital/clinic. If you do not have a mask, one will be given to you upon arrival. For doctor visits, patients may have 1 support person aged 14 or older with them. For treatment visits, patients cannot have anyone with them due to current Covid guidelines and our immunocompromised population.

## 2021-08-16 ENCOUNTER — Ambulatory Visit (INDEPENDENT_AMBULATORY_CARE_PROVIDER_SITE_OTHER): Payer: Medicare HMO | Admitting: Gastroenterology

## 2021-11-22 ENCOUNTER — Ambulatory Visit (INDEPENDENT_AMBULATORY_CARE_PROVIDER_SITE_OTHER): Payer: Medicare HMO | Admitting: Gastroenterology

## 2021-11-22 ENCOUNTER — Encounter (INDEPENDENT_AMBULATORY_CARE_PROVIDER_SITE_OTHER): Payer: Self-pay | Admitting: Gastroenterology

## 2021-12-11 ENCOUNTER — Other Ambulatory Visit: Payer: Self-pay

## 2021-12-11 ENCOUNTER — Ambulatory Visit (INDEPENDENT_AMBULATORY_CARE_PROVIDER_SITE_OTHER): Payer: Medicare HMO | Admitting: Gastroenterology

## 2021-12-11 ENCOUNTER — Encounter (INDEPENDENT_AMBULATORY_CARE_PROVIDER_SITE_OTHER): Payer: Self-pay | Admitting: Gastroenterology

## 2021-12-11 VITALS — BP 142/81 | HR 88 | Temp 98.2°F | Ht 70.5 in | Wt 157.7 lb

## 2021-12-11 DIAGNOSIS — D500 Iron deficiency anemia secondary to blood loss (chronic): Secondary | ICD-10-CM

## 2021-12-11 DIAGNOSIS — R932 Abnormal findings on diagnostic imaging of liver and biliary tract: Secondary | ICD-10-CM | POA: Diagnosis not present

## 2021-12-11 NOTE — Progress Notes (Signed)
Referring Provider: [facility]* Primary Care Physician:  [facility] Primary GI Physician: Jenetta Downer  Chief Complaint  Patient presents with   Follow-up    Patient here today for a follow up on IDA due to chronic blood loss. He states he is having some weakness. He denies any dark or bloody stools, dizziness or shortness of breath. Has had IV iron no longer po fe.   HPI:   Jeffery Ponce is a 76 y.o. 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  Patient presenting today for follow up of IDA, last seen 01/29/21.   Hgb 11/20/21 was 13.4, iron was 51, TIBC 254, saturation 20. he is also followed by Dr. Raliegh Ip with hematology/oncology and receives IV iron infusions. Last iron infusion was about 4 months ago. He does report that he does feel more fatigued for the last month or so. He denies any rectal bleeding or melena.   He was also noted to have incidental findings of possible liver cirrhosis on previous CT imaging, he had [place] elastography in March 2022 which was also concerning for liver cirrhosis in presence of Caudate lobe enlargement, patient declined liver biopsy at that time and requested to have routine month monitoring with [place], AFP and MELD labs. Notably his LFTs have not been previously elevated, last in august 2022 all WNL. He has had no episodes of jaundice, ascites, confusion or easy bruising.  He was having some issues with constipation secondary to chronic pain medications, he is doing 1 capful of miralax everyday with 2-3 soft stools per day but is concerned that the miralax daily is too much.  Last Colonoscopy:11/13/18 no source for melena or bleeding found, small hh, mild gastritis/duodenitis Last Endoscopy:11/14/18 single non bleeding colonic angioectasia, treated with APC, diverticulosis in recto sigmoid colon, sigmoid and descending colon, six 4-87mm polyps in rectum and  distal ascending colon, removed with cold snare, biopsies tubular adenomas, internal hemorrhoids, tortuous colon  Past Medical History:  Diagnosis Date   Arthritis    Chronic renal disease, stage 3, moderately decreased glomerular filtration rate (GFR) between 30-59 mL/min/1.73 square meter (HCC) 11/28/2017   Heart murmur    History of kidney stones    Hypercholesteremia    Urothelial carcinoma ([place]) 12/10/2016   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    Past Surgical History:  Procedure Laterality Date   BIOPSY  11/13/2018   Procedure: BIOPSY;  Surgeon: Danie Binder, MD;  [facility];  Service: Endoscopy;;  gastric bx's   BIOPSY  12/22/2019   Procedure: BIOPSY;  Surgeon: Rogene Houston, MD;  [facility];  Service: Endoscopy;;  gastric   CARDIAC CATHETERIZATION     COLONOSCOPY WITH PROPOFOL N/A 11/14/2018   Procedure: COLONOSCOPY WITH PROPOFOL;  Surgeon: Danie Binder, MD;  [facility];  Service: Endoscopy;  Laterality: N/A;   CYSTOSCOPY WITH INJECTION N/A 07/30/2017   Procedure: CYSTOSCOPY WITH INJECTION OF INDOCYANINE GREEN DYE;  Surgeon: Alexis Frock, MD;  Location: [facility];  Service: Urology;  Laterality: N/A;   ESOPHAGOGASTRODUODENOSCOPY N/A 12/22/2019   Procedure: ESOPHAGOGASTRODUODENOSCOPY (EGD);  Surgeon: Rogene Houston, MD;  [facility];  Service: Endoscopy;  Laterality: N/A;  1:00   ESOPHAGOGASTRODUODENOSCOPY (EGD) WITH PROPOFOL N/A 11/13/2018   Procedure: ESOPHAGOGASTRODUODENOSCOPY (EGD) WITH PROPOFOL;  Surgeon: Danie Binder, MD;  [facility];  Service: Endoscopy;  Laterality: N/A;   FEMUR FRACTURE SURGERY Right    GIVENS CAPSULE STUDY N/A 10/29/2019   Procedure: GIVENS CAPSULE STUDY;  Surgeon: Rogene Houston, MD;  [facility];  Service: Endoscopy;  Laterality: N/A;  730am   GIVENS CAPSULE STUDY N/A 12/22/2019   Procedure: GIVENS CAPSULE STUDY;  Surgeon: Rogene Houston, MD;  [facility];  Service: Endoscopy;  Laterality: N/A;   LOWER EXTREMITY ANGIOGRAPHY Right 10/19/2018   Procedure: LOWER EXTREMITY ANGIOGRAPHY;  Surgeon: Algernon Huxley, MD;  Location: [facility] CV LAB;  Service: Cardiovascular;  Laterality: Right;   MCT 3D RECONSTRUCTION ([facility] HX) Left    arm   open reduction and internal fixation leg Right    hip and leg.   POLYPECTOMY  11/14/2018   Procedure: POLYPECTOMY;  Surgeon: Danie Binder, MD;  [facility];  Service: Endoscopy;;   PORT-A-CATH REMOVAL N/A 07/20/2018   Procedure: REMOVAL PORT-A-CATH;  Surgeon: Aviva Signs, MD;  Location: [facility];  Service: General;  Laterality: N/A;   PORTACATH PLACEMENT N/A 12/23/2016   Procedure: PLACEMENT OF TUNNELED CENTRAL VENOUS CATHETER RIGHT INTERNAL JUGULAR WITH SUBCUTANEOUS PORT;  Surgeon: Vickie Epley, MD;  Location: [facility];  Service: Vascular;  Laterality: N/A;   reattatchment of left arm     from [place] 11/19/2016   Procedure: TRANSURETHRAL RESECTION OF BLADDER TUMOR (TURBT) WITH EPIRUBICIN INJECTION;  Surgeon: Franchot Gallo, MD;  Location: [facility];  Service: Urology;  Laterality: N/A;    Current Outpatient Medications  Medication Sig Dispense Refill   albuterol (VENTOLIN HFA) 108 (90 Base) MCG/ACT inhaler Inhale 1-2 puffs into the lungs every 6 (six) hours as needed.     amLODipine (NORVASC) 5 MG tablet Take 5 mg by mouth daily.     atorvastatin (LIPITOR) 10 MG tablet Take 1 tablet (10 mg total) by mouth daily. 30 tablet 11   cholecalciferol (VITAMIN D) 25 MCG (1000 UT) tablet Take 1,000 Units by mouth daily.   4   GNP ASPIRIN LOW DOSE 81 MG EC tablet TAKE 1 TABLET BY MOUTH ONCE DAILY. 30 tablet 11   lisinopril (ZESTRIL) 2.5 MG tablet Take 2.5 mg by mouth daily.     morphine (MS CONTIN) 15 MG 12 hr tablet Take 15 mg by mouth 2 (two) times daily as needed for pain.     OVER THE COUNTER MEDICATION B 12 once per day.     Oxycodone  HCl 10 MG TABS Take 1-2 tablets (10-20 mg total) by mouth every 4 (four) hours as needed (pain). Post-operatively 40 tablet 0   polyethylene glycol (MIRALAX) packet Take 17 g by mouth daily. 28 each 1   sodium bicarbonate 650 MG tablet Take 650 mg by mouth 2 (two) times daily.     sodium zirconium cyclosilicate (LOKELMA) 10 g PACK packet Take 1 packet by mouth 3 (three) times daily.     FERREX 150 150 MG capsule Take 150 mg by mouth daily. (Patient not taking: Reported on 12/11/2021)     hydrochlorothiazide (HYDRODIURIL) 12.5 MG tablet Take 1 tablet by mouth daily. (Patient not taking: Reported on 12/11/2021)     mirtazapine (REMERON) 30 MG tablet Take 1 tablet by mouth at bedtime. (Patient not taking: Reported on 12/11/2021)     No current facility-administered medications for this visit.    Allergies as of 12/11/2021 - Review Complete 12/11/2021  Allergen Reaction Noted   Penicillins Anaphylaxis and  Other (See Comments) 12/20/2020   Ciprofloxacin Itching 11/19/2016   Statins Other (See Comments) 12/20/2020   Ambien [zolpidem tartrate] Other (See Comments) 04/28/2017   Oxycodone Nausea Only 04/19/2020   Zolpidem  04/28/2017    Family History  Problem Relation Age of Onset   Aneurysm Mother    Diabetes Father    Colon cancer Brother     Social History   Socioeconomic History   Marital status: Married    Spouse name: Not on file   Number of children: Not on file   Years of education: Not on file   Highest education level: Not on file  Occupational History   Not on file  Tobacco Use   Smoking status: Every Day    Packs/day: 1.00    Years: 50.00    Pack years: 50.00    Types: Cigarettes    Last attempt to quit: 10/30/2018    Years since quitting: 3.1   Smokeless tobacco: Never  Vaping Use   Vaping Use: Never used  Substance and Sexual Activity   Alcohol use: No   Drug use: No   Sexual activity: Never    Birth control/protection: None    Comment: married-30 years-2  children by first wife  Other Topics Concern   Not on file  Social History Narrative   Not on file   Social Determinants of Health   Financial Resource Strain: Medium Risk   Difficulty of Paying Living Expenses: Somewhat hard  Food Insecurity: No Food Insecurity   Worried About Charity fundraiser in the Last Year: Never true   Ran Out of Food in the Last Year: Never true  Transportation Needs: No Transportation Needs   Lack of Transportation (Medical): No   Lack of Transportation (Non-Medical): No  Physical Activity: Sufficiently Active   Days of Exercise per Week: 7 days   Minutes of Exercise per Session: 60 min  Stress: Stress Concern Present   Feeling of Stress : To some extent  Social Connections: Moderately Integrated   Frequency of Communication with Friends and Family: Three times a week   Frequency of Social Gatherings with Friends and Family: Three times a week   Attends Religious Services: More than 4 times per year   Active Member of Clubs or Organizations: No   Attends Archivist Meetings: Never   Marital Status: Married   Review of systems General: negative for night sweats, fever, chills, weight loss +fatigue Neck: Negative for lumps, goiter, pain and significant neck swelling Resp: Negative for cough, wheezing, dyspnea at rest CV: Negative for chest pain, leg swelling, palpitations, orthopnea GI: denies melena, hematochezia, nausea, vomiting, diarrhea, dysphagia, odyonophagia, early satiety or unintentional weight loss. +constipation MSK: Negative for joint pain or swelling, back pain, and muscle pain. Derm: Negative for itching or rash Psych: Denies depression, anxiety, memory loss, confusion. No homicidal or suicidal ideation.  Heme: Negative for prolonged bleeding, bruising easily, and swollen nodes. Endocrine: Negative for cold or heat intolerance, polyuria, polydipsia and goiter. Neuro: negative for tremor, gait imbalance, syncope and  seizures. The remainder of the review of systems is noncontributory.  Physical Exam: BP (!) 142/81 (BP Location: Left Arm, Patient Position: Sitting, Cuff Size: Large)    Pulse 88    Temp 98.2 F (36.8 C) (Oral)    Ht 5' 10.5" (1.791 m)    Wt 157 lb 11.2 oz (71.5 kg)    BMI 22.31 kg/m  General:   Alert and oriented. No distress noted.  Pleasant and cooperative.  Head:  Normocephalic and atraumatic. Eyes:  Conjuctiva clear without scleral icterus. Mouth:  Oral mucosa pink and moist. Good dentition. No lesions. Heart: Normal rate and rhythm, s1 and s2 heart sounds present.  Lungs: Clear lung sounds in all lobes. Respirations equal and unlabored. Abdomen:  +BS, soft, non-tender and non-distended. No rebound or guarding. No HSM or masses noted. Derm: No palmar erythema or jaundice Msk:  Symmetrical without gross deformities. Normal posture. Extremities:  Without edema. Neurologic:  Alert and  oriented x4 Psych:  Alert and cooperative. Normal mood and affect.  Invalid input(s): 6 MONTHS   ASSESSMENT: LA DIBELLA is a 76 y.o. 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  IDA curently well managed with recent hgb and iron studies in Dec 2022 all WNL. Last iron infusion was 4 months ago, he is supposed to follow up with Hematology in February for continued monitoring. He has no c/o of rectal bleeding or melena. He does continue to endorse some fatigue, after reviewing EMR, B12 and Vit D were also WNL, I have encouraged him to see his PCP for possibly thyroid function testing.   Possible hepatic cirrhosis, given caudate lobe enlargement on CT A/P in February and elastography likely related to hx of alcohol abuse in the past, reassuringly LFTs have remained WNL. We will continue with 6 month monitoring with LFTs, AFP and RUQ [place]. He has had no episodes of confusion, edema or jaundice.    PLAN:  Try 1 capful of miralax every other day 2.  RUQ [place] 3. AFP and CMP 4.  See pcp regarding thyroid function  Follow Up: 6 months  Edy Belt L. Alver Sorrow, MSN, APRN, AGNP-C Adult-Gerontology Nurse Practitioner [facility]

## 2021-12-11 NOTE — Patient Instructions (Addendum)
We will update your labs today regarding your liver, we will also get you scheduled for repeat liver US to continue to monitor. You can try doing 1 capful of miralax every other day if you feel that 1 capful per day is too much Please call your PCP to discuss your thyroid function being checked if you continue to feel fatigued, as your blood counts/iron/vitamin D are all normal.  Follow up 6 months

## 2021-12-19 ENCOUNTER — Telehealth (INDEPENDENT_AMBULATORY_CARE_PROVIDER_SITE_OTHER): Payer: Self-pay

## 2021-12-19 ENCOUNTER — Other Ambulatory Visit: Payer: Self-pay

## 2021-12-19 ENCOUNTER — Ambulatory Visit (HOSPITAL_COMMUNITY)
Admission: RE | Admit: 2021-12-19 | Discharge: 2021-12-19 | Disposition: A | Discharge status: Home / Self Care | Payer: Medicare HMO | Source: Ambulatory Visit | Attending: Gastroenterology | Admitting: Gastroenterology

## 2021-12-19 ENCOUNTER — Other Ambulatory Visit (INDEPENDENT_AMBULATORY_CARE_PROVIDER_SITE_OTHER): Payer: Self-pay

## 2021-12-19 DIAGNOSIS — R932 Abnormal findings on diagnostic imaging of liver and biliary tract: Secondary | ICD-10-CM | POA: Insufficient documentation

## 2021-12-19 DIAGNOSIS — N1832 Chronic kidney disease, stage 3b: Secondary | ICD-10-CM

## 2021-12-19 DIAGNOSIS — I739 Peripheral vascular disease, unspecified: Secondary | ICD-10-CM

## 2021-12-19 NOTE — Telephone Encounter (Signed)
Jeffery Ponce with [facility] called me this morning stating the patient was there for a Cmp and Afp she tried sticking the patient and was unsuccessful in getting his blood. She has no one else there to stick the patient. So she called to see if we would replace the labs from 12/11/2021 and submit to [facility]. I have removed the orders from [facility] and placed orders for [facility]. Patient came by the office today and picked up the new orders for [facility] and he will have them drawn there today.

## 2021-12-20 LAB — COMPREHENSIVE METABOLIC PANEL
ALT: 9 IU/L (ref 0–44)
AST: 13 IU/L (ref 0–40)
Albumin/Globulin Ratio: 1.3 (ref 1.2–2.2)
Albumin: 4.1 g/dL (ref 3.7–4.7)
Alkaline Phosphatase: 138 IU/L — ABNORMAL HIGH (ref 44–121)
BUN/Creatinine Ratio: 12 (ref 10–24)
BUN: 21 mg/dL (ref 8–27)
Bilirubin Total: 0.3 mg/dL (ref 0.0–1.2)
CO2: 21 mmol/L (ref 20–29)
Calcium: 9.4 mg/dL (ref 8.6–10.2)
Chloride: 101 mmol/L (ref 96–106)
Creatinine, Ser: 1.71 mg/dL — ABNORMAL HIGH (ref 0.76–1.27)
Globulin, Total: 3.1 g/dL (ref 1.5–4.5)
Glucose: 95 mg/dL (ref 70–99)
Potassium: 5.2 mmol/L (ref 3.5–5.2)
Sodium: 139 mmol/L (ref 134–144)
Total Protein: 7.2 g/dL (ref 6.0–8.5)
eGFR: 41 mL/min/{1.73_m2} — ABNORMAL LOW (ref >59)

## 2021-12-20 LAB — AFP TUMOR MARKER: AFP, Serum, Tumor Marker: <1.8 ng/mL (ref 0.0–8.4)

## 2022-01-15 NOTE — Progress Notes (Signed)
Reviewed at 07/19/21 visit by Beckey Rutter, NP

## 2022-01-16 ENCOUNTER — Other Ambulatory Visit (HOSPITAL_COMMUNITY): Payer: Self-pay

## 2022-01-16 DIAGNOSIS — C679 Malignant neoplasm of bladder, unspecified: Secondary | ICD-10-CM

## 2022-01-16 DIAGNOSIS — D500 Iron deficiency anemia secondary to blood loss (chronic): Secondary | ICD-10-CM

## 2022-01-16 DIAGNOSIS — D649 Anemia, unspecified: Secondary | ICD-10-CM

## 2022-01-17 ENCOUNTER — Inpatient Hospital Stay (HOSPITAL_COMMUNITY): Payer: Medicare HMO | Attending: Hematology

## 2022-01-17 DIAGNOSIS — Z800 Family history of malignant neoplasm of digestive organs: Secondary | ICD-10-CM | POA: Diagnosis not present

## 2022-01-17 DIAGNOSIS — D631 Anemia in chronic kidney disease: Secondary | ICD-10-CM | POA: Insufficient documentation

## 2022-01-17 DIAGNOSIS — C673 Malignant neoplasm of anterior wall of bladder: Secondary | ICD-10-CM | POA: Diagnosis not present

## 2022-01-17 DIAGNOSIS — C679 Malignant neoplasm of bladder, unspecified: Secondary | ICD-10-CM

## 2022-01-17 DIAGNOSIS — Z833 Family history of diabetes mellitus: Secondary | ICD-10-CM | POA: Insufficient documentation

## 2022-01-17 DIAGNOSIS — E611 Iron deficiency: Secondary | ICD-10-CM | POA: Insufficient documentation

## 2022-01-17 DIAGNOSIS — Z8249 Family history of ischemic heart disease and other diseases of the circulatory system: Secondary | ICD-10-CM | POA: Diagnosis not present

## 2022-01-17 DIAGNOSIS — Z79899 Other long term (current) drug therapy: Secondary | ICD-10-CM | POA: Diagnosis not present

## 2022-01-17 DIAGNOSIS — N1832 Chronic kidney disease, stage 3b: Secondary | ICD-10-CM | POA: Diagnosis not present

## 2022-01-17 DIAGNOSIS — F17210 Nicotine dependence, cigarettes, uncomplicated: Secondary | ICD-10-CM | POA: Insufficient documentation

## 2022-01-17 DIAGNOSIS — I700 Atherosclerosis of aorta: Secondary | ICD-10-CM | POA: Insufficient documentation

## 2022-01-17 DIAGNOSIS — D649 Anemia, unspecified: Secondary | ICD-10-CM

## 2022-01-17 DIAGNOSIS — M2550 Pain in unspecified joint: Secondary | ICD-10-CM | POA: Insufficient documentation

## 2022-01-17 DIAGNOSIS — D500 Iron deficiency anemia secondary to blood loss (chronic): Secondary | ICD-10-CM

## 2022-01-17 LAB — COMPREHENSIVE METABOLIC PANEL
ALT: 14 U/L (ref 0–44)
AST: 15 U/L (ref 15–41)
Albumin: 3.9 g/dL (ref 3.5–5.0)
Alkaline Phosphatase: 103 U/L (ref 38–126)
Anion gap: 9 (ref 5–15)
BUN: 53 mg/dL — ABNORMAL HIGH (ref 8–23)
CO2: 18 mmol/L — ABNORMAL LOW (ref 22–32)
Calcium: 8.8 mg/dL — ABNORMAL LOW (ref 8.9–10.3)
Chloride: 106 mmol/L (ref 98–111)
Creatinine, Ser: 3.06 mg/dL — ABNORMAL HIGH (ref 0.61–1.24)
GFR, Estimated: 20 mL/min — ABNORMAL LOW (ref >60)
Glucose, Bld: 115 mg/dL — ABNORMAL HIGH (ref 70–99)
Potassium: 4.8 mmol/L (ref 3.5–5.1)
Sodium: 133 mmol/L — ABNORMAL LOW (ref 135–145)
Total Bilirubin: 0.4 mg/dL (ref 0.3–1.2)
Total Protein: 7.3 g/dL (ref 6.5–8.1)

## 2022-01-17 LAB — CBC WITH DIFFERENTIAL/PLATELET
Abs Immature Granulocytes: 0.04 10*3/uL (ref 0.00–0.07)
Basophils Absolute: 0.1 10*3/uL (ref 0.0–0.1)
Basophils Relative: 0 %
Eosinophils Absolute: 0.3 10*3/uL (ref 0.0–0.5)
Eosinophils Relative: 3 %
HCT: 40.1 % (ref 39.0–52.0)
Hemoglobin: 12.8 g/dL — ABNORMAL LOW (ref 13.0–17.0)
Immature Granulocytes: 0 %
Lymphocytes Relative: 26 %
Lymphs Abs: 3.3 10*3/uL (ref 0.7–4.0)
MCH: 29.9 pg (ref 26.0–34.0)
MCHC: 31.9 g/dL (ref 30.0–36.0)
MCV: 93.7 fL (ref 80.0–100.0)
Monocytes Absolute: 1 10*3/uL (ref 0.1–1.0)
Monocytes Relative: 8 %
Neutro Abs: 7.8 10*3/uL — ABNORMAL HIGH (ref 1.7–7.7)
Neutrophils Relative %: 63 %
Platelets: 299 10*3/uL (ref 150–400)
RBC: 4.28 MIL/uL (ref 4.22–5.81)
RDW: 13.5 % (ref 11.5–15.5)
WBC: 12.4 10*3/uL — ABNORMAL HIGH (ref 4.0–10.5)
nRBC: 0 % (ref 0.0–0.2)

## 2022-01-17 LAB — FERRITIN: Ferritin: 56 ng/mL (ref 24–336)

## 2022-01-17 LAB — IRON AND TIBC
Iron: 79 ug/dL (ref 45–182)
Saturation Ratios: 24 % (ref 17.9–39.5)
TIBC: 331 ug/dL (ref 250–450)
UIBC: 252 ug/dL

## 2022-01-24 ENCOUNTER — Other Ambulatory Visit: Payer: Self-pay

## 2022-01-24 ENCOUNTER — Inpatient Hospital Stay (HOSPITAL_COMMUNITY): Payer: Medicare HMO | Admitting: Hematology

## 2022-01-24 VITALS — BP 108/66 | HR 104 | Temp 99.2°F | Resp 18 | Ht 70.0 in | Wt 159.2 lb

## 2022-01-24 DIAGNOSIS — D500 Iron deficiency anemia secondary to blood loss (chronic): Secondary | ICD-10-CM | POA: Diagnosis not present

## 2022-01-24 DIAGNOSIS — C679 Malignant neoplasm of bladder, unspecified: Secondary | ICD-10-CM | POA: Diagnosis not present

## 2022-01-24 DIAGNOSIS — C673 Malignant neoplasm of anterior wall of bladder: Secondary | ICD-10-CM | POA: Diagnosis not present

## 2022-01-24 NOTE — Progress Notes (Signed)
Jeffery Ponce, [place] [address]   CLINIC:  Medical Oncology/Hematology  PCP:  [facility] / [place] [address] [phone]   REASON FOR VISIT:  Follow-up for bladder cancer & normocytic anemia  PRIOR THERAPY:  1. Cisplatin and gemcitabine x 4 cycles from 12/30/2016 to 04/21/2017. 2. Cystoprostatectomy with ileal conduit on 07/30/2017.  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 Correlate with tissue sampling on cystoscopy.   No findings suspicious for upper tract disease.   Cholelithiasis, without associated inflammatory changes.   11/19/2016 Procedure   TURBT by [name] of a 2 centimeter anterior bladder wall tumor, placement of epirubicin intravesical   11/19/2016 Procedure   50 milligrams of epirubicin and 50 mL of diluent.  This was left indwelling for 1 hour by [name]   11/22/2016 Pathology Results   Bladder, transurethral resection, bladder tumor INFILTRATING HIGH GRADE UROTHELIAL CARCINOMA THE CARCINOMA INVADES MUSCULARIS PROPRIA (DETRUSOR MUSCLE) Microscopic Comment The neoplasm stains positive for high molecular weight cytokeratin, p63 and negative for prostein. The immunostain pattern supports the diagnosis of urothelial carcinoma.   12/18/2016 Imaging   Bilateral interstitial prominence. Active pneumonitis cannot be excluded. These changes could be also be related chronic interstitial lung disease.   12/30/2016 - 04/28/2017 Chemotherapy   The patient had palonosetron (ALOXI) injection 0.25 mg, 0.25 mg, Intravenous,  Once, 4 of 4 cycles  CISplatin  (PLATINOL) 138 mg in sodium chloride 0.9 % 500 mL chemo infusion, 70 mg/m2 = 138 mg, Intravenous,  Once, 4 of 4 cycles  gemcitabine (GEMZAR) 1,976 mg in sodium chloride 0.9 % 250 mL chemo infusion, 1,000 mg/m2 = 1,976 mg, Intravenous,  Once, 4 of 4 cycles Dose modification: 800 mg/m2 (80 % of original dose 1,000 mg/m2, Cycle 3, Reason: Provider Judgment, Comment: Thrombocytopenia)  fosaprepitant (EMEND) 150 mg, dexamethasone (DECADRON) 12 mg in sodium chloride 0.9 % 145 mL IVPB, , Intravenous,  Once, 4 of 4 cycles  for chemotherapy treatment.     02/17/2017 Treatment Plan Change   Treatment deferred x 7 days due to thrombocytopenia   02/17/2017 Treatment Plan Change   Gemcitabine dose reduced by 20% due to thrombocytopenia   03/10/2017 Treatment Plan Change   Cisplatin dose-reduced by 20%   04/28/2017 Treatment Plan Change   Day 15 of cycle #4 is cancelled due to thrombocytopenia (68,000).   05/02/2017 Imaging   CT abd/pelvis at [facility]-no evidence of metastatic disease or recurrent focal urothelial lesion on noncontrast imaging.  There is mild diffuse bladder wall thickening.  Urothelial evaluation limited without contrast.  Cholelithiasis, sigmoid diverticulosis, aortic atherosclerosis.   07/30/2017 [facility]   Cystoprostatectomy by [name] with curative intent.   07/30/2017 Pathology Results   Specimen: Bladder and prostate Procedure: Cystoprostatectomy Tumor site (if known): anterior wall Maximum tumor size (cm): 0.4 cm Histology: urothelial carcinoma Grade: high grade Microscopic tumor extension: deeper muscularis propria Lymph - Vascular invasion: Negative Involvement of adjacent organs/structures: Negative Additional epithelial lesions: Carcinoma in situ Margins: negative Lymph nodes: number examined 15 ; number positive 0 TNM code: ypT2b, ypN 0,   07/30/2017 Cancer Staging   Cancer Staging Urothelial carcinoma of bladder ([place] [facility]) Staging  form: Urinary  Bladder, AJCC 8th Edition - Clinical stage from 12/05/2016: Stage IIIA (cT3, cN0, cM0) - Signed by [name], [name] on 12/18/2016 - Pathologic stage from 12/10/2016: pT2, pN0, cM0 - Signed by [name], [name] on 12/10/2016 - Pathologic stage from 07/30/2017: Stage II (ypT2b, pN0, cM0) - Signed by [name], NP on 09/09/2017    11/26/2017 Imaging   CT abd./pelvis- 1. Several mildly enlarged lymph nodes are present including a 1.2 cm peripancreatic lymph node and a 1.4 cm low-density left external iliac lymph node or postoperative fluid collection. These merit surveillance. 2. A right middle lobe subpleural nodule along the minor fissure as a volume of 100 cubic mm. This level has not been included in prior cross-sectional imaging to assess for stability. Guidelines for follow up do not specifically apply due to history of malignancy. Well likely a benign subpleural lymph node, surveillance is likely warranted. 3. Other imaging findings of potential clinical significance: Aortic Atherosclerosis (ICD10-I70.0) and Emphysema (ICD10-J43.9). Coronary atherosclerosis with mitral and aortic valve calcification. Borderline appearance for distal esophageal wall thickening, query reflux. Several small left lower lobe nodules in the 3-4 mm range are probably inflammatory. Cholelithiasis. Cystoprostatectomy with loop ileostomy. Sigmoid diverticulosis. Notable common and external iliac stenosis bilaterally due to atherosclerosis. Lumbar impingement at L2- 3, L3-4, and L4-5.     CANCER STAGING:  Cancer Staging  Malignant papillary carcinoma of bladder ([place]) Staging form: Urinary Bladder, AJCC 8th Edition - Clinical stage from 12/05/2016: Stage IIIA (cT3, cN0, cM0) - Signed by [name], [name] on 12/18/2016 - Pathologic stage from 12/10/2016: pT2, pN0, cM0 - Signed by [name], [name] on 12/10/2016 - Pathologic stage from 07/30/2017: Stage II (ypT2b, pN0, cM0) - Signed by  [name], NP on 09/09/2017   INTERVAL HISTORY:  Jeffery Ponce, a 76 y.o. male, returns for routine follow-up of his bladder cancer & normocytic anemia. Jeffery Ponce was last seen on 01/15/2021.   Today he reports feeling good. He denies current bleeding. His energy levels are fair. He reports tolerating iron infusions well. He reports occasional bloating after eating.   REVIEW OF SYSTEMS:  Review of Systems  Constitutional:  Negative for appetite change and fatigue.  HENT:   Negative for nosebleeds.   Respiratory:  Negative for hemoptysis.   Gastrointestinal:  Negative for blood in stool.  Genitourinary:  Negative for hematuria.   Musculoskeletal:  Positive for arthralgias (5/10).  Hematological:  Does not bruise/bleed easily.  All other systems reviewed and are negative.  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[facility]) 12/10/2016   Past Surgical History:  Procedure Laterality Date   BIOPSY  11/13/2018   Procedure: BIOPSY;  Surgeon: Danie Binder, MD;  [facility];  Service: Endoscopy;;  gastric bx's   BIOPSY  12/22/2019   Procedure: BIOPSY;  Surgeon: Rogene Houston, MD;  [facility];  Service: Endoscopy;;  gastric   CARDIAC CATHETERIZATION     COLONOSCOPY WITH PROPOFOL N/A 11/14/2018   Procedure: COLONOSCOPY WITH PROPOFOL;  Surgeon: Danie Binder, MD;  [facility];  Service: Endoscopy;  Laterality: N/A;   CYSTOSCOPY  WITH INJECTION N/A 07/30/2017   Procedure: CYSTOSCOPY WITH INJECTION OF INDOCYANINE GREEN DYE;  Surgeon: Alexis Frock, MD;  Location: [facility];  Service: Urology;  Laterality: N/A;    ESOPHAGOGASTRODUODENOSCOPY N/A 12/22/2019   Procedure: ESOPHAGOGASTRODUODENOSCOPY (EGD);  Surgeon: Rogene Houston, MD;  [facility];  Service: Endoscopy;  Laterality: N/A;  1:00   ESOPHAGOGASTRODUODENOSCOPY (EGD) WITH PROPOFOL N/A 11/13/2018   Procedure: ESOPHAGOGASTRODUODENOSCOPY (EGD) WITH PROPOFOL;  Surgeon: Danie Binder, MD;  [facility];  Service: Endoscopy;  Laterality: N/A;   FEMUR FRACTURE SURGERY Right    GIVENS CAPSULE STUDY N/A 10/29/2019   Procedure: GIVENS CAPSULE STUDY;  Surgeon: Rogene Houston, MD;  [facility];  Service: Endoscopy;  Laterality: N/A;  730am   GIVENS CAPSULE STUDY N/A 12/22/2019   Procedure: GIVENS CAPSULE STUDY;  Surgeon: Rogene Houston, MD;  [facility];  Service: Endoscopy;  Laterality: N/A;   LOWER EXTREMITY ANGIOGRAPHY Right 10/19/2018   Procedure: LOWER EXTREMITY ANGIOGRAPHY;  Surgeon: Algernon Huxley, MD;  Location: [facility];  Service: Cardiovascular;  Laterality: Right;   MCT 3D RECONSTRUCTION ([facility] HX) Left    arm   open reduction and internal fixation leg Right    hip and leg.   POLYPECTOMY  11/14/2018   Procedure: POLYPECTOMY;  Surgeon: Danie Binder, MD;  [facility];  Service: Endoscopy;;   PORT-A-CATH REMOVAL N/A 07/20/2018   Procedure: REMOVAL PORT-A-CATH;  Surgeon: Aviva Signs, MD;  Location: [facility];  Service: General;  Laterality: N/A;   PORTACATH PLACEMENT N/A 12/23/2016   Procedure: PLACEMENT OF TUNNELED CENTRAL VENOUS CATHETER RIGHT INTERNAL JUGULAR WITH SUBCUTANEOUS PORT;  Surgeon: Vickie Epley, MD;  Location: [facility];  Service: Vascular;  Laterality: N/A;   reattatchment of left arm     from [place] 11/19/2016   Procedure: TRANSURETHRAL RESECTION OF BLADDER TUMOR (TURBT) WITH EPIRUBICIN INJECTION;  Surgeon: Franchot Gallo, MD;  Location: [facility];  Service: Urology;  Laterality: N/A;    SOCIAL HISTORY:  Social  History   Socioeconomic History   Marital status: Married    Spouse name: Not on file   Number of children: Not on file   Years of education: Not on file   Highest education level: Not on file  Occupational History   Not on file  Tobacco Use   Smoking status: Every Day    Packs/day: 1.00    Years: 50.00    Pack years: 50.00    Types: Cigarettes    Last attempt to quit: 10/30/2018    Years since quitting: 3.2   Smokeless tobacco: Never  Vaping Use   Vaping Use: Never used  Substance and Sexual Activity   Alcohol use: No   Drug use: No   Sexual activity: Never    Birth control/protection: None    Comment: married-30 years-2 children by first wife  Other Topics Concern   Not on file  Social History Narrative   Not on file   Social Determinants of Health   Financial Resource Strain: Not on file  Food Insecurity: Not on file  Transportation Needs: Not on file  Physical Activity: Not on file  Stress: Not on file  Social Connections: Not on file  Intimate Partner Violence: Not on file    FAMILY HISTORY:  Family History  Problem Relation Age of Onset   Aneurysm Mother    Diabetes Father  Colon cancer Brother     CURRENT MEDICATIONS:  Current Outpatient Medications  Medication Sig Dispense Refill   vitamin B-12 (CYANOCOBALAMIN) 50 MCG tablet Take 50 mcg by mouth daily.     albuterol (VENTOLIN HFA) 108 (90 Base) MCG/ACT inhaler Inhale 1-2 puffs into the lungs every 6 (six) hours as needed.     amLODipine (NORVASC) 5 MG tablet Take 5 mg by mouth daily.     atorvastatin (LIPITOR) 10 MG tablet Take 1 tablet (10 mg total) by mouth daily. 30 tablet 11   cholecalciferol (VITAMIN D) 25 MCG (1000 UT) tablet Take 1,000 Units by mouth daily.   4   FERREX 150 150 MG capsule Take 150 mg by mouth daily. (Patient not taking: Reported on 12/11/2021)     GNP ASPIRIN LOW DOSE 81 MG EC tablet TAKE 1 TABLET BY MOUTH ONCE DAILY. 30 tablet 11   hydrochlorothiazide (HYDRODIURIL) 12.5 MG  tablet Take 1 tablet by mouth daily. (Patient not taking: Reported on 12/11/2021)     lisinopril (ZESTRIL) 2.5 MG tablet Take 2.5 mg by mouth daily.     mirtazapine (REMERON) 30 MG tablet Take 1 tablet by mouth at bedtime. (Patient not taking: Reported on 12/11/2021)     morphine (MS CONTIN) 15 MG 12 hr tablet Take 15 mg by mouth 2 (two) times daily as needed for pain.     OVER THE COUNTER MEDICATION B 12 once per day.     Oxycodone HCl 10 MG TABS Take 1-2 tablets (10-20 mg total) by mouth every 4 (four) hours as needed (pain). Post-operatively 40 tablet 0   polyethylene glycol (MIRALAX) packet Take 17 g by mouth daily. 28 each 1   sodium bicarbonate 650 MG tablet Take 650 mg by mouth 2 (two) times daily.     sodium zirconium cyclosilicate (LOKELMA) 10 g PACK packet Take 1 packet by mouth 3 (three) times daily.     No current facility-administered medications for this visit.    ALLERGIES:  Allergies  Allergen Reactions   Penicillins Anaphylaxis and Other (See Comments)    Has patient had a PCN reaction causing immediate rash, facial/tongue/throat swelling, SOB or lightheadedness with hypotension:Yes Has patient had a PCN reaction causing severe rash involving mucus membranes or skin necrosis:Yes Has patient had a PCN reaction that required hospitalization:No Has patient had a PCN reaction occurring within the last 10 years:No If all of the above answers are "NO", then may proceed with Cephalosporin use.    Ciprofloxacin Itching    Itching at IV site. No hives or shortness of breathe.   Statins Other (See Comments)   Ambien [Zolpidem Tartrate] Other (See Comments)    "Sleep walking"   Oxycodone Nausea Only   Zolpidem     Other reaction(s): Other (see comments) "Sleep walking"    PHYSICAL EXAM:  Performance status (ECOG): 1 - Symptomatic but completely ambulatory  Vitals:   01/24/22 1400  BP: 108/66  Pulse: (!) 104  Resp: 18  Temp: 99.2 F (37.3 C)  SpO2: 95%   Wt Readings  from Last 3 Encounters:  01/24/22 159 lb 3.2 oz (72.2 kg)  12/11/21 157 lb 11.2 oz (71.5 kg)  08/01/21 156 lb 12.8 oz (71.1 kg)   Physical Exam Vitals reviewed.  Constitutional:      Appearance: Normal appearance.  Cardiovascular:     Rate and Rhythm: Normal rate and regular rhythm.     Pulses: Normal pulses.     Heart sounds: Normal heart sounds.  Pulmonary:     Effort: Pulmonary effort is normal.     Breath sounds: Normal breath sounds.  Neurological:     General: No focal deficit present.     Mental Status: He is alert and oriented to person, place, and time.  Psychiatric:        Mood and Affect: Mood normal.        Behavior: Behavior normal.     LABORATORY DATA:  I have reviewed the labs as listed.  CBC Latest Ref Rng & Units 01/17/2022 07/12/2021 01/10/2021  WBC 4.0 - 10.5 K/uL 12.4(H) 10.2 10.1  Hemoglobin 13.0 - 17.0 g/dL 12.8(L) 12.8(L) 13.8  Hematocrit 39.0 - 52.0 % 40.1 41.0 43.4  Platelets 150 - 400 K/uL 299 249 300   CMP Latest Ref Rng & Units 01/17/2022 12/19/2021 07/12/2021  Glucose 70 - 99 mg/dL 115(H) 95 91  BUN 8 - 23 mg/dL 53(H) 21 22  Creatinine 0.61 - 1.24 mg/dL 3.06(H) 1.71(H) 1.93(H)  Sodium 135 - 145 mmol/L 133(L) 139 139  Potassium 3.5 - 5.1 mmol/L 4.8 5.2 4.4  Chloride 98 - 111 mmol/L 106 101 105  CO2 22 - 32 mmol/L 18(L) 21 26  Calcium 8.9 - 10.3 mg/dL 8.8(L) 9.4 9.4  Total Protein 6.5 - 8.1 g/dL 7.3 7.2 7.7  Total Bilirubin 0.3 - 1.2 mg/dL 0.4 0.3 0.5  Alkaline Phos 38 - 126 U/L 103 138(H) 99  AST 15 - 41 U/L 15 13 13(L)  ALT 0 - 44 U/L 14 9 8     DIAGNOSTIC IMAGING:  I have independently reviewed the scans and discussed with the patient. No results found.   ASSESSMENT:  1.  Normocytic anemia: -Combination anemia from CKD and IDA. -Receives intermittent parenteral iron therapy.  Last Venofer was on 01/05/2020. -EGD on 12/22/2019 showed normal esophagus, multiple petechial hemorrhages noted at gastric body and antrum.  Normal duodenal bulb and  second part of duodenum. -Capsule study on 12/22/2019 showed multiple petechial hemorrhages involving gastric mucosa, fresh blood noted on images likely from trauma as the capsule forced across the pylorus.  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  Dark black liquid noted in the distal small bowel likely due to iron.   2.  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 -As there was suspicion for muscle invasion, underwent cisplatin and gemcitabine 4 cycles from 12/30/2016 through 04/21/2017 followed by cystoprostatectomy and ileal conduit on 07/30/2017, showing residual disease of 0.4 cm. -CT CAP on 06/20/2020 with no findings of recurrence or metastatic disease.  Hepatic morphology with mild cirrhosis cannot be excluded.  Similar bilateral pulmonary nodules, favoring benign etiology.  Slight enlargement of mediastinal node which is not pathologic considered reactive.   PLAN:  1.  Normocytic anemia: - He does not report any bleeding per rectum or melena.  No severe fatigue reported. - Reviewed labs from 01/17/2022.  Ferritin is 56, percent saturation is 24.  Hemoglobin 12.8.  Ferritin is down from 78 in August.  He had received 2 iron infusions in August. - As the ferritin is trending down despite IV iron, recommend Venofer x3 for total of 1 g. - RTC 6 months with repeat CBC, ferritin and iron panel.   2.  Bladder cancer: - Last CT [facility] on 07/13/2021 showed right lower quadrant ileal conduit urinary diversion with no evidence of recurrence or metastatic disease.  3.  CKD: - Creatinine last week has increased to 3.06, from his baseline around 1.5-1.7. - I have recommended him to  follow-up with Dr. Theador Hawthorne.   Orders placed this encounter:  No orders of the defined types were placed in this encounter.    Derek Jack, MD [place] [phone]   I, Thana Ates, am acting as a scribe for Dr. Derek Jack.  I, Derek Jack MD, have reviewed the  above documentation for accuracy and completeness, and I agree with the above.

## 2022-01-24 NOTE — Patient Instructions (Signed)
[place] at [facility] Discharge Instructions  You were seen and examined today by Dr. Delton Coombes. He reviewed your most recent labs and your kidney functions are elevated. He wants you to see Dr. Theador Hawthorne sooner. Avoid foods that are causing your acid reflux and start taking Pepcid to help.Marland Kitchen Please keep follow up appointments as scheduled.   Thank you for choosing [place] at [facility] to provide your oncology and hematology care.  To afford each patient quality time with our provider, please arrive at least 15 minutes before your scheduled appointment time.   If you have a lab appointment with the [facility] please come in thru the Main Entrance and check in at the main information desk.  You need to re-schedule your appointment should you arrive 10 or more minutes late.  We strive to give you quality time with our providers, and arriving late affects you and other patients whose appointments are after yours.  Also, if you no show three or more times for appointments you may be dismissed from the clinic at the providers discretion.     Again, thank you for choosing [facility].  Our hope is that these requests will decrease the amount of time that you wait before being seen by our physicians.       _____________________________________________________________  Should you have questions after your visit to [facility], please contact our office at [phone] and follow the prompts.  Our office hours are 8:00 a.m. and 4:30 p.m. Monday - Friday.  Please note that voicemails left after 4:00 p.m. may not be returned until the following business day.  We are closed weekends and major holidays.  You do have access to a nurse 24-7, just call the main number to the clinic [phone] and do not press any options, hold on the line and a nurse will answer the phone.    For prescription refill requests, have your pharmacy  contact our office and allow 72 hours.    Due to Covid, you will need to wear a mask upon entering the hospital. If you do not have a mask, a mask will be given to you at the Main Entrance upon arrival. For doctor visits, patients may have 1 support person age 48 or older with them. For treatment visits, patients can not have anyone with them due to social distancing guidelines and our immunocompromised population.

## 2022-01-28 ENCOUNTER — Other Ambulatory Visit (HOSPITAL_COMMUNITY): Payer: Self-pay | Admitting: Nephrology

## 2022-01-28 DIAGNOSIS — E872 Acidosis, unspecified: Secondary | ICD-10-CM

## 2022-01-28 DIAGNOSIS — E871 Hypo-osmolality and hyponatremia: Secondary | ICD-10-CM

## 2022-01-28 DIAGNOSIS — N170 Acute kidney failure with tubular necrosis: Secondary | ICD-10-CM

## 2022-01-28 DIAGNOSIS — N1832 Chronic kidney disease, stage 3b: Secondary | ICD-10-CM

## 2022-01-28 DIAGNOSIS — N181 Chronic kidney disease, stage 1: Secondary | ICD-10-CM

## 2022-01-28 DIAGNOSIS — R809 Proteinuria, unspecified: Secondary | ICD-10-CM

## 2022-01-28 DIAGNOSIS — I129 Hypertensive chronic kidney disease with stage 1 through stage 4 chronic kidney disease, or unspecified chronic kidney disease: Secondary | ICD-10-CM

## 2022-01-29 ENCOUNTER — Other Ambulatory Visit: Payer: Self-pay

## 2022-01-29 ENCOUNTER — Inpatient Hospital Stay (HOSPITAL_COMMUNITY): Payer: Medicare HMO

## 2022-01-29 VITALS — BP 119/62 | HR 78 | Temp 96.7°F | Resp 18 | Ht 70.0 in | Wt 155.8 lb

## 2022-01-29 DIAGNOSIS — C673 Malignant neoplasm of anterior wall of bladder: Secondary | ICD-10-CM | POA: Diagnosis not present

## 2022-01-29 DIAGNOSIS — D649 Anemia, unspecified: Secondary | ICD-10-CM

## 2022-01-29 DIAGNOSIS — D500 Iron deficiency anemia secondary to blood loss (chronic): Secondary | ICD-10-CM

## 2022-01-29 MED ORDER — ACETAMINOPHEN 325 MG PO TABS
650.0000 mg | ORAL_TABLET | Freq: Once | ORAL | Status: AC
  Administered 2022-01-29: 650 mg via ORAL
  Filled 2022-01-29: qty 2

## 2022-01-29 MED ORDER — FAMOTIDINE 20 MG PO TABS
20.0000 mg | ORAL_TABLET | Freq: Once | ORAL | Status: AC
  Administered 2022-01-29: 20 mg via ORAL
  Filled 2022-01-29: qty 1

## 2022-01-29 MED ORDER — SODIUM CHLORIDE 0.9 % IV SOLN: Freq: Once | INTRAVENOUS | Status: AC

## 2022-01-29 MED ORDER — SODIUM CHLORIDE 0.9 % IV SOLN
300.0000 mg | Freq: Once | INTRAVENOUS | Status: AC
  Administered 2022-01-29: 300 mg via INTRAVENOUS
  Filled 2022-01-29: qty 300

## 2022-01-29 MED ORDER — LORATADINE 10 MG PO TABS
10.0000 mg | ORAL_TABLET | Freq: Once | ORAL | Status: AC
  Administered 2022-01-29: 10 mg via ORAL
  Filled 2022-01-29: qty 1

## 2022-01-29 NOTE — Patient Instructions (Signed)
[facility]  Discharge Instructions: Thank you for choosing [facility] to provide your oncology and hematology care.  If you have a lab appointment with the Cancer Center, please come in thru the Main Entrance and check in at the main information desk.  Wear comfortable clothing and clothing appropriate for easy access to any Portacath or PICC line.   We strive to give you quality time with your provider. You may need to reschedule your appointment if you arrive late (15 or more minutes).  Arriving late affects you and other patients whose appointments are after yours.  Also, if you miss three or more appointments without notifying the office, you may be dismissed from the clinic at the provider's discretion.      For prescription refill requests, have your pharmacy contact our office and allow 72 hours for refills to be completed.        To help prevent nausea and vomiting after your treatment, we encourage you to take your nausea medication as directed.  BELOW ARE SYMPTOMS THAT SHOULD BE REPORTED IMMEDIATELY: *FEVER GREATER THAN 100.4 F (38 C) OR HIGHER *CHILLS OR SWEATING *NAUSEA AND VOMITING THAT IS NOT CONTROLLED WITH YOUR NAUSEA MEDICATION *UNUSUAL SHORTNESS OF BREATH *UNUSUAL BRUISING OR BLEEDING *URINARY PROBLEMS (pain or burning when urinating, or frequent urination) *BOWEL PROBLEMS (unusual diarrhea, constipation, pain near the anus) TENDERNESS IN MOUTH AND THROAT WITH OR WITHOUT PRESENCE OF ULCERS (sore throat, sores in mouth, or a toothache) UNUSUAL RASH, SWELLING OR PAIN  UNUSUAL VAGINAL DISCHARGE OR ITCHING   Items with * indicate a potential emergency and should be followed up as soon as possible or go to the Emergency Department if any problems should occur.  Please show the CHEMOTHERAPY ALERT CARD or IMMUNOTHERAPY ALERT CARD at check-in to the Emergency Department and triage nurse.  Should you have questions after your visit or need to cancel  or reschedule your appointment, please contact [facility] [phone]  and follow the prompts.  Office hours are 8:00 a.m. to 4:30 p.m. Monday - Friday. Please note that voicemails left after 4:00 p.m. may not be returned until the following business day.  We are closed weekends and major holidays. You have access to a nurse at all times for urgent questions. Please call the main number to the clinic [phone] and follow the prompts.  For any non-urgent questions, you may also contact your provider using MyChart. We now offer e-Visits for anyone 18 and older to request care online for non-urgent symptoms. For details visit [email or web address].   Also download the MyChart app! Go to the app store, search "MyChart", open the app, select , and log in with your MyChart username and password.  Due to Covid, a mask is required upon entering the hospital/clinic. If you do not have a mask, one will be given to you upon arrival. For doctor visits, patients may have 1 support person aged 18 or older with them. For treatment visits, patients cannot have anyone with them due to current Covid guidelines and our immunocompromised population.  

## 2022-01-29 NOTE — Progress Notes (Signed)
Patient did not want to stay the full 30 minute wait time after the iron infusion.  Reviewed side effects with understanding verbalized.  Patient stated he would "sit in the car for 30 minutes" and come back to the ER if he needed to.  No s/s of distress noted.   Patient tolerated iron infusion with no complaints voiced.  Peripheral IV site clean and dry with good blood return noted before and after infusion.  Band aid applied.  VSS with discharge and left in satisfactory condition with no s/s of distress noted.

## 2022-01-30 ENCOUNTER — Ambulatory Visit (HOSPITAL_COMMUNITY)
Admission: RE | Admit: 2022-01-30 | Discharge: 2022-01-30 | Disposition: A | Discharge status: Home / Self Care | Payer: Medicare HMO | Source: Ambulatory Visit | Attending: Nephrology | Admitting: Nephrology

## 2022-01-30 DIAGNOSIS — E871 Hypo-osmolality and hyponatremia: Secondary | ICD-10-CM | POA: Insufficient documentation

## 2022-01-30 DIAGNOSIS — R809 Proteinuria, unspecified: Secondary | ICD-10-CM | POA: Diagnosis present

## 2022-01-30 DIAGNOSIS — E872 Acidosis, unspecified: Secondary | ICD-10-CM | POA: Diagnosis present

## 2022-01-30 DIAGNOSIS — N1832 Chronic kidney disease, stage 3b: Secondary | ICD-10-CM | POA: Insufficient documentation

## 2022-01-30 DIAGNOSIS — N170 Acute kidney failure with tubular necrosis: Secondary | ICD-10-CM | POA: Insufficient documentation

## 2022-01-30 DIAGNOSIS — I129 Hypertensive chronic kidney disease with stage 1 through stage 4 chronic kidney disease, or unspecified chronic kidney disease: Secondary | ICD-10-CM | POA: Diagnosis present

## 2022-01-30 DIAGNOSIS — N181 Chronic kidney disease, stage 1: Secondary | ICD-10-CM | POA: Insufficient documentation

## 2022-02-05 ENCOUNTER — Inpatient Hospital Stay (HOSPITAL_COMMUNITY): Payer: Medicare HMO

## 2022-02-05 ENCOUNTER — Other Ambulatory Visit: Payer: Self-pay

## 2022-02-05 VITALS — BP 141/63 | HR 66 | Temp 97.7°F | Resp 18

## 2022-02-05 DIAGNOSIS — C673 Malignant neoplasm of anterior wall of bladder: Secondary | ICD-10-CM | POA: Diagnosis not present

## 2022-02-05 DIAGNOSIS — D500 Iron deficiency anemia secondary to blood loss (chronic): Secondary | ICD-10-CM

## 2022-02-05 DIAGNOSIS — D649 Anemia, unspecified: Secondary | ICD-10-CM

## 2022-02-05 MED ORDER — FAMOTIDINE 20 MG PO TABS: 20.0000 mg | ORAL_TABLET | Freq: Once | ORAL | Status: DC

## 2022-02-05 MED ORDER — SODIUM CHLORIDE 0.9 % IV SOLN: Freq: Once | INTRAVENOUS | Status: AC

## 2022-02-05 MED ORDER — SODIUM CHLORIDE 0.9 % IV SOLN
300.0000 mg | Freq: Once | INTRAVENOUS | Status: AC
  Administered 2022-02-05: 300 mg via INTRAVENOUS
  Filled 2022-02-05: qty 300

## 2022-02-05 MED ORDER — LORATADINE 10 MG PO TABS
10.0000 mg | ORAL_TABLET | Freq: Once | ORAL | Status: AC
  Administered 2022-02-05: 10 mg via ORAL
  Filled 2022-02-05: qty 1

## 2022-02-05 MED ORDER — ACETAMINOPHEN 325 MG PO TABS
650.0000 mg | ORAL_TABLET | Freq: Once | ORAL | Status: AC
  Administered 2022-02-05: 650 mg via ORAL
  Filled 2022-02-05: qty 2

## 2022-02-05 NOTE — Progress Notes (Signed)
Patient presents today for Venofer infusion per providers order.  Vital signs WNL.  Patient to pepcid prior to visit.  Patient has no new complaints at this time.  Peripheral IV started and blood return noted pre and post infusion.  Venofer infusion given today per MD orders.  Stable during infusion without adverse affects.  Vital signs stable.  No complaints at this time.  Patient refuses to wait the full thirty minute post infusion wait time.  Discharge from clinic ambulatory in stable condition.  Alert and oriented X 3.  Follow up with [facility] as scheduled.

## 2022-02-05 NOTE — Patient Instructions (Signed)
[facility]  Discharge Instructions: Thank you for choosing [facility] to provide your oncology and hematology care.  If you have a lab appointment with the Cancer Center, please come in thru the Main Entrance and check in at the main information desk.  Wear comfortable clothing and clothing appropriate for easy access to any Portacath or PICC line.   We strive to give you quality time with your provider. You may need to reschedule your appointment if you arrive late (15 or more minutes).  Arriving late affects you and other patients whose appointments are after yours.  Also, if you miss three or more appointments without notifying the office, you may be dismissed from the clinic at the provider's discretion.      For prescription refill requests, have your pharmacy contact our office and allow 72 hours for refills to be completed.    Today you received the following chemotherapy and/or immunotherapy agents Venofer      To help prevent nausea and vomiting after your treatment, we encourage you to take your nausea medication as directed.  BELOW ARE SYMPTOMS THAT SHOULD BE REPORTED IMMEDIATELY: *FEVER GREATER THAN 100.4 F (38 C) OR HIGHER *CHILLS OR SWEATING *NAUSEA AND VOMITING THAT IS NOT CONTROLLED WITH YOUR NAUSEA MEDICATION *UNUSUAL SHORTNESS OF BREATH *UNUSUAL BRUISING OR BLEEDING *URINARY PROBLEMS (pain or burning when urinating, or frequent urination) *BOWEL PROBLEMS (unusual diarrhea, constipation, pain near the anus) TENDERNESS IN MOUTH AND THROAT WITH OR WITHOUT PRESENCE OF ULCERS (sore throat, sores in mouth, or a toothache) UNUSUAL RASH, SWELLING OR PAIN  UNUSUAL VAGINAL DISCHARGE OR ITCHING   Items with * indicate a potential emergency and should be followed up as soon as possible or go to the Emergency Department if any problems should occur.  Please show the CHEMOTHERAPY ALERT CARD or IMMUNOTHERAPY ALERT CARD at check-in to the Emergency  Department and triage nurse.  Should you have questions after your visit or need to cancel or reschedule your appointment, please contact [facility] [phone]  and follow the prompts.  Office hours are 8:00 a.m. to 4:30 p.m. Monday - Friday. Please note that voicemails left after 4:00 p.m. may not be returned until the following business day.  We are closed weekends and major holidays. You have access to a nurse at all times for urgent questions. Please call the main number to the clinic [phone] and follow the prompts.  For any non-urgent questions, you may also contact your provider using MyChart. We now offer e-Visits for anyone 18 and older to request care online for non-urgent symptoms. For details visit [email or web address].   Also download the MyChart app! Go to the app store, search "MyChart", open the app, select [place], and log in with your MyChart username and password.  Due to Covid, a mask is required upon entering the hospital/clinic. If you do not have a mask, one will be given to you upon arrival. For doctor visits, patients may have 1 support person aged 18 or older with them. For treatment visits, patients cannot have anyone with them due to current Covid guidelines and our immunocompromised population.  

## 2022-02-12 ENCOUNTER — Inpatient Hospital Stay (HOSPITAL_COMMUNITY): Payer: Medicare HMO | Attending: Hematology

## 2022-02-12 ENCOUNTER — Other Ambulatory Visit: Payer: Self-pay

## 2022-02-12 VITALS — BP 127/65 | HR 76 | Temp 98.7°F | Resp 18 | Ht 70.0 in | Wt 156.9 lb

## 2022-02-12 DIAGNOSIS — N1832 Chronic kidney disease, stage 3b: Secondary | ICD-10-CM | POA: Insufficient documentation

## 2022-02-12 DIAGNOSIS — C673 Malignant neoplasm of anterior wall of bladder: Secondary | ICD-10-CM | POA: Diagnosis not present

## 2022-02-12 DIAGNOSIS — F17210 Nicotine dependence, cigarettes, uncomplicated: Secondary | ICD-10-CM | POA: Insufficient documentation

## 2022-02-12 DIAGNOSIS — Z8249 Family history of ischemic heart disease and other diseases of the circulatory system: Secondary | ICD-10-CM | POA: Diagnosis not present

## 2022-02-12 DIAGNOSIS — D649 Anemia, unspecified: Secondary | ICD-10-CM | POA: Diagnosis not present

## 2022-02-12 DIAGNOSIS — Z79899 Other long term (current) drug therapy: Secondary | ICD-10-CM | POA: Diagnosis not present

## 2022-02-12 DIAGNOSIS — Z800 Family history of malignant neoplasm of digestive organs: Secondary | ICD-10-CM | POA: Insufficient documentation

## 2022-02-12 DIAGNOSIS — D500 Iron deficiency anemia secondary to blood loss (chronic): Secondary | ICD-10-CM

## 2022-02-12 DIAGNOSIS — Z833 Family history of diabetes mellitus: Secondary | ICD-10-CM | POA: Insufficient documentation

## 2022-02-12 MED ORDER — SODIUM CHLORIDE 0.9 % IV SOLN
400.0000 mg | Freq: Once | INTRAVENOUS | Status: AC
  Administered 2022-02-12: 400 mg via INTRAVENOUS
  Filled 2022-02-12: qty 20

## 2022-02-12 MED ORDER — SODIUM CHLORIDE 0.9 % IV SOLN: Freq: Once | INTRAVENOUS | Status: AC

## 2022-02-12 MED ORDER — FAMOTIDINE 20 MG PO TABS
20.0000 mg | ORAL_TABLET | Freq: Once | ORAL | Status: AC
  Administered 2022-02-12: 20 mg via ORAL
  Filled 2022-02-12: qty 1

## 2022-02-12 MED ORDER — LORATADINE 10 MG PO TABS
10.0000 mg | ORAL_TABLET | Freq: Once | ORAL | Status: AC
  Administered 2022-02-12: 10 mg via ORAL
  Filled 2022-02-12: qty 1

## 2022-02-12 MED ORDER — ACETAMINOPHEN 325 MG PO TABS
650.0000 mg | ORAL_TABLET | Freq: Once | ORAL | Status: AC
  Administered 2022-02-12: 650 mg via ORAL
  Filled 2022-02-12: qty 2

## 2022-02-12 NOTE — Patient Instructions (Signed)
[facility]  Discharge Instructions: Thank you for choosing [facility] to provide your oncology and hematology care.  If you have a lab appointment with the Cancer Center, please come in thru the Main Entrance and check in at the main information desk.  Wear comfortable clothing and clothing appropriate for easy access to any Portacath or PICC line.   We strive to give you quality time with your provider. You may need to reschedule your appointment if you arrive late (15 or more minutes).  Arriving late affects you and other patients whose appointments are after yours.  Also, if you miss three or more appointments without notifying the office, you may be dismissed from the clinic at the provider's discretion.      For prescription refill requests, have your pharmacy contact our office and allow 72 hours for refills to be completed.    Today you received the following chemotherapy and/or immunotherapy agents Venofer      To help prevent nausea and vomiting after your treatment, we encourage you to take your nausea medication as directed.  BELOW ARE SYMPTOMS THAT SHOULD BE REPORTED IMMEDIATELY: *FEVER GREATER THAN 100.4 F (38 C) OR HIGHER *CHILLS OR SWEATING *NAUSEA AND VOMITING THAT IS NOT CONTROLLED WITH YOUR NAUSEA MEDICATION *UNUSUAL SHORTNESS OF BREATH *UNUSUAL BRUISING OR BLEEDING *URINARY PROBLEMS (pain or burning when urinating, or frequent urination) *BOWEL PROBLEMS (unusual diarrhea, constipation, pain near the anus) TENDERNESS IN MOUTH AND THROAT WITH OR WITHOUT PRESENCE OF ULCERS (sore throat, sores in mouth, or a toothache) UNUSUAL RASH, SWELLING OR PAIN  UNUSUAL VAGINAL DISCHARGE OR ITCHING   Items with * indicate a potential emergency and should be followed up as soon as possible or go to the Emergency Department if any problems should occur.  Please show the CHEMOTHERAPY ALERT CARD or IMMUNOTHERAPY ALERT CARD at check-in to the Emergency  Department and triage nurse.  Should you have questions after your visit or need to cancel or reschedule your appointment, please contact [facility] [phone]  and follow the prompts.  Office hours are 8:00 a.m. to 4:30 p.m. Monday - Friday. Please note that voicemails left after 4:00 p.m. may not be returned until the following business day.  We are closed weekends and major holidays. You have access to a nurse at all times for urgent questions. Please call the main number to the clinic [phone] and follow the prompts.  For any non-urgent questions, you may also contact your provider using MyChart. We now offer e-Visits for anyone 18 and older to request care online for non-urgent symptoms. For details visit [email or web address].   Also download the MyChart app! Go to the app store, search "MyChart", open the app, select [place], and log in with your MyChart username and password.  Due to Covid, a mask is required upon entering the hospital/clinic. If you do not have a mask, one will be given to you upon arrival. For doctor visits, patients may have 1 support person aged 18 or older with them. For treatment visits, patients cannot have anyone with them due to current Covid guidelines and our immunocompromised population.  

## 2022-02-12 NOTE — Progress Notes (Signed)
Patient presents today for Venofer infusion per providers order.  Vital signs WNL.  Patient has no new complaints at this time.  Peripheral IV started and blood return noted pre and post infusion.  Venofer infusion given today per MD orders.  Stable during infusion without adverse affects.  Vital signs stable.  No complaints at this time.  Discharge from clinic ambulatory in stable condition.  Alert and oriented X 3.  Follow up with [facility] as scheduled.  

## 2022-02-15 ENCOUNTER — Encounter (HOSPITAL_COMMUNITY): Payer: Self-pay

## 2022-02-15 NOTE — Progress Notes (Signed)
Patient called stating that he has a knot that came up where they put in his bladder bag. He wants to know if he needs to get it checked out. He states that it is not painful. Called patient and spoke with his wife. Made them aware that he needs to call the surgeon to let them know. ?

## 2022-05-01 ENCOUNTER — Encounter (INDEPENDENT_AMBULATORY_CARE_PROVIDER_SITE_OTHER): Payer: Self-pay | Admitting: Gastroenterology

## 2022-06-04 ENCOUNTER — Encounter (INDEPENDENT_AMBULATORY_CARE_PROVIDER_SITE_OTHER): Payer: Self-pay | Admitting: Gastroenterology

## 2022-06-04 ENCOUNTER — Ambulatory Visit (INDEPENDENT_AMBULATORY_CARE_PROVIDER_SITE_OTHER): Payer: Medicare HMO | Admitting: Gastroenterology

## 2022-06-04 VITALS — BP 162/83 | HR 84 | Temp 98.4°F | Ht 70.0 in | Wt 159.8 lb

## 2022-06-04 DIAGNOSIS — K5900 Constipation, unspecified: Secondary | ICD-10-CM

## 2022-06-04 DIAGNOSIS — D508 Other iron deficiency anemias: Secondary | ICD-10-CM

## 2022-06-04 DIAGNOSIS — R932 Abnormal findings on diagnostic imaging of liver and biliary tract: Secondary | ICD-10-CM | POA: Diagnosis not present

## 2022-06-04 NOTE — Progress Notes (Signed)
Referring Provider: The [facility] Primary Care Physician:  The [place] Primary GI Physician: Jenetta Downer  Chief Complaint  Patient presents with   Anemia    6 month follow up on IDA. Patient has concerns about cirrhosis. Has fatigue. States he feels about the same as last visit.    HPI:   Jeffery Ponce is a 76 y.o. 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   Patient presenting today for follow up of anemia/possible advanced liver disease  History: last seen 12/11/21, doing okay at that time, seeing Hematology and receiving iron infusions, endorsed more fatigue at that time, denied rectal bleeding or melena. He was also noted to have incidental findings of possible liver cirrhosis on previous CT imaging, he had [place] elastography in March 2022 which was also concerning for liver cirrhosis in presence of Caudate lobe enlargement, patient declined liver biopsy at that time and requested to have routine 6 month monitoring with [place], AFP and MELD labs. RUQ [place], AFP and CMP updated at last visit. He was advised to try miralax every other day for constipation.   [place] RUQ 12/19/21 Mildly increased diffuse hepatic parenchymal echogenicity, similar to that noted on prior examination, most commonly seen in the setting of mild hepatic steatosis. No focal intrahepatic mass.Status post cholecystectomy.  Labs January 2023 AFP <1.8 12/19/21 LFTs WNL in February with AST 15, ALT 14, ALP 103, T bili 0.69m hgb 12.8, plt 299k Ferritin 56, iron 79, TIBC 331, saturation 24%  Present:  Patient states that he is doing well. He has been very active and planted his garden recently. He saw Dr. KRaliegh Ipin February and was stopped on IV iron as ferritin was not responding, he had venofer x3. Denies any rectal bleeding, sob or dizziness. Has had some fatigue but this is mild, ongoing. Vitamin D in May was 48.4 he is taking  daily vitamin D.   He denies abdominal pain. He is maintained on oxycodone, having a BM once daily, sometimes every other day. Taking miralax 2-3x/week with good results. Appetite is good. Weight is stable.   Denies ascites, confusion, jaundice or pruritus.   No red flag symptoms. Patient denies melena, hematochezia, nausea, vomiting, diarrhea, dysphagia, odyonophagia, early satiety or weight loss.   Last Colonoscopy:11/13/18 no source for melena or bleeding found, small hh, mild gastritis/duodenitis Last Endoscopy:11/14/18 single non bleeding colonic angioectasia, treated with APC, diverticulosis in recto sigmoid colon, sigmoid and descending colon, six 4-775mpolyps in rectum and distal ascending colon, removed with cold snare, biopsies tubular adenomas, internal hemorrhoids, tortuous colon   Past Medical History:  Diagnosis Date   Arthritis    Chronic renal disease, stage 3, moderately decreased glomerular filtration rate (GFR) between 30-59 mL/min/1.73 square meter (HCC) 11/28/2017   Heart murmur    History of kidney stones    Hypercholesteremia    Urothelial carcinoma ([facility]/01/2017   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    Past Surgical History:  Procedure Laterality Date   BIOPSY  11/13/2018   Procedure: BIOPSY;  Surgeon: FiDanie BinderMD;  [facility];  Service: Endoscopy;;  gastric bx's   BIOPSY  12/22/2019   Procedure: BIOPSY;  Surgeon: ReRogene HoustonMD;  [facility];  Service: Endoscopy;;  gastric   CARDIAC CATHETERIZATION     COLONOSCOPY WITH PROPOFOL N/A 11/14/2018   Procedure: COLONOSCOPY WITH  PROPOFOL;  Surgeon: Danie Binder, MD;  [facility];  Service: Endoscopy;  Laterality: N/A;   CYSTOSCOPY WITH INJECTION N/A 07/30/2017   Procedure: CYSTOSCOPY WITH INJECTION OF INDOCYANINE GREEN DYE;  Surgeon: Alexis Frock, MD;  [facility]: [facility];  Service: Urology;  Laterality: N/A;   ESOPHAGOGASTRODUODENOSCOPY N/A 12/22/2019    Procedure: ESOPHAGOGASTRODUODENOSCOPY (EGD);  Surgeon: Rogene Houston, MD;  [facility];  Service: Endoscopy;  Laterality: N/A;  1:00   ESOPHAGOGASTRODUODENOSCOPY (EGD) WITH PROPOFOL N/A 11/13/2018   Procedure: ESOPHAGOGASTRODUODENOSCOPY (EGD) WITH PROPOFOL;  Surgeon: Danie Binder, MD;  [facility];  Service: Endoscopy;  Laterality: N/A;   FEMUR FRACTURE SURGERY Right    GIVENS CAPSULE STUDY N/A 10/29/2019   Procedure: GIVENS CAPSULE STUDY;  Surgeon: Rogene Houston, MD;  [facility];  Service: Endoscopy;  Laterality: N/A;  730am   GIVENS CAPSULE STUDY N/A 12/22/2019   Procedure: GIVENS CAPSULE STUDY;  Surgeon: Rogene Houston, MD;  [facility];  Service: Endoscopy;  Laterality: N/A;   LOWER EXTREMITY ANGIOGRAPHY Right 10/19/2018   Procedure: LOWER EXTREMITY ANGIOGRAPHY;  Surgeon: Algernon Huxley, MD;  Location: [facility];  Service: Cardiovascular;  Laterality: Right;   MCT 3D RECONSTRUCTION ([facility] HX) Left    arm   open reduction and internal fixation leg Right    hip and leg.   POLYPECTOMY  11/14/2018   Procedure: POLYPECTOMY;  Surgeon: Danie Binder, MD;  [facility];  Service: Endoscopy;;   PORT-A-CATH REMOVAL N/A 07/20/2018   Procedure: REMOVAL PORT-A-CATH;  Surgeon: Aviva Signs, MD;  Location: [facility];  Service: General;  Laterality: N/A;   PORTACATH PLACEMENT N/A 12/23/2016   Procedure: PLACEMENT OF TUNNELED CENTRAL VENOUS CATHETER RIGHT INTERNAL JUGULAR WITH SUBCUTANEOUS PORT;  Surgeon: Vickie Epley, MD;  Location: [facility];  Service: Vascular;  Laterality: N/A;   reattatchment of left arm     from [place] 11/19/2016   Procedure: TRANSURETHRAL RESECTION OF BLADDER TUMOR (TURBT) WITH EPIRUBICIN INJECTION;  Surgeon: Franchot Gallo, MD;  Location: [facility];  Service: Urology;  Laterality: N/A;    Current Outpatient Medications  Medication Sig Dispense Refill    albuterol (VENTOLIN HFA) 108 (90 Base) MCG/ACT inhaler Inhale 1-2 puffs into the lungs every 6 (six) hours as needed.     amLODipine (NORVASC) 5 MG tablet Take 5 mg by mouth daily.     cholecalciferol (VITAMIN D) 25 MCG (1000 UT) tablet Take 1,000 Units by mouth daily.   4   FERREX 150 150 MG capsule Take 150 mg by mouth daily.     GNP ASPIRIN LOW DOSE 81 MG EC tablet TAKE 1 TABLET BY MOUTH ONCE DAILY. 30 tablet 11   hydrochlorothiazide (HYDRODIURIL) 12.5 MG tablet Take 1 tablet by mouth daily.     mirtazapine (REMERON) 30 MG tablet Take 1 tablet by mouth at bedtime.     morphine (MS CONTIN) 15 MG 12 hr tablet Take 15 mg by mouth 2 (two) times daily as needed for pain.     OVER THE COUNTER MEDICATION B 12 once per day.     Oxycodone HCl 10 MG TABS Take 1-2 tablets (10-20 mg total) by mouth every 4 (four) hours as needed (pain). Post-operatively 40 tablet 0   polyethylene glycol (MIRALAX) packet Take 17 g by mouth daily. 28 each 1   sodium bicarbonate 650 MG tablet Take 650 mg by  mouth 2 (two) times daily.     sodium zirconium cyclosilicate (LOKELMA) 10 g PACK packet Take 1 packet by mouth 3 (three) times daily.     vitamin B-12 (CYANOCOBALAMIN) 50 MCG tablet Take 50 mcg by mouth daily.     atorvastatin (LIPITOR) 10 MG tablet Take 1 tablet (10 mg total) by mouth daily. 30 tablet 11   No current facility-administered medications for this visit.    Allergies as of 06/04/2022 - Review Complete 06/04/2022  Allergen Reaction Noted   Penicillins Anaphylaxis and Other (See Comments) 12/20/2020   Ciprofloxacin Itching 11/19/2016   Statins Other (See Comments) 12/20/2020   Ambien [zolpidem tartrate] Other (See Comments) 04/28/2017   Oxycodone Nausea Only 04/19/2020   Zolpidem  04/28/2017    Family History  Problem Relation Age of Onset   Aneurysm Mother    Diabetes Father    Colon cancer Brother     Social History   Socioeconomic History   Marital status: Married    Spouse name: Not on  file   Number of children: Not on file   Years of education: Not on file   Highest education level: Not on file  Occupational History   Not on file  Tobacco Use   Smoking status: Every Day    Packs/day: 1.00    Years: 50.00    Total pack years: 50.00    Types: Cigarettes    Last attempt to quit: 10/30/2018    Years since quitting: 3.5    Passive exposure: Current   Smokeless tobacco: Never  Vaping Use   Vaping Use: Never used  Substance and Sexual Activity   Alcohol use: No   Drug use: No   Sexual activity: Never    Birth control/protection: None    Comment: married-30 years-2 children by first wife  Other Topics Concern   Not on file  Social History Narrative   Not on file   Social Determinants of Health   Financial Resource Strain: Medium Risk (01/15/2021)   Overall Financial Resource Strain (CARDIA)    Difficulty of Paying Living Expenses: Somewhat hard  Food Insecurity: No Food Insecurity (01/15/2021)   Hunger Vital Sign    Worried About Running Out of Food in the Last Year: Never true    Secretary in the Last Year: Never true  Transportation Needs: No Transportation Needs (01/15/2021)   PRAPARE - Hydrologist (Medical): No    Lack of Transportation (Non-Medical): No  Physical Activity: Sufficiently Active (01/15/2021)   Exercise Vital Sign    Days of Exercise per Week: 7 days    Minutes of Exercise per Session: 60 min  Stress: Stress Concern Present (01/15/2021)   [place]    Feeling of Stress : To some extent  Social Connections: Moderately Integrated (01/15/2021)   Social Connection and Isolation Panel [NHANES]    Frequency of Communication with Friends and Family: Three times a week    Frequency of Social Gatherings with Friends and Family: Three times a week    Attends Religious Services: More than 4 times per year    Active Member of Clubs or Organizations: No     Attends Archivist Meetings: Never    Marital Status: Married   Review of systems General: negative for night sweats, fever, chills, weight loss +mild fatigue Neck: Negative for lumps, goiter, pain and significant neck swelling Resp: Negative for cough, wheezing, dyspnea  at rest CV: Negative for chest pain, leg swelling, palpitations, orthopnea GI: denies melena, hematochezia, nausea, vomiting, diarrhea,  dysphagia, odyonophagia, early satiety or unintentional weight loss. +constipation  MSK: Negative for joint pain or swelling, back pain, and muscle pain. Derm: Negative for itching or rash Psych: Denies depression, anxiety, memory loss, confusion. No homicidal or suicidal ideation.  Heme: Negative for prolonged bleeding, bruising easily, and swollen nodes. Endocrine: Negative for cold or heat intolerance, polyuria, polydipsia and goiter. Neuro: negative for tremor, gait imbalance, syncope and seizures. The remainder of the review of systems is noncontributory.  Physical Exam: BP (!) 162/83 (BP Location: Left Arm, Patient Position: Sitting, Cuff Size: Normal)   Pulse 84   Temp 98.4 F (36.9 C) (Oral)   Ht '5\' 10"'$  (1.778 m)   Wt 159 lb 12.8 oz (72.5 kg)   BMI 22.93 kg/m  General:   Alert and oriented. No distress noted. Pleasant and cooperative.  Head:  Normocephalic and atraumatic. Eyes:  Conjuctiva clear without scleral icterus. Mouth:  Oral mucosa pink and moist. Good dentition. No lesions. Heart: Normal rate and rhythm, s1 and s2 heart sounds present.  Lungs: Clear lung sounds in all lobes. Respirations equal and unlabored. Abdomen:  +BS, soft, non-tender and non-distended. No rebound or guarding. No HSM or masses noted. Derm: No palmar erythema or jaundice Msk:  Symmetrical without gross deformities. Normal posture. Extremities:  Without edema. Neurologic:  Alert and  oriented x4 Psych:  Alert and cooperative. Normal mood and affect.  Invalid input(s): "6 MONTHS"    ASSESSMENT: Jeffery Ponce is a 76 y.o. male presenting today for follow up of anemia and likely advanced liver disease/questionable cirrhosis.  Anemia: hgb stable at 12.7, denies rectal bleeding or melena, previous endoscopic evaluation with no findings to explain source of blood loss, likely combination of CKD and relative Iron deficiency. He continues to follow with Dr. Delton Coombes with heme/onc, last seen in Feb, Iron infusions stopped at that time (last in august 2022) as Ferritin 56, down from 78 in Aug, sat 24. Patient got IV Venofer x3 for total of 1 g thereafter. Has some mild fatigue but no dizziness, SOB, syncope. Should continue to follow with Dr. Raliegh Ip for routine monitoring of blood counts and iron studies.   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 Will repeat AFP and [place] in July. Most recent plt count also WNL. He has had no previous elevation of LFTs. Denies confusion, ascites, jaundice or pruritus.   Constipation: using constipation 2-3x/week with good results. Having a BM daily to every other day. Will continue with current regimen  PLAN:  1.repeat RUQ [place] July 2023  2. Repeat MELD and AFP July 2023 3. Continue to follow with hematology for IDA 4. Repeat LFTs in August  All questions were answered, patient verbalized understanding and is in agreement with plan as outlined above.   Follow Up: 6 months  Azlynn Mitnick L. Alver Sorrow, MSN, APRN, AGNP-C Adult-Gerontology Nurse Practitioner [facility]

## 2022-06-13 ENCOUNTER — Ambulatory Visit (INDEPENDENT_AMBULATORY_CARE_PROVIDER_SITE_OTHER): Payer: Medicare HMO | Admitting: Gastroenterology

## 2022-06-18 ENCOUNTER — Other Ambulatory Visit: Payer: Self-pay | Admitting: Gastroenterology

## 2022-06-18 ENCOUNTER — Ambulatory Visit (HOSPITAL_COMMUNITY)
Admission: RE | Admit: 2022-06-18 | Discharge: 2022-06-18 | Disposition: A | Discharge status: Home / Self Care | Payer: Medicare HMO | Source: Ambulatory Visit | Attending: Gastroenterology | Admitting: Gastroenterology

## 2022-06-18 DIAGNOSIS — R932 Abnormal findings on diagnostic imaging of liver and biliary tract: Secondary | ICD-10-CM | POA: Diagnosis not present

## 2022-06-19 LAB — AFP TUMOR MARKER: AFP, Serum, Tumor Marker: <1.8 ng/mL (ref 0.0–8.4)

## 2022-07-24 ENCOUNTER — Inpatient Hospital Stay: Payer: Medicare HMO | Attending: Hematology

## 2022-07-24 DIAGNOSIS — Z5986 Financial insecurity: Secondary | ICD-10-CM | POA: Insufficient documentation

## 2022-07-24 DIAGNOSIS — Z87442 Personal history of urinary calculi: Secondary | ICD-10-CM | POA: Insufficient documentation

## 2022-07-24 DIAGNOSIS — M2550 Pain in unspecified joint: Secondary | ICD-10-CM | POA: Diagnosis not present

## 2022-07-24 DIAGNOSIS — Z888 Allergy status to other drugs, medicaments and biological substances status: Secondary | ICD-10-CM | POA: Insufficient documentation

## 2022-07-24 DIAGNOSIS — N1832 Chronic kidney disease, stage 3b: Secondary | ICD-10-CM | POA: Insufficient documentation

## 2022-07-24 DIAGNOSIS — Z8249 Family history of ischemic heart disease and other diseases of the circulatory system: Secondary | ICD-10-CM | POA: Diagnosis not present

## 2022-07-24 DIAGNOSIS — Z885 Allergy status to narcotic agent status: Secondary | ICD-10-CM | POA: Diagnosis not present

## 2022-07-24 DIAGNOSIS — Z881 Allergy status to other antibiotic agents status: Secondary | ICD-10-CM | POA: Insufficient documentation

## 2022-07-24 DIAGNOSIS — Z800 Family history of malignant neoplasm of digestive organs: Secondary | ICD-10-CM | POA: Diagnosis not present

## 2022-07-24 DIAGNOSIS — Z79899 Other long term (current) drug therapy: Secondary | ICD-10-CM | POA: Diagnosis not present

## 2022-07-24 DIAGNOSIS — K8020 Calculus of gallbladder without cholecystitis without obstruction: Secondary | ICD-10-CM | POA: Diagnosis not present

## 2022-07-24 DIAGNOSIS — Z880 Allergy status to penicillin: Secondary | ICD-10-CM | POA: Diagnosis not present

## 2022-07-24 DIAGNOSIS — D649 Anemia, unspecified: Secondary | ICD-10-CM | POA: Diagnosis not present

## 2022-07-24 DIAGNOSIS — C679 Malignant neoplasm of bladder, unspecified: Secondary | ICD-10-CM | POA: Insufficient documentation

## 2022-07-24 DIAGNOSIS — Z833 Family history of diabetes mellitus: Secondary | ICD-10-CM | POA: Diagnosis not present

## 2022-07-24 DIAGNOSIS — F17210 Nicotine dependence, cigarettes, uncomplicated: Secondary | ICD-10-CM | POA: Diagnosis not present

## 2022-07-24 DIAGNOSIS — D500 Iron deficiency anemia secondary to blood loss (chronic): Secondary | ICD-10-CM

## 2022-07-24 LAB — CBC WITH DIFFERENTIAL/PLATELET
Abs Immature Granulocytes: 0.04 10*3/uL (ref 0.00–0.07)
Basophils Absolute: 0 10*3/uL (ref 0.0–0.1)
Basophils Relative: 0 %
Eosinophils Absolute: 0.3 10*3/uL (ref 0.0–0.5)
Eosinophils Relative: 3 %
HCT: 42.3 % (ref 39.0–52.0)
Hemoglobin: 13.6 g/dL (ref 13.0–17.0)
Immature Granulocytes: 0 %
Lymphocytes Relative: 18 %
Lymphs Abs: 1.9 10*3/uL (ref 0.7–4.0)
MCH: 29.6 pg (ref 26.0–34.0)
MCHC: 32.2 g/dL (ref 30.0–36.0)
MCV: 92.2 fL (ref 80.0–100.0)
Monocytes Absolute: 0.8 10*3/uL (ref 0.1–1.0)
Monocytes Relative: 7 %
Neutro Abs: 7.4 10*3/uL (ref 1.7–7.7)
Neutrophils Relative %: 72 %
Platelets: 314 10*3/uL (ref 150–400)
RBC: 4.59 MIL/uL (ref 4.22–5.81)
RDW: 12.6 % (ref 11.5–15.5)
WBC: 10.4 10*3/uL (ref 4.0–10.5)
nRBC: 0 % (ref 0.0–0.2)

## 2022-07-24 LAB — IRON AND TIBC
Iron: 52 ug/dL (ref 45–182)
Saturation Ratios: 18 % (ref 17.9–39.5)
TIBC: 294 ug/dL (ref 250–450)
UIBC: 242 ug/dL

## 2022-07-24 LAB — FERRITIN: Ferritin: 108 ng/mL (ref 24–336)

## 2022-07-31 ENCOUNTER — Inpatient Hospital Stay (HOSPITAL_BASED_OUTPATIENT_CLINIC_OR_DEPARTMENT_OTHER): Payer: Medicare HMO | Admitting: Hematology

## 2022-07-31 VITALS — BP 135/70 | HR 103 | Temp 98.1°F | Resp 18 | Ht 70.0 in | Wt 160.1 lb

## 2022-07-31 DIAGNOSIS — C679 Malignant neoplasm of bladder, unspecified: Secondary | ICD-10-CM | POA: Diagnosis not present

## 2022-07-31 DIAGNOSIS — D500 Iron deficiency anemia secondary to blood loss (chronic): Secondary | ICD-10-CM | POA: Diagnosis not present

## 2022-07-31 NOTE — Progress Notes (Signed)
[facility], [place] [id number]   CLINIC:  Medical Oncology/Hematology  PCP:  [facility] / [place] [id number] [phone]   REASON FOR VISIT:  Follow-up for bladder Ponce & normocytic anemia  PRIOR THERAPY:  1. Cisplatin and gemcitabine x 4 cycles from 12/30/2016 to 04/21/2017. 2. Cystoprostatectomy with ileal conduit on 07/30/2017.  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 Correlate with tissue sampling on cystoscopy.   No findings suspicious for upper tract disease.   Cholelithiasis, without associated inflammatory changes.   11/19/2016 Procedure   TURBT by Jeffery Ponce of a 2 centimeter anterior bladder wall tumor, placement of epirubicin intravesical   11/19/2016 Procedure   50 milligrams of epirubicin and 50 mL of diluent.  This was left indwelling for 1 hour by Jeffery Ponce   11/22/2016 Pathology Results   Bladder, transurethral resection, bladder tumor INFILTRATING HIGH GRADE UROTHELIAL CARCINOMA THE CARCINOMA INVADES MUSCULARIS PROPRIA (DETRUSOR MUSCLE) Microscopic Comment The neoplasm stains positive for high molecular weight cytokeratin, p63 and negative for prostein. The immunostain pattern supports the diagnosis of urothelial carcinoma.   12/18/2016 Imaging   Bilateral interstitial prominence. Active pneumonitis cannot be excluded. These changes could be also be related chronic interstitial lung disease.   12/30/2016 - 04/28/2017 Chemotherapy   The patient had palonosetron (ALOXI) injection 0.25 mg, 0.25 mg, Intravenous,  Once, 4 of 4 cycles  CISplatin  (PLATINOL) 138 mg in sodium chloride 0.9 % 500 mL chemo infusion, 70 mg/m2 = 138 mg, Intravenous,  Once, 4 of 4 cycles  gemcitabine (GEMZAR) 1,976 mg in sodium chloride 0.9 % 250 mL chemo infusion, 1,000 mg/m2 = 1,976 mg, Intravenous,  Once, 4 of 4 cycles Dose modification: 800 mg/m2 (80 % of original dose 1,000 mg/m2, Cycle 3, Reason: Provider Judgment, Comment: Thrombocytopenia)  fosaprepitant (EMEND) 150 mg, dexamethasone (DECADRON) 12 mg in sodium chloride 0.9 % 145 mL IVPB, , Intravenous,  Once, 4 of 4 cycles  for chemotherapy treatment.     02/17/2017 Treatment Plan Change   Treatment deferred x 7 days due to thrombocytopenia   02/17/2017 Treatment Plan Change   Gemcitabine dose reduced by 20% due to thrombocytopenia   03/10/2017 Treatment Plan Change   Cisplatin dose-reduced by 20%   04/28/2017 Treatment Plan Change   Day 15 of cycle #4 is cancelled due to thrombocytopenia (68,000).   05/02/2017 Imaging   CT abd/pelvis at [facility]-no evidence of metastatic disease or recurrent focal urothelial lesion on noncontrast imaging.  There is mild diffuse bladder wall thickening.  Urothelial evaluation limited without contrast.  Cholelithiasis, sigmoid diverticulosis, aortic atherosclerosis.   07/30/2017 Definitive Surgery   Cystoprostatectomy by Dr. Tresa Ponce with curative intent.   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 Several mildly enlarged lymph nodes are present including a 1.2 cm peripancreatic lymph node and a 1.4 cm low-density left external iliac lymph node or postoperative fluid collection. These merit surveillance. 2. A right middle lobe subpleural nodule along the minor fissure as a volume of 100 cubic mm. This level has not been included in prior cross-sectional imaging to assess for stability. Guidelines for follow up do not specifically apply due to history of malignancy. Well likely a benign subpleural lymph node, surveillance is likely warranted. 3. Other imaging findings of potential clinical significance: Aortic Atherosclerosis (ICD10-I70.0) and Emphysema (ICD10-J43.9). Coronary atherosclerosis with mitral and aortic valve calcification. Borderline appearance for distal esophageal wall thickening, query reflux. Several small left lower lobe nodules in the 3-4 mm range are probably inflammatory. Cholelithiasis. Cystoprostatectomy with loop ileostomy. Sigmoid diverticulosis. Notable common and external iliac stenosis bilaterally due to atherosclerosis. Lumbar impingement at L2- 3, L3-4, and L4-5.     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, a 76 y.o. male, returns for follow-up of normocytic anemia and bladder Ponce.  Last Venofer infusion was on 02/12/2022.  Energy levels are still good and he reports the mid 70%.  Denies any bleeding per rectum or melena.  REVIEW OF SYSTEMS:  Review of Systems  Constitutional:  Negative for appetite change and fatigue.  HENT:   Negative for nosebleeds.   Respiratory:  Negative for hemoptysis.   Gastrointestinal:  Negative for blood in stool.  Genitourinary:  Negative for hematuria.   Musculoskeletal:  Positive for arthralgias (5/10).  Hematological:  Does not bruise/bleed easily.  All other systems reviewed and are negative.   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[place]) 12/10/2016   Past Surgical History:  Procedure Laterality Date   BIOPSY  11/13/2018   Procedure: BIOPSY;  Surgeon: Danie Binder, MD;  [facility];  Service: Endoscopy;;  gastric bx's   BIOPSY  12/22/2019   Procedure: BIOPSY;  Surgeon: Rogene Houston, MD;  [facility];  Service: Endoscopy;;  gastric   CARDIAC CATHETERIZATION     COLONOSCOPY WITH PROPOFOL N/A 11/14/2018   Procedure: COLONOSCOPY WITH PROPOFOL;  Surgeon: Danie Binder, MD;  [facility];  Service: Endoscopy;  Laterality: N/A;   CYSTOSCOPY WITH INJECTION N/A 07/30/2017   Procedure: CYSTOSCOPY WITH  INJECTION OF INDOCYANINE GREEN DYE;  Surgeon: Alexis Frock, MD;  Location: [facility];  Service: Ponce;  Laterality: N/A;   ESOPHAGOGASTRODUODENOSCOPY N/A 12/22/2019   Procedure: ESOPHAGOGASTRODUODENOSCOPY (EGD);  Surgeon:  Rogene Houston, MD;  [facility];  Service: Endoscopy;  Laterality: N/A;  1:00   ESOPHAGOGASTRODUODENOSCOPY (EGD) WITH PROPOFOL N/A 11/13/2018   Procedure: ESOPHAGOGASTRODUODENOSCOPY (EGD) WITH PROPOFOL;  Surgeon: Danie Binder, MD;  [facility];  Service: Endoscopy;  Laterality: N/A;   FEMUR FRACTURE SURGERY Right    GIVENS CAPSULE STUDY N/A 10/29/2019   Procedure: GIVENS CAPSULE STUDY;  Surgeon: Rogene Houston, MD;  [facility];  Service: Endoscopy;  Laterality: N/A;  730am   GIVENS CAPSULE STUDY N/A 12/22/2019   Procedure: GIVENS CAPSULE STUDY;  Surgeon: Rogene Houston, MD;  [facility];  Service: Endoscopy;  Laterality: N/A;   LOWER EXTREMITY ANGIOGRAPHY Right 10/19/2018   Procedure: LOWER EXTREMITY ANGIOGRAPHY;  Surgeon: Algernon Huxley, MD;  Location: [facility];  Service: Cardiovascular;  Laterality: Right;   MCT 3D RECONSTRUCTION ([facility] HX) Left    arm   open reduction and internal fixation leg Right    hip and leg.   POLYPECTOMY  11/14/2018   Procedure: POLYPECTOMY;  Surgeon: Danie Binder, MD;  [facility];  Service: Endoscopy;;   PORT-A-CATH REMOVAL N/A 07/20/2018   Procedure: REMOVAL PORT-A-CATH;  Surgeon: Aviva Signs, MD;  Location: [facility];  Service: General;  Laterality: N/A;   PORTACATH PLACEMENT N/A 12/23/2016   Procedure: PLACEMENT OF TUNNELED CENTRAL VENOUS CATHETER RIGHT INTERNAL JUGULAR WITH SUBCUTANEOUS PORT;  Surgeon: Vickie Epley, MD;  Location: [facility];  Service: Vascular;  Laterality: N/A;   reattatchment of left arm     from [place] 11/19/2016   Procedure: TRANSURETHRAL RESECTION OF BLADDER TUMOR (TURBT) WITH EPIRUBICIN INJECTION;  Surgeon: Franchot Gallo, MD;  Location: [facility];  Service: Ponce;  Laterality: N/A;    SOCIAL HISTORY:  Social History   Socioeconomic History   Marital status: Married    Spouse name: Not on file    Number of children: Not on file   Years of education: Not on file   Highest education level: Not on file  Occupational History   Not on file  Tobacco Use   Smoking status: Every Day    Packs/day: 1.00    Years: 50.00    Total pack years: 50.00    Types: Cigarettes    Last attempt to quit: 10/30/2018    Years since quitting: 3.7    Passive exposure: Current   Smokeless tobacco: Never  Vaping Use   Vaping Use: Never used  Substance and Sexual Activity   Alcohol use: No   Drug use: No   Sexual activity: Never    Birth control/protection: None    Comment: married-30 years-2 children by first wife  Other Topics Concern   Not on file  Social History Narrative   Not on file   Social Determinants of Health   Financial Resource Strain: Medium Risk (01/15/2021)   Overall Financial Resource Strain (CARDIA)    Difficulty of Paying Living Expenses: Somewhat hard  Food Insecurity: No Food Insecurity (01/15/2021)   Hunger Vital Sign    Worried About Running Out of Food in the Last Year: Never true    Ran Out of Food in the Last Year: Never true  Transportation Needs: No Transportation Needs (01/15/2021)  PRAPARE - Hydrologist (Medical): No    Lack of Transportation (Non-Medical): No  Physical Activity: Sufficiently Active (01/15/2021)   Exercise Vital Sign    Days of Exercise per Week: 7 days    Minutes of Exercise per Session: 60 min  Stress: Stress Concern Present (01/15/2021)   [place]    Feeling of Stress : To some extent  Social Connections: Moderately Integrated (01/15/2021)   Social Connection and Isolation Panel [NHANES]    Frequency of Communication with Friends and Family: Three times a week    Frequency of Social Gatherings with Friends and Family: Three times a week    Attends Religious Services: More than 4 times per year    Active Member of Clubs or Organizations: No    Attends  Archivist Meetings: Never    Marital Status: Married  Human resources officer Violence: Not At Risk (01/15/2021)   Humiliation, Afraid, Rape, and Kick questionnaire    Fear of Current or Ex-Partner: No    Emotionally Abused: No    Physically Abused: No    Sexually Abused: No    FAMILY HISTORY:  Family History  Problem Relation Age of Onset   Aneurysm Mother    Diabetes Father    Colon Ponce Brother     CURRENT MEDICATIONS:  Current Outpatient Medications  Medication Sig Dispense Refill   albuterol (VENTOLIN HFA) 108 (90 Base) MCG/ACT inhaler Inhale 1-2 puffs into the lungs every 6 (six) hours as needed.     amLODipine (NORVASC) 5 MG tablet Take 5 mg by mouth daily.     cholecalciferol (VITAMIN D) 25 MCG (1000 UT) tablet Take 1,000 Units by mouth daily.   4   FERREX 150 150 MG capsule Take 150 mg by mouth daily.     GNP ASPIRIN LOW DOSE 81 MG EC tablet TAKE 1 TABLET BY MOUTH ONCE DAILY. 30 tablet 11   hydrochlorothiazide (HYDRODIURIL) 12.5 MG tablet Take 1 tablet by mouth daily.     mirtazapine (REMERON) 30 MG tablet Take 1 tablet by mouth at bedtime.     morphine (MS CONTIN) 15 MG 12 hr tablet Take 15 mg by mouth 2 (two) times daily as needed for pain.     OVER THE COUNTER MEDICATION B 12 once per day.     Oxycodone HCl 10 MG TABS Take 1-2 tablets (10-20 mg total) by mouth every 4 (four) hours as needed (pain). Post-operatively 40 tablet 0   polyethylene glycol (MIRALAX) packet Take 17 g by mouth daily. 28 each 1   sodium bicarbonate 650 MG tablet Take 650 mg by mouth 2 (two) times daily.     sodium zirconium cyclosilicate (LOKELMA) 10 g PACK packet Take 1 packet by mouth 3 (three) times daily.     vitamin B-12 (CYANOCOBALAMIN) 50 MCG tablet Take 50 mcg by mouth daily.     atorvastatin (LIPITOR) 10 MG tablet Take 1 tablet (10 mg total) by mouth daily. 30 tablet 11   No current facility-administered medications for this visit.    ALLERGIES:  Allergies  Allergen Reactions    Penicillins Anaphylaxis and Other (See Comments)    Has patient had a PCN reaction causing immediate rash, facial/tongue/throat swelling, SOB or lightheadedness with hypotension:Yes Has patient had a PCN reaction causing severe rash involving mucus membranes or skin necrosis:Yes Has patient had a PCN reaction that required hospitalization:No Has patient had a PCN reaction occurring within  the last 10 years:No If all of the above answers are "NO", then may proceed with Cephalosporin use.    Ciprofloxacin Itching    Itching at IV site. No hives or shortness of breathe.   Statins Other (See Comments)   Ambien [Zolpidem Tartrate] Other (See Comments)    "Sleep walking"   Oxycodone Nausea Only   Zolpidem     Other reaction(s): Other (see comments) "Sleep walking"    PHYSICAL EXAM:  Performance status (ECOG): 1 - Symptomatic but completely ambulatory  Vitals:   07/31/22 1414  BP: 135/70  Pulse: (!) 103  Resp: 18  Temp: 98.1 F (36.7 C)  SpO2: 98%   Wt Readings from Last 3 Encounters:  07/31/22 160 lb 1.6 oz (72.6 kg)  06/04/22 159 lb 12.8 oz (72.5 kg)  02/12/22 156 lb 14.4 oz (71.2 kg)   Physical Exam Vitals reviewed.  Constitutional:      Appearance: Normal appearance.  Cardiovascular:     Rate and Rhythm: Normal rate and regular rhythm.     Pulses: Normal pulses.     Heart sounds: Normal heart sounds.  Pulmonary:     Effort: Pulmonary effort is normal.     Breath sounds: Normal breath sounds.  Neurological:     General: No focal deficit present.     Mental Status: He is alert and oriented to person, place, and time.  Psychiatric:        Mood and Affect: Mood normal.        Behavior: Behavior normal.      LABORATORY DATA:  I have reviewed the labs as listed.     Latest Ref Rng & Units 07/24/2022   12:52 PM 01/17/2022    1:22 PM 07/12/2021   12:08 PM  CBC  WBC 4.0 - 10.5 K/uL 10.4  12.4  10.2   Hemoglobin 13.0 - 17.0 g/dL 13.6  12.8  12.8   Hematocrit 39.0 -  52.0 % 42.3  40.1  41.0   Platelets 150 - 400 K/uL 314  299  249       Latest Ref Rng & Units 01/17/2022    1:22 PM 12/19/2021    9:18 AM 07/12/2021   12:08 PM  CMP  Glucose 70 - 99 mg/dL 115  95  91   BUN 8 - 23 mg/dL 53  21  22   Creatinine 0.61 - 1.24 mg/dL 3.06  1.71  1.93   Sodium 135 - 145 mmol/L 133  139  139   Potassium 3.5 - 5.1 mmol/L 4.8  5.2  4.4   Chloride 98 - 111 mmol/L 106  101  105   CO2 22 - 32 mmol/L '18  21  26   '$ Calcium 8.9 - 10.3 mg/dL 8.8  9.4  9.4   Total Protein 6.5 - 8.1 g/dL 7.3  7.2  7.7   Total Bilirubin 0.3 - 1.2 mg/dL 0.4  0.3  0.5   Alkaline Phos 38 - 126 U/L 103  138  99   AST 15 - 41 U/L '15  13  13   '$ ALT 0 - 44 U/L '14  9  8     '$ DIAGNOSTIC IMAGING:  I have independently reviewed the scans and discussed with the patient. No results found.   ASSESSMENT:  1.  Normocytic anemia: -Combination anemia from CKD and IDA. -Receives intermittent parenteral iron therapy.  Last Venofer was on 01/05/2020. -EGD on 12/22/2019 showed normal esophagus, multiple petechial hemorrhages noted at gastric body  and antrum.  Normal duodenal bulb and second part of duodenum. -Capsule study on 12/22/2019 showed multiple petechial hemorrhages involving gastric mucosa, fresh blood noted on images likely from trauma as the capsule forced across the pylorus.  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  Dark black liquid noted in the distal small bowel likely due to iron.   2.  Bladder Ponce: -TURBT for T2 a N0 urothelial Ponce on 11/19/2016. -As there was suspicion for muscle invasion, underwent cisplatin and gemcitabine 4 cycles from 12/30/2016 through 04/21/2017 followed by cystoprostatectomy and ileal conduit on 07/30/2017, showing residual disease of 0.4 cm. -CT CAP on 06/20/2020 with no findings of recurrence or metastatic disease.  Hepatic morphology with mild cirrhosis cannot be excluded.  Similar bilateral pulmonary nodules, favoring benign etiology.  Slight enlargement of mediastinal node  which is not pathologic considered reactive.   PLAN:  1.  Normocytic anemia: - He received 3 dose of Venofer from 01/29/2022 through 02/12/2022. - Reviewed labs from 07/24/2022: Ferritin is 108, percent saturation 18.  Hemoglobin 13.6.  No indication for parenteral iron therapy.  RTC 6 months for follow-up with repeat labs.   2.  Bladder Ponce: - CTAP on 07/13/2021: Right lower quadrant ileal conduit with no evidence of recurrence.  3.  CKD: - Last creatinine was 3.06.  Continue follow-up with Dr. Posey Pronto.   Orders placed this encounter:  Orders Placed This Encounter  Procedures   CBC with Differential/Platelet   Ferritin   Iron and TIBC      Derek Jack, MD [place] [phone]

## 2022-07-31 NOTE — Patient Instructions (Signed)
[place] at [facility] Discharge Instructions  You were seen and examined today by Dr. Delton Coombes.  Dr. Delton Coombes discussed your most recent lab work and everything looks good.  Follow-up as scheduled in 6 months.    Thank you for choosing [place] at [facility] to provide your oncology and hematology care.  To afford each patient quality time with our provider, please arrive at least 15 minutes before your scheduled appointment time.   If you have a lab appointment with the [facility] please come in thru the Main Entrance and check in at the main information desk.  You need to re-schedule your appointment should you arrive 10 or more minutes late.  We strive to give you quality time with our providers, and arriving late affects you and other patients whose appointments are after yours.  Also, if you no show three or more times for appointments you may be dismissed from the clinic at the providers discretion.     Again, thank you for choosing [facility].  Our hope is that these requests will decrease the amount of time that you wait before being seen by our physicians.       _____________________________________________________________  Should you have questions after your visit to [facility], please contact our office at [phone] and follow the prompts.  Our office hours are 8:00 a.m. and 4:30 p.m. Monday - Friday.  Please note that voicemails left after 4:00 p.m. may not be returned until the following business day.  We are closed weekends and major holidays.  You do have access to a nurse 24-7, just call the main number to the clinic [phone] and do not press any options, hold on the line and a nurse will answer the phone.    For prescription refill requests, have your pharmacy contact our office and allow 72 hours.

## 2022-11-19 ENCOUNTER — Encounter (INDEPENDENT_AMBULATORY_CARE_PROVIDER_SITE_OTHER): Payer: Self-pay | Admitting: Gastroenterology

## 2022-11-27 ENCOUNTER — Encounter (INDEPENDENT_AMBULATORY_CARE_PROVIDER_SITE_OTHER): Payer: Self-pay | Admitting: *Deleted

## 2022-12-05 ENCOUNTER — Ambulatory Visit (INDEPENDENT_AMBULATORY_CARE_PROVIDER_SITE_OTHER): Payer: Medicare HMO | Admitting: Gastroenterology

## 2022-12-11 ENCOUNTER — Other Ambulatory Visit: Payer: Self-pay | Admitting: Hematology

## 2022-12-11 ENCOUNTER — Encounter: Payer: Self-pay | Admitting: *Deleted

## 2022-12-11 NOTE — Progress Notes (Addendum)
Patient called to advise fatigue.  Labs drawn by PCP 12/11 reviewed by Dr. Delton Coombes and patient will received Venofer 400 mg IV x 3 doses.

## 2022-12-16 ENCOUNTER — Telehealth (INDEPENDENT_AMBULATORY_CARE_PROVIDER_SITE_OTHER): Payer: Self-pay | Admitting: *Deleted

## 2022-12-16 DIAGNOSIS — R932 Abnormal findings on diagnostic imaging of liver and biliary tract: Secondary | ICD-10-CM

## 2022-12-16 NOTE — Telephone Encounter (Signed)
Received VM from pt to schedule RUQ [place] from letter he received.  Called pt and made aware of appt details. He voiced understanding

## 2022-12-18 ENCOUNTER — Inpatient Hospital Stay: Payer: Medicare HMO | Attending: Hematology

## 2022-12-18 VITALS — BP 110/52 | HR 62 | Temp 96.8°F | Resp 16

## 2022-12-18 DIAGNOSIS — N1832 Chronic kidney disease, stage 3b: Secondary | ICD-10-CM | POA: Diagnosis not present

## 2022-12-18 DIAGNOSIS — Z79899 Other long term (current) drug therapy: Secondary | ICD-10-CM | POA: Diagnosis not present

## 2022-12-18 DIAGNOSIS — D500 Iron deficiency anemia secondary to blood loss (chronic): Secondary | ICD-10-CM

## 2022-12-18 DIAGNOSIS — D631 Anemia in chronic kidney disease: Secondary | ICD-10-CM | POA: Insufficient documentation

## 2022-12-18 DIAGNOSIS — D649 Anemia, unspecified: Secondary | ICD-10-CM

## 2022-12-18 MED ORDER — FAMOTIDINE 20 MG PO TABS
20.0000 mg | ORAL_TABLET | Freq: Once | ORAL | Status: AC
  Administered 2022-12-18: 20 mg via ORAL
  Filled 2022-12-18: qty 1

## 2022-12-18 MED ORDER — LORATADINE 10 MG PO TABS
10.0000 mg | ORAL_TABLET | Freq: Once | ORAL | Status: AC
  Administered 2022-12-18: 10 mg via ORAL
  Filled 2022-12-18: qty 1

## 2022-12-18 MED ORDER — SODIUM CHLORIDE 0.9 % IV SOLN
300.0000 mg | Freq: Once | INTRAVENOUS | Status: AC
  Administered 2022-12-18: 300 mg via INTRAVENOUS
  Filled 2022-12-18: qty 300

## 2022-12-18 MED ORDER — ACETAMINOPHEN 325 MG PO TABS
650.0000 mg | ORAL_TABLET | Freq: Once | ORAL | Status: AC
  Administered 2022-12-18: 650 mg via ORAL
  Filled 2022-12-18: qty 2

## 2022-12-18 MED ORDER — SODIUM CHLORIDE 0.9 % IV SOLN: Freq: Once | INTRAVENOUS | Status: AC

## 2022-12-18 NOTE — Patient Instructions (Signed)
[facility]  Discharge Instructions: Thank you for choosing [facility] to provide your oncology and hematology care.  If you have a lab appointment with the Cancer Center, please come in thru the Main Entrance and check in at the main information desk.  Wear comfortable clothing and clothing appropriate for easy access to any Portacath or PICC line.   We strive to give you quality time with your provider. You may need to reschedule your appointment if you arrive late (15 or more minutes).  Arriving late affects you and other patients whose appointments are after yours.  Also, if you miss three or more appointments without notifying the office, you may be dismissed from the clinic at the provider's discretion.      For prescription refill requests, have your pharmacy contact our office and allow 72 hours for refills to be completed.    Today you received the following chemotherapy and/or immunotherapy agents Venofer      To help prevent nausea and vomiting after your treatment, we encourage you to take your nausea medication as directed.  BELOW ARE SYMPTOMS THAT SHOULD BE REPORTED IMMEDIATELY: *FEVER GREATER THAN 100.4 F (38 C) OR HIGHER *CHILLS OR SWEATING *NAUSEA AND VOMITING THAT IS NOT CONTROLLED WITH YOUR NAUSEA MEDICATION *UNUSUAL SHORTNESS OF BREATH *UNUSUAL BRUISING OR BLEEDING *URINARY PROBLEMS (pain or burning when urinating, or frequent urination) *BOWEL PROBLEMS (unusual diarrhea, constipation, pain near the anus) TENDERNESS IN MOUTH AND THROAT WITH OR WITHOUT PRESENCE OF ULCERS (sore throat, sores in mouth, or a toothache) UNUSUAL RASH, SWELLING OR PAIN  UNUSUAL VAGINAL DISCHARGE OR ITCHING   Items with * indicate a potential emergency and should be followed up as soon as possible or go to the Emergency Department if any problems should occur.  Please show the CHEMOTHERAPY ALERT CARD or IMMUNOTHERAPY ALERT CARD at check-in to the Emergency  Department and triage nurse.  Should you have questions after your visit or need to cancel or reschedule your appointment, please contact [facility] [phone]  and follow the prompts.  Office hours are 8:00 a.m. to 4:30 p.m. Monday - Friday. Please note that voicemails left after 4:00 p.m. may not be returned until the following business day.  We are closed weekends and major holidays. You have access to a nurse at all times for urgent questions. Please call the main number to the clinic [phone] and follow the prompts.  For any non-urgent questions, you may also contact your provider using MyChart. We now offer e-Visits for anyone 18 and older to request care online for non-urgent symptoms. For details visit [email or web address].   Also download the MyChart app! Go to the app store, search "MyChart", open the app, select [facility], and log in with your MyChart username and password.   

## 2022-12-18 NOTE — Progress Notes (Signed)
Patient presents today for Venofer infusion per providers order.  Vital signs WNL.  Patient has no new complaints at this time.  Peripheral IV started and blood return noted pre and post infusion.  Stable during infusion without adverse affects.  Vital signs stable.  No complaints at this time.  Discharge from clinic ambulatory in stable condition.  Alert and oriented X 3.  Follow up with [facility] as scheduled.  

## 2022-12-24 ENCOUNTER — Other Ambulatory Visit (INDEPENDENT_AMBULATORY_CARE_PROVIDER_SITE_OTHER): Payer: Self-pay | Admitting: *Deleted

## 2022-12-24 ENCOUNTER — Telehealth (INDEPENDENT_AMBULATORY_CARE_PROVIDER_SITE_OTHER): Payer: Self-pay | Admitting: *Deleted

## 2022-12-24 DIAGNOSIS — R932 Abnormal findings on diagnostic imaging of liver and biliary tract: Secondary | ICD-10-CM

## 2022-12-24 NOTE — Telephone Encounter (Signed)
Patient in reminder file for  AFP tumor marker due around January 17th, 2024 I called and discussed with patient and he wanted to do at [facility] and order mailed to him. Order put in and mailed.

## 2022-12-25 ENCOUNTER — Ambulatory Visit (HOSPITAL_COMMUNITY)
Admission: RE | Admit: 2022-12-25 | Discharge: 2022-12-25 | Disposition: A | Discharge status: Home / Self Care | Payer: Medicare HMO | Source: Ambulatory Visit | Attending: Gastroenterology | Admitting: Gastroenterology

## 2022-12-25 DIAGNOSIS — R932 Abnormal findings on diagnostic imaging of liver and biliary tract: Secondary | ICD-10-CM | POA: Insufficient documentation

## 2022-12-26 ENCOUNTER — Inpatient Hospital Stay: Payer: Medicare HMO

## 2022-12-26 VITALS — BP 155/75 | HR 77 | Temp 98.6°F | Resp 16

## 2022-12-26 DIAGNOSIS — D649 Anemia, unspecified: Secondary | ICD-10-CM

## 2022-12-26 DIAGNOSIS — D500 Iron deficiency anemia secondary to blood loss (chronic): Secondary | ICD-10-CM

## 2022-12-26 DIAGNOSIS — N1832 Chronic kidney disease, stage 3b: Secondary | ICD-10-CM | POA: Diagnosis not present

## 2022-12-26 MED ORDER — SODIUM CHLORIDE 0.9 % IV SOLN: Freq: Once | INTRAVENOUS | Status: AC

## 2022-12-26 MED ORDER — LORATADINE 10 MG PO TABS
10.0000 mg | ORAL_TABLET | Freq: Once | ORAL | Status: AC
  Administered 2022-12-26: 10 mg via ORAL
  Filled 2022-12-26: qty 1

## 2022-12-26 MED ORDER — SODIUM CHLORIDE 0.9 % IV SOLN
300.0000 mg | Freq: Once | INTRAVENOUS | Status: AC
  Administered 2022-12-26: 300 mg via INTRAVENOUS
  Filled 2022-12-26: qty 300

## 2022-12-26 MED ORDER — ACETAMINOPHEN 325 MG PO TABS
650.0000 mg | ORAL_TABLET | Freq: Once | ORAL | Status: AC
  Administered 2022-12-26: 650 mg via ORAL
  Filled 2022-12-26: qty 2

## 2022-12-26 MED ORDER — FAMOTIDINE 20 MG PO TABS
20.0000 mg | ORAL_TABLET | Freq: Once | ORAL | Status: AC
  Administered 2022-12-26: 20 mg via ORAL
  Filled 2022-12-26: qty 1

## 2022-12-26 NOTE — Patient Instructions (Signed)
[facility] [place]  Discharge Instructions: Thank you for choosing [facility] to provide your oncology and hematology care.  If you have a lab appointment with the Cancer Center, please come in thru the Main Entrance and check in at the main information desk.  Wear comfortable clothing and clothing appropriate for easy access to any Portacath or PICC line.   We strive to give you quality time with your provider. You may need to reschedule your appointment if you arrive late (15 or more minutes).  Arriving late affects you and other patients whose appointments are after yours.  Also, if you miss three or more appointments without notifying the office, you may be dismissed from the clinic at the provider's discretion.      For prescription refill requests, have your pharmacy contact our office and allow 72 hours for refills to be completed.    Today you received the following chemotherapy and/or immunotherapy agents Venofer 300 mg . Iron Sucrose Injection What is this medication? IRON SUCROSE (EYE ern SOO krose) treats low levels of iron (iron deficiency anemia) in people with kidney disease. Iron is a mineral that plays an important role in making red blood cells, which carry oxygen from your lungs to the rest of your body. This medicine may be used for other purposes; ask your health care provider or pharmacist if you have questions. COMMON BRAND NAME(S): Venofer What should I tell my care team before I take this medication? They need to know if you have any of these conditions: Anemia not caused by low iron levels Heart disease High levels of iron in the blood Kidney disease Liver disease An unusual or allergic reaction to iron, other medications, foods, dyes, or preservatives Pregnant or trying to get pregnant Breastfeeding How should I use this medication? This medication is for infusion into a vein. It is given in a hospital or clinic setting. Talk to  your care team about the use of this medication in children. While this medication may be prescribed for children as young as 2 years for selected conditions, precautions do apply. Overdosage: If you think you have taken too much of this medicine contact a poison control center or emergency room at once. NOTE: This medicine is only for you. Do not share this medicine with others. What if I miss a dose? Keep appointments for follow-up doses. It is important not to miss your dose. Call your care team if you are unable to keep an appointment. What may interact with this medication? Do not take this medication with any of the following: Deferoxamine Dimercaprol Other iron products This medication may also interact with the following: Chloramphenicol Deferasirox This list may not describe all possible interactions. Give your health care provider a list of all the medicines, herbs, non-prescription drugs, or dietary supplements you use. Also tell them if you smoke, drink alcohol, or use illegal drugs. Some items may interact with your medicine. What should I watch for while using this medication? Visit your care team regularly. Tell your care team if your symptoms do not start to get better or if they get worse. You may need blood work done while you are taking this medication. You may need to follow a special diet. Talk to your care team. Foods that contain iron include: whole grains/cereals, dried fruits, beans, or peas, leafy green vegetables, and organ meats (liver, kidney). What side effects may I notice from receiving this medication? Side effects that you should report   to your care team as soon as possible: Allergic reactions--skin rash, itching, hives, swelling of the face, lips, tongue, or throat Low blood pressure--dizziness, feeling faint or lightheaded, blurry vision Shortness of breath Side effects that usually do not require medical attention (report to your care team if they continue  or are bothersome): Flushing Headache Joint pain Muscle pain Nausea Pain, redness, or irritation at injection site This list may not describe all possible side effects. Call your doctor for medical advice about side effects. You may report side effects to FDA at 1-800-FDA-1088. Where should I keep my medication? This medication is given in a hospital or clinic and will not be stored at home. NOTE: This sheet is a summary. It may not cover all possible information. If you have questions about this medicine, talk to your doctor, pharmacist, or health care provider.  2023 Elsevier/Gold Standard (2021-03-08 00:00:00)       To help prevent nausea and vomiting after your treatment, we encourage you to take your nausea medication as directed.  BELOW ARE SYMPTOMS THAT SHOULD BE REPORTED IMMEDIATELY: *FEVER GREATER THAN 100.4 F (38 C) OR HIGHER *CHILLS OR SWEATING *NAUSEA AND VOMITING THAT IS NOT CONTROLLED WITH YOUR NAUSEA MEDICATION *UNUSUAL SHORTNESS OF BREATH *UNUSUAL BRUISING OR BLEEDING *URINARY PROBLEMS (pain or burning when urinating, or frequent urination) *BOWEL PROBLEMS (unusual diarrhea, constipation, pain near the anus) TENDERNESS IN MOUTH AND THROAT WITH OR WITHOUT PRESENCE OF ULCERS (sore throat, sores in mouth, or a toothache) UNUSUAL RASH, SWELLING OR PAIN  UNUSUAL VAGINAL DISCHARGE OR ITCHING   Items with * indicate a potential emergency and should be followed up as soon as possible or go to the Emergency Department if any problems should occur.  Please show the CHEMOTHERAPY ALERT CARD or IMMUNOTHERAPY ALERT CARD at check-in to the Emergency Department and triage nurse.  Should you have questions after your visit or need to cancel or reschedule your appointment, please contact [facility] [phone]  and follow the prompts.  Office hours are 8:00 a.m. to 4:30 p.m. Monday - Friday. Please note that voicemails left after 4:00 p.m. may not be returned  until the following business day.  We are closed weekends and major holidays. You have access to a nurse at all times for urgent questions. Please call the main number to the clinic [phone] and follow the prompts.  For any non-urgent questions, you may also contact your provider using MyChart. We now offer e-Visits for anyone 18 and older to request care online for non-urgent symptoms. For details visit [email or web address].   Also download the MyChart app! Go to the app store, search "MyChart", open the app, select [place], and log in with your MyChart username and password.   

## 2022-12-26 NOTE — Progress Notes (Signed)
Left message to return call 

## 2022-12-26 NOTE — Progress Notes (Signed)
Venofer infusion given per orders. Patient tolerated it well without problems. Vitals stable and discharged home from clinic ambulatory. Follow up as scheduled.

## 2022-12-26 NOTE — Progress Notes (Signed)
Patient presents today for Venofer 300 mg infusion. Vital signs stable. Patient denies any side effects related to iron infusions. MAR reviewed and updated. Patient has no complaints today.

## 2023-01-03 ENCOUNTER — Inpatient Hospital Stay: Payer: Medicare HMO

## 2023-01-03 VITALS — BP 94/47 | HR 60 | Temp 98.6°F | Resp 16

## 2023-01-03 DIAGNOSIS — D649 Anemia, unspecified: Secondary | ICD-10-CM

## 2023-01-03 DIAGNOSIS — N1832 Chronic kidney disease, stage 3b: Secondary | ICD-10-CM | POA: Diagnosis not present

## 2023-01-03 DIAGNOSIS — D500 Iron deficiency anemia secondary to blood loss (chronic): Secondary | ICD-10-CM

## 2023-01-03 MED ORDER — ACETAMINOPHEN 325 MG PO TABS
650.0000 mg | ORAL_TABLET | Freq: Once | ORAL | Status: AC
  Administered 2023-01-03: 650 mg via ORAL
  Filled 2023-01-03: qty 2

## 2023-01-03 MED ORDER — FAMOTIDINE 20 MG PO TABS
20.0000 mg | ORAL_TABLET | Freq: Once | ORAL | Status: AC
  Administered 2023-01-03: 20 mg via ORAL
  Filled 2023-01-03: qty 1

## 2023-01-03 MED ORDER — SODIUM CHLORIDE 0.9 % IV SOLN: INTRAVENOUS | Status: DC

## 2023-01-03 MED ORDER — LORATADINE 10 MG PO TABS
10.0000 mg | ORAL_TABLET | Freq: Once | ORAL | Status: AC
  Administered 2023-01-03: 10 mg via ORAL
  Filled 2023-01-03: qty 1

## 2023-01-03 MED ORDER — SODIUM CHLORIDE 0.9 % IV SOLN
400.0000 mg | Freq: Once | INTRAVENOUS | Status: AC
  Administered 2023-01-03: 400 mg via INTRAVENOUS
  Filled 2023-01-03: qty 20

## 2023-01-03 NOTE — Patient Instructions (Signed)
[facility] [place]  Discharge Instructions: Thank you for choosing [facility] to provide your oncology and hematology care.  If you have a lab appointment with the Cancer Center, please come in thru the Main Entrance and check in at the main information desk.  Wear comfortable clothing and clothing appropriate for easy access to any Portacath or PICC line.   We strive to give you quality time with your provider. You may need to reschedule your appointment if you arrive late (15 or more minutes).  Arriving late affects you and other patients whose appointments are after yours.  Also, if you miss three or more appointments without notifying the office, you may be dismissed from the clinic at the provider's discretion.      For prescription refill requests, have your pharmacy contact our office and allow 72 hours for refills to be completed.    Today you received the following Venofer, return as scheduled.   To help prevent nausea and vomiting after your treatment, we encourage you to take your nausea medication as directed.  BELOW ARE SYMPTOMS THAT SHOULD BE REPORTED IMMEDIATELY: *FEVER GREATER THAN 100.4 F (38 C) OR HIGHER *CHILLS OR SWEATING *NAUSEA AND VOMITING THAT IS NOT CONTROLLED WITH YOUR NAUSEA MEDICATION *UNUSUAL SHORTNESS OF BREATH *UNUSUAL BRUISING OR BLEEDING *URINARY PROBLEMS (pain or burning when urinating, or frequent urination) *BOWEL PROBLEMS (unusual diarrhea, constipation, pain near the anus) TENDERNESS IN MOUTH AND THROAT WITH OR WITHOUT PRESENCE OF ULCERS (sore throat, sores in mouth, or a toothache) UNUSUAL RASH, SWELLING OR PAIN  UNUSUAL VAGINAL DISCHARGE OR ITCHING   Items with * indicate a potential emergency and should be followed up as soon as possible or go to the Emergency Department if any problems should occur.  Please show the CHEMOTHERAPY ALERT CARD or IMMUNOTHERAPY ALERT CARD at check-in to the Emergency Department and triage  nurse.  Should you have questions after your visit or need to cancel or reschedule your appointment, please contact [facility] [place] [phone]  and follow the prompts.  Office hours are 8:00 a.m. to 4:30 p.m. Monday - Friday. Please note that voicemails left after 4:00 p.m. may not be returned until the following business day.  We are closed weekends and major holidays. You have access to a nurse at all times for urgent questions. Please call the main number to the clinic [phone] and follow the prompts.  For any non-urgent questions, you may also contact your provider using MyChart. We now offer e-Visits for anyone 18 and older to request care online for non-urgent symptoms. For details visit [email or web address].   Also download the MyChart app! Go to the app store, search "MyChart", open the app, select [facility], and log in with your MyChart username and password.   

## 2023-01-03 NOTE — Progress Notes (Signed)
Patient tolerated iron infusion with no complaints voiced.  Peripheral IV site clean and dry with good blood return noted before and after infusion.  Band aid applied.  VSS with discharge and left in satisfactory condition with no s/s of distress noted.   

## 2023-01-08 LAB — AFP TUMOR MARKER: AFP, Serum, Tumor Marker: <1.8 ng/mL (ref 0.0–8.4)

## 2023-01-16 ENCOUNTER — Encounter (HOSPITAL_COMMUNITY): Payer: Self-pay | Admitting: Hematology

## 2023-01-20 ENCOUNTER — Encounter (INDEPENDENT_AMBULATORY_CARE_PROVIDER_SITE_OTHER): Payer: Self-pay | Admitting: Gastroenterology

## 2023-01-20 ENCOUNTER — Ambulatory Visit (INDEPENDENT_AMBULATORY_CARE_PROVIDER_SITE_OTHER): Payer: Medicare HMO | Admitting: Gastroenterology

## 2023-01-20 VITALS — BP 104/67 | HR 83 | Temp 98.1°F | Ht 70.0 in | Wt 149.4 lb

## 2023-01-20 DIAGNOSIS — K769 Liver disease, unspecified: Secondary | ICD-10-CM | POA: Insufficient documentation

## 2023-01-20 DIAGNOSIS — K5900 Constipation, unspecified: Secondary | ICD-10-CM

## 2023-01-20 DIAGNOSIS — D508 Other iron deficiency anemias: Secondary | ICD-10-CM

## 2023-01-20 NOTE — Addendum Note (Signed)
Addended by: Harvel Quale on: 01/20/2023 10:03 PM   Modules accepted: Level of Service

## 2023-01-20 NOTE — Progress Notes (Addendum)
Referring Provider: [facility] Primary Care Physician:  [facility] Primary GI Physician: Jenetta Downer   Chief Complaint  Patient presents with   Anemia    Follow up on anemia and constipation. Reports he does not take oral iron anymore and that he gets iron infusion about once a year. Reports doing well and no concerns today.    HPI:   Jeffery Ponce is a 77 y.o. 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   Patient presenting today for follow up of anemia,constipation and possible advanced liver disease.  History:  noted to have incidental findings of possible liver cirrhosis on previous CT imaging, he had [place] elastography in March 2022 which was also concerning for liver cirrhosis in presence of Caudate lobe enlargement, patient declined liver biopsy at that time and requested to have routine 6 month monitoring with [place], AFP and MELD labs.    Last seen June 2023, at that time  doing well. He has been very active and planted his garden recently. He saw Dr. Raliegh Ip in February and was stopped on IV iron as ferritin was not responding, he had venofer x3. Denied any rectal bleeding, sob or dizziness. Has had some fatigue but this is mild, ongoing. Vitamin D in May was 48.4 he is taking daily vitamin D. maintained on oxycodone, having a BM once daily, sometimes every other day. Taking miralax 2-3x/week with good results. Appetite is good. Weight is stable.   Recommend repeat RUQ [place], repeat MELD labs July 2023, continue to follow with hematology  RUQ [place] 12/25/21  Increased echogenicity throughout the liver consistent with hepatic steatosis AFP 01/06/22 <1.8  Plt count 314k August 2023 Iron studies WNL in August   Present:  Patient reports that he just recently received an iron infusion. He is not currently on iron PO but states he is going to start taking some. States he was feeling  weak prior to iron infusion, feeling some better but still a bit weak. Denies any rectal bleeding or melena. Notes he recently had covid a few months back  and lost weight with this, felt he was also weaker after having this.   Constipation is well managed, taking miralax PRN, having a BM every day. Not needing miralax very often, last took about 6 weeks ago.   Denies jaundice, pruritus, episodes of confusion or ascites.  No red flag symptoms. Patient denies melena, hematochezia, nausea, vomiting, diarrhea, constipation, dysphagia, odyonophagia, early satiety or weight loss.   GCS: 12/2019 Multiple petechial hemorrhages noted involving gastric mucosa possibly the source of patient's chronic GI bleed and iron deficiency anemia. Fresh blood noted on initial images felt to be due to trauma resulting from passage of given capsule across pylorus.  Please note no bleeding noted on prior endoscopy. 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 Last Colonoscopy:11/13/18 A single non-bleeding colonic angioectasia. Treated with argon plasma coagulation (APC). - Diverticulosis in the recto-sigmoid colon, in the sigmoid colon, in the descending colon and in the distal ascending colon. - Six 4 to 7 mm polyps in the rectum and in the distal ascending colon, removed with a cold snare. Biopsies showed polyps in the distal ascending colon were tubular adenomatous, rectal polyps were hyperplastic.  - Internal hemorrhoids. - Tortuous colon.   Colon, polyp(s), ascending - TUBULAR ADENOMA, NEGATIVE FOR HIGH  GRADE DYSPLASIA. 2. Rectum, polyp(s) - HYPERPLASTIC POLYP Last Endoscopy:12/2019  - Normal esophagus.                           - Z-line irregular, 40 cm from the incisors.                           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 Gastric biopsy taken from antral                             mucosa.                           - Normal duodenal bulb and second portion of the                            duodenum.                           - Successful completion of the Video Capsule                            Enteroscope placement.                           - No specimens collected.   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    Past Surgical History:  Procedure Laterality Date   BIOPSY  11/13/2018   Procedure: BIOPSY;  Surgeon: Danie Binder, MD;  [facility];  Service: Endoscopy;;  gastric bx's   BIOPSY  12/22/2019   Procedure: BIOPSY;  Surgeon: Rogene Houston, MD;  [facility];  Service: Endoscopy;;  gastric   CARDIAC CATHETERIZATION     COLONOSCOPY WITH PROPOFOL N/A 11/14/2018   Procedure: COLONOSCOPY WITH PROPOFOL;  Surgeon: Danie Binder, MD;  [facility];  Service: Endoscopy;  Laterality: N/A;   CYSTOSCOPY WITH INJECTION N/A 07/30/2017   Procedure: CYSTOSCOPY WITH INJECTION OF INDOCYANINE GREEN DYE;  Surgeon: Alexis Frock, MD;  [facility]: [facility];  Service: Urology;  Laterality: N/A;   ESOPHAGOGASTRODUODENOSCOPY N/A 12/22/2019   Procedure: ESOPHAGOGASTRODUODENOSCOPY (EGD);  Surgeon: Rogene Houston, MD;  [facility];  Service: Endoscopy;  Laterality: N/A;  1:00   ESOPHAGOGASTRODUODENOSCOPY (EGD) WITH PROPOFOL N/A 11/13/2018   Procedure: ESOPHAGOGASTRODUODENOSCOPY (EGD) WITH PROPOFOL;  Surgeon: Danie Binder, MD;  [facility];  Service: Endoscopy;  Laterality: N/A;   FEMUR FRACTURE SURGERY Right    GIVENS CAPSULE STUDY N/A 10/29/2019   Procedure: GIVENS CAPSULE STUDY;  Surgeon:  Rogene Houston, MD;  [facility];  Service: Endoscopy;  Laterality: N/A;  730am   GIVENS CAPSULE STUDY N/A 12/22/2019  Procedure: GIVENS CAPSULE STUDY;  Surgeon: Rogene Houston, MD;  [facility];  Service: Endoscopy;  Laterality: N/A;   LOWER EXTREMITY ANGIOGRAPHY Right 10/19/2018   Procedure: LOWER EXTREMITY ANGIOGRAPHY;  Surgeon: Algernon Huxley, MD;  Location: [facility];  Service: Cardiovascular;  Laterality: Right;   MCT 3D RECONSTRUCTION ([facility] HX) Left    arm   open reduction and internal fixation leg Right    hip and leg.   POLYPECTOMY  11/14/2018   Procedure: POLYPECTOMY;  Surgeon: Danie Binder, MD;  [facility];  Service: Endoscopy;;   PORT-A-CATH REMOVAL N/A 07/20/2018   Procedure: REMOVAL PORT-A-CATH;  Surgeon: Aviva Signs, MD;  Location: [facility];  Service: General;  Laterality: N/A;   PORTACATH PLACEMENT N/A 12/23/2016   Procedure: PLACEMENT OF TUNNELED CENTRAL VENOUS CATHETER RIGHT INTERNAL JUGULAR WITH SUBCUTANEOUS PORT;  Surgeon: Vickie Epley, MD;  Location: [facility];  Service: Vascular;  Laterality: N/A;   reattatchment of left arm     from [place] 11/19/2016   Procedure: TRANSURETHRAL RESECTION OF BLADDER TUMOR (TURBT) WITH EPIRUBICIN INJECTION;  Surgeon: Franchot Gallo, MD;  Location: [facility];  Service: Urology;  Laterality: N/A;    Current Outpatient Medications  Medication Sig Dispense Refill   albuterol (VENTOLIN HFA) 108 (90 Base) MCG/ACT inhaler 1 puff as needed Inhalation every 4 hrs for 14 days     amLODipine (NORVASC) 5 MG tablet 1 tablet Orally Once a day for 90 days     cholecalciferol (VITAMIN D) 25 MCG (1000 UT) tablet Take 1,000 Units by mouth daily.   4   FARXIGA 5 MG TABS tablet 1 tablet Orally Once a day     GNP ASPIRIN LOW DOSE 81 MG EC tablet TAKE 1 TABLET BY MOUTH ONCE DAILY. 30 tablet 11   hydrochlorothiazide (HYDRODIURIL) 12.5 MG tablet Take 1 tablet  by mouth daily.     lisinopril (ZESTRIL) 2.5 MG tablet 1 tablet Orally Once a day for 90 days     mirtazapine (REMERON) 30 MG tablet Take 1 tablet by mouth at bedtime.     morphine (MSIR) 15 MG tablet 1 tablet as needed Orally twice a day     Oxycodone HCl 10 MG TABS 1 tablet as needed Orally every 4 hours as needed     polyethylene glycol (MIRALAX) packet Take 17 g by mouth daily. 28 each 1   pravastatin (PRAVACHOL) 10 MG tablet Take 1 tablet by mouth every evening.     sodium bicarbonate 650 MG tablet Take 650 mg by mouth 2 (two) times daily.     sodium zirconium cyclosilicate (LOKELMA) 10 g PACK packet Take 1 packet by mouth 3 (three) times daily.     atorvastatin (LIPITOR) 10 MG tablet Take 1 tablet (10 mg total) by mouth daily. 30 tablet 11   No current facility-administered medications for this visit.    Allergies as of 01/20/2023 - Review Complete 01/20/2023  Allergen Reaction Noted   Penicillins Anaphylaxis and Other (See Comments) 12/20/2020   Ciprofloxacin Itching 11/19/2016   Statins Other (See Comments) 12/20/2020   Ambien [zolpidem tartrate] Other (See Comments) 04/28/2017   Oxycodone Nausea Only 04/19/2020   Zolpidem  04/28/2017    Family History  Problem Relation Age of Onset   Aneurysm Mother    Diabetes Father    Colon cancer Brother     Social History   Socioeconomic History   Marital status:  Married    Spouse name: Not on file   Number of children: Not on file   Years of education: Not on file   Highest education level: Not on file  Occupational History   Not on file  Tobacco Use   Smoking status: Every Day    Packs/day: 1.00    Years: 50.00    Total pack years: 50.00    Types: Cigarettes    Passive exposure: Current   Smokeless tobacco: Never  Vaping Use   Vaping Use: Never used  Substance and Sexual Activity   Alcohol use: No   Drug use: No   Sexual activity: Never    Birth control/protection: None    Comment: married-30 years-2 children by  first wife  Other Topics Concern   Not on file  Social History Narrative   Not on file   Social Determinants of Health   Financial Resource Strain: Medium Risk (01/15/2021)   Overall Financial Resource Strain (CARDIA)    Difficulty of Paying Living Expenses: Somewhat hard  Food Insecurity: No Food Insecurity (01/15/2021)   Hunger Vital Sign    Worried About Running Out of Food in the Last Year: Never true    Ran Out of Food in the Last Year: Never true  Transportation Needs: No Transportation Needs (01/15/2021)   PRAPARE - Hydrologist (Medical): No    Lack of Transportation (Non-Medical): No  Physical Activity: Sufficiently Active (01/15/2021)   Exercise Vital Sign    Days of Exercise per Week: 7 days    Minutes of Exercise per Session: 60 min  Stress: Stress Concern Present (01/15/2021)   [place]    Feeling of Stress : To some extent  Social Connections: Moderately Integrated (01/15/2021)   Social Connection and Isolation Panel [NHANES]    Frequency of Communication with Friends and Family: Three times a week    Frequency of Social Gatherings with Friends and Family: Three times a week    Attends Religious Services: More than 4 times per year    Active Member of Clubs or Organizations: No    Attends Archivist Meetings: Never    Marital Status: Married   Review of systems General: negative for malaise, night sweats, fever, chills, weight loss Neck: Negative for lumps, goiter, pain and significant neck swelling Resp: Negative for cough, wheezing, dyspnea at rest CV: Negative for chest pain, leg swelling, palpitations, orthopnea GI: denies melena, hematochezia, nausea, vomiting, diarrhea, constipation, dysphagia, odyonophagia, early satiety or unintentional weight loss.  MSK: Negative for joint pain or swelling, back pain, and muscle pain. Derm: Negative for itching or rash Psych:  Denies depression, anxiety, memory loss, confusion. No homicidal or suicidal ideation.  Heme: Negative for prolonged bleeding, bruising easily, and swollen nodes. Endocrine: Negative for cold or heat intolerance, polyuria, polydipsia and goiter. Neuro: negative for tremor, gait imbalance, syncope and seizures. The remainder of the review of systems is noncontributory.  Physical Exam: BP 104/67 (BP Location: Right Arm, Patient Position: Sitting, Cuff Size: Normal)   Pulse 83   Temp 98.1 F (36.7 C) (Oral)   Ht 5' 10"$  (1.778 m)   Wt 149 lb 6.4 oz (67.8 kg)   BMI 21.44 kg/m  General:   Alert and oriented. No distress noted. Pleasant and cooperative.  Head:  Normocephalic and atraumatic. Eyes:  Conjuctiva clear without scleral icterus. Mouth:  Oral mucosa pink and moist. Good dentition. No lesions. Heart:  Normal rate and rhythm, s1 and s2 heart sounds present.  Lungs: Clear lung sounds in all lobes. Respirations equal and unlabored. Abdomen:  +BS, soft, non-tender and non-distended. No rebound or guarding. No HSM or masses noted. Derm: No palmar erythema or jaundice Msk:  Symmetrical without gross deformities. Normal posture. Extremities:  Without edema. Neurologic:  Alert and  oriented x4 Psych:  Alert and cooperative. Normal mood and affect.  Invalid input(s): "6 MONTHS"   ASSESSMENT: Jeffery Ponce is a 77 y.o. male presenting today for follow up.  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 Has remained well compensated. Last AFP WNL, RUQ [place] also WNL in January. Denies swelling to abdomen, jaundice, pruritus, episodes of confusion. Will update CMP, INR and CBC. Continue with routine 6 months monitoring.  Constipation: well managed with PRN miralax. Can continue with current regimen. Increase water intake, aim for atleast 64 oz per day. Increase fruits, veggies and whole grains, kiwi and  prunes are especially good for constipation.   IDA: hgb 13.6 in August with normal iron studies. Followed by hematology. Previous endoscopic workup mostly unremarkable other than 3 small AVMs without stigmata of bleeding. Denies rectal bleeding or melena. Due for colonoscopy in December 2024, however, patient declines any further colonoscopy for screening and states he would reconsider if had any bleeding or other issues which I feel is reasonable given he is over 60.   PLAN:  INR, CBC, CMP 2. Continue miralax PRN  3. Increase water intake, aim for 64 oz/day, diet high in fruit and veggies  4. Continue to follow with hematology regarding IDA   All questions were answered, patient verbalized understanding and is in agreement with plan as outlined above.   Follow Up: 6 months   Brad Mcgaughy L. Alver Sorrow, MSN, APRN, AGNP-C Adult-Gerontology Nurse Practitioner [facility]  I have reviewed the note and agree with the APP's assessment as described in this progress note  [name], MD Gastroenterology and Hepatology [facility]

## 2023-01-20 NOTE — Patient Instructions (Addendum)
We will update some labs in regards to your liver Continue miralax as needed for constipation  Increase water intake, aim for atleast 64 oz per day Increase fruits, veggies and whole grains, kiwi and prunes are especially good for constipation Continue to follow with Dr. Raliegh Ip regarding for iron deficiency anemia, please do not start any iron pills until you discuss with Dr. Raliegh Ip  It was a pleasure to see you today. I want to create trusting relationships with patients and provide genuine, compassionate, and quality care. I truly value your feedback! please be on the lookout for a survey regarding your visit with me today. I appreciate your input about our visit and your time in completing this!    Toleen Lachapelle L. Alver Sorrow, MSN, APRN, AGNP-C Adult-Gerontology Nurse Practitioner [facility]  Follow up 6 months

## 2023-01-27 ENCOUNTER — Inpatient Hospital Stay: Payer: Medicare HMO | Attending: Hematology

## 2023-01-27 DIAGNOSIS — E611 Iron deficiency: Secondary | ICD-10-CM | POA: Insufficient documentation

## 2023-01-27 DIAGNOSIS — Z87442 Personal history of urinary calculi: Secondary | ICD-10-CM | POA: Diagnosis not present

## 2023-01-27 DIAGNOSIS — R200 Anesthesia of skin: Secondary | ICD-10-CM | POA: Insufficient documentation

## 2023-01-27 DIAGNOSIS — Z8249 Family history of ischemic heart disease and other diseases of the circulatory system: Secondary | ICD-10-CM | POA: Insufficient documentation

## 2023-01-27 DIAGNOSIS — D500 Iron deficiency anemia secondary to blood loss (chronic): Secondary | ICD-10-CM

## 2023-01-27 DIAGNOSIS — R42 Dizziness and giddiness: Secondary | ICD-10-CM | POA: Diagnosis not present

## 2023-01-27 DIAGNOSIS — R109 Unspecified abdominal pain: Secondary | ICD-10-CM | POA: Diagnosis not present

## 2023-01-27 DIAGNOSIS — Z833 Family history of diabetes mellitus: Secondary | ICD-10-CM | POA: Insufficient documentation

## 2023-01-27 DIAGNOSIS — Z8551 Personal history of malignant neoplasm of bladder: Secondary | ICD-10-CM | POA: Insufficient documentation

## 2023-01-27 DIAGNOSIS — Z800 Family history of malignant neoplasm of digestive organs: Secondary | ICD-10-CM | POA: Insufficient documentation

## 2023-01-27 DIAGNOSIS — D631 Anemia in chronic kidney disease: Secondary | ICD-10-CM | POA: Diagnosis present

## 2023-01-27 DIAGNOSIS — N1832 Chronic kidney disease, stage 3b: Secondary | ICD-10-CM | POA: Insufficient documentation

## 2023-01-27 DIAGNOSIS — F17210 Nicotine dependence, cigarettes, uncomplicated: Secondary | ICD-10-CM | POA: Diagnosis not present

## 2023-01-27 DIAGNOSIS — Z79899 Other long term (current) drug therapy: Secondary | ICD-10-CM | POA: Diagnosis not present

## 2023-01-27 LAB — CBC WITH DIFFERENTIAL/PLATELET
Abs Immature Granulocytes: 0.06 10*3/uL (ref 0.00–0.07)
Basophils Absolute: 0.1 10*3/uL (ref 0.0–0.1)
Basophils Relative: 0 %
Eosinophils Absolute: 0.1 10*3/uL (ref 0.0–0.5)
Eosinophils Relative: 1 %
HCT: 34.2 % — ABNORMAL LOW (ref 39.0–52.0)
Hemoglobin: 10.4 g/dL — ABNORMAL LOW (ref 13.0–17.0)
Immature Granulocytes: 1 %
Lymphocytes Relative: 15 %
Lymphs Abs: 1.7 10*3/uL (ref 0.7–4.0)
MCH: 28.4 pg (ref 26.0–34.0)
MCHC: 30.4 g/dL (ref 30.0–36.0)
MCV: 93.4 fL (ref 80.0–100.0)
Monocytes Absolute: 0.7 10*3/uL (ref 0.1–1.0)
Monocytes Relative: 6 %
Neutro Abs: 8.8 10*3/uL — ABNORMAL HIGH (ref 1.7–7.7)
Neutrophils Relative %: 77 %
Platelets: 345 10*3/uL (ref 150–400)
RBC: 3.66 MIL/uL — ABNORMAL LOW (ref 4.22–5.81)
RDW: 15 % (ref 11.5–15.5)
WBC: 11.4 10*3/uL — ABNORMAL HIGH (ref 4.0–10.5)
nRBC: 0 % (ref 0.0–0.2)

## 2023-01-27 LAB — IRON AND TIBC
Iron: 46 ug/dL (ref 45–182)
Saturation Ratios: 17 % — ABNORMAL LOW (ref 17.9–39.5)
TIBC: 270 ug/dL (ref 250–450)
UIBC: 224 ug/dL

## 2023-01-27 LAB — FERRITIN: Ferritin: 104 ng/mL (ref 24–336)

## 2023-02-03 ENCOUNTER — Inpatient Hospital Stay: Payer: Medicare HMO | Admitting: Hematology

## 2023-02-06 ENCOUNTER — Inpatient Hospital Stay (HOSPITAL_BASED_OUTPATIENT_CLINIC_OR_DEPARTMENT_OTHER): Payer: Medicare HMO | Admitting: Hematology

## 2023-02-06 DIAGNOSIS — D500 Iron deficiency anemia secondary to blood loss (chronic): Secondary | ICD-10-CM | POA: Diagnosis not present

## 2023-02-06 DIAGNOSIS — N1832 Chronic kidney disease, stage 3b: Secondary | ICD-10-CM | POA: Diagnosis not present

## 2023-02-06 NOTE — Patient Instructions (Addendum)
[facility] Discharge Instructions   You were seen and examined today by Dr. Delton Coombes.  He reviewed the results of your lab work. Your hemoglobin has dropped slightly since your last visit. Even though your iron level is normal, because of your kidney disease, you require more iron in order for your body to make blood.   We will arrange for you to have two IV iron infusions to help build your blood back up.   We will see you back in 3 months. We will repeat your lab work prior to this visit.    Thank you for choosing [facility] to provide your oncology and hematology care.  To afford each patient quality time with our provider, please arrive at least 15 minutes before your scheduled appointment time.   If you have a lab appointment with the [facility] please come in thru the Main Entrance and check in at the main information desk.  You need to re-schedule your appointment should you arrive 10 or more minutes late.  We strive to give you quality time with our providers, and arriving late affects you and other patients whose appointments are after yours.  Also, if you no show three or more times for appointments you may be dismissed from the clinic at the providers discretion.     Again, thank you for choosing [facility].  Our hope is that these requests will decrease the amount of time that you wait before being seen by our physicians.       _____________________________________________________________  Should you have questions after your visit to [facility], please contact our office at [phone] and follow the prompts.  Our office hours are 8:00 a.m. and 4:30 p.m. Monday - Friday.  Please note that voicemails left after 4:00 p.m. may not be returned until the following business day.  We are closed weekends and major holidays.  You do have access to a nurse 24-7, just call the main  number to the clinic [phone] and do not press any options, hold on the line and a nurse will answer the phone.    For prescription refill requests, have your pharmacy contact our office and allow 72 hours.    Due to Covid, you will need to wear a mask upon entering the hospital. If you do not have a mask, a mask will be given to you at the Main Entrance upon arrival. For doctor visits, patients may have 1 support person age 5 or older with them. For treatment visits, patients can not have anyone with them due to social distancing guidelines and our immunocompromised population.

## 2023-02-06 NOTE — Progress Notes (Signed)
[facility] [address] [place] [address]    Clinic Day:  02/06/2023  Referring physician: [facility]  Patient Care Team: [facility] as PCP - General Alexis Frock, MD as Consulting Physician (Urology) Fran Lowes, MD (Inactive) as Consulting Physician (Nephrology)   ASSESSMENT & PLAN:   Assessment: 1.  Normocytic anemia: -Combination anemia from CKD and IDA. -Receives intermittent parenteral iron therapy.  Last Venofer was on 01/05/2020. -EGD on 12/22/2019 showed normal esophagus, multiple petechial hemorrhages noted at gastric body and antrum.  Normal duodenal bulb and second part of duodenum. -Capsule study on 12/22/2019 showed multiple petechial hemorrhages involving gastric mucosa, fresh blood noted on images likely from trauma as the capsule forced across the pylorus.  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  Dark black liquid noted in the distal small bowel likely due to iron.   2.  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 -As there was suspicion for muscle invasion, underwent cisplatin and gemcitabine 4 cycles from 12/30/2016 through 04/21/2017 followed by cystoprostatectomy and ileal conduit on 07/30/2017, showing residual disease of 0.4 cm. -CT CAP on 06/20/2020 with no findings of recurrence or metastatic disease.  Hepatic morphology with mild cirrhosis cannot be excluded.  Similar bilateral pulmonary nodules, favoring benign etiology.  Slight enlargement of mediastinal node which is not pathologic considered reactive.  Plan: 1.  Normocytic anemia: - Last Venofer was on 01/03/2023.  He denies any blood in the urine bag.  Denies any bleeding per rectum or melena. - Reviewed labs today which showed ferritin is 104 but his hemoglobin dropped to 10.4 from 13.6. - I have recommended Venofer 500 mg x 2.  RTC 3 months for follow-up with repeat labs including ferritin and iron panel.   2.  Bladder  cancer: - CTAP on 07/13/2021: Right lower quadrant ileal conduit with no evidence of recurrence.  3.  CKD: - Continue follow-up with Dr. Posey Pronto.  Orders Placed This Encounter  Procedures   CBC with Differential    Standing Status:   Future    Standing Expiration Date:   02/06/2024   Comprehensive metabolic panel    Standing Status:   Future    Standing Expiration Date:   02/06/2024   Ferritin    Standing Status:   Future    Standing Expiration Date:   02/06/2024   Iron and TIBC ([facility] ONLY)    Standing Status:   Future    Standing Expiration Date:   02/06/2024      Kaleen Odea as a scribe for Derek Jack, MD.,have documented all relevant documentation on the behalf of Derek Jack, MD,as directed by  Derek Jack, MD while in the presence of Derek Jack, MD.   I, Derek Jack MD, have reviewed the above documentation for accuracy and completeness, and I agree with the above.   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 Cisplatin and gemcitabine  x 4 cycles from 12/30/2016 to 04/21/2017. 2. Cystoprostatectomy with ileal conduit on 07/30/2017.    Current Therapy:   Surveillance; intermittent Venofer last on 01/05/2020    HISTORY OF PRESENT ILLNESS:   Oncology History  Malignant papillary carcinoma of bladder ([place])  11/15/2016 Imaging   CT abd/pelvis- Enhancing soft tissue lesion along the  left bladder base, measuring approximately 3.2 x 2.7 cm, worrisome for primary bladder neoplasm. Correlate with tissue sampling on cystoscopy.   No findings suspicious for upper tract disease.   Cholelithiasis, without associated inflammatory changes.   11/19/2016 Procedure   TURBT by [name] of a 2 centimeter anterior bladder wall tumor, placement of epirubicin intravesical   11/19/2016 Procedure   50 milligrams of epirubicin and 50 mL of diluent.  This was left indwelling for 1 hour by [name]   11/22/2016 Pathology Results   Bladder, transurethral resection, bladder tumor INFILTRATING HIGH GRADE UROTHELIAL CARCINOMA THE CARCINOMA INVADES MUSCULARIS PROPRIA (DETRUSOR MUSCLE) Microscopic Comment The neoplasm stains positive for high molecular weight cytokeratin, p63 and negative for prostein. The immunostain pattern supports the diagnosis of urothelial carcinoma.   12/18/2016 Imaging   Bilateral interstitial prominence. Active pneumonitis cannot be excluded. These changes could be also be related chronic interstitial lung disease.   12/30/2016 - 04/28/2017 Chemotherapy   The patient had palonosetron (ALOXI) injection 0.25 mg, 0.25 mg, Intravenous,  Once, 4 of 4 cycles  CISplatin (PLATINOL) 138 mg in sodium chloride 0.9 % 500 mL chemo infusion, 70 mg/m2 = 138 mg, Intravenous,  Once, 4 of 4 cycles  gemcitabine (GEMZAR) 1,976 mg in sodium chloride 0.9 % 250 mL chemo infusion, 1,000 mg/m2 = 1,976 mg, Intravenous,  Once, 4 of 4 cycles Dose modification: 800 mg/m2 (80 % of original dose 1,000 mg/m2, Cycle 3, Reason: Provider Judgment, Comment: Thrombocytopenia)  fosaprepitant (EMEND) 150 mg, dexamethasone (DECADRON) 12 mg in sodium chloride 0.9 % 145 mL IVPB, , Intravenous,  Once, 4 of 4 cycles  for chemotherapy treatment.     02/17/2017 Treatment Plan Change   Treatment deferred x 7 days due to thrombocytopenia   02/17/2017 Treatment Plan Change   Gemcitabine dose  reduced by 20% due to thrombocytopenia   03/10/2017 Treatment Plan Change   Cisplatin dose-reduced by 20%   04/28/2017 Treatment Plan Change   Day 15 of cycle #4 is cancelled due to thrombocytopenia (68,000).   05/02/2017 Imaging   CT abd/pelvis at [facility]-no evidence of metastatic disease or recurrent focal urothelial lesion on noncontrast imaging.  There is mild diffuse bladder wall thickening.  Urothelial evaluation limited without contrast.  Cholelithiasis, sigmoid diverticulosis, aortic atherosclerosis.   07/30/2017 Definitive Surgery   Cystoprostatectomy by [name] with curative intent.   07/30/2017 Pathology Results   Specimen: Bladder and prostate Procedure: Cystoprostatectomy Tumor site (if known): anterior wall Maximum tumor size (cm): 0.4 cm Histology: urothelial carcinoma Grade: high grade Microscopic tumor extension: deeper muscularis propria Lymph - Vascular invasion: Negative Involvement of adjacent organs/structures: Negative Additional epithelial lesions: Carcinoma in situ Margins: negative Lymph nodes: number examined 15 ; number positive 0 TNM code: ypT2b, ypN 0,   07/30/2017 Cancer Staging   Cancer Staging Urothelial carcinoma of bladder ([place]) Staging form: Urinary Bladder, AJCC 8th Edition - Clinical stage from 12/05/2016: Stage IIIA (cT3, cN0, cM0) - Signed by [name], [name] on 12/18/2016 - Pathologic stage from 12/10/2016: pT2, pN0, cM0 - Signed by [name], [name] on 12/10/2016 - Pathologic stage from 07/30/2017: Stage II (ypT2b, pN0, cM0) -  Signed by [name], NP on 09/09/2017    11/26/2017 Imaging   CT abd./pelvis- 1. Several mildly enlarged lymph nodes are present including a 1.2 cm peripancreatic lymph node and a 1.4 cm low-density left external iliac lymph node or postoperative fluid collection. These merit surveillance. 2. A right middle lobe subpleural nodule along the minor fissure as a volume of 100 cubic mm. This  level has not been included in prior cross-sectional imaging to assess for stability. Guidelines for follow up do not specifically apply due to history of malignancy. Well likely a benign subpleural lymph node, surveillance is likely warranted. 3. Other imaging findings of potential clinical significance: Aortic Atherosclerosis (ICD10-I70.0) and Emphysema (ICD10-J43.9). Coronary atherosclerosis with mitral and aortic valve calcification. Borderline appearance for distal esophageal wall thickening, query reflux. Several small left lower lobe nodules in the 3-4 mm range are probably inflammatory. Cholelithiasis. Cystoprostatectomy with loop ileostomy. Sigmoid diverticulosis. Notable common and external iliac stenosis bilaterally due to atherosclerosis. Lumbar impingement at L2- 3, L3-4, and L4-5.      INTERVAL HISTORY:   Jeffery Ponce is a 77 y.o. male presenting to clinic today for follow up of bladder cancer & normocytic anemia . He was last seen by me on 08/01/2023.  Today, he states that he is doing well overall. His appetite level is at 60%. His energy level is at 60%. He admits to feeling week. He states that after being put on kidney medication he had black/dark urine. He also urinates in a bag. He admits to doing fine with the iron. He occasionally feel lightheaded.    PAST MEDICAL HISTORY:   Past Medical History: Past Medical History:  Diagnosis Date   Arthritis    Chronic renal disease, stage 3, moderately decreased glomerular filtration rate (GFR) between 30-59 mL/min/1.73 square meter ([place]) 11/28/2017   Heart murmur    History of kidney stones    Hypercholesteremia    Urothelial carcinoma ([place]) 12/10/2016   Urothelial carcinoma of bladder ([place]) 12/10/2016    Surgical History: Past Surgical History:  Procedure Laterality Date   BIOPSY  11/13/2018   Procedure: BIOPSY;  Surgeon: Danie Binder, MD;  [facility];  Service: Endoscopy;;  gastric bx's   BIOPSY  12/22/2019    Procedure: BIOPSY;  Surgeon: Rogene Houston, MD;  [facility];  Service: Endoscopy;;  gastric   CARDIAC CATHETERIZATION     COLONOSCOPY WITH PROPOFOL N/A 11/14/2018   Procedure: COLONOSCOPY WITH PROPOFOL;  Surgeon: Danie Binder, MD;  [facility];  Service: Endoscopy;  Laterality: N/A;   CYSTOSCOPY WITH INJECTION N/A 07/30/2017   Procedure: CYSTOSCOPY WITH INJECTION OF INDOCYANINE GREEN DYE;  Surgeon: Alexis Frock, MD;  Location: [facility];  Service: Urology;  Laterality: N/A;   ESOPHAGOGASTRODUODENOSCOPY N/A 12/22/2019   Procedure: ESOPHAGOGASTRODUODENOSCOPY (EGD);  Surgeon: Rogene Houston, MD;  [facility];  Service: Endoscopy;  Laterality: N/A;  1:00   ESOPHAGOGASTRODUODENOSCOPY (EGD) WITH PROPOFOL N/A 11/13/2018   Procedure: ESOPHAGOGASTRODUODENOSCOPY (EGD) WITH PROPOFOL;  Surgeon: Danie Binder, MD;  [facility];  Service: Endoscopy;  Laterality: N/A;   FEMUR FRACTURE SURGERY Right    GIVENS CAPSULE STUDY N/A 10/29/2019   Procedure: GIVENS CAPSULE STUDY;  Surgeon: Rogene Houston, MD;  [facility];  Service: Endoscopy;  Laterality: N/A;  730am   GIVENS CAPSULE STUDY N/A 12/22/2019   Procedure: GIVENS CAPSULE STUDY;  Surgeon: Rogene Houston, MD;  [facility];  Service:  Endoscopy;  Laterality: N/A;   LOWER EXTREMITY ANGIOGRAPHY Right 10/19/2018   Procedure: LOWER EXTREMITY ANGIOGRAPHY;  Surgeon: Algernon Huxley, MD;  [facility];  Service: Cardiovascular;  Laterality: Right;   MCT 3D RECONSTRUCTION ([facility] HX) Left    arm   open reduction and internal fixation leg Right    hip and leg.   POLYPECTOMY  11/14/2018   Procedure: POLYPECTOMY;  Surgeon: Danie Binder, MD;  [facility];  Service: Endoscopy;;   PORT-A-CATH REMOVAL N/A 07/20/2018   Procedure: REMOVAL PORT-A-CATH;  Surgeon: Aviva Signs, MD;  Location: [facility];  Service: General;  Laterality: N/A;   PORTACATH PLACEMENT N/A  12/23/2016   Procedure: PLACEMENT OF TUNNELED CENTRAL VENOUS CATHETER RIGHT INTERNAL JUGULAR WITH SUBCUTANEOUS PORT;  Surgeon: Vickie Epley, MD;  Location: [facility];  Service: Vascular;  Laterality: N/A;   reattatchment of left arm     from [place] 11/19/2016   Procedure: TRANSURETHRAL RESECTION OF BLADDER TUMOR (TURBT) WITH EPIRUBICIN INJECTION;  Surgeon: Franchot Gallo, MD;  Location: [facility];  Service: Urology;  Laterality: N/A;    Social History: Social History   Socioeconomic History   Marital status: Married    Spouse name: Not on file   Number of children: Not on file   Years of education: Not on file   Highest education level: Not on file  Occupational History   Not on file  Tobacco Use   Smoking status: Every Day    Packs/day: 1.00    Years: 50.00    Total pack years: 50.00    Types: Cigarettes    Passive exposure: Current   Smokeless tobacco: Never  Vaping Use   Vaping Use: Never used  Substance and Sexual Activity   Alcohol use: No   Drug use: No   Sexual activity: Never    Birth control/protection: None    Comment: married-30 years-2 children by first wife  Other Topics Concern   Not on file  Social History Narrative   Not on file   Social Determinants of Health   Financial Resource Strain: Medium Risk (01/15/2021)   Overall Financial Resource Strain (CARDIA)    Difficulty of Paying Living Expenses: Somewhat hard  Food Insecurity: No Food Insecurity (01/15/2021)   Hunger Vital Sign    Worried About Running Out of Food in the Last Year: Never true    Ran Out of Food in the Last Year: Never true  Transportation Needs: No Transportation Needs (01/15/2021)   PRAPARE - Hydrologist (Medical): No    Lack of Transportation (Non-Medical): No  Physical Activity: Sufficiently Active (01/15/2021)   Exercise Vital Sign    Days of Exercise per Week: 7 days    Minutes of Exercise per Session:  60 min  Stress: Stress Concern Present (01/15/2021)   [place]    Feeling of Stress : To some extent  Social Connections: Moderately Integrated (01/15/2021)   Social Connection and Isolation Panel [NHANES]    Frequency of Communication with Friends and Family: Three times a week    Frequency of Social Gatherings with Friends and Family: Three times a week    Attends Religious Services: More than 4 times per year    Active Member of Clubs or Organizations: No    Attends Archivist Meetings: Never    Marital Status: Married  Human resources officer  Violence: Not At Risk (01/15/2021)   Humiliation, Afraid, Rape, and Kick questionnaire    Fear of Current or Ex-Partner: No    Emotionally Abused: No    Physically Abused: No    Sexually Abused: No    Family History: Family History  Problem Relation Age of Onset   Aneurysm Mother    Diabetes Father    Colon cancer Brother     Current Medications:  Current Outpatient Medications:    albuterol (VENTOLIN HFA) 108 (90 Base) MCG/ACT inhaler, 1 puff as needed Inhalation every 4 hrs for 14 days, Disp: , Rfl:    amLODipine (NORVASC) 5 MG tablet, 1 tablet Orally Once a day for 90 days, Disp: , Rfl:    CHLORTHALIDONE PO, Take 12.5 mg by mouth daily., Disp: , Rfl:    cholecalciferol (VITAMIN D) 25 MCG (1000 UT) tablet, Take 1,000 Units by mouth daily. , Disp: , Rfl: 4   FARXIGA 5 MG TABS tablet, 1 tablet Orally Once a day, Disp: , Rfl:    GNP ASPIRIN LOW DOSE 81 MG EC tablet, TAKE 1 TABLET BY MOUTH ONCE DAILY., Disp: 30 tablet, Rfl: 11   hydrochlorothiazide (HYDRODIURIL) 12.5 MG tablet, Take 1 tablet by mouth daily., Disp: , Rfl:    lisinopril (ZESTRIL) 2.5 MG tablet, 1 tablet Orally Once a day for 90 days, Disp: , Rfl:    mirtazapine (REMERON) 30 MG tablet, Take 1 tablet by mouth at bedtime., Disp: , Rfl:    morphine (MSIR) 15 MG tablet, 1 tablet as needed Orally twice a day,  Disp: , Rfl:    Oxycodone HCl 10 MG TABS, 1 tablet as needed Orally every 4 hours as needed, Disp: , Rfl:    polyethylene glycol (MIRALAX) packet, Take 17 g by mouth daily., Disp: 28 each, Rfl: 1   pravastatin (PRAVACHOL) 10 MG tablet, Take 1 tablet by mouth every evening., Disp: , Rfl:    sodium bicarbonate 650 MG tablet, Take 650 mg by mouth 2 (two) times daily., Disp: , Rfl:    sodium zirconium cyclosilicate (LOKELMA) 10 g PACK packet, Take 1 packet by mouth 3 (three) times daily., Disp: , Rfl:    atorvastatin (LIPITOR) 10 MG tablet, Take 1 tablet (10 mg total) by mouth daily., Disp: 30 tablet, Rfl: 11   Allergies: Allergies  Allergen Reactions   Penicillins Anaphylaxis and Other (See Comments)    Has patient had a PCN reaction causing immediate rash, facial/tongue/throat swelling, SOB or lightheadedness with hypotension:Yes Has patient had a PCN reaction causing severe rash involving mucus membranes or skin necrosis:Yes Has patient had a PCN reaction that required hospitalization:No Has patient had a PCN reaction occurring within the last 10 years:No If all of the above answers are "NO", then may proceed with Cephalosporin use.    Ciprofloxacin Itching    Itching at IV site. No hives or shortness of breathe.   Statins Other (See Comments)   Ambien [Zolpidem Tartrate] Other (See Comments)    "Sleep walking"   Oxycodone Nausea Only   Zolpidem     Other reaction(s): Other (see comments) "Sleep walking"    REVIEW OF SYSTEMS:   Review of Systems  Constitutional:  Negative for chills, fatigue and fever.  HENT:   Negative for lump/mass, mouth sores, nosebleeds, sore throat and trouble swallowing.   Eyes:  Negative for eye problems.  Respiratory:  Negative for cough and shortness of breath.   Cardiovascular:  Negative for chest pain, leg swelling and palpitations.  Gastrointestinal:  Positive for abdominal pain. Negative for constipation, diarrhea, nausea and vomiting.   Genitourinary:  Negative for bladder incontinence, difficulty urinating, dysuria, frequency, hematuria and nocturia.   Musculoskeletal:  Negative for arthralgias, back pain, flank pain, myalgias and neck pain.  Skin:  Negative for itching and rash.  Neurological:  Positive for dizziness and numbness (Legs). Negative for headaches.  Hematological:  Does not bruise/bleed easily.  Psychiatric/Behavioral:  Negative for depression, sleep disturbance and suicidal ideas. The patient is not nervous/anxious.   All other systems reviewed and are negative.    VITALS:   There were no vitals taken for this visit.  Wt Readings from Last 3 Encounters:  01/20/23 149 lb 6.4 oz (67.8 kg)  07/31/22 160 lb 1.6 oz (72.6 kg)  06/04/22 159 lb 12.8 oz (72.5 kg)    There is no height or weight on file to calculate BMI.  Performance status (ECOG): 1 - Symptomatic but completely ambulatory  PHYSICAL EXAM:   Physical Exam Vitals reviewed.  Constitutional:      Appearance: Normal appearance.  Cardiovascular:     Rate and Rhythm: Normal rate and regular rhythm.     Heart sounds: Normal heart sounds.  Pulmonary:     Effort: Pulmonary effort is normal.     Breath sounds: Normal breath sounds.  Neurological:     Mental Status: He is alert.  Psychiatric:        Mood and Affect: Mood normal.        Behavior: Behavior normal.     LABS:      Latest Ref Rng & Units 01/27/2023    1:02 PM 07/24/2022   12:52 PM 01/17/2022    1:22 PM  CBC  WBC 4.0 - 10.5 K/uL 11.4  10.4  12.4   Hemoglobin 13.0 - 17.0 g/dL 10.4  13.6  12.8   Hematocrit 39.0 - 52.0 % 34.2  42.3  40.1   Platelets 150 - 400 K/uL 345  314  299       Latest Ref Rng & Units 01/17/2022    1:22 PM 12/19/2021    9:18 AM 07/12/2021   12:08 PM  CMP  Glucose 70 - 99 mg/dL 115  95  91   BUN 8 - 23 mg/dL 53  21  22   Creatinine 0.61 - 1.24 mg/dL 3.06  1.71  1.93   Sodium 135 - 145 mmol/L 133  139  139   Potassium 3.5 - 5.1 mmol/L 4.8  5.2  4.4    Chloride 98 - 111 mmol/L 106  101  105   CO2 22 - 32 mmol/L '18  21  26   '$ Calcium 8.9 - 10.3 mg/dL 8.8  9.4  9.4   Total Protein 6.5 - 8.1 g/dL 7.3  7.2  7.7   Total Bilirubin 0.3 - 1.2 mg/dL 0.4  0.3  0.5   Alkaline Phos 38 - 126 U/L 103  138  99   AST 15 - 41 U/L '15  13  13   '$ ALT 0 - 44 U/L '14  9  8      '$ No results found for: "CEA1", "CEA" / No results found for: "CEA1", "CEA" No results found for: "PSA1" No results found for: "[id number]" No results found for: "CAN125"  No results found for: "TOTALPROTELP", "ALBUMINELP", "A1GS", "A2GS", "BETS", "BETA2SER", "GAMS", "MSPIKE", "SPEI" Lab Results  Component Value Date   TIBC 270 01/27/2023   TIBC 294 07/24/2022   TIBC 331 01/17/2022   FERRITIN  104 01/27/2023   FERRITIN 108 07/24/2022   FERRITIN 56 01/17/2022   IRONPCTSAT 17 (L) 01/27/2023   IRONPCTSAT 18 07/24/2022   IRONPCTSAT 24 01/17/2022   Lab Results  Component Value Date   LDH 119 10/20/2018   LDH 136 04/20/2018     STUDIES:   No results found.

## 2023-02-10 ENCOUNTER — Inpatient Hospital Stay: Payer: Medicare HMO | Attending: Hematology

## 2023-02-10 VITALS — BP 97/61 | HR 65 | Temp 96.3°F | Resp 18

## 2023-02-10 DIAGNOSIS — N1832 Chronic kidney disease, stage 3b: Secondary | ICD-10-CM | POA: Insufficient documentation

## 2023-02-10 DIAGNOSIS — Z79899 Other long term (current) drug therapy: Secondary | ICD-10-CM | POA: Insufficient documentation

## 2023-02-10 DIAGNOSIS — D649 Anemia, unspecified: Secondary | ICD-10-CM

## 2023-02-10 DIAGNOSIS — Z8551 Personal history of malignant neoplasm of bladder: Secondary | ICD-10-CM | POA: Insufficient documentation

## 2023-02-10 DIAGNOSIS — D500 Iron deficiency anemia secondary to blood loss (chronic): Secondary | ICD-10-CM

## 2023-02-10 DIAGNOSIS — D631 Anemia in chronic kidney disease: Secondary | ICD-10-CM | POA: Insufficient documentation

## 2023-02-10 DIAGNOSIS — E611 Iron deficiency: Secondary | ICD-10-CM | POA: Diagnosis not present

## 2023-02-10 MED ORDER — ACETAMINOPHEN 325 MG PO TABS
650.0000 mg | ORAL_TABLET | Freq: Once | ORAL | Status: AC
  Administered 2023-02-10: 650 mg via ORAL
  Filled 2023-02-10: qty 2

## 2023-02-10 MED ORDER — SODIUM CHLORIDE 0.9 % IV SOLN: 500.0000 mg | Freq: Once | INTRAVENOUS | Status: DC

## 2023-02-10 MED ORDER — SODIUM CHLORIDE 0.9 % IV SOLN
500.0000 mg | Freq: Once | INTRAVENOUS | Status: AC
  Administered 2023-02-10: 500 mg via INTRAVENOUS
  Filled 2023-02-10: qty 25

## 2023-02-10 MED ORDER — LORATADINE 10 MG PO TABS: 10.0000 mg | ORAL_TABLET | Freq: Once | ORAL | Status: DC

## 2023-02-10 MED ORDER — SODIUM CHLORIDE 0.9 % IV SOLN: INTRAVENOUS | Status: DC

## 2023-02-10 MED ORDER — FAMOTIDINE 20 MG PO TABS
20.0000 mg | ORAL_TABLET | Freq: Once | ORAL | Status: AC
  Administered 2023-02-10: 20 mg via ORAL
  Filled 2023-02-10: qty 1

## 2023-02-10 MED ORDER — CETIRIZINE HCL 10 MG PO TABS
10.0000 mg | ORAL_TABLET | Freq: Once | ORAL | Status: AC
  Administered 2023-02-10: 10 mg via ORAL
  Filled 2023-02-10: qty 1

## 2023-02-10 NOTE — Progress Notes (Signed)
Patient got Venofer 500 mg today.  Patient called the office back after returning home and complained of "black colored urine".  Nurse spoke with Dr. Delton Coombes. He was unsure of this and did not believe it was from the iron.  Patient was advised to push fluids and report to the ED if symptoms persisted or worsened.

## 2023-02-10 NOTE — Patient Instructions (Signed)
[facility]  Discharge Instructions: Thank you for choosing [facility] to provide your oncology and hematology care.  If you have a lab appointment with the [facility], please come in thru the Main Entrance and check in at the main information desk.  Wear comfortable clothing and clothing appropriate for easy access to any Portacath or PICC line.   We strive to give you quality time with your provider. You may need to reschedule your appointment if you arrive late (15 or more minutes).  Arriving late affects you and other patients whose appointments are after yours.  Also, if you miss three or more appointments without notifying the office, you may be dismissed from the clinic at the provider's discretion.      For prescription refill requests, have your pharmacy contact our office and allow 72 hours for refills to be completed.    Iron Sucrose Injection What is this medication? IRON SUCROSE (EYE ern SOO krose) treats low levels of iron (iron deficiency anemia) in people with kidney disease. Iron is a mineral that plays an important role in making red blood cells, which carry oxygen from your lungs to the rest of your body. This medicine may be used for other purposes; ask your health care provider or pharmacist if you have questions. COMMON BRAND NAME(S): Venofer What should I tell my care team before I take this medication? They need to know if you have any of these conditions: Anemia not caused by low iron levels Heart disease High levels of iron in the blood Kidney disease Liver disease An unusual or allergic reaction to iron, other medications, foods, dyes, or preservatives Pregnant or trying to get pregnant Breastfeeding How should I use this medication? This medication is for infusion into a vein. It is given in a hospital or clinic setting. Talk to your care team about the use of this medication in children. While this medication may be  prescribed for children as young as 2 years for selected conditions, precautions do apply. Overdosage: If you think you have taken too much of this medicine contact a poison control center or emergency room at once. NOTE: This medicine is only for you. Do not share this medicine with others. What if I miss a dose? Keep appointments for follow-up doses. It is important not to miss your dose. Call your care team if you are unable to keep an appointment. What may interact with this medication? Do not take this medication with any of the following: Deferoxamine Dimercaprol Other iron products This medication may also interact with the following: Chloramphenicol Deferasirox This list may not describe all possible interactions. Give your health care provider a list of all the medicines, herbs, non-prescription drugs, or dietary supplements you use. Also tell them if you smoke, drink alcohol, or use illegal drugs. Some items may interact with your medicine. What should I watch for while using this medication? Visit your care team regularly. Tell your care team if your symptoms do not start to get better or if they get worse. You may need blood work done while you are taking this medication. You may need to follow a special diet. Talk to your care team. Foods that contain iron include: whole grains/cereals, dried fruits, beans, or peas, leafy green vegetables, and organ meats (liver, kidney). What side effects may I notice from receiving this medication? Side effects that you should report to your care team as soon as possible: Allergic reactions--skin rash, itching, hives,  swelling of the face, lips, tongue, or throat Low blood pressure--dizziness, feeling faint or lightheaded, blurry vision Shortness of breath Side effects that usually do not require medical attention (report to your care team if they continue or are bothersome): Flushing Headache Joint pain Muscle pain Nausea Pain, redness, or  irritation at injection site This list may not describe all possible side effects. Call your doctor for medical advice about side effects. You may report side effects to FDA at 1-800-FDA-1088. Where should I keep my medication? This medication is given in a hospital or clinic and will not be stored at home. NOTE: This sheet is a summary. It may not cover all possible information. If you have questions about this medicine, talk to your doctor, pharmacist, or health care provider.  2023 Elsevier/Gold Standard (2021-03-08 00:00:00)    To help prevent nausea and vomiting after your treatment, we encourage you to take your nausea medication as directed.  BELOW ARE SYMPTOMS THAT SHOULD BE REPORTED IMMEDIATELY: *FEVER GREATER THAN 100.4 F (38 C) OR HIGHER *CHILLS OR SWEATING *NAUSEA AND VOMITING THAT IS NOT CONTROLLED WITH YOUR NAUSEA MEDICATION *UNUSUAL SHORTNESS OF BREATH *UNUSUAL BRUISING OR BLEEDING *URINARY PROBLEMS (pain or burning when urinating, or frequent urination) *BOWEL PROBLEMS (unusual diarrhea, constipation, pain near the anus) TENDERNESS IN MOUTH AND THROAT WITH OR WITHOUT PRESENCE OF ULCERS (sore throat, sores in mouth, or a toothache) UNUSUAL RASH, SWELLING OR PAIN  UNUSUAL VAGINAL DISCHARGE OR ITCHING   Items with * indicate a potential emergency and should be followed up as soon as possible or go to the Emergency Department if any problems should occur.  Please show the CHEMOTHERAPY ALERT CARD or IMMUNOTHERAPY ALERT CARD at check-in to the Emergency Department and triage nurse.  Should you have questions after your visit or need to cancel or reschedule your appointment, please contact [facility] [phone]  and follow the prompts.  Office hours are 8:00 a.m. to 4:30 p.m. Monday - Friday. Please note that voicemails left after 4:00 p.m. may not be returned until the following business day.  We are closed weekends and major holidays. You have access to  a nurse at all times for urgent questions. Please call the main number to the clinic [phone] and follow the prompts.  For any non-urgent questions, you may also contact your provider using MyChart. We now offer e-Visits for anyone 42 and older to request care online for non-urgent symptoms. For details visit [email or web address].   Also download the MyChart app! Go to the app store, search "MyChart", open the app, select [facility], and log in with your MyChart username and password.

## 2023-02-10 NOTE — Progress Notes (Signed)
Patient presents today for Venofer 500 mg.  Patient is in satisfactory condition with no new complaints voiced.  Vital signs are stable.  IV started in R arm.  IV flushed well with good blood return noted.  We will proceed with infusion per provider orders.   Patient tolerated iron infusion well with no complaints voiced.  Patient left ambulatory in stable condition.  Vital signs stable at discharge.  Follow up as scheduled.

## 2023-02-18 ENCOUNTER — Inpatient Hospital Stay: Payer: Medicare HMO

## 2023-02-18 VITALS — BP 102/53 | HR 61 | Temp 97.2°F | Resp 18 | Wt 152.4 lb

## 2023-02-18 DIAGNOSIS — D649 Anemia, unspecified: Secondary | ICD-10-CM

## 2023-02-18 DIAGNOSIS — D500 Iron deficiency anemia secondary to blood loss (chronic): Secondary | ICD-10-CM

## 2023-02-18 DIAGNOSIS — N1832 Chronic kidney disease, stage 3b: Secondary | ICD-10-CM | POA: Diagnosis not present

## 2023-02-18 MED ORDER — ACETAMINOPHEN 325 MG PO TABS
650.0000 mg | ORAL_TABLET | Freq: Once | ORAL | Status: AC
  Administered 2023-02-18: 650 mg via ORAL
  Filled 2023-02-18: qty 2

## 2023-02-18 MED ORDER — FAMOTIDINE 20 MG PO TABS
20.0000 mg | ORAL_TABLET | Freq: Once | ORAL | Status: AC
  Administered 2023-02-18: 20 mg via ORAL
  Filled 2023-02-18: qty 1

## 2023-02-18 MED ORDER — SODIUM CHLORIDE 0.9 % IV SOLN
500.0000 mg | Freq: Once | INTRAVENOUS | Status: AC
  Administered 2023-02-18: 500 mg via INTRAVENOUS
  Filled 2023-02-18: qty 25

## 2023-02-18 MED ORDER — CETIRIZINE HCL 10 MG PO TABS
10.0000 mg | ORAL_TABLET | Freq: Once | ORAL | Status: AC
  Administered 2023-02-18: 10 mg via ORAL
  Filled 2023-02-18: qty 1

## 2023-02-18 MED ORDER — SODIUM CHLORIDE 0.9 % IV SOLN: Freq: Once | INTRAVENOUS | Status: AC

## 2023-02-18 NOTE — Progress Notes (Signed)
Patient presents today for Venofer infusion per providers order.  Vital signs WNL.  Patient has no new complaints at this time.  Peripheral IV started and blood return noted pre and post infusion.  Stable during infusion without adverse affects.  Vital signs stable.  No complaints at this time.  Discharge from clinic ambulatory in stable condition.  Alert and oriented X 3.  Follow up with [facility] as scheduled.  

## 2023-02-18 NOTE — Patient Instructions (Signed)
[facility]  Discharge Instructions: Thank you for choosing [facility] to provide your oncology and hematology care.  If you have a lab appointment with the Cancer Center, please come in thru the Main Entrance and check in at the main information desk.  Wear comfortable clothing and clothing appropriate for easy access to any Portacath or PICC line.   We strive to give you quality time with your provider. You may need to reschedule your appointment if you arrive late (15 or more minutes).  Arriving late affects you and other patients whose appointments are after yours.  Also, if you miss three or more appointments without notifying the office, you may be dismissed from the clinic at the provider's discretion.      For prescription refill requests, have your pharmacy contact our office and allow 72 hours for refills to be completed.    Today you received the following chemotherapy and/or immunotherapy agents Venofer      To help prevent nausea and vomiting after your treatment, we encourage you to take your nausea medication as directed.  BELOW ARE SYMPTOMS THAT SHOULD BE REPORTED IMMEDIATELY: *FEVER GREATER THAN 100.4 F (38 C) OR HIGHER *CHILLS OR SWEATING *NAUSEA AND VOMITING THAT IS NOT CONTROLLED WITH YOUR NAUSEA MEDICATION *UNUSUAL SHORTNESS OF BREATH *UNUSUAL BRUISING OR BLEEDING *URINARY PROBLEMS (pain or burning when urinating, or frequent urination) *BOWEL PROBLEMS (unusual diarrhea, constipation, pain near the anus) TENDERNESS IN MOUTH AND THROAT WITH OR WITHOUT PRESENCE OF ULCERS (sore throat, sores in mouth, or a toothache) UNUSUAL RASH, SWELLING OR PAIN  UNUSUAL VAGINAL DISCHARGE OR ITCHING   Items with * indicate a potential emergency and should be followed up as soon as possible or go to the Emergency Department if any problems should occur.  Please show the CHEMOTHERAPY ALERT CARD or IMMUNOTHERAPY ALERT CARD at check-in to the Emergency  Department and triage nurse.  Should you have questions after your visit or need to cancel or reschedule your appointment, please contact [facility] [phone]  and follow the prompts.  Office hours are 8:00 a.m. to 4:30 p.m. Monday - Friday. Please note that voicemails left after 4:00 p.m. may not be returned until the following business day.  We are closed weekends and major holidays. You have access to a nurse at all times for urgent questions. Please call the main number to the clinic [phone] and follow the prompts.  For any non-urgent questions, you may also contact your provider using MyChart. We now offer e-Visits for anyone 18 and older to request care online for non-urgent symptoms. For details visit [email or web address].   Also download the MyChart app! Go to the app store, search "MyChart", open the app, select [facility], and log in with your MyChart username and password.   

## 2023-03-06 ENCOUNTER — Other Ambulatory Visit (HOSPITAL_COMMUNITY): Payer: Self-pay | Admitting: Family Medicine

## 2023-03-06 DIAGNOSIS — L989 Disorder of the skin and subcutaneous tissue, unspecified: Secondary | ICD-10-CM

## 2023-03-06 DIAGNOSIS — F17200 Nicotine dependence, unspecified, uncomplicated: Secondary | ICD-10-CM

## 2023-04-24 ENCOUNTER — Ambulatory Visit (HOSPITAL_COMMUNITY)
Admission: RE | Admit: 2023-04-24 | Discharge: 2023-04-24 | Disposition: A | Discharge status: Home / Self Care | Payer: Medicare HMO | Source: Ambulatory Visit | Attending: Family Medicine | Admitting: Family Medicine

## 2023-04-24 DIAGNOSIS — I700 Atherosclerosis of aorta: Secondary | ICD-10-CM | POA: Diagnosis not present

## 2023-04-24 DIAGNOSIS — J439 Emphysema, unspecified: Secondary | ICD-10-CM | POA: Diagnosis not present

## 2023-04-24 DIAGNOSIS — L989 Disorder of the skin and subcutaneous tissue, unspecified: Secondary | ICD-10-CM | POA: Diagnosis present

## 2023-04-24 DIAGNOSIS — F17200 Nicotine dependence, unspecified, uncomplicated: Secondary | ICD-10-CM

## 2023-04-24 DIAGNOSIS — F17210 Nicotine dependence, cigarettes, uncomplicated: Secondary | ICD-10-CM | POA: Insufficient documentation

## 2023-04-24 DIAGNOSIS — M5136 Other intervertebral disc degeneration, lumbar region: Secondary | ICD-10-CM | POA: Diagnosis not present

## 2023-04-24 DIAGNOSIS — R911 Solitary pulmonary nodule: Secondary | ICD-10-CM | POA: Diagnosis not present

## 2023-04-24 DIAGNOSIS — Z122 Encounter for screening for malignant neoplasm of respiratory organs: Secondary | ICD-10-CM | POA: Insufficient documentation

## 2023-04-24 DIAGNOSIS — J90 Pleural effusion, not elsewhere classified: Secondary | ICD-10-CM | POA: Insufficient documentation

## 2023-05-02 ENCOUNTER — Inpatient Hospital Stay: Payer: Medicare HMO | Attending: Hematology

## 2023-05-02 DIAGNOSIS — D631 Anemia in chronic kidney disease: Secondary | ICD-10-CM | POA: Insufficient documentation

## 2023-05-02 DIAGNOSIS — J439 Emphysema, unspecified: Secondary | ICD-10-CM | POA: Insufficient documentation

## 2023-05-02 DIAGNOSIS — Z8551 Personal history of malignant neoplasm of bladder: Secondary | ICD-10-CM | POA: Diagnosis not present

## 2023-05-02 DIAGNOSIS — D500 Iron deficiency anemia secondary to blood loss (chronic): Secondary | ICD-10-CM

## 2023-05-02 DIAGNOSIS — I700 Atherosclerosis of aorta: Secondary | ICD-10-CM | POA: Diagnosis not present

## 2023-05-02 DIAGNOSIS — N1832 Chronic kidney disease, stage 3b: Secondary | ICD-10-CM | POA: Insufficient documentation

## 2023-05-02 DIAGNOSIS — R059 Cough, unspecified: Secondary | ICD-10-CM | POA: Diagnosis not present

## 2023-05-02 DIAGNOSIS — F17210 Nicotine dependence, cigarettes, uncomplicated: Secondary | ICD-10-CM | POA: Insufficient documentation

## 2023-05-02 DIAGNOSIS — Z800 Family history of malignant neoplasm of digestive organs: Secondary | ICD-10-CM | POA: Insufficient documentation

## 2023-05-02 DIAGNOSIS — Z833 Family history of diabetes mellitus: Secondary | ICD-10-CM | POA: Diagnosis not present

## 2023-05-02 DIAGNOSIS — Z8249 Family history of ischemic heart disease and other diseases of the circulatory system: Secondary | ICD-10-CM | POA: Diagnosis not present

## 2023-05-02 LAB — COMPREHENSIVE METABOLIC PANEL
ALT: 10 U/L (ref 0–44)
AST: 13 U/L — ABNORMAL LOW (ref 15–41)
Albumin: 3.9 g/dL (ref 3.5–5.0)
Alkaline Phosphatase: 81 U/L (ref 38–126)
Anion gap: 7 (ref 5–15)
BUN: 40 mg/dL — ABNORMAL HIGH (ref 8–23)
CO2: 18 mmol/L — ABNORMAL LOW (ref 22–32)
Calcium: 8.6 mg/dL — ABNORMAL LOW (ref 8.9–10.3)
Chloride: 112 mmol/L — ABNORMAL HIGH (ref 98–111)
Creatinine, Ser: 2.39 mg/dL — ABNORMAL HIGH (ref 0.61–1.24)
GFR, Estimated: 27 mL/min — ABNORMAL LOW (ref >60)
Glucose, Bld: 92 mg/dL (ref 70–99)
Potassium: 5.1 mmol/L (ref 3.5–5.1)
Sodium: 137 mmol/L (ref 135–145)
Total Bilirubin: 0.5 mg/dL (ref 0.3–1.2)
Total Protein: 7.3 g/dL (ref 6.5–8.1)

## 2023-05-02 LAB — CBC WITH DIFFERENTIAL/PLATELET
Abs Immature Granulocytes: 0.04 10*3/uL (ref 0.00–0.07)
Basophils Absolute: 0 10*3/uL (ref 0.0–0.1)
Basophils Relative: 0 %
Eosinophils Absolute: 0.3 10*3/uL (ref 0.0–0.5)
Eosinophils Relative: 3 %
HCT: 36.5 % — ABNORMAL LOW (ref 39.0–52.0)
Hemoglobin: 11.5 g/dL — ABNORMAL LOW (ref 13.0–17.0)
Immature Granulocytes: 0 %
Lymphocytes Relative: 22 %
Lymphs Abs: 2.2 10*3/uL (ref 0.7–4.0)
MCH: 29.3 pg (ref 26.0–34.0)
MCHC: 31.5 g/dL (ref 30.0–36.0)
MCV: 92.9 fL (ref 80.0–100.0)
Monocytes Absolute: 0.7 10*3/uL (ref 0.1–1.0)
Monocytes Relative: 6 %
Neutro Abs: 7 10*3/uL (ref 1.7–7.7)
Neutrophils Relative %: 69 %
Platelets: 278 10*3/uL (ref 150–400)
RBC: 3.93 MIL/uL — ABNORMAL LOW (ref 4.22–5.81)
RDW: 13.8 % (ref 11.5–15.5)
WBC: 10.2 10*3/uL (ref 4.0–10.5)
nRBC: 0 % (ref 0.0–0.2)

## 2023-05-02 LAB — FERRITIN: Ferritin: 150 ng/mL (ref 24–336)

## 2023-05-02 LAB — IRON AND TIBC
Iron: 43 ug/dL — ABNORMAL LOW (ref 45–182)
Saturation Ratios: 16 % — ABNORMAL LOW (ref 17.9–39.5)
TIBC: 264 ug/dL (ref 250–450)
UIBC: 221 ug/dL
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
## 2023-05-08 ENCOUNTER — Inpatient Hospital Stay (HOSPITAL_BASED_OUTPATIENT_CLINIC_OR_DEPARTMENT_OTHER): Payer: Medicare HMO | Admitting: Hematology

## 2023-05-08 VITALS — BP 129/59 | HR 84 | Temp 98.3°F | Resp 19 | Ht 70.0 in | Wt 150.9 lb

## 2023-05-08 DIAGNOSIS — D500 Iron deficiency anemia secondary to blood loss (chronic): Secondary | ICD-10-CM | POA: Diagnosis not present

## 2023-05-08 DIAGNOSIS — N1832 Chronic kidney disease, stage 3b: Secondary | ICD-10-CM | POA: Diagnosis not present

## 2023-05-08 NOTE — Patient Instructions (Signed)
[facility] at [facility] Discharge Instructions   You were seen and examined today by Dr. Ellin Saba.  He reviewed the results of your lab work. Your iron levels are stable but on the lower side. Your hemoglobin is 11.5. We will arrange for you to have two iron infusions.   Return as scheduled.    Thank you for choosing [facility] at [facility] to provide your oncology and hematology care.  To afford each patient quality time with our provider, please arrive at least 15 minutes before your scheduled appointment time.   If you have a lab appointment with the Cancer Center please come in thru the Main Entrance and check in at the main information desk.  You need to re-schedule your appointment should you arrive 10 or more minutes late.  We strive to give you quality time with our providers, and arriving late affects you and other patients whose appointments are after yours.  Also, if you no show three or more times for appointments you may be dismissed from the clinic at the providers discretion.     Again, thank you for choosing [facility].  Our hope is that these requests will decrease the amount of time that you wait before being seen by our physicians.       _____________________________________________________________  Should you have questions after your visit to [facility], please contact our office at [phone] and follow the prompts.  Our office hours are 8:00 a.m. and 4:30 p.m. Monday - Friday.  Please note that voicemails left after 4:00 p.m. may not be returned until the following business day.  We are closed weekends and major holidays.  You do have access to a nurse 24-7, just call the main number to the clinic [phone] and do not press any options, hold on the line and a nurse will answer the phone.    For prescription refill requests, have your pharmacy contact our office and allow 72 hours.     Due to Covid, you will need to wear a mask upon entering the hospital. If you do not have a mask, a mask will be given to you at the Main Entrance upon arrival. For doctor visits, patients may have 1 support person age 31 or older with them. For treatment visits, patients can not have anyone with them due to social distancing guidelines and our immunocompromised population.

## 2023-05-13 ENCOUNTER — Encounter (INDEPENDENT_AMBULATORY_CARE_PROVIDER_SITE_OTHER): Payer: Self-pay | Admitting: *Deleted

## 2023-05-19 ENCOUNTER — Telehealth (INDEPENDENT_AMBULATORY_CARE_PROVIDER_SITE_OTHER): Payer: Self-pay | Admitting: Gastroenterology

## 2023-05-19 ENCOUNTER — Other Ambulatory Visit (INDEPENDENT_AMBULATORY_CARE_PROVIDER_SITE_OTHER): Payer: Self-pay | Admitting: Gastroenterology

## 2023-05-19 DIAGNOSIS — R932 Abnormal findings on diagnostic imaging of liver and biliary tract: Secondary | ICD-10-CM

## 2023-05-19 NOTE — Telephone Encounter (Signed)
Pt left voicemail that he was needing to schedule [place]. Pt has [place] in January. [place] RUQ ordered. Pt contacted to ask if any day he could not go; pt states he can go about any time. Contacted Central Scheduling[facility] scheduled for 06/03/23 at 10:30. Arrive at 10:15am [employer]. NPO 6 hours prior to exam.  Contacted pt and advised him of appt.

## 2023-05-20 ENCOUNTER — Inpatient Hospital Stay: Payer: Medicare HMO | Attending: Hematology

## 2023-05-20 VITALS — BP 99/58 | HR 72 | Temp 96.2°F | Resp 18

## 2023-05-20 DIAGNOSIS — Z800 Family history of malignant neoplasm of digestive organs: Secondary | ICD-10-CM | POA: Insufficient documentation

## 2023-05-20 DIAGNOSIS — Z79899 Other long term (current) drug therapy: Secondary | ICD-10-CM | POA: Insufficient documentation

## 2023-05-20 DIAGNOSIS — N1832 Chronic kidney disease, stage 3b: Secondary | ICD-10-CM | POA: Insufficient documentation

## 2023-05-20 DIAGNOSIS — Z833 Family history of diabetes mellitus: Secondary | ICD-10-CM | POA: Insufficient documentation

## 2023-05-20 DIAGNOSIS — D631 Anemia in chronic kidney disease: Secondary | ICD-10-CM | POA: Diagnosis present

## 2023-05-20 DIAGNOSIS — F17210 Nicotine dependence, cigarettes, uncomplicated: Secondary | ICD-10-CM | POA: Insufficient documentation

## 2023-05-20 DIAGNOSIS — D649 Anemia, unspecified: Secondary | ICD-10-CM

## 2023-05-20 DIAGNOSIS — Z8249 Family history of ischemic heart disease and other diseases of the circulatory system: Secondary | ICD-10-CM | POA: Insufficient documentation

## 2023-05-20 DIAGNOSIS — E611 Iron deficiency: Secondary | ICD-10-CM | POA: Insufficient documentation

## 2023-05-20 DIAGNOSIS — D500 Iron deficiency anemia secondary to blood loss (chronic): Secondary | ICD-10-CM

## 2023-05-20 MED ORDER — FAMOTIDINE 20 MG PO TABS
20.0000 mg | ORAL_TABLET | Freq: Once | ORAL | Status: AC
  Administered 2023-05-20: 20 mg via ORAL
  Filled 2023-05-20: qty 1

## 2023-05-20 MED ORDER — SODIUM CHLORIDE 0.9 % IV SOLN
500.0000 mg | Freq: Once | INTRAVENOUS | Status: AC
  Administered 2023-05-20: 500 mg via INTRAVENOUS
  Filled 2023-05-20: qty 20

## 2023-05-20 MED ORDER — SODIUM CHLORIDE 0.9 % IV SOLN: 500.0000 mg | Freq: Once | INTRAVENOUS | Status: DC

## 2023-05-20 MED ORDER — CETIRIZINE HCL 10 MG PO TABS
10.0000 mg | ORAL_TABLET | Freq: Once | ORAL | Status: AC
  Administered 2023-05-20: 10 mg via ORAL
  Filled 2023-05-20: qty 1

## 2023-05-20 MED ORDER — ACETAMINOPHEN 325 MG PO TABS
650.0000 mg | ORAL_TABLET | Freq: Once | ORAL | Status: AC
  Administered 2023-05-20: 650 mg via ORAL
  Filled 2023-05-20: qty 2

## 2023-05-20 MED ORDER — SODIUM CHLORIDE 0.9 % IV SOLN: Freq: Once | INTRAVENOUS | Status: AC

## 2023-05-20 NOTE — Progress Notes (Signed)
Patient presents today for iron infusion of Venofer.  Patient is in satisfactory condition with no new complaints voiced.  Vital signs are stable.  We will proceed with infusion per provider orders.    Patient tolerated treatment well with no complaints voiced.  Patient left ambulatory in stable condition.  Vital signs stable at discharge.  Follow up as scheduled.    

## 2023-05-20 NOTE — Patient Instructions (Signed)
[facility] [place]  Discharge Instructions: Thank you for choosing [facility] to provide your oncology and hematology care.  If you have a lab appointment with the Cancer Center - please note that after April 8th, 2024, all labs will be drawn in the cancer center.  You do not have to check in or register with the main entrance as you have in the past but will complete your check-in in the cancer center.  Wear comfortable clothing and clothing appropriate for easy access to any Portacath or PICC line.   We strive to give you quality time with your provider. You may need to reschedule your appointment if you arrive late (15 or more minutes).  Arriving late affects you and other patients whose appointments are after yours.  Also, if you miss three or more appointments without notifying the office, you may be dismissed from the clinic at the provider's discretion.      For prescription refill requests, have your pharmacy contact our office and allow 72 hours for refills to be completed.    Today you received the following Venofer.  Iron Sucrose Injection What is this medication? IRON SUCROSE (EYE ern SOO krose) treats low levels of iron (iron deficiency anemia) in people with kidney disease. Iron is a mineral that plays an important role in making red blood cells, which carry oxygen from your lungs to the rest of your body. This medicine may be used for other purposes; ask your health care provider or pharmacist if you have questions. COMMON BRAND NAME(S): Venofer What should I tell my care team before I take this medication? They need to know if you have any of these conditions: Anemia not caused by low iron levels Heart disease High levels of iron in the blood Kidney disease Liver disease An unusual or allergic reaction to iron, other medications, foods, dyes, or preservatives Pregnant or trying to get pregnant Breastfeeding How should I use this  medication? This medication is for infusion into a vein. It is given in a hospital or clinic setting. Talk to your care team about the use of this medication in children. While this medication may be prescribed for children as young as 2 years for selected conditions, precautions do apply. Overdosage: If you think you have taken too much of this medicine contact a poison control center or emergency room at once. NOTE: This medicine is only for you. Do not share this medicine with others. What if I miss a dose? Keep appointments for follow-up doses. It is important not to miss your dose. Call your care team if you are unable to keep an appointment. What may interact with this medication? Do not take this medication with any of the following: Deferoxamine Dimercaprol Other iron products This medication may also interact with the following: Chloramphenicol Deferasirox This list may not describe all possible interactions. Give your health care provider a list of all the medicines, herbs, non-prescription drugs, or dietary supplements you use. Also tell them if you smoke, drink alcohol, or use illegal drugs. Some items may interact with your medicine. What should I watch for while using this medication? Visit your care team regularly. Tell your care team if your symptoms do not start to get better or if they get worse. You may need blood work done while you are taking this medication. You may need to follow a special diet. Talk to your care team. Foods that contain iron include: whole grains/cereals, dried fruits, beans, or   peas, leafy green vegetables, and organ meats (liver, kidney). What side effects may I notice from receiving this medication? Side effects that you should report to your care team as soon as possible: Allergic reactions--skin rash, itching, hives, swelling of the face, lips, tongue, or throat Low blood pressure--dizziness, feeling faint or lightheaded, blurry vision Shortness of  breath Side effects that usually do not require medical attention (report to your care team if they continue or are bothersome): Flushing Headache Joint pain Muscle pain Nausea Pain, redness, or irritation at injection site This list may not describe all possible side effects. Call your doctor for medical advice about side effects. You may report side effects to FDA at 1-800-FDA-1088. Where should I keep my medication? This medication is given in a hospital or clinic and will not be stored at home. NOTE: This sheet is a summary. It may not cover all possible information. If you have questions about this medicine, talk to your doctor, pharmacist, or health care provider.  2024 Elsevier/Gold Standard (2022-06-05 00:00:00)      To help prevent nausea and vomiting after your treatment, we encourage you to take your nausea medication as directed.  BELOW ARE SYMPTOMS THAT SHOULD BE REPORTED IMMEDIATELY: *FEVER GREATER THAN 100.4 F (38 C) OR HIGHER *CHILLS OR SWEATING *NAUSEA AND VOMITING THAT IS NOT CONTROLLED WITH YOUR NAUSEA MEDICATION *UNUSUAL SHORTNESS OF BREATH *UNUSUAL BRUISING OR BLEEDING *URINARY PROBLEMS (pain or burning when urinating, or frequent urination) *BOWEL PROBLEMS (unusual diarrhea, constipation, pain near the anus) TENDERNESS IN MOUTH AND THROAT WITH OR WITHOUT PRESENCE OF ULCERS (sore throat, sores in mouth, or a toothache) UNUSUAL RASH, SWELLING OR PAIN  UNUSUAL VAGINAL DISCHARGE OR ITCHING   Items with * indicate a potential emergency and should be followed up as soon as possible or go to the Emergency Department if any problems should occur.  Please show the CHEMOTHERAPY ALERT CARD or IMMUNOTHERAPY ALERT CARD at check-in to the Emergency Department and triage nurse.  Should you have questions after your visit or need to cancel or reschedule your appointment, please contact [facility] [place] [phone]  and follow the prompts.  Office hours are  8:00 a.m. to 4:30 p.m. Monday - Friday. Please note that voicemails left after 4:00 p.m. may not be returned until the following business day.  We are closed weekends and major holidays. You have access to a nurse at all times for urgent questions. Please call the main number to the clinic [phone] and follow the prompts.  For any non-urgent questions, you may also contact your provider using MyChart. We now offer e-Visits for anyone 18 and older to request care online for non-urgent symptoms. For details visit [email or web address].   Also download the MyChart app! Go to the app store, search "MyChart", open the app, select [place], and log in with your MyChart username and password.   

## 2023-05-27 ENCOUNTER — Inpatient Hospital Stay: Payer: Medicare HMO

## 2023-06-02 ENCOUNTER — Inpatient Hospital Stay: Payer: Medicare HMO

## 2023-06-02 VITALS — BP 90/48 | HR 65 | Temp 96.6°F | Resp 18 | Ht 70.0 in | Wt 148.8 lb

## 2023-06-02 DIAGNOSIS — D500 Iron deficiency anemia secondary to blood loss (chronic): Secondary | ICD-10-CM

## 2023-06-02 DIAGNOSIS — D649 Anemia, unspecified: Secondary | ICD-10-CM

## 2023-06-02 DIAGNOSIS — N1832 Chronic kidney disease, stage 3b: Secondary | ICD-10-CM | POA: Diagnosis not present

## 2023-06-02 MED ORDER — SODIUM CHLORIDE 0.9 % IV SOLN
500.0000 mg | Freq: Once | INTRAVENOUS | Status: AC
  Administered 2023-06-02: 500 mg via INTRAVENOUS
  Filled 2023-06-02: qty 20

## 2023-06-02 MED ORDER — CETIRIZINE HCL 10 MG PO TABS
10.0000 mg | ORAL_TABLET | Freq: Once | ORAL | Status: AC
  Administered 2023-06-02: 10 mg via ORAL
  Filled 2023-06-02: qty 1

## 2023-06-02 MED ORDER — ACETAMINOPHEN 325 MG PO TABS
650.0000 mg | ORAL_TABLET | Freq: Once | ORAL | Status: AC
  Administered 2023-06-02: 650 mg via ORAL
  Filled 2023-06-02: qty 2

## 2023-06-02 MED ORDER — SODIUM CHLORIDE 0.9 % IV SOLN: Freq: Once | INTRAVENOUS | Status: AC

## 2023-06-02 MED ORDER — FAMOTIDINE 20 MG PO TABS
20.0000 mg | ORAL_TABLET | Freq: Once | ORAL | Status: AC
  Administered 2023-06-02: 20 mg via ORAL
  Filled 2023-06-02: qty 1

## 2023-06-02 NOTE — Patient Instructions (Signed)
[facility]  Discharge Instructions: Thank you for choosing [facility] to provide your oncology and hematology care.  If you have a lab appointment with the Cancer Center - please note that after April 8th, 2024, all labs will be drawn in the cancer center.  You do not have to check in or register with the main entrance as you have in the past but will complete your check-in in the cancer center.  Wear comfortable clothing and clothing appropriate for easy access to any Portacath or PICC line.   We strive to give you quality time with your provider. You may need to reschedule your appointment if you arrive late (15 or more minutes).  Arriving late affects you and other patients whose appointments are after yours.  Also, if you miss three or more appointments without notifying the office, you may be dismissed from the clinic at the provider's discretion.      For prescription refill requests, have your pharmacy contact our office and allow 72 hours for refills to be completed.    Today you received Venofer 500 mg IV iron infusion.     BELOW ARE SYMPTOMS THAT SHOULD BE REPORTED IMMEDIATELY: *FEVER GREATER THAN 100.4 F (38 C) OR HIGHER *CHILLS OR SWEATING *NAUSEA AND VOMITING THAT IS NOT CONTROLLED WITH YOUR NAUSEA MEDICATION *UNUSUAL SHORTNESS OF BREATH *UNUSUAL BRUISING OR BLEEDING *URINARY PROBLEMS (pain or burning when urinating, or frequent urination) *BOWEL PROBLEMS (unusual diarrhea, constipation, pain near the anus) TENDERNESS IN MOUTH AND THROAT WITH OR WITHOUT PRESENCE OF ULCERS (sore throat, sores in mouth, or a toothache) UNUSUAL RASH, SWELLING OR PAIN  UNUSUAL VAGINAL DISCHARGE OR ITCHING   Items with * indicate a potential emergency and should be followed up as soon as possible or go to the Emergency Department if any problems should occur.  Please show the CHEMOTHERAPY ALERT CARD or IMMUNOTHERAPY ALERT CARD at check-in to the Emergency  Department and triage nurse.  Should you have questions after your visit or need to cancel or reschedule your appointment, please contact [facility] [phone]  and follow the prompts.  Office hours are 8:00 a.m. to 4:30 p.m. Monday - Friday. Please note that voicemails left after 4:00 p.m. may not be returned until the following business day.  We are closed weekends and major holidays. You have access to a nurse at all times for urgent questions. Please call the main number to the clinic [phone] and follow the prompts.  For any non-urgent questions, you may also contact your provider using MyChart. We now offer e-Visits for anyone 28 and older to request care online for non-urgent symptoms. For details visit [email or web address].   Also download the MyChart app! Go to the app store, search "MyChart", open the app, select , and log in with your MyChart username and password.

## 2023-06-02 NOTE — Progress Notes (Signed)
Patient presents today for iron infusion. Patient is in satisfactory condition with no new complaints voiced.  Vital signs are stable.  We will proceed with infusion per provider orders.    Peripheral IV started with good blood return pre and post infusion.  Venofer 500 mg given today per MD orders. Tolerated infusion without adverse affects. Vital signs stable. No complaints at this time. Discharged from clinic ambulatory in stable condition. Alert and oriented x 3. F/U with [facility] as scheduled.   

## 2023-06-03 ENCOUNTER — Ambulatory Visit (HOSPITAL_COMMUNITY)
Admission: RE | Admit: 2023-06-03 | Discharge: 2023-06-03 | Disposition: A | Discharge status: Home / Self Care | Payer: Medicare HMO | Source: Ambulatory Visit | Attending: Gastroenterology | Admitting: Gastroenterology

## 2023-06-03 DIAGNOSIS — R932 Abnormal findings on diagnostic imaging of liver and biliary tract: Secondary | ICD-10-CM | POA: Insufficient documentation

## 2023-06-29 ENCOUNTER — Encounter (HOSPITAL_COMMUNITY): Payer: Self-pay | Admitting: Hematology

## 2023-07-03 ENCOUNTER — Other Ambulatory Visit (INDEPENDENT_AMBULATORY_CARE_PROVIDER_SITE_OTHER): Payer: Self-pay | Admitting: *Deleted

## 2023-07-03 DIAGNOSIS — R932 Abnormal findings on diagnostic imaging of liver and biliary tract: Secondary | ICD-10-CM

## 2023-07-03 DIAGNOSIS — K769 Liver disease, unspecified: Secondary | ICD-10-CM

## 2023-07-10 ENCOUNTER — Other Ambulatory Visit (INDEPENDENT_AMBULATORY_CARE_PROVIDER_SITE_OTHER): Payer: Self-pay | Admitting: *Deleted

## 2023-07-10 ENCOUNTER — Telehealth (INDEPENDENT_AMBULATORY_CARE_PROVIDER_SITE_OTHER): Payer: Self-pay | Admitting: *Deleted

## 2023-07-10 DIAGNOSIS — K769 Liver disease, unspecified: Secondary | ICD-10-CM

## 2023-07-10 DIAGNOSIS — R932 Abnormal findings on diagnostic imaging of liver and biliary tract: Secondary | ICD-10-CM

## 2023-07-10 NOTE — Telephone Encounter (Signed)
Pt in reminder file for afp. I called pt and he said he would do next week at [facility]. Orders put in.

## 2023-07-14 ENCOUNTER — Encounter (HOSPITAL_COMMUNITY): Payer: Self-pay | Admitting: Hematology

## 2023-07-21 ENCOUNTER — Ambulatory Visit (INDEPENDENT_AMBULATORY_CARE_PROVIDER_SITE_OTHER): Payer: Medicare PPO | Admitting: Gastroenterology

## 2023-07-21 ENCOUNTER — Encounter (INDEPENDENT_AMBULATORY_CARE_PROVIDER_SITE_OTHER): Payer: Self-pay | Admitting: Gastroenterology

## 2023-07-21 VITALS — BP 115/71 | HR 93 | Temp 97.8°F | Ht 70.5 in | Wt 147.9 lb

## 2023-07-21 DIAGNOSIS — R634 Abnormal weight loss: Secondary | ICD-10-CM

## 2023-07-21 DIAGNOSIS — R630 Anorexia: Secondary | ICD-10-CM

## 2023-07-21 DIAGNOSIS — D500 Iron deficiency anemia secondary to blood loss (chronic): Secondary | ICD-10-CM | POA: Diagnosis not present

## 2023-07-21 DIAGNOSIS — K5900 Constipation, unspecified: Secondary | ICD-10-CM

## 2023-07-21 DIAGNOSIS — K7460 Unspecified cirrhosis of liver: Secondary | ICD-10-CM

## 2023-07-21 NOTE — Patient Instructions (Signed)
Schedule EGD Perform blood workup Stop Miralax and start Benefiber fiber supplements 1 tablespoon daily to increase stool bulk. Start docusate stool softener daily 1 pill
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
## 2023-07-25 ENCOUNTER — Telehealth (INDEPENDENT_AMBULATORY_CARE_PROVIDER_SITE_OTHER): Payer: Self-pay | Admitting: Gastroenterology

## 2023-07-25 NOTE — Telephone Encounter (Signed)
FYI-Pt contacted to schedule EGD. Pt states he doesn't think he is going to have that done yet. Pt states he just had a CT and "didn't nothing show up in his lungs"

## 2023-07-25 NOTE — Telephone Encounter (Signed)
Ok, this was scheduled for evaluation of the symptoms of poor appetite and weight loss he endorsed, he can call to schedule when he feels ready to do it Thanks

## 2023-09-01 ENCOUNTER — Inpatient Hospital Stay: Payer: Medicare PPO | Attending: Hematology

## 2023-09-01 DIAGNOSIS — Z881 Allergy status to other antibiotic agents status: Secondary | ICD-10-CM | POA: Insufficient documentation

## 2023-09-01 DIAGNOSIS — Z800 Family history of malignant neoplasm of digestive organs: Secondary | ICD-10-CM | POA: Diagnosis not present

## 2023-09-01 DIAGNOSIS — Z888 Allergy status to other drugs, medicaments and biological substances status: Secondary | ICD-10-CM | POA: Diagnosis not present

## 2023-09-01 DIAGNOSIS — F17210 Nicotine dependence, cigarettes, uncomplicated: Secondary | ICD-10-CM | POA: Insufficient documentation

## 2023-09-01 DIAGNOSIS — E7800 Pure hypercholesterolemia, unspecified: Secondary | ICD-10-CM | POA: Insufficient documentation

## 2023-09-01 DIAGNOSIS — Z87442 Personal history of urinary calculi: Secondary | ICD-10-CM | POA: Insufficient documentation

## 2023-09-01 DIAGNOSIS — D500 Iron deficiency anemia secondary to blood loss (chronic): Secondary | ICD-10-CM

## 2023-09-01 DIAGNOSIS — Z79899 Other long term (current) drug therapy: Secondary | ICD-10-CM | POA: Diagnosis not present

## 2023-09-01 DIAGNOSIS — Z885 Allergy status to narcotic agent status: Secondary | ICD-10-CM | POA: Insufficient documentation

## 2023-09-01 DIAGNOSIS — N184 Chronic kidney disease, stage 4 (severe): Secondary | ICD-10-CM | POA: Insufficient documentation

## 2023-09-01 DIAGNOSIS — D631 Anemia in chronic kidney disease: Secondary | ICD-10-CM | POA: Insufficient documentation

## 2023-09-01 DIAGNOSIS — Z880 Allergy status to penicillin: Secondary | ICD-10-CM | POA: Diagnosis not present

## 2023-09-01 DIAGNOSIS — Z8551 Personal history of malignant neoplasm of bladder: Secondary | ICD-10-CM | POA: Insufficient documentation

## 2023-09-01 DIAGNOSIS — Z5986 Financial insecurity: Secondary | ICD-10-CM | POA: Diagnosis not present

## 2023-09-01 DIAGNOSIS — Z833 Family history of diabetes mellitus: Secondary | ICD-10-CM | POA: Diagnosis not present

## 2023-09-01 DIAGNOSIS — Z8249 Family history of ischemic heart disease and other diseases of the circulatory system: Secondary | ICD-10-CM | POA: Diagnosis not present

## 2023-09-01 LAB — CBC WITH DIFFERENTIAL/PLATELET
Abs Immature Granulocytes: 0.05 10*3/uL (ref 0.00–0.07)
Basophils Absolute: 0 10*3/uL (ref 0.0–0.1)
Basophils Relative: 0 %
Eosinophils Absolute: 0.3 10*3/uL (ref 0.0–0.5)
Eosinophils Relative: 3 %
HCT: 32 % — ABNORMAL LOW (ref 39.0–52.0)
Hemoglobin: 9.6 g/dL — ABNORMAL LOW (ref 13.0–17.0)
Immature Granulocytes: 1 %
Lymphocytes Relative: 21 %
Lymphs Abs: 2 10*3/uL (ref 0.7–4.0)
MCH: 29 pg (ref 26.0–34.0)
MCHC: 30 g/dL (ref 30.0–36.0)
MCV: 96.7 fL (ref 80.0–100.0)
Monocytes Absolute: 0.8 10*3/uL (ref 0.1–1.0)
Monocytes Relative: 8 %
Neutro Abs: 6.6 10*3/uL (ref 1.7–7.7)
Neutrophils Relative %: 67 %
Platelets: 286 10*3/uL (ref 150–400)
RBC: 3.31 MIL/uL — ABNORMAL LOW (ref 4.22–5.81)
RDW: 13.6 % (ref 11.5–15.5)
WBC: 9.7 10*3/uL (ref 4.0–10.5)
nRBC: 0 % (ref 0.0–0.2)

## 2023-09-01 LAB — IRON AND TIBC
Iron: 36 ug/dL — ABNORMAL LOW (ref 45–182)
Saturation Ratios: 16 % — ABNORMAL LOW (ref 17.9–39.5)
TIBC: 227 ug/dL — ABNORMAL LOW (ref 250–450)
UIBC: 191 ug/dL

## 2023-09-01 LAB — FERRITIN: Ferritin: 209 ng/mL (ref 24–336)

## 2023-09-02 ENCOUNTER — Other Ambulatory Visit: Payer: Self-pay

## 2023-09-02 ENCOUNTER — Inpatient Hospital Stay: Payer: Medicare PPO

## 2023-09-02 DIAGNOSIS — N183 Chronic kidney disease, stage 3 unspecified: Secondary | ICD-10-CM | POA: Diagnosis not present

## 2023-09-02 DIAGNOSIS — N200 Calculus of kidney: Secondary | ICD-10-CM | POA: Diagnosis not present

## 2023-09-02 DIAGNOSIS — Z936 Other artificial openings of urinary tract status: Secondary | ICD-10-CM | POA: Diagnosis not present

## 2023-09-02 DIAGNOSIS — C673 Malignant neoplasm of anterior wall of bladder: Secondary | ICD-10-CM | POA: Diagnosis not present
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
## 2023-09-08 ENCOUNTER — Inpatient Hospital Stay: Payer: Medicare PPO | Admitting: Physician Assistant

## 2023-09-08 ENCOUNTER — Encounter (HOSPITAL_COMMUNITY): Payer: Self-pay | Admitting: Hematology

## 2023-09-08 VITALS — BP 113/51 | HR 101 | Temp 98.2°F | Resp 18 | Wt 142.6 lb

## 2023-09-08 DIAGNOSIS — D631 Anemia in chronic kidney disease: Secondary | ICD-10-CM | POA: Diagnosis present

## 2023-09-08 DIAGNOSIS — R590 Localized enlarged lymph nodes: Secondary | ICD-10-CM | POA: Diagnosis not present

## 2023-09-08 DIAGNOSIS — E871 Hypo-osmolality and hyponatremia: Secondary | ICD-10-CM | POA: Diagnosis present

## 2023-09-08 DIAGNOSIS — C679 Malignant neoplasm of bladder, unspecified: Secondary | ICD-10-CM

## 2023-09-08 DIAGNOSIS — N184 Chronic kidney disease, stage 4 (severe): Secondary | ICD-10-CM | POA: Diagnosis present

## 2023-09-08 DIAGNOSIS — J168 Pneumonia due to other specified infectious organisms: Secondary | ICD-10-CM | POA: Diagnosis not present

## 2023-09-08 DIAGNOSIS — R051 Acute cough: Secondary | ICD-10-CM | POA: Diagnosis not present

## 2023-09-08 DIAGNOSIS — K7460 Unspecified cirrhosis of liver: Secondary | ICD-10-CM | POA: Diagnosis present

## 2023-09-08 DIAGNOSIS — Z720 Tobacco use: Secondary | ICD-10-CM | POA: Diagnosis not present

## 2023-09-08 DIAGNOSIS — J440 Chronic obstructive pulmonary disease with acute lower respiratory infection: Secondary | ICD-10-CM | POA: Diagnosis present

## 2023-09-08 DIAGNOSIS — E860 Dehydration: Secondary | ICD-10-CM | POA: Diagnosis present

## 2023-09-08 DIAGNOSIS — J189 Pneumonia, unspecified organism: Secondary | ICD-10-CM | POA: Diagnosis present

## 2023-09-08 DIAGNOSIS — I959 Hypotension, unspecified: Secondary | ICD-10-CM | POA: Diagnosis present

## 2023-09-08 DIAGNOSIS — N179 Acute kidney failure, unspecified: Secondary | ICD-10-CM | POA: Diagnosis present

## 2023-09-08 DIAGNOSIS — J439 Emphysema, unspecified: Secondary | ICD-10-CM | POA: Diagnosis present

## 2023-09-08 DIAGNOSIS — D500 Iron deficiency anemia secondary to blood loss (chronic): Secondary | ICD-10-CM

## 2023-09-08 DIAGNOSIS — J329 Chronic sinusitis, unspecified: Secondary | ICD-10-CM | POA: Diagnosis not present

## 2023-09-08 DIAGNOSIS — E7800 Pure hypercholesterolemia, unspecified: Secondary | ICD-10-CM | POA: Diagnosis present

## 2023-09-08 DIAGNOSIS — R079 Chest pain, unspecified: Secondary | ICD-10-CM | POA: Diagnosis not present

## 2023-09-08 DIAGNOSIS — K589 Irritable bowel syndrome without diarrhea: Secondary | ICD-10-CM | POA: Diagnosis present

## 2023-09-08 DIAGNOSIS — Z1152 Encounter for screening for COVID-19: Secondary | ICD-10-CM | POA: Diagnosis not present

## 2023-09-08 DIAGNOSIS — R918 Other nonspecific abnormal finding of lung field: Secondary | ICD-10-CM | POA: Diagnosis not present

## 2023-09-08 DIAGNOSIS — I739 Peripheral vascular disease, unspecified: Secondary | ICD-10-CM | POA: Diagnosis present

## 2023-09-08 DIAGNOSIS — I129 Hypertensive chronic kidney disease with stage 1 through stage 4 chronic kidney disease, or unspecified chronic kidney disease: Secondary | ICD-10-CM | POA: Diagnosis present

## 2023-09-08 DIAGNOSIS — F17210 Nicotine dependence, cigarettes, uncomplicated: Secondary | ICD-10-CM | POA: Diagnosis present

## 2023-09-08 DIAGNOSIS — J438 Other emphysema: Secondary | ICD-10-CM | POA: Diagnosis not present

## 2023-09-08 DIAGNOSIS — Z23 Encounter for immunization: Secondary | ICD-10-CM | POA: Diagnosis present

## 2023-09-08 DIAGNOSIS — R0789 Other chest pain: Secondary | ICD-10-CM | POA: Diagnosis not present

## 2023-09-08 DIAGNOSIS — K5730 Diverticulosis of large intestine without perforation or abscess without bleeding: Secondary | ICD-10-CM | POA: Diagnosis not present

## 2023-09-08 DIAGNOSIS — Z8551 Personal history of malignant neoplasm of bladder: Secondary | ICD-10-CM | POA: Diagnosis not present

## 2023-09-08 DIAGNOSIS — Z87442 Personal history of urinary calculi: Secondary | ICD-10-CM | POA: Diagnosis not present

## 2023-09-08 DIAGNOSIS — E875 Hyperkalemia: Secondary | ICD-10-CM | POA: Diagnosis present

## 2023-09-08 DIAGNOSIS — Z79899 Other long term (current) drug therapy: Secondary | ICD-10-CM | POA: Diagnosis not present

## 2023-09-08 DIAGNOSIS — K209 Esophagitis, unspecified without bleeding: Secondary | ICD-10-CM | POA: Diagnosis present

## 2023-09-08 NOTE — Patient Instructions (Signed)
[facility] **VISIT SUMMARY & IMPORTANT INSTRUCTIONS **   You were seen today by Rojelio Brenner [name] for your anemia and your history of bladder cancer.    ANEMIA: Your anemia is related to your iron deficiency as well as your chronic kidney disease. We will schedule you for IV iron x 1 dose. Will recheck your labs and see you for an office visit in 6 weeks.  If your blood count remains low, we will possibly start you on injections (Retacrit) to help your body make more blood cells.  HISTORY OF BLADDER CANCER Continue follow-up with your urologist (Dr. Berneice Heinrich) Please call Dr. Emmaline Life office to see when he would like to schedule you for your CT scan.  FOLLOW-UP APPOINTMENT: Labs and office visit in 6 weeks  ** Thank you for trusting me with your healthcare!  I strive to provide all of my patients with quality care at each visit.  If you receive a survey for this visit, I would be so grateful to you for taking the time to provide feedback.  Thank you in advance!  ~ Manali Mcelmurry                   Dr. Doreatha Massed   &   Rojelio Brenner, [name]   - - - - - - - - - - - - - - - - - -    Thank you for choosing [facility] at [facility] to provide your oncology and hematology care.  To afford each patient quality time with our provider, please arrive at least 15 minutes before your scheduled appointment time.   If you have a lab appointment with the Cancer Center please come in thru the Main Entrance and check in at the main information desk.  You need to re-schedule your appointment should you arrive 10 or more minutes late.  We strive to give you quality time with our providers, and arriving late affects you and other patients whose appointments are after yours.  Also, if you no show three or more times for appointments you may be dismissed from the clinic at the providers discretion.     Again, thank you for choosing [facility].  Our hope is that these requests will decrease the amount of time that you wait before being seen by our physicians.       _____________________________________________________________  Should you have questions after your visit to [facility], please contact our office at [phone] and follow the prompts.  Our office hours are 8:00 a.m. and 4:30 p.m. Monday - Friday.  Please note that voicemails left after 4:00 p.m. may not be returned until the following business day.  We are closed weekends and major holidays.  You do have access to a nurse 24-7, just call the main number to the clinic [phone] and do not press any options, hold on the line and a nurse will answer the phone.    For prescription refill requests, have your pharmacy contact our office and allow 72 hours.

## 2023-09-11 ENCOUNTER — Emergency Department (HOSPITAL_COMMUNITY): Payer: Medicare PPO

## 2023-09-11 ENCOUNTER — Other Ambulatory Visit: Payer: Self-pay

## 2023-09-11 ENCOUNTER — Encounter (HOSPITAL_COMMUNITY): Payer: Self-pay | Admitting: *Deleted

## 2023-09-11 ENCOUNTER — Encounter: Payer: Self-pay | Admitting: Hematology

## 2023-09-11 ENCOUNTER — Inpatient Hospital Stay: Payer: Medicare PPO | Attending: Hematology

## 2023-09-11 ENCOUNTER — Inpatient Hospital Stay (HOSPITAL_COMMUNITY)
Admission: EM | Admit: 2023-09-11 | Discharge: 2023-09-13 | DRG: 194 | Disposition: A | Discharge status: Home / Self Care | Payer: Medicare PPO | Attending: Family Medicine | Admitting: Family Medicine

## 2023-09-11 DIAGNOSIS — Z7982 Long term (current) use of aspirin: Secondary | ICD-10-CM

## 2023-09-11 DIAGNOSIS — E7800 Pure hypercholesterolemia, unspecified: Secondary | ICD-10-CM | POA: Diagnosis present

## 2023-09-11 DIAGNOSIS — K769 Liver disease, unspecified: Secondary | ICD-10-CM | POA: Diagnosis present

## 2023-09-11 DIAGNOSIS — Z881 Allergy status to other antibiotic agents status: Secondary | ICD-10-CM

## 2023-09-11 DIAGNOSIS — Z87442 Personal history of urinary calculi: Secondary | ICD-10-CM

## 2023-09-11 DIAGNOSIS — F17210 Nicotine dependence, cigarettes, uncomplicated: Secondary | ICD-10-CM | POA: Diagnosis present

## 2023-09-11 DIAGNOSIS — Z9889 Other specified postprocedural states: Secondary | ICD-10-CM

## 2023-09-11 DIAGNOSIS — J440 Chronic obstructive pulmonary disease with acute lower respiratory infection: Secondary | ICD-10-CM | POA: Diagnosis present

## 2023-09-11 DIAGNOSIS — J439 Emphysema, unspecified: Secondary | ICD-10-CM | POA: Diagnosis present

## 2023-09-11 DIAGNOSIS — K209 Esophagitis, unspecified without bleeding: Secondary | ICD-10-CM | POA: Diagnosis present

## 2023-09-11 DIAGNOSIS — Z79899 Other long term (current) drug therapy: Secondary | ICD-10-CM

## 2023-09-11 DIAGNOSIS — R079 Chest pain, unspecified: Secondary | ICD-10-CM | POA: Diagnosis not present

## 2023-09-11 DIAGNOSIS — C679 Malignant neoplasm of bladder, unspecified: Secondary | ICD-10-CM | POA: Diagnosis present

## 2023-09-11 DIAGNOSIS — E875 Hyperkalemia: Secondary | ICD-10-CM | POA: Diagnosis present

## 2023-09-11 DIAGNOSIS — E871 Hypo-osmolality and hyponatremia: Secondary | ICD-10-CM | POA: Diagnosis present

## 2023-09-11 DIAGNOSIS — I959 Hypotension, unspecified: Secondary | ICD-10-CM | POA: Diagnosis present

## 2023-09-11 DIAGNOSIS — K589 Irritable bowel syndrome without diarrhea: Secondary | ICD-10-CM | POA: Diagnosis present

## 2023-09-11 DIAGNOSIS — N179 Acute kidney failure, unspecified: Principal | ICD-10-CM | POA: Diagnosis present

## 2023-09-11 DIAGNOSIS — J189 Pneumonia, unspecified organism: Principal | ICD-10-CM | POA: Diagnosis present

## 2023-09-11 DIAGNOSIS — Z23 Encounter for immunization: Secondary | ICD-10-CM | POA: Diagnosis present

## 2023-09-11 DIAGNOSIS — Z1152 Encounter for screening for COVID-19: Secondary | ICD-10-CM

## 2023-09-11 DIAGNOSIS — J438 Other emphysema: Secondary | ICD-10-CM | POA: Diagnosis not present

## 2023-09-11 DIAGNOSIS — I739 Peripheral vascular disease, unspecified: Secondary | ICD-10-CM | POA: Diagnosis present

## 2023-09-11 DIAGNOSIS — K5730 Diverticulosis of large intestine without perforation or abscess without bleeding: Secondary | ICD-10-CM | POA: Diagnosis not present

## 2023-09-11 DIAGNOSIS — I129 Hypertensive chronic kidney disease with stage 1 through stage 4 chronic kidney disease, or unspecified chronic kidney disease: Secondary | ICD-10-CM | POA: Diagnosis present

## 2023-09-11 DIAGNOSIS — Z720 Tobacco use: Secondary | ICD-10-CM | POA: Diagnosis not present

## 2023-09-11 DIAGNOSIS — D631 Anemia in chronic kidney disease: Secondary | ICD-10-CM | POA: Insufficient documentation

## 2023-09-11 DIAGNOSIS — N184 Chronic kidney disease, stage 4 (severe): Secondary | ICD-10-CM | POA: Diagnosis present

## 2023-09-11 DIAGNOSIS — Z885 Allergy status to narcotic agent status: Secondary | ICD-10-CM

## 2023-09-11 DIAGNOSIS — R0789 Other chest pain: Secondary | ICD-10-CM | POA: Diagnosis not present

## 2023-09-11 DIAGNOSIS — R918 Other nonspecific abnormal finding of lung field: Secondary | ICD-10-CM | POA: Diagnosis not present

## 2023-09-11 DIAGNOSIS — E860 Dehydration: Secondary | ICD-10-CM | POA: Diagnosis present

## 2023-09-11 DIAGNOSIS — Z8551 Personal history of malignant neoplasm of bladder: Secondary | ICD-10-CM | POA: Diagnosis not present

## 2023-09-11 DIAGNOSIS — Z880 Allergy status to penicillin: Secondary | ICD-10-CM

## 2023-09-11 DIAGNOSIS — J168 Pneumonia due to other specified infectious organisms: Secondary | ICD-10-CM | POA: Diagnosis not present

## 2023-09-11 DIAGNOSIS — K7460 Unspecified cirrhosis of liver: Secondary | ICD-10-CM | POA: Diagnosis present

## 2023-09-11 DIAGNOSIS — D500 Iron deficiency anemia secondary to blood loss (chronic): Secondary | ICD-10-CM | POA: Diagnosis present

## 2023-09-11 DIAGNOSIS — J449 Chronic obstructive pulmonary disease, unspecified: Secondary | ICD-10-CM | POA: Diagnosis present

## 2023-09-11 DIAGNOSIS — R590 Localized enlarged lymph nodes: Secondary | ICD-10-CM | POA: Diagnosis not present

## 2023-09-11 LAB — HEPATIC FUNCTION PANEL
ALT: 12 U/L (ref 0–44)
AST: 13 U/L — ABNORMAL LOW (ref 15–41)
Albumin: 3.8 g/dL (ref 3.5–5.0)
Alkaline Phosphatase: 106 U/L (ref 38–126)
Bilirubin, Direct: <0.1 mg/dL (ref 0.0–0.2)
Total Bilirubin: 0.2 mg/dL — ABNORMAL LOW (ref 0.3–1.2)
Total Protein: 7.5 g/dL (ref 6.5–8.1)

## 2023-09-11 LAB — URINALYSIS, ROUTINE W REFLEX MICROSCOPIC
Bilirubin Urine: NEGATIVE
Glucose, UA: NEGATIVE mg/dL
Ketones, ur: NEGATIVE mg/dL
Nitrite: POSITIVE — AB
Protein, ur: 30 mg/dL — AB
Specific Gravity, Urine: 1.008 (ref 1.005–1.030)
pH: 7 (ref 5.0–8.0)

## 2023-09-11 LAB — BASIC METABOLIC PANEL
Anion gap: 10 (ref 5–15)
BUN: 72 mg/dL — ABNORMAL HIGH (ref 8–23)
CO2: 20 mmol/L — ABNORMAL LOW (ref 22–32)
Calcium: 9.3 mg/dL (ref 8.9–10.3)
Chloride: 101 mmol/L (ref 98–111)
Creatinine, Ser: 3.79 mg/dL — ABNORMAL HIGH (ref 0.61–1.24)
GFR, Estimated: 16 mL/min — ABNORMAL LOW (ref >60)
Glucose, Bld: 120 mg/dL — ABNORMAL HIGH (ref 70–99)
Potassium: 5.9 mmol/L — ABNORMAL HIGH (ref 3.5–5.1)
Sodium: 131 mmol/L — ABNORMAL LOW (ref 135–145)

## 2023-09-11 LAB — TROPONIN I (HIGH SENSITIVITY)
Troponin I (High Sensitivity): 9 ng/L (ref <18)
Troponin I (High Sensitivity): 9 ng/L (ref <18)

## 2023-09-11 LAB — CBC
HCT: 37.4 % — ABNORMAL LOW (ref 39.0–52.0)
Hemoglobin: 11.6 g/dL — ABNORMAL LOW (ref 13.0–17.0)
MCH: 29.1 pg (ref 26.0–34.0)
MCHC: 31 g/dL (ref 30.0–36.0)
MCV: 94 fL (ref 80.0–100.0)
Platelets: 400 10*3/uL (ref 150–400)
RBC: 3.98 MIL/uL — ABNORMAL LOW (ref 4.22–5.81)
RDW: 14 % (ref 11.5–15.5)
WBC: 13.3 10*3/uL — ABNORMAL HIGH (ref 4.0–10.5)
nRBC: 0 % (ref 0.0–0.2)

## 2023-09-11 LAB — DIFFERENTIAL
Abs Immature Granulocytes: 0.07 10*3/uL (ref 0.00–0.07)
Basophils Absolute: 0 10*3/uL (ref 0.0–0.1)
Basophils Relative: 0 %
Eosinophils Absolute: 0.1 10*3/uL (ref 0.0–0.5)
Eosinophils Relative: 1 %
Immature Granulocytes: 1 %
Lymphocytes Relative: 12 %
Lymphs Abs: 1.5 10*3/uL (ref 0.7–4.0)
Monocytes Absolute: 0.8 10*3/uL (ref 0.1–1.0)
Monocytes Relative: 6 %
Neutro Abs: 10.3 10*3/uL — ABNORMAL HIGH (ref 1.7–7.7)
Neutrophils Relative %: 80 %

## 2023-09-11 LAB — RESP PANEL BY RT-PCR (RSV, FLU A&B, COVID)  RVPGX2
Influenza A by PCR: NEGATIVE
Influenza B by PCR: NEGATIVE
Resp Syncytial Virus by PCR: NEGATIVE
SARS Coronavirus 2 by RT PCR: NEGATIVE

## 2023-09-11 LAB — LACTIC ACID, PLASMA: Lactic Acid, Venous: 1.4 mmol/L (ref 0.5–1.9)

## 2023-09-11 LAB — LIPASE, BLOOD: Lipase: 24 U/L (ref 11–51)

## 2023-09-11 MED ORDER — PSYLLIUM 95 % PO PACK
1.0000 | PACK | Freq: Two times a day (BID) | ORAL | Status: DC
  Administered 2023-09-11 – 2023-09-13 (×4): 1 via ORAL
  Filled 2023-09-11 (×4): qty 1

## 2023-09-11 MED ORDER — DM-GUAIFENESIN ER 30-600 MG PO TB12
1.0000 | ORAL_TABLET | Freq: Two times a day (BID) | ORAL | Status: DC
  Administered 2023-09-11 – 2023-09-13 (×4): 1 via ORAL
  Filled 2023-09-11 (×4): qty 1

## 2023-09-11 MED ORDER — SODIUM ZIRCONIUM CYCLOSILICATE 10 G PO PACK
10.0000 g | PACK | Freq: Every day | ORAL | Status: DC
  Administered 2023-09-11: 10 g via ORAL
  Filled 2023-09-11: qty 1

## 2023-09-11 MED ORDER — NICOTINE 21 MG/24HR TD PT24
21.0000 mg | MEDICATED_PATCH | Freq: Every day | TRANSDERMAL | Status: DC
  Administered 2023-09-11 – 2023-09-13 (×3): 21 mg via TRANSDERMAL
  Filled 2023-09-11 (×3): qty 1

## 2023-09-11 MED ORDER — ACETAMINOPHEN 325 MG PO TABS
650.0000 mg | ORAL_TABLET | Freq: Four times a day (QID) | ORAL | Status: DC | PRN
  Administered 2023-09-12: 650 mg via ORAL
  Filled 2023-09-11: qty 2

## 2023-09-11 MED ORDER — IPRATROPIUM-ALBUTEROL 0.5-2.5 (3) MG/3ML IN SOLN
3.0000 mL | Freq: Four times a day (QID) | RESPIRATORY_TRACT | Status: DC
  Administered 2023-09-11: 3 mL via RESPIRATORY_TRACT
  Filled 2023-09-11: qty 3

## 2023-09-11 MED ORDER — PNEUMOCOCCAL 20-VAL CONJ VACC 0.5 ML IM SUSY
0.5000 mL | PREFILLED_SYRINGE | INTRAMUSCULAR | Status: AC
  Administered 2023-09-12: 0.5 mL via INTRAMUSCULAR
  Filled 2023-09-11: qty 0.5

## 2023-09-11 MED ORDER — SODIUM CHLORIDE 0.9% FLUSH
3.0000 mL | Freq: Two times a day (BID) | INTRAVENOUS | Status: DC
  Administered 2023-09-11 – 2023-09-13 (×3): 3 mL via INTRAVENOUS

## 2023-09-11 MED ORDER — MORPHINE SULFATE 15 MG PO TABS
15.0000 mg | ORAL_TABLET | Freq: Every morning | ORAL | Status: DC
  Administered 2023-09-12 – 2023-09-13 (×2): 15 mg via ORAL
  Filled 2023-09-11 (×3): qty 1

## 2023-09-11 MED ORDER — ONDANSETRON HCL 4 MG PO TABS: 4.0000 mg | ORAL_TABLET | Freq: Four times a day (QID) | ORAL | Status: DC | PRN

## 2023-09-11 MED ORDER — TRAZODONE HCL 50 MG PO TABS: 50.0000 mg | ORAL_TABLET | Freq: Every evening | ORAL | Status: DC | PRN

## 2023-09-11 MED ORDER — ATORVASTATIN CALCIUM 10 MG PO TABS
10.0000 mg | ORAL_TABLET | Freq: Every day | ORAL | Status: DC
  Administered 2023-09-12 – 2023-09-13 (×2): 10 mg via ORAL
  Filled 2023-09-11 (×2): qty 1

## 2023-09-11 MED ORDER — SODIUM CHLORIDE 0.9% FLUSH
3.0000 mL | Freq: Two times a day (BID) | INTRAVENOUS | Status: DC
  Administered 2023-09-11 – 2023-09-13 (×4): 3 mL via INTRAVENOUS

## 2023-09-11 MED ORDER — SODIUM ZIRCONIUM CYCLOSILICATE 10 G PO PACK
10.0000 g | PACK | Freq: Two times a day (BID) | ORAL | Status: DC
  Administered 2023-09-11 – 2023-09-13 (×4): 10 g via ORAL
  Filled 2023-09-11 (×4): qty 1

## 2023-09-11 MED ORDER — HEPARIN SODIUM (PORCINE) 5000 UNIT/ML IJ SOLN
5000.0000 [IU] | Freq: Three times a day (TID) | INTRAMUSCULAR | Status: DC
  Administered 2023-09-11 – 2023-09-13 (×5): 5000 [IU] via SUBCUTANEOUS
  Filled 2023-09-11 (×5): qty 1

## 2023-09-11 MED ORDER — ASPIRIN 81 MG PO TBEC
81.0000 mg | DELAYED_RELEASE_TABLET | Freq: Every day | ORAL | Status: DC
  Administered 2023-09-12 – 2023-09-13 (×2): 81 mg via ORAL
  Filled 2023-09-11 (×2): qty 1

## 2023-09-11 MED ORDER — SODIUM CHLORIDE 0.9 % IV SOLN
500.0000 mg | INTRAVENOUS | Status: DC
  Administered 2023-09-11 – 2023-09-12 (×2): 500 mg via INTRAVENOUS
  Filled 2023-09-11 (×3): qty 5

## 2023-09-11 MED ORDER — POLYETHYLENE GLYCOL 3350 17 G PO PACK: 17.0000 g | PACK | Freq: Every day | ORAL | Status: DC | PRN

## 2023-09-11 MED ORDER — SODIUM BICARBONATE 650 MG PO TABS
650.0000 mg | ORAL_TABLET | Freq: Three times a day (TID) | ORAL | Status: DC
  Administered 2023-09-11 – 2023-09-13 (×6): 650 mg via ORAL
  Filled 2023-09-11 (×6): qty 1

## 2023-09-11 MED ORDER — PANTOPRAZOLE SODIUM 40 MG PO TBEC
40.0000 mg | DELAYED_RELEASE_TABLET | Freq: Every day | ORAL | Status: DC
  Administered 2023-09-11 – 2023-09-13 (×3): 40 mg via ORAL
  Filled 2023-09-11 (×4): qty 1

## 2023-09-11 MED ORDER — IPRATROPIUM-ALBUTEROL 0.5-2.5 (3) MG/3ML IN SOLN
3.0000 mL | Freq: Three times a day (TID) | RESPIRATORY_TRACT | Status: DC
  Administered 2023-09-12 – 2023-09-13 (×4): 3 mL via RESPIRATORY_TRACT
  Filled 2023-09-11 (×4): qty 3

## 2023-09-11 MED ORDER — SODIUM CHLORIDE 0.9 % IV SOLN
1.0000 g | Freq: Once | INTRAVENOUS | Status: DC
  Filled 2023-09-11: qty 10

## 2023-09-11 MED ORDER — LACTATED RINGERS IV BOLUS
1000.0000 mL | Freq: Once | INTRAVENOUS | Status: AC
  Administered 2023-09-11: 1000 mL via INTRAVENOUS

## 2023-09-11 MED ORDER — LACTATED RINGERS IV SOLN: INTRAVENOUS | Status: DC

## 2023-09-11 MED ORDER — AMLODIPINE BESYLATE 5 MG PO TABS
2.5000 mg | ORAL_TABLET | Freq: Every day | ORAL | Status: DC
  Filled 2023-09-11: qty 1

## 2023-09-11 MED ORDER — INFLUENZA VAC A&B SURF ANT ADJ 0.5 ML IM SUSY
0.5000 mL | PREFILLED_SYRINGE | INTRAMUSCULAR | Status: AC
  Administered 2023-09-12: 0.5 mL via INTRAMUSCULAR
  Filled 2023-09-11: qty 0.5

## 2023-09-11 MED ORDER — VITAMIN D 25 MCG (1000 UNIT) PO TABS
1000.0000 [IU] | ORAL_TABLET | Freq: Every day | ORAL | Status: DC
  Administered 2023-09-12 – 2023-09-13 (×2): 1000 [IU] via ORAL
  Filled 2023-09-11 (×2): qty 1

## 2023-09-11 MED ORDER — AZITHROMYCIN 250 MG PO TABS
500.0000 mg | ORAL_TABLET | Freq: Once | ORAL | Status: DC
  Filled 2023-09-11: qty 2

## 2023-09-11 MED ORDER — SODIUM CHLORIDE 0.9 % IV BOLUS
500.0000 mL | Freq: Once | INTRAVENOUS | Status: AC
  Administered 2023-09-11: 500 mL via INTRAVENOUS

## 2023-09-11 MED ORDER — ALBUTEROL SULFATE (2.5 MG/3ML) 0.083% IN NEBU: 2.5000 mg | INHALATION_SOLUTION | RESPIRATORY_TRACT | Status: DC | PRN

## 2023-09-11 MED ORDER — ACETAMINOPHEN 650 MG RE SUPP: 650.0000 mg | Freq: Four times a day (QID) | RECTAL | Status: DC | PRN

## 2023-09-11 MED ORDER — OXYCODONE HCL 5 MG PO TABS
10.0000 mg | ORAL_TABLET | ORAL | Status: DC | PRN
  Administered 2023-09-11 – 2023-09-12 (×2): 10 mg via ORAL
  Filled 2023-09-11 (×2): qty 2

## 2023-09-11 MED ORDER — SODIUM CHLORIDE 0.9% FLUSH: 3.0000 mL | INTRAVENOUS | Status: DC | PRN

## 2023-09-11 MED ORDER — ACETAMINOPHEN 500 MG PO TABS: 500.0000 mg | ORAL_TABLET | Freq: Four times a day (QID) | ORAL | Status: DC | PRN

## 2023-09-11 MED ORDER — SODIUM CHLORIDE 0.9 % IV SOLN: INTRAVENOUS | Status: DC | PRN

## 2023-09-11 MED ORDER — SODIUM CHLORIDE 0.9 % IV SOLN: INTRAVENOUS | Status: DC

## 2023-09-11 MED ORDER — SODIUM BICARBONATE 650 MG PO TABS: 650.0000 mg | ORAL_TABLET | Freq: Two times a day (BID) | ORAL | Status: DC

## 2023-09-11 MED ORDER — ONDANSETRON HCL 4 MG/2ML IJ SOLN: 4.0000 mg | Freq: Four times a day (QID) | INTRAMUSCULAR | Status: DC | PRN

## 2023-09-11 MED ORDER — SODIUM CHLORIDE 0.9 % IV SOLN
2.0000 g | INTRAVENOUS | Status: DC
  Administered 2023-09-11 – 2023-09-12 (×2): 2 g via INTRAVENOUS
  Filled 2023-09-11 (×2): qty 20

## 2023-09-11 MED ORDER — BISACODYL 10 MG RE SUPP: 10.0000 mg | Freq: Every day | RECTAL | Status: DC | PRN

## 2023-09-11 NOTE — Plan of Care (Signed)
  Problem: Education: Goal: Knowledge of General Education information will improve Description: Including pain rating scale, medication(s)/side effects and non-pharmacologic comfort measures Outcome: Progressing   Problem: Activity: Goal: Risk for activity intolerance will decrease Outcome: Progressing   Problem: Nutrition: Goal: Adequate nutrition will be maintained Outcome: Progressing   Problem: Elimination: Goal: Will not experience complications related to urinary retention Outcome: Progressing   Problem: Safety: Goal: Ability to remain free from injury will improve Outcome: Progressing

## 2023-09-11 NOTE — ED Provider Notes (Signed)
[place] EMERGENCY DEPARTMENT AT [facility] Provider Note   CSN: [id number] Arrival date & time: 09/11/23  0900     History  Chief Complaint  Patient presents with   Chest Pain    Jeffery Ponce is a 77 y.o. male.  Presents ER for about 2 days of lower chest and diffuse abdominal pain.  States he had no appetite and feeling generally unwell.  Admits to mild shortness of breath.  He has had decreased appetite and only thing he has eaten in the past 2 days has been to oatmeal cakes, has had nothing to eat today but did take his daily medications including blood pressure medication.  Still has nausea but no vomiting.  States he had loose stool this morning but this is been going on for 4 months.  Patient has history of bladder cancer, suprapubic catheter, CKD, chronic anemia on iron infusion followed by hematology, liver cirrhosis.  Denies history of CHF.  No lower extremity swelling.   Chest Pain      Home Medications Prior to Admission medications   Medication Sig Start Date End Date Taking? Authorizing Provider  acetaminophen (TYLENOL) 500 MG tablet Take 500 mg by mouth every 6 (six) hours as needed for moderate pain.   Yes [provider]  amLODipine (NORVASC) 5 MG tablet 1 tablet Orally Once a day for 90 days   Yes [provider]  atorvastatin (LIPITOR) 10 MG tablet Take 1 tablet (10 mg total) by mouth daily. 10/19/18 09/11/23 Yes Dew, Marlow Baars, MD  cholecalciferol (VITAMIN D) 25 MCG (1000 UT) tablet Take 1,000 Units by mouth daily.  10/02/18  Yes [provider]  doxycycline (VIBRAMYCIN) 100 MG capsule Take 100 mg by mouth 2 (two) times daily. 09/08/23  Yes [provider]  FARXIGA 5 MG TABS tablet 1 tablet Orally Once a day 11/20/22  Yes [provider]  GNP ASPIRIN LOW DOSE 81 MG EC tablet TAKE 1 TABLET BY MOUTH ONCE DAILY. 07/23/19  Yes Dew, Marlow Baars, MD  lisinopril (ZESTRIL) 2.5 MG tablet 1 tablet Orally Once a day for 90  days   Yes [provider]  morphine (MSIR) 15 MG tablet 1 tablet as needed Orally twice a day   Yes [provider]  Oxycodone HCl 10 MG TABS 1 tablet as needed Orally every 4 hours as needed   Yes [provider]  sodium bicarbonate 650 MG tablet Take 650 mg by mouth 2 (two) times daily.   Yes [provider]  sodium zirconium cyclosilicate (LOKELMA) 10 g PACK packet Take 1 packet by mouth daily.   Yes [provider]      Allergies    Penicillins, Ciprofloxacin, Statins, Ambien [zolpidem tartrate], and Oxycodone    Review of Systems   Review of Systems  Cardiovascular:  Positive for chest pain.    Physical Exam Updated Vital Signs BP (!) 90/44   Pulse 71   Temp 98.1 F (36.7 C)   Resp 20   Ht 5\' 10"  (1.778 m)   Wt 29.3 kg   SpO2 96%   BMI 9.27 kg/m  Physical Exam Vitals and nursing note reviewed.  Constitutional:      General: He is not in acute distress.    Appearance: He is well-developed.  HENT:     Head: Normocephalic and atraumatic.  Eyes:     Extraocular Movements: Extraocular movements intact.     Conjunctiva/sclera: Conjunctivae normal.     Pupils:  Pupils are equal, round, and reactive to light.  Cardiovascular:     Rate and Rhythm: Normal rate and regular rhythm.     Heart sounds: No murmur heard. Pulmonary:     Effort: Pulmonary effort is normal. No respiratory distress.     Breath sounds: Normal breath sounds.  Abdominal:     Palpations: Abdomen is soft.     Tenderness: There is abdominal tenderness.     Comments: Mild diffuse tenderness with no rebound guarding or rigidity  Musculoskeletal:        General: No swelling. Normal range of motion.     Cervical back: Normal range of motion and neck supple.     Right lower leg: No tenderness. No edema.     Left lower leg: No tenderness. No edema.  Skin:    General: Skin is warm and dry.     Capillary Refill: Capillary refill takes less than 2 seconds.   Neurological:     General: No focal deficit present.     Mental Status: He is alert and oriented to person, place, and time.  Psychiatric:        Mood and Affect: Mood normal.     ED Results / Procedures / Treatments   Labs (all labs ordered are listed, but only abnormal results are displayed) Labs Reviewed  BASIC METABOLIC PANEL - Abnormal; Notable for the following components:      Result Value   Sodium 131 (*)    Potassium 5.9 (*)    CO2 20 (*)    Glucose, Bld 120 (*)    BUN 72 (*)    Creatinine, Ser 3.79 (*)    GFR, Estimated 16 (*)    All other components within normal limits  CBC - Abnormal; Notable for the following components:   WBC 13.3 (*)    RBC 3.98 (*)    Hemoglobin 11.6 (*)    HCT 37.4 (*)    All other components within normal limits  HEPATIC FUNCTION PANEL - Abnormal; Notable for the following components:   AST 13 (*)    Total Bilirubin 0.2 (*)    All other components within normal limits  DIFFERENTIAL - Abnormal; Notable for the following components:   Neutro Abs 10.3 (*)    All other components within normal limits  URINALYSIS, ROUTINE W REFLEX MICROSCOPIC - Abnormal; Notable for the following components:   APPearance HAZY (*)    Hgb urine dipstick SMALL (*)    Protein, ur 30 (*)    Nitrite POSITIVE (*)    Leukocytes,Ua SMALL (*)    Bacteria, UA FEW (*)    All other components within normal limits  CULTURE, BLOOD (ROUTINE X 2)  CULTURE, BLOOD (ROUTINE X 2)  RESP PANEL BY RT-PCR (RSV, FLU A&B, COVID)  RVPGX2  URINE CULTURE  LIPASE, BLOOD  LACTIC ACID, PLASMA  CBC  RENAL FUNCTION PANEL  TROPONIN I (HIGH SENSITIVITY)  TROPONIN I (HIGH SENSITIVITY)    EKG EKG Interpretation Date/Time:  Thursday September 11 2023 09:16:37 EDT Ventricular Rate:  97 PR Interval:  198 QRS Duration:  84 QT Interval:  340 QTC Calculation: 431 R Axis:   105  Text Interpretation: Normal sinus rhythm Right atrial enlargement Rightward axis Pulmonary disease pattern  Abnormal ECG When compared with ECG of 12-Nov-2018 13:25, PREVIOUS ECG IS PRESENT Confirmed by Vanetta Mulders [phone]) on 09/11/2023 10:07:51 AM  Radiology CT CHEST ABDOMEN PELVIS WO CONTRAST  Result Date: 09/11/2023 CLINICAL DATA:  Acute abdominal pain  and chest pain radiating down over the last few days. Shortness of breath with some nausea EXAM: CT CHEST, ABDOMEN AND PELVIS WITHOUT CONTRAST TECHNIQUE: Multidetector CT imaging of the chest, abdomen and pelvis was performed following the standard protocol without IV contrast. RADIATION DOSE REDUCTION: This exam was performed according to the departmental dose-optimization program which includes automated exposure control, adjustment of the mA and/or kV according to patient size and/or use of iterative reconstruction technique. COMPARISON:  Chest x-ray earlier 09/11/2023. Chest CT 04/24/2023. Multiple prior ultrasounds and CT scans of the abdomen going back several years as well. FINDINGS: CT CHEST FINDINGS Cardiovascular: On this non IV contrast exam, heart is nonenlarged. No pericardial effusion. Coronary artery calcifications are seen. The thoracic aorta has a normal course and caliber with scattered vascular calcifications. Mediastinum/Nodes: Diffuse wall thickening of the esophagus. Please correlate with any symptoms. The thyroid gland is unremarkable. On this non IV contrast exam there is no specific abnormal lymph node enlargement identified in the axillary region, hilum or mediastinum. Lungs/Pleura: Tiny left effusion identified, unchanged from previous. There is chronic lung changes identified with interstitial septal thickening as well as reticular changes and centrilobular emphysematous change. There is breathing motion particularly along the lower lung zones limiting evaluation. Since the prior CT scan there is development of several spiculated nodular areas in the lungs, right-greater-than-left. Example in the middle lobe on series 3, image 83  measures 8 mm. Focus along the right upper lobe anteromedial on series 3, image 66 measures 7 mm. Slightly more confluence area along the inferior margin of the right upper lobe near the minor fissure measures 2.9 by 0.9 cm. These are has a differential. Example in the left lung of the apex on series 3, image 28 measures 5 mm. Musculoskeletal: Curvature of the spine. Osteopenia. Scattered degenerative changes. CT ABDOMEN PELVIS FINDINGS Hepatobiliary: On this non IV contrast exam, grossly preserved hepatic parenchyma. Tiny cystic areas seen in segment 4 stable. No specific imaging follow-up. Previous cholecystectomy. Pancreas: Global pancreatic atrophy. Spleen: Normal in size without focal abnormality. Adrenals/Urinary Tract: Stable thickening of the adrenal glands. Moderate atrophy of the kidneys with nonspecific perinephric stranding. The collecting systems are nondilated. There has been previous cystectomy with ileal loop conduit along the right anterior abdomen with an ostomy. Stomach/Bowel: On this non oral contrast exam large bowel has a normal course and caliber. Left-sided colonic diverticula identified in the sigmoid region. Scattered stool. Appendix not well seen in the right lower quadrant but no pericecal stranding or fluid. There is motion limiting evaluation. The stomach is distended with fluid and debris and air. Small bowel is nondilated Vascular/Lymphatic: Diffuse vascular calcifications. There is severe calcifications along the iliac vessels. Known prior stent placement. Please correlate with symptomatology. Evaluation of the stents in the vascular stenosis is limited without the advantage of IV contrast. Normal caliber IVC. There are several upper abdominal prominent lymph nodes in the retroperitoneum which are similar to previous. Example near the celiac region on series 2, image 61 has short axis of 12 mm and previously 12 mm in August 2022. No new lymph node enlargement. Reproductive: Absence  of the prostate. Other: Anasarca.  No free air or free fluid.  Motion. Musculoskeletal: Streak artifact related to the patient's dynamic right hip screw. Old healed pubic bone fractures. Global osteopenia with scattered degenerative changes. Particular degenerative changes of the spine at L2-3 with stenosis. Small sclerotic focus identified along the left iliac bone on series 2, image 94 is stable. IMPRESSION: Significant  increase in reticular changes as well as scattered spiculated nodules in the lungs, right-greater-than-left. This has differential including atypical infection versus neoplasm. Please correlate with symptoms and short follow-up versus PET-CT scan. Stable left pleural effusion. Emphysematous lung changes. Diffuse wall thickening of the esophagus. Please correlate with symptoms and further workup when appropriate. 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 Atrophy of the kidneys. No obstructing stone. Sigmoid colon diverticula. No bowel obstruction, free air or free fluid. Stable borderline and mildly enlarged upper abdominal lymph nodes. Extensive vascular calcifications with stents. Electronically Signed   By: Karen Kays M.D.   On: 09/11/2023 13:27   DG Chest Portable 1 View  Result Date: 09/11/2023 CLINICAL DATA:  Chest pain EXAM: PORTABLE CHEST 1 VIEW COMPARISON:  CT 04/24/2023 FINDINGS: Hyperinflation. Chronic interstitial lung changes. Tiny left effusion or pleural thickening. Adjacent scar or atelectasis. No pneumothorax or consolidation. Normal cardiopericardial silhouette. Film is rotated to the left. Surgical clips in the upper abdomen. IMPRESSION: Hyperinflation with chronic lung changes. Small left effusion or pleural thickening with adjacent scar or atelectasis. Electronically Signed   By: Karen Kays M.D.   On: 09/11/2023 10:07    Procedures Procedures    Medications Ordered in ED Medications  pneumococcal 20-valent conjugate vaccine (PREVNAR 20)  injection 0.5 mL (has no administration in time range)  influenza vaccine adjuvanted (FLUAD) injection 0.5 mL (has no administration in time range)  morphine (MSIR) tablet 15 mg (has no administration in time range)  oxyCODONE (Oxy IR/ROXICODONE) immediate release tablet 10 mg (10 mg Oral Given 09/11/23 1841)  amLODipine (NORVASC) tablet 2.5 mg (has no administration in time range)  atorvastatin (LIPITOR) tablet 10 mg (has no administration in time range)  cholecalciferol (VITAMIN D3) 25 MCG (1000 UNIT) tablet 1,000 Units (has no administration in time range)  sodium chloride flush (NS) 0.9 % injection 3 mL (has no administration in time range)  sodium chloride flush (NS) 0.9 % injection 3 mL (has no administration in time range)  sodium chloride flush (NS) 0.9 % injection 3 mL (has no administration in time range)  0.9 %  sodium chloride infusion (has no administration in time range)  acetaminophen (TYLENOL) tablet 650 mg (has no administration in time range)    Or  acetaminophen (TYLENOL) suppository 650 mg (has no administration in time range)  traZODone (DESYREL) tablet 50 mg (has no administration in time range)  ondansetron (ZOFRAN) tablet 4 mg (has no administration in time range)    Or  ondansetron (ZOFRAN) injection 4 mg (has no administration in time range)  heparin injection 5,000 Units (has no administration in time range)  azithromycin (ZITHROMAX) 500 mg in sodium chloride 0.9 % 250 mL IVPB (500 mg Intravenous New Bag/Given 09/11/23 1839)  cefTRIAXone (ROCEPHIN) 2 g in sodium chloride 0.9 % 100 mL IVPB (2 g Intravenous New Bag/Given 09/11/23 1657)  dextromethorphan-guaiFENesin (MUCINEX DM) 30-600 MG per 12 hr tablet 1 tablet (has no administration in time range)  ipratropium-albuterol (DUONEB) 0.5-2.5 (3) MG/3ML nebulizer solution 3 mL (has no administration in time range)  albuterol (PROVENTIL) (2.5 MG/3ML) 0.083% nebulizer solution 2.5 mg (has no administration in time range)   psyllium (HYDROCIL/METAMUCIL) 1 packet (1 packet Oral Given 09/11/23 1836)  0.9 %  sodium chloride infusion ( Intravenous New Bag/Given 09/11/23 1654)  nicotine (NICODERM CQ - dosed in mg/24 hours) patch 21 mg (21 mg Transdermal Patch Applied 09/11/23 1842)  sodium zirconium cyclosilicate (LOKELMA) packet 10 g (10 g Oral Given 09/11/23 1848)  sodium bicarbonate tablet 650 mg (650 mg Oral Given 09/11/23 1835)  pantoprazole (PROTONIX) EC tablet 40 mg (40 mg Oral Given 09/11/23 1835)  aspirin EC tablet 81 mg (has no administration in time range)  lactated ringers bolus 1,000 mL (0 mLs Intravenous Stopped 09/11/23 1130)  lactated ringers bolus 1,000 mL (0 mLs Intravenous Stopped 09/11/23 1310)    ED Course/ Medical Decision Making/ A&P Clinical Course as of 09/11/23 1936  Thu Sep 11, 2023  1009 Presents ER today for chest and abdominal pain ongoing for couple days, reports 1 episode of loose stool this morning but states has been going on for months, has not had an appetite in the past 2 days, though that he has eaten the past 2 days has been to oatmeal cakes, has not had anything to eat or drink today but did take all of his medications including blood pressure medication.  Noted to be hypotensive, appears volume depleted will give fluid bolus, given his chest and abdominal pain have ordered a CT angio rule out aortic dissection.  Also ordered lactic acid and blood cultures, and has mild leukocytosis, sepsis also on differential for his hypotension.  Dr. Deretha Emory evaluating at this time as well [CB]  1041 Has AKI, creatinine is over 3, mild hyperkalemia.  Getting IV fluids for the this as well as his hypotension.  Unable to get contrasted CT but given this feels is much more likely hypertension from volume depletion then aortic dissection specially given is going on for couple of days and patient is not having severe pain.  Will get a noncontrasted study.  There is potential for infection, patient notes his  urostomy bag had very thick liquid and at this morning, he emptied into a MiraLAX bottle but he is unsure if the bag will fill up again [CB]    Clinical Course User Index [CB] Ma Rings, [name]                                 Medical Decision Making This patient presents to the ED for concern of chest pain, abdominal pain, dizziness, this involves an extensive number of treatment options, and is a complaint that carries with it a high risk of complications and morbidity.  The differential diagnosis includes ACS, sepsis, pneumonia, gastroenteritis, dehydration, AKI, dissection, other   Co morbidities that complicate the patient evaluation :   Bladder cancer, CKD, anemia   Additional history obtained:  Additional history obtained from EMR External records from outside source obtained and reviewed including notes      Imaging Studies ordered:  I ordered imaging studies including CTA chest abdomen pelvis which shows spiculated lesions suspicious for atypical pneumonia versus malignancy I independently visualized and interpreted imaging within scope of identifying emergent findings  I agree with the radiologist interpretation   Cardiac Monitoring: / EKG:  The patient was maintained on a cardiac monitor.  I personally viewed and interpreted the cardiac monitored which showed an underlying rhythm of: Sinus rhythm   Consultations Obtained:  I requested consultation with the hospitalist,  and discussed lab and imaging findings as well as pertinent plan - they recommend: admission   Problem List / ED Course / Critical interventions / Medication management  Presented with hypotension, feeling generally unwell, he has history of bladder cancer with a urostomy bag, was feeling dizzy.  Feeling much better after IV fluids and blood pressures improved, I did have  a leukocytosis and AKI, CT chest and pelvis suspicion for possible pneumonia, treated for CAP, urinalysis still pending at  the time of admission though Rocephin would cover for UTI as well.  Discussed with hospitalist for admission I ordered medication including lactated Ringer's for hypotension and AKI Reevaluation of the patient after these medicines showed that the patient improved I have reviewed the patients home medicines and have made adjustments as needed     Amount and/or Complexity of Data Reviewed External Data Reviewed: labs and notes. Labs: ordered. Decision-making details documented in ED Course.    Details: Note potassium slightly elevated, not associated with EKG changes, patient getting IV fluids, do not feel he needs calcium or insulin or other medications at this time Radiology: ordered. Decision-making details documented in ED Course.  Risk Decision regarding hospitalization.           Final Clinical Impression(s) / ED Diagnoses Final diagnoses:  AKI (acute kidney injury) (HCC)  Pneumonia due to infectious organism, unspecified laterality, unspecified part of lung    Rx / DC Orders ED Discharge Orders     None         Ma Rings, [name] 09/11/23 1937    Vanetta Mulders, MD 09/16/23 [phone]

## 2023-09-11 NOTE — Progress Notes (Addendum)
Tobacco abuse --  -Patient smokes about a pack a day -Patient has more than 40 pack years of smoking Smoking cessation counseling for 4 minutes today, consider nicotine patch I have discussed tobacco cessation with the patient.  I have counseled the patient regarding the negative impacts of continued tobacco use including but not limited to lung cancer, COPD, and cardiovascular disease.  I have discussed alternatives to tobacco and modalities that may help facilitate tobacco cessation including but not limited to biofeedback, hypnosis, and medications.  Total time spent with tobacco counseling was 4 minutes. -Nicotine patch ordered Shon Hale, MD

## 2023-09-11 NOTE — ED Notes (Signed)
After reconnecting him up for vitals, his blood pressure was 75/49 after rechecking it multiple times, so looked for Dr. Deretha Emory and sent a message to him and [place].
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
## 2023-09-11 NOTE — ED Triage Notes (Signed)
Pt c/o chest pain that radiates down to his belly for the last coupld of days; pt states he feels sob and has some nausea

## 2023-09-11 NOTE — H&P (Signed)
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 info  [facility] - Office  [phone]

## 2023-09-12 DIAGNOSIS — N179 Acute kidney failure, unspecified: Secondary | ICD-10-CM | POA: Diagnosis not present

## 2023-09-12 DIAGNOSIS — Z720 Tobacco use: Secondary | ICD-10-CM | POA: Diagnosis not present

## 2023-09-12 DIAGNOSIS — C679 Malignant neoplasm of bladder, unspecified: Secondary | ICD-10-CM

## 2023-09-12 DIAGNOSIS — D500 Iron deficiency anemia secondary to blood loss (chronic): Secondary | ICD-10-CM | POA: Diagnosis not present

## 2023-09-12 DIAGNOSIS — J189 Pneumonia, unspecified organism: Secondary | ICD-10-CM | POA: Diagnosis not present

## 2023-09-12 LAB — CBC
HCT: 28.6 % — ABNORMAL LOW (ref 39.0–52.0)
Hemoglobin: 8.8 g/dL — ABNORMAL LOW (ref 13.0–17.0)
MCH: 29 pg (ref 26.0–34.0)
MCHC: 30.8 g/dL (ref 30.0–36.0)
MCV: 94.4 fL (ref 80.0–100.0)
Platelets: 246 10*3/uL (ref 150–400)
RBC: 3.03 MIL/uL — ABNORMAL LOW (ref 4.22–5.81)
RDW: 14.1 % (ref 11.5–15.5)
WBC: 8.5 10*3/uL (ref 4.0–10.5)
nRBC: 0 % (ref 0.0–0.2)

## 2023-09-12 LAB — URINE CULTURE

## 2023-09-12 LAB — RENAL FUNCTION PANEL
Albumin: 2.6 g/dL — ABNORMAL LOW (ref 3.5–5.0)
Anion gap: 4 — ABNORMAL LOW (ref 5–15)
BUN: 58 mg/dL — ABNORMAL HIGH (ref 8–23)
CO2: 20 mmol/L — ABNORMAL LOW (ref 22–32)
Calcium: 8 mg/dL — ABNORMAL LOW (ref 8.9–10.3)
Chloride: 111 mmol/L (ref 98–111)
Creatinine, Ser: 2.53 mg/dL — ABNORMAL HIGH (ref 0.61–1.24)
GFR, Estimated: 25 mL/min — ABNORMAL LOW (ref >60)
Glucose, Bld: 89 mg/dL (ref 70–99)
Phosphorus: 4.1 mg/dL (ref 2.5–4.6)
Potassium: 5.2 mmol/L — ABNORMAL HIGH (ref 3.5–5.1)
Sodium: 135 mmol/L (ref 135–145)

## 2023-09-12 MED ORDER — LACTATED RINGERS IV SOLN: INTRAVENOUS | Status: AC

## 2023-09-12 NOTE — Care Management Important Message (Signed)
Important Message  Patient Details  Name: Jeffery Ponce MRN: [id number] Date of Birth: 1946/03/27   Important Message Given:  N/A - LOS <3 / Initial given by admissions     Corey Harold 09/12/2023, 3:49 PM

## 2023-09-12 NOTE — TOC Initial Note (Signed)
Transition of Care [facility]) - Initial/Assessment Note    Patient Details  Name: Jeffery Ponce MRN: [id number] Date of Birth: 11-Sep-1946  Transition of Care [facility]) CM/SW Contact:    Erin Sons, LCSW Phone Number: 09/12/2023, 12:58 PM  Clinical Narrative:                  CSW met with pt and pt spouse bedside to complete high-risk readmission assessment. Pt lives at home with his spouse. He drives himself to appointments and to get groceries. Pt is able to manage his medications. He does not use any type of DME. No needs identified at this time. Please reach out to [facility] if any needs arise.   Expected Discharge Plan: Home/Self Care Barriers to Discharge: Continued Medical Work up   Expected Discharge Plan and Services       Living arrangements for the past 2 months: Single Family Home                                      Prior Living Arrangements/Services Living arrangements for the past 2 months: Single Family Home Lives with:: Spouse Patient language and need for interpreter reviewed:: Yes        Need for Family Participation in Patient Care: No (Comment) Care giver support system in place?: Yes (comment)   Criminal Activity/Legal Involvement Pertinent to Current Situation/Hospitalization: No - Comment as needed  Activities of Daily Living   ADL Screening (condition at time of admission) Independently performs ADLs?: Yes (appropriate for developmental age) Is the patient deaf or have difficulty hearing?: No Does the patient have difficulty seeing, even when wearing glasses/contacts?: No Does the patient have difficulty concentrating, remembering, or making decisions?: No  Permission Sought/Granted                  Emotional Assessment Appearance:: Appears stated age Attitude/Demeanor/Rapport: Engaged Affect (typically observed): Accepting Orientation: : Oriented to Self, Oriented to Place, Oriented to  Time, Oriented to Situation Alcohol / Substance  Use: Not Applicable Psych Involvement: No (comment)  Admission diagnosis:  Pneumonia [J18.9] AKI (acute kidney injury) (HCC) [N17.9] Pneumonia due to infectious organism, unspecified laterality, unspecified part of lung [J18.9] Patient Active Problem List   Diagnosis Date Noted   Pneumonia 09/11/2023   COPD (chronic obstructive pulmonary disease) (HCC) 09/11/2023   CKD (chronic kidney disease), stage IV (HCC) 09/11/2023   Anemia in stage 4 chronic kidney disease (HCC) 09/08/2023   Loss of weight 07/21/2023   Decreased appetite 07/21/2023   Liver cirrhosis (HCC) 07/21/2023   Liver disease, unspecified 01/20/2023   Constipation 06/04/2022   Abnormal CT of liver 01/29/2021   Iron deficiency anemia due to chronic blood loss 12/21/2019   Absolute anemia 11/26/2019   Heme + stool 10/05/2019   Atherosclerosis of native arteries of extremity with intermittent claudication (HCC) 11/27/2018   Acute blood loss anemia    GI (gastrointestinal bleed) 11/13/2018   Hematochezia    GIB (gastrointestinal bleeding) 11/12/2018   Severe anemia    UGI bleed    Gastroesophageal reflux disease    Antiplatelet or antithrombotic long-term use    NSAID long-term use    Lower abdominal pain    AKI (acute kidney injury) (HCC)    PAD (peripheral artery disease) (HCC) 08/25/2018   Chronic renal disease, stage 3, moderately decreased glomerular filtration rate (GFR) between 30-59 mL/min/1.73 square meter (HCC) 11/28/2017  History of ileal conduit 07/30/2017   Bladder cancer (HCC) 07/29/2017   Tobacco abuse 04/15/2017   Other insomnia 04/15/2017   [name] 04/15/2017   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 [place] [address] Phone: [phone] Fax: [phone]     Social Determinants of Health (SDOH) Social History: SDOH Screenings   Food Insecurity: No  Food Insecurity (09/11/2023)  Housing: Low Risk  (09/11/2023)  Transportation Needs: No Transportation Needs (09/11/2023)  Utilities: Not At Risk (09/11/2023)  Alcohol Screen: Low Risk  (01/15/2021)  Depression (PHQ2-9): Medium Risk (01/15/2021)  Financial Resource Strain: Medium Risk (01/15/2021)  Physical Activity: Sufficiently Active (01/15/2021)  Social Connections: Moderately Integrated (01/15/2021)  Stress: Stress Concern Present (01/15/2021)  Tobacco Use: High Risk (09/11/2023)   SDOH Interventions:     Readmission Risk Interventions     No data to display

## 2023-09-12 NOTE — Progress Notes (Signed)
PROGRESS NOTE  Jeffery Ponce, is a 77 y.o. male, DOB - 09-12-1946, WUJ:[id number]  Admit date - 09/11/2023   Admitting Physician Gavon Majano Mariea Clonts, MD  Outpatient Primary MD for the patient is Alvina Filbert, MD  LOS - 1  Chief Complaint  Patient presents with   Chest Pain        Brief Narrative:  77 y.o. male with past medical history relevant for chronic iron deficiency anemia and anemia of CKD, CKD stage IV, history of bladder cancer and s/p TURBT for T2 a N0 urothelial cancer on 11/19/2016, followed by status post cystoprostatectomy and ileal conduit on 07/30/2017, ongoing tobacco abuse, PVD with prior angioplasty and stenting and liver disease admitted with AKI and hyperkalemia on 09/11/2023 with concerns for community-acquired pneumonia    -Assessment and Plan: 1)AKI----acute kidney injury on CKD stage - IV--suspect due to dehydration in the setting of diarrhea and vomiting and poor oral intake -  creatinine on admission=3.79 , -Creatinine was 2.39 on 05/02/2023 -Creatinine currently trending down with IV fluids - renally adjust medications, avoid nephrotoxic agents / dehydration  / hypotension     2)Hyperkalemia--potassium is up to 5.9.Marland KitchenMarland Kitchen Patient has a history of hyperkalemia in the past -No significant EKG changes -Bicarb 3 times daily -Restarted Lokelma -Patient is down to 5.2 from 5.9   3) hyponatremia--sodium was down to 131 suspect due to dehydration and GI losses -Bicarb is low suspect metabolic acidosis is due to combination of AKI and dehydration -Sodium normalized with hydration   4)Presumed Pneumonia/CAP----Rocephin/azithromycin as ordered, Mucinex and bronchodilators as ordered -Cannot rule out concomitant UTI patient has ileal conduit---UA suggestive of UTI but not conclusive urine culture pending --Rocephin as above -WBC 13.3>>8.5   5) transient hypotension--- most likely due to dehydration/volume depletion in the setting of GI losses and poor oral intake,  rather than frank sepsis -BP improved with IV fluids -Continue IV fluids   6) tobacco abuse/COPD--continue bronchodilators C/n nicotine patch as ordered  7)chronic iron deficiency anemia and anemia of CKD stage IV, -CBC dropping with hydration/hemodilution -Outpatient IV iron prior to discharge -No obvious bleeding at this time, continue to monitor   8) esophagitis--- patient with recurrent emesis, please see CT chest report -Protonix as ordered   9) history of bladder cancer and 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 Ellin Saba oncologist   10)PVD with prior angioplasty and stenting--aspirin and atorvastatin as ordered -Smoking cessation advised as above #6   11)IBS-reports episodes of alternating constipation and diarrhea -Fiber supplement advised   12)dyspnea and atypical chest discomfort over the last 2 to 3 days -Emesis was mostly scant amount of clear gastric contents without blood or bile with mostly dry heaving -in the ED EKG is normal sinus rhythm without acute findings -Troponin 9, repeat troponin still 9 -Suspect chest discomfort is secondary to esophagitis as above #8 in the setting of recurrent emesis   Status is: Inpatient   Remains inpatient appropriate because:    Dispo: The patient is from: Home              Anticipated d/c is to: Home  Code Status :  -  Code Status: Full Code   Family Communication:    NA (patient is alert, awake and coherent)   DVT Prophylaxis  :   - SCDs   heparin injection 5,000 Units Start: 09/11/23 2200 SCDs Start: 09/11/23 1626 Place TED hose  Start: 09/11/23 1626   Lab Results  Component Value Date   PLT 246 09/12/2023    Inpatient Medications  Scheduled Meds:  aspirin EC  81 mg Oral Q breakfast   atorvastatin  10 mg Oral Daily   cholecalciferol  1,000 Units Oral Daily   dextromethorphan-guaiFENesin  1 tablet  Oral BID   heparin  5,000 Units Subcutaneous Q8H   ipratropium-albuterol  3 mL Nebulization TID   morphine  15 mg Oral q morning   nicotine  21 mg Transdermal Daily   pantoprazole  40 mg Oral Daily   psyllium  1 packet Oral BID   sodium bicarbonate  650 mg Oral TID   sodium chloride flush  3 mL Intravenous Q12H   sodium chloride flush  3 mL Intravenous Q12H   sodium zirconium cyclosilicate  10 g Oral BID   Continuous Infusions:  sodium chloride     azithromycin 500 mg (09/12/23 1627)   cefTRIAXone (ROCEPHIN)  IV 2 g (09/12/23 1626)   lactated ringers     PRN Meds:.sodium chloride, acetaminophen **OR** acetaminophen, albuterol, ondansetron **OR** ondansetron (ZOFRAN) IV, oxyCODONE, sodium chloride flush, traZODone   Anti-infectives (From admission, onward)    Start     Dose/Rate Route Frequency Ordered Stop   09/11/23 1700  azithromycin (ZITHROMAX) 500 mg in sodium chloride 0.9 % 250 mL IVPB        500 mg 250 mL/hr over 60 Minutes Intravenous Every 24 hours 09/11/23 1627 09/16/23 1659   09/11/23 1700  cefTRIAXone (ROCEPHIN) 2 g in sodium chloride 0.9 % 100 mL IVPB        2 g 200 mL/hr over 30 Minutes Intravenous Every 24 hours 09/11/23 1627 09/16/23 1659   09/11/23 1345  cefTRIAXone (ROCEPHIN) 1 g in sodium chloride 0.9 % 100 mL IVPB  Status:  Discontinued        1 g 200 mL/hr over 30 Minutes Intravenous  Once 09/11/23 1340 09/11/23 1634   09/11/23 1345  azithromycin (ZITHROMAX) tablet 500 mg  Status:  Discontinued        500 mg Oral  Once 09/11/23 1340 09/11/23 1634      Subjective: [name] today has no fevers, no emesis,  No chest pain,   - Cough and dyspnea is not worse -Ileal conduit output much clearer   Objective: Vitals:   09/12/23 0420 09/12/23 0906 09/12/23 1339 09/12/23 1515  BP: (!) 96/55  (!) 99/53   Pulse: 63  64   Resp: 19  15   Temp: 98.1 F (36.7 C)  98.2 F (36.8 C)   TempSrc: Oral     SpO2: 97% 93% 94% 90%  Weight:      Height:         Intake/Output Summary (Last 24 hours) at 09/12/2023 1818 Last data filed at 09/12/2023 1635 Gross per 24 hour  Intake 2922.15 ml  Output 1700 ml  Net 1222.15 ml   Filed Weights   09/11/23 0919 09/11/23 1020 09/11/23 1534  Weight: 65.8 kg 64.4 kg 29.3 kg    Physical Exam  Gen:- Awake Alert, dyspnea on exertion HEENT:- [place].AT, No sclera icterus Neck-Supple Neck,No JVD,.  Lungs-   fair symmetrical air movement, no significant wheezing CV- S1, S2 normal, regular , 3/6 SM Abd-  +ve B.Sounds, Abd Soft, No tenderness,  ileal conduit with clear urine  Extremity/Skin:- No  edema, pedal pulses present  Psych-affect is appropriate, oriented x3 Neuro-no new focal deficits, no tremors  Data Reviewed: I have  personally reviewed following labs and imaging studies  CBC: Recent Labs  Lab 09/11/23 0935 09/11/23 1018 09/12/23 0402  WBC 13.3*  --  8.5  NEUTROABS  --  10.3*  --   HGB 11.6*  --  8.8*  HCT 37.4*  --  28.6*  MCV 94.0  --  94.4  PLT 400  --  246   Basic Metabolic Panel: Recent Labs  Lab 09/11/23 0935 09/12/23 0402  NA 131* 135  K 5.9* 5.2*  CL 101 111  CO2 20* 20*  GLUCOSE 120* 89  BUN 72* 58*  CREATININE 3.79* 2.53*  CALCIUM 9.3 8.0*  PHOS  --  4.1   GFR: Estimated Creatinine Clearance: 10.1 mL/min (A) (by C-G formula based on SCr of 2.53 mg/dL (H)). Liver Function Tests: Recent Labs  Lab 09/11/23 0935 09/12/23 0402  AST 13*  --   ALT 12  --   ALKPHOS 106  --   BILITOT 0.2*  --   PROT 7.5  --   ALBUMIN 3.8 2.6*   Recent Results (from the past 240 hour(s))  Blood culture (routine x 2)     Status: None (Preliminary result)   Collection Time: 09/11/23 10:15 AM   Specimen: BLOOD  Result Value Ref Range Status   Specimen Description BLOOD RIGHT ANTECUBITAL  Final   Special Requests   Final    BOTTLES DRAWN AEROBIC AND ANAEROBIC Blood Culture adequate volume   Culture   Final    NO GROWTH < 24 HOURS Performed at [facility], [address],  [place], [place] [address]    Report Status PENDING  Incomplete  Blood culture (routine x 2)     Status: None (Preliminary result)   Collection Time: 09/11/23 10:18 AM   Specimen: BLOOD  Result Value Ref Range Status   Specimen Description BLOOD BLOOD RIGHT FOREARM  Final   Special Requests   Final    BOTTLES DRAWN AEROBIC AND ANAEROBIC Blood Culture adequate volume   Culture   Final    NO GROWTH < 24 HOURS Performed at [facility], [address] [place], [place] [address]    Report Status PENDING  Incomplete  Resp panel by RT-PCR (RSV, Flu A&B, Covid) Urine, Clean Catch     Status: None   Collection Time: 09/11/23  2:29 PM   Specimen: Urine, Clean Catch; Nasal Swab  Result Value Ref Range Status   SARS Coronavirus 2 by RT PCR NEGATIVE NEGATIVE Final    Comment: (NOTE) SARS-CoV-2 target nucleic acids are NOT DETECTED.  The SARS-CoV-2 RNA is generally detectable in upper respiratory specimens during the acute phase of infection. The lowest concentration of SARS-CoV-2 viral copies this assay can detect is 138 copies/mL. A negative result does not preclude SARS-Cov-2 infection and should not be used as the sole basis for treatment or other patient management decisions. A negative result may occur with  improper specimen collection/handling, submission of specimen other than nasopharyngeal swab, presence of viral mutation(s) within the areas targeted by this assay, and inadequate number of viral copies(<138 copies/mL). A negative result must be combined with clinical observations, patient history, and epidemiological information. The expected result is Negative.  Fact Sheet for Patients:  BloggerCourse.com  Fact Sheet for Healthcare Providers:  SeriousBroker.it  This test is no t yet approved or cleared by the [place] FDA and  has been authorized for detection and/or diagnosis of SARS-CoV-2 by FDA under an Emergency Use  Authorization (EUA). This EUA will remain  in effect (meaning this test can be used) for the duration of the COVID-19 declaration under Section 564(b)(1) of the Act, 21 U.S.C.section 360bbb-3(b)(1), unless the authorization is terminated  or revoked sooner.       Influenza A by PCR NEGATIVE NEGATIVE Final   Influenza B by PCR NEGATIVE NEGATIVE Final    Comment: (NOTE) The Xpert Xpress SARS-CoV-2/FLU/RSV plus assay is intended as an aid in the diagnosis of influenza from Nasopharyngeal swab specimens and should not be used as a sole basis for treatment. Nasal washings and aspirates are unacceptable for Xpert Xpress SARS-CoV-2/FLU/RSV testing.  Fact Sheet for Patients: BloggerCourse.com  Fact Sheet for Healthcare Providers: SeriousBroker.it  This test is not yet approved or cleared by the [place] FDA and has been authorized for detection and/or diagnosis of SARS-CoV-2 by FDA under an Emergency Use Authorization (EUA). This EUA will remain in effect (meaning this test can be used) for the duration of the COVID-19 declaration under Section 564(b)(1) of the Act, 21 U.S.C. section 360bbb-3(b)(1), unless the authorization is terminated or revoked.     Resp Syncytial Virus by PCR NEGATIVE NEGATIVE Final    Comment: (NOTE) Fact Sheet for Patients: BloggerCourse.com  Fact Sheet for Healthcare Providers: SeriousBroker.it  This test is not yet approved or cleared by the [place] FDA and has been authorized for detection and/or diagnosis of SARS-CoV-2 by FDA under an Emergency Use Authorization (EUA). This EUA will remain in effect (meaning this test can be used) for the duration of the COVID-19 declaration under Section 564(b)(1) of the Act, 21 U.S.C. section 360bbb-3(b)(1), unless the authorization is terminated or revoked.  Performed at [facility], [address],  [place], [place] [address]   Urine Culture (for pregnant, neutropenic or urologic patients or patients with an indwelling urinary catheter)     Status: Abnormal   Collection Time: 09/11/23  2:29 PM   Specimen: Urine, Suprapubic  Result Value Ref Range Status   Specimen Description   Final    URINE, SUPRAPUBIC Performed at [facility], [address], [place], [place] [address]    Special Requests   Final    URINE, BAG PED Performed at [facility], [address], [place], [place] [address]    Culture MULTIPLE SPECIES PRESENT, SUGGEST RECOLLECTION (A)  Final   Report Status 09/12/2023 FINAL  Final    Radiology Studies: CT CHEST ABDOMEN PELVIS WO CONTRAST  Result Date: 09/11/2023 CLINICAL DATA:  Acute abdominal pain and chest pain radiating down over the last few days. Shortness of breath with some nausea EXAM: CT CHEST, ABDOMEN AND PELVIS WITHOUT CONTRAST TECHNIQUE: Multidetector CT imaging of the chest, abdomen and pelvis was performed following the standard protocol without IV contrast. RADIATION DOSE REDUCTION: This exam was performed according to the departmental dose-optimization program which includes automated exposure control, adjustment of the mA and/or kV according to patient size and/or use of iterative reconstruction technique. COMPARISON:  Chest x-ray earlier 09/11/2023. Chest CT 04/24/2023. Multiple prior ultrasounds and CT scans of the abdomen going back several years as well. FINDINGS: CT CHEST FINDINGS Cardiovascular: On this non IV contrast exam, heart is nonenlarged. No pericardial effusion. Coronary artery calcifications are seen. The thoracic aorta has a normal course and caliber with scattered vascular calcifications. Mediastinum/Nodes: Diffuse wall thickening of the esophagus. Please correlate with any symptoms. The thyroid gland is unremarkable. On this non IV contrast exam there is no specific abnormal lymph node enlargement identified in the axillary region, hilum or  mediastinum.  Lungs/Pleura: Tiny left effusion identified, unchanged from previous. There is chronic lung changes identified with interstitial septal thickening as well as reticular changes and centrilobular emphysematous change. There is breathing motion particularly along the lower lung zones limiting evaluation. Since the prior CT scan there is development of several spiculated nodular areas in the lungs, right-greater-than-left. Example in the middle lobe on series 3, image 83 measures 8 mm. Focus along the right upper lobe anteromedial on series 3, image 66 measures 7 mm. Slightly more confluence area along the inferior margin of the right upper lobe near the minor fissure measures 2.9 by 0.9 cm. These are has a differential. Example in the left lung of the apex on series 3, image 28 measures 5 mm. Musculoskeletal: Curvature of the spine. Osteopenia. Scattered degenerative changes. CT ABDOMEN PELVIS FINDINGS Hepatobiliary: On this non IV contrast exam, grossly preserved hepatic parenchyma. Tiny cystic areas seen in segment 4 stable. No specific imaging follow-up. Previous cholecystectomy. Pancreas: Global pancreatic atrophy. Spleen: Normal in size without focal abnormality. Adrenals/Urinary Tract: Stable thickening of the adrenal glands. Moderate atrophy of the kidneys with nonspecific perinephric stranding. The collecting systems are nondilated. There has been previous cystectomy with ileal loop conduit along the right anterior abdomen with an ostomy. Stomach/Bowel: On this non oral contrast exam large bowel has a normal course and caliber. Left-sided colonic diverticula identified in the sigmoid region. Scattered stool. Appendix not well seen in the right lower quadrant but no pericecal stranding or fluid. There is motion limiting evaluation. The stomach is distended with fluid and debris and air. Small bowel is nondilated Vascular/Lymphatic: Diffuse vascular calcifications. There is severe calcifications  along the iliac vessels. Known prior stent placement. Please correlate with symptomatology. Evaluation of the stents in the vascular stenosis is limited without the advantage of IV contrast. Normal caliber IVC. There are several upper abdominal prominent lymph nodes in the retroperitoneum which are similar to previous. Example near the celiac region on series 2, image 61 has short axis of 12 mm and previously 12 mm in August 2022. No new lymph node enlargement. Reproductive: Absence of the prostate. Other: Anasarca.  No free air or free fluid.  Motion. Musculoskeletal: Streak artifact related to the patient's dynamic right hip screw. Old healed pubic bone fractures. Global osteopenia with scattered degenerative changes. Particular degenerative changes of the spine at L2-3 with stenosis. Small sclerotic focus identified along the left iliac bone on series 2, image 94 is stable. IMPRESSION: Significant increase in reticular changes as well as scattered spiculated nodules in the lungs, right-greater-than-left. This has differential including atypical infection versus neoplasm. Please correlate with symptoms and short follow-up versus PET-CT scan. Stable left pleural effusion. Emphysematous lung changes. Diffuse wall thickening of the esophagus. Please correlate with symptoms and further workup when appropriate. 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 Atrophy of the kidneys. No obstructing stone. Sigmoid colon diverticula. No bowel obstruction, free air or free fluid. Stable borderline and mildly enlarged upper abdominal lymph nodes. Extensive vascular calcifications with stents. Electronically Signed   By: Karen Kays M.D.   On: 09/11/2023 13:27   DG Chest Portable 1 View  Result Date: 09/11/2023 CLINICAL DATA:  Chest pain EXAM: PORTABLE CHEST 1 VIEW COMPARISON:  CT 04/24/2023 FINDINGS: Hyperinflation. Chronic interstitial lung changes. Tiny left effusion or pleural thickening. Adjacent  scar or atelectasis. No pneumothorax or consolidation. Normal cardiopericardial silhouette. Film is rotated to the left. Surgical clips in the upper abdomen. IMPRESSION: Hyperinflation with chronic lung changes. Small  left effusion or pleural thickening with adjacent scar or atelectasis. Electronically Signed   By: Karen Kays M.D.   On: 09/11/2023 10:07    Scheduled Meds:  aspirin EC  81 mg Oral Q breakfast   atorvastatin  10 mg Oral Daily   cholecalciferol  1,000 Units Oral Daily   dextromethorphan-guaiFENesin  1 tablet Oral BID   heparin  5,000 Units Subcutaneous Q8H   ipratropium-albuterol  3 mL Nebulization TID   morphine  15 mg Oral q morning   nicotine  21 mg Transdermal Daily   pantoprazole  40 mg Oral Daily   psyllium  1 packet Oral BID   sodium bicarbonate  650 mg Oral TID   sodium chloride flush  3 mL Intravenous Q12H   sodium chloride flush  3 mL Intravenous Q12H   sodium zirconium cyclosilicate  10 g Oral BID   Continuous Infusions:  sodium chloride     azithromycin 500 mg (09/12/23 1627)   cefTRIAXone (ROCEPHIN)  IV 2 g (09/12/23 1626)   lactated ringers      LOS: 1 day   Shon Hale M.D on 09/12/2023 at 6:18 PM  Go to [email or web address] - for contact info  [facility] - Office  [phone]  If 7PM-7AM, please contact night-coverage [email or web address] 09/12/2023, 6:18 PM

## 2023-09-12 NOTE — Plan of Care (Signed)

## 2023-09-13 DIAGNOSIS — J438 Other emphysema: Secondary | ICD-10-CM

## 2023-09-13 DIAGNOSIS — Z720 Tobacco use: Secondary | ICD-10-CM | POA: Diagnosis not present

## 2023-09-13 DIAGNOSIS — J189 Pneumonia, unspecified organism: Secondary | ICD-10-CM | POA: Diagnosis not present

## 2023-09-13 DIAGNOSIS — N179 Acute kidney failure, unspecified: Secondary | ICD-10-CM | POA: Diagnosis not present

## 2023-09-13 LAB — BASIC METABOLIC PANEL
Anion gap: 5 (ref 5–15)
BUN: 37 mg/dL — ABNORMAL HIGH (ref 8–23)
CO2: 23 mmol/L (ref 22–32)
Calcium: 8.5 mg/dL — ABNORMAL LOW (ref 8.9–10.3)
Chloride: 110 mmol/L (ref 98–111)
Creatinine, Ser: 1.88 mg/dL — ABNORMAL HIGH (ref 0.61–1.24)
GFR, Estimated: 36 mL/min — ABNORMAL LOW (ref >60)
Glucose, Bld: 110 mg/dL — ABNORMAL HIGH (ref 70–99)
Potassium: 5.2 mmol/L — ABNORMAL HIGH (ref 3.5–5.1)
Sodium: 138 mmol/L (ref 135–145)

## 2023-09-13 MED ORDER — CEFDINIR 300 MG PO CAPS: 300.0000 mg | ORAL_CAPSULE | Freq: Every day | ORAL | 0 refills | Status: AC

## 2023-09-13 MED ORDER — PSYLLIUM 95 % PO PACK: 1.0000 | PACK | Freq: Two times a day (BID) | ORAL | 2 refills | Status: AC

## 2023-09-13 MED ORDER — SODIUM BICARBONATE 650 MG PO TABS: 650.0000 mg | ORAL_TABLET | Freq: Two times a day (BID) | ORAL | 2 refills | Status: AC

## 2023-09-13 MED ORDER — GUAIFENESIN ER 600 MG PO TB12
600.0000 mg | ORAL_TABLET | Freq: Two times a day (BID) | ORAL | 0 refills | Status: AC
Start: 2023-09-13 — End: 2023-09-23

## 2023-09-13 MED ORDER — SODIUM POLYSTYRENE SULFONATE PO POWD: Freq: Every day | ORAL | 2 refills | Status: DC

## 2023-09-13 MED ORDER — NICOTINE 21 MG/24HR TD PT24: 21.0000 mg | MEDICATED_PATCH | Freq: Every day | TRANSDERMAL | 0 refills | Status: DC

## 2023-09-13 MED ORDER — AZITHROMYCIN 250 MG PO TABS
500.0000 mg | ORAL_TABLET | Freq: Once | ORAL | Status: AC
  Administered 2023-09-13: 500 mg via ORAL
  Filled 2023-09-13: qty 2

## 2023-09-13 MED ORDER — SODIUM CHLORIDE 0.9 % IV SOLN
2.0000 g | INTRAVENOUS | Status: DC
  Administered 2023-09-13: 2 g via INTRAVENOUS
  Filled 2023-09-13: qty 20

## 2023-09-13 MED ORDER — LISINOPRIL 2.5 MG PO TABS: 2.5000 mg | ORAL_TABLET | Freq: Every day | ORAL | 0 refills | Status: DC

## 2023-09-13 MED ORDER — PREDNISONE 20 MG PO TABS: 40.0000 mg | ORAL_TABLET | Freq: Every day | ORAL | 0 refills | Status: AC

## 2023-09-13 MED ORDER — AZITHROMYCIN 500 MG PO TABS: 500.0000 mg | ORAL_TABLET | Freq: Every day | ORAL | 0 refills | Status: AC

## 2023-09-13 MED ORDER — SPIRIVA HANDIHALER 18 MCG IN CAPS: 18.0000 ug | ORAL_CAPSULE | Freq: Every day | RESPIRATORY_TRACT | 3 refills | Status: AC

## 2023-09-13 NOTE — Progress Notes (Signed)
Patient discharged home today, transported home by family. Discharge paperwork went over with patient and family, both verbalized understanding. Belongings sent home with patient.  ?

## 2023-09-13 NOTE — Progress Notes (Addendum)
Patients family at bedside was wondering if patient was going to receive Iron infusion prior to discharge. Patient was suppose to receive infusion on [facility] at the [facility]. MD Courage made aware. Patient and family informed to call an make an appointment as outpatient.

## 2023-09-13 NOTE — Discharge Instructions (Signed)
1)Please follow-up with hematologist/oncologist Dr. Doreatha Massed, MD---over the next 2 to 3 days---for repeat CBC on repeat BMP blood test---you may need iron infusion -Address: inside [facility] ([address] ([address]), [address] [place], [place], [place] [address] Phone: [phone]  2)Avoid ibuprofen/Advil/Aleve/Motrin/Goody Powders/Naproxen/BC powders/Meloxicam/Diclofenac/Indomethacin and other Nonsteroidal anti-inflammatory medications as these will make you more likely to bleed and can cause stomach ulcers, can also cause Kidney problems.   3) complete abstinence from tobacco strongly advised--please use nicotine patch to help you quit smoking as discussed  4)Please note that there has been some changes to your medications
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
## 2023-09-15 ENCOUNTER — Telehealth: Payer: Self-pay

## 2023-09-15 NOTE — Transitions of Care (Post Inpatient/ED Visit) (Signed)
09/15/2023  Name: ANJELO AMANI MRN: [id number] DOB: [birth year]  Today's TOC FU Call Status: Today's TOC FU Call Status:: Unsuccessful Call (1st Attempt)  Attempted to reach the patient regarding the most recent Inpatient/ED visit.  Follow Up Plan: Additional outreach attempts will be made to reach the patient to complete the Transitions of Care (Post Inpatient/ED visit) call.   Deidre Ala, RN Medical illustrator [facility] [phone]

## 2023-09-16 ENCOUNTER — Telehealth: Payer: Self-pay

## 2023-09-16 ENCOUNTER — Encounter: Payer: Self-pay | Admitting: Hematology

## 2023-09-16 LAB — CULTURE, BLOOD (ROUTINE X 2)
Culture: NO GROWTH
Culture: NO GROWTH
Special Requests: ADEQUATE
Special Requests: ADEQUATE

## 2023-09-16 NOTE — Transitions of Care (Post Inpatient/ED Visit) (Signed)
09/16/2023  Name: Jeffery Ponce MRN: [id number] DOB: [birth year]  Today's TOC FU Call Status: Today's TOC FU Call Status:: Successful TOC FU Call Completed TOC FU Call Complete Date: 09/16/23 Patient's Name and Date of Birth confirmed.  Transition Care Management Follow-up Telephone Call Date of Discharge: 09/13/23 [facility] Type of Discharge: Inpatient Admission Primary Inpatient Discharge Diagnosis:: Acute Kidney Injury How have you been since you were released from the hospital?: Better Any questions or concerns?: No  Items Reviewed: Did you receive and understand the discharge instructions provided?: Yes Medications obtained,verified, and reconciled?: Yes (Medications Reviewed) Any new allergies since your discharge?: No Dietary orders reviewed?: NA Do you have support at home?: Yes People in Home: spouse Name of Support/Comfort Primary Source: Philomena Doheny  Medications Reviewed Today: Medications Reviewed Today     Reviewed by Redge Gainer, RN (Case Manager) on 09/16/23 at 1156  Med List Status: <None>   Medication Order Taking? Sig Documenting Provider Last Dose Status Informant  acetaminophen (TYLENOL) 500 MG tablet [id number] Yes Take 500 mg by mouth every 6 (six) hours as needed for moderate pain. [provider] Taking Active Self  amLODipine (NORVASC) 5 MG tablet [id number] Yes 1 tablet Orally Once a day for 90 days [provider] Taking Active Self  atorvastatin (LIPITOR) 10 MG tablet [id number]  Take 1 tablet (10 mg total) by mouth daily. Annice Needy, MD  Expired 09/11/23 2359 Self  azithromycin (ZITHROMAX) 500 MG tablet [id number] Yes Take 1 tablet (500 mg total) by mouth daily for 3 days. Shon Hale, MD Taking Active   cefdinir (OMNICEF) 300 MG capsule [id number] Yes Take 1 capsule (300 mg total) by mouth daily for 3 days. Shon Hale, MD Taking Active   cholecalciferol (VITAMIN D) 25 MCG (1000 UT) tablet  [id number] Yes Take 1,000 Units by mouth daily.  [provider] Taking Active Self  FARXIGA 5 MG TABS tablet [id number] Yes 1 tablet Orally Once a day [provider] Taking Active Self  GNP ASPIRIN LOW DOSE 81 MG EC tablet [id number] Yes TAKE 1 TABLET BY MOUTH ONCE DAILY. Annice Needy, MD Taking Active Self  guaiFENesin (MUCINEX) 600 MG 12 hr tablet [id number] Yes Take 1 tablet (600 mg total) by mouth 2 (two) times daily for 10 days. Shon Hale, MD Taking Active   lisinopril (ZESTRIL) 2.5 MG tablet [id number] Yes Take 1 tablet (2.5 mg total) by mouth daily. Shon Hale, MD Taking Active   morphine (MSIR) 15 MG tablet [id number] Yes 1 tablet as needed Orally twice a day [provider] Taking Active Self  nicotine (NICODERM CQ - DOSED IN MG/24 HOURS) 21 mg/24hr patch [id number] No Place 1 patch (21 mg total) onto the skin daily. Shon Hale, MD Unknown Active   Oxycodone HCl 10 MG TABS [id number] Yes 1 tablet as needed Orally every 4 hours as needed [provider] Taking Active Self  predniSONE (DELTASONE) 20 MG tablet [id number] Yes Take 2 tablets (40 mg total) by mouth daily with breakfast for 5 days. Shon Hale, MD Taking Active   psyllium (HYDROCIL/METAMUCIL) 95 % PACK [id number] Yes Take 1 packet by mouth 2 (two) times daily. Shon Hale, MD Taking Active   sodium bicarbonate 650 MG tablet [id number] Yes Take 1 tablet (650 mg total) by mouth 2 (two) times daily. Shon Hale, MD Taking Active   sodium polystyrene (KAYEXALATE) powder [id number] Yes Take by mouth daily. Shon Hale, MD Taking Active   tiotropium [facility])  18 MCG inhalation capsule [id number] Yes Place 1 capsule (18 mcg total) into inhaler and inhale daily. Shon Hale, MD Taking Active             Home Care and Equipment/Supplies: Were Home Health Services Ordered?: No Any new equipment or medical supplies ordered?: No  Functional  Questionnaire: Do you need assistance with bathing/showering or dressing?: No Do you need assistance with meal preparation?: No Do you need assistance with eating?: No Do you have difficulty maintaining continence: No Do you need assistance with getting out of bed/getting out of a chair/moving?: No Do you have difficulty managing or taking your medications?: No  Follow up appointments reviewed: PCP Follow-up appointment confirmed?: Yes Date of PCP follow-up appointment?:  (Unsure. The patient has changed PCP) Follow-up Provider: Orpah Cobb with [facility] Follow-up appointment confirmed?: Yes Date of Specialist follow-up appointment?: 09/18/23 Follow-Up Specialty Provider:: Iron infusion with Hematology Do you need transportation to your follow-up appointment?: No Do you understand care options if your condition(s) worsen?: Yes-patient verbalized understanding  SDOH Interventions Today    Flowsheet Row Most Recent Value  SDOH Interventions   Food Insecurity Interventions Intervention Not Indicated      The patient declined enrollment and has switched his primary care provider to [facility].  Deidre Ala, RN Medical illustrator [facility] [phone]

## 2023-09-18 ENCOUNTER — Inpatient Hospital Stay: Payer: Medicare PPO

## 2023-09-18 VITALS — BP 129/50 | HR 63 | Temp 98.4°F | Resp 18 | Ht 70.5 in | Wt 149.6 lb

## 2023-09-18 DIAGNOSIS — D500 Iron deficiency anemia secondary to blood loss (chronic): Secondary | ICD-10-CM

## 2023-09-18 DIAGNOSIS — Z79899 Other long term (current) drug therapy: Secondary | ICD-10-CM | POA: Diagnosis not present

## 2023-09-18 DIAGNOSIS — D631 Anemia in chronic kidney disease: Secondary | ICD-10-CM | POA: Diagnosis not present

## 2023-09-18 DIAGNOSIS — N184 Chronic kidney disease, stage 4 (severe): Secondary | ICD-10-CM | POA: Diagnosis not present

## 2023-09-18 DIAGNOSIS — D649 Anemia, unspecified: Secondary | ICD-10-CM

## 2023-09-18 MED ORDER — FAMOTIDINE IN NACL 20-0.9 MG/50ML-% IV SOLN
20.0000 mg | Freq: Once | INTRAVENOUS | Status: AC
  Administered 2023-09-18: 20 mg via INTRAVENOUS
  Filled 2023-09-18: qty 50

## 2023-09-18 MED ORDER — CETIRIZINE HCL 10 MG PO TABS
10.0000 mg | ORAL_TABLET | Freq: Once | ORAL | Status: AC
  Administered 2023-09-18: 10 mg via ORAL
  Filled 2023-09-18: qty 1

## 2023-09-18 MED ORDER — SODIUM CHLORIDE 0.9 % IV SOLN
500.0000 mg | Freq: Once | INTRAVENOUS | Status: AC
  Administered 2023-09-18: 500 mg via INTRAVENOUS
  Filled 2023-09-18: qty 20

## 2023-09-18 MED ORDER — SODIUM CHLORIDE 0.9 % IV SOLN: Freq: Once | INTRAVENOUS | Status: AC

## 2023-09-18 MED ORDER — ACETAMINOPHEN 325 MG PO TABS
650.0000 mg | ORAL_TABLET | Freq: Once | ORAL | Status: AC
  Administered 2023-09-18: 650 mg via ORAL
  Filled 2023-09-18: qty 2

## 2023-09-18 NOTE — Progress Notes (Signed)
Patient presents today for iron infusion.  Patient is in satisfactory condition with no new complaints voiced.  Patient had a recent admission for AKI and pneumonia. MD made aware. Vital signs are stable.  We will proceed with infusion per MD orders.   Patient tolerated infusion well with no complaints voiced.  Patient left ambulatory in stable condition.  Vital signs stable at discharge.  Follow up as scheduled.

## 2023-09-18 NOTE — Patient Instructions (Signed)
[facility]  Discharge Instructions: Thank you for choosing [facility] to provide your oncology and hematology care.  If you have a lab appointment with the Cancer Center - please note that after April 8th, 2024, all labs will be drawn in the cancer center.  You do not have to check in or register with the main entrance as you have in the past but will complete your check-in in the cancer center.  Wear comfortable clothing and clothing appropriate for easy access to any Portacath or PICC line.   We strive to give you quality time with your provider. You may need to reschedule your appointment if you arrive late (15 or more minutes).  Arriving late affects you and other patients whose appointments are after yours.  Also, if you miss three or more appointments without notifying the office, you may be dismissed from the clinic at the provider's discretion.      For prescription refill requests, have your pharmacy contact our office and allow 72 hours for refills to be completed.    Today you received the following:  Venofer.  Iron Sucrose Injection What is this medication? IRON SUCROSE (EYE ern SOO krose) treats low levels of iron (iron deficiency anemia) in people with kidney disease. Iron is a mineral that plays an important role in making red blood cells, which carry oxygen from your lungs to the rest of your body. This medicine may be used for other purposes; ask your health care provider or pharmacist if you have questions. COMMON BRAND NAME(S): Venofer What should I tell my care team before I take this medication? They need to know if you have any of these conditions: Anemia not caused by low iron levels Heart disease High levels of iron in the blood Kidney disease Liver disease An unusual or allergic reaction to iron, other medications, foods, dyes, or preservatives Pregnant or trying to get pregnant Breastfeeding How should I use this  medication? This medication is for infusion into a vein. It is given in a hospital or clinic setting. Talk to your care team about the use of this medication in children. While this medication may be prescribed for children as young as 2 years for selected conditions, precautions do apply. Overdosage: If you think you have taken too much of this medicine contact a poison control center or emergency room at once. NOTE: This medicine is only for you. Do not share this medicine with others. What if I miss a dose? Keep appointments for follow-up doses. It is important not to miss your dose. Call your care team if you are unable to keep an appointment. What may interact with this medication? Do not take this medication with any of the following: Deferoxamine Dimercaprol Other iron products This medication may also interact with the following: Chloramphenicol Deferasirox This list may not describe all possible interactions. Give your health care provider a list of all the medicines, herbs, non-prescription drugs, or dietary supplements you use. Also tell them if you smoke, drink alcohol, or use illegal drugs. Some items may interact with your medicine. What should I watch for while using this medication? Visit your care team regularly. Tell your care team if your symptoms do not start to get better or if they get worse. You may need blood work done while you are taking this medication. You may need to follow a special diet. Talk to your care team. Foods that contain iron include: whole grains/cereals, dried fruits, beans,  or peas, leafy green vegetables, and organ meats (liver, kidney). What side effects may I notice from receiving this medication? Side effects that you should report to your care team as soon as possible: Allergic reactions--skin rash, itching, hives, swelling of the face, lips, tongue, or throat Low blood pressure--dizziness, feeling faint or lightheaded, blurry vision Shortness of  breath Side effects that usually do not require medical attention (report to your care team if they continue or are bothersome): Flushing Headache Joint pain Muscle pain Nausea Pain, redness, or irritation at injection site This list may not describe all possible side effects. Call your doctor for medical advice about side effects. You may report side effects to FDA at 1-800-FDA-1088. Where should I keep my medication? This medication is given in a hospital or clinic. It will not be stored at home. NOTE: This sheet is a summary. It may not cover all possible information. If you have questions about this medicine, talk to your doctor, pharmacist, or health care provider.  2024 Elsevier/Gold Standard (2023-05-02 00:00:00)     To help prevent nausea and vomiting after your treatment, we encourage you to take your nausea medication as directed.  BELOW ARE SYMPTOMS THAT SHOULD BE REPORTED IMMEDIATELY: *FEVER GREATER THAN 100.4 F (38 C) OR HIGHER *CHILLS OR SWEATING *NAUSEA AND VOMITING THAT IS NOT CONTROLLED WITH YOUR NAUSEA MEDICATION *UNUSUAL SHORTNESS OF BREATH *UNUSUAL BRUISING OR BLEEDING *URINARY PROBLEMS (pain or burning when urinating, or frequent urination) *BOWEL PROBLEMS (unusual diarrhea, constipation, pain near the anus) TENDERNESS IN MOUTH AND THROAT WITH OR WITHOUT PRESENCE OF ULCERS (sore throat, sores in mouth, or a toothache) UNUSUAL RASH, SWELLING OR PAIN  UNUSUAL VAGINAL DISCHARGE OR ITCHING   Items with * indicate a potential emergency and should be followed up as soon as possible or go to the Emergency Department if any problems should occur.  Please show the CHEMOTHERAPY ALERT CARD or IMMUNOTHERAPY ALERT CARD at check-in to the Emergency Department and triage nurse.  Should you have questions after your visit or need to cancel or reschedule your appointment, please contact [facility] [phone]  and follow the prompts.  Office hours are  8:00 a.m. to 4:30 p.m. Monday - Friday. Please note that voicemails left after 4:00 p.m. may not be returned until the following business day.  We are closed weekends and major holidays. You have access to a nurse at all times for urgent questions. Please call the main number to the clinic [phone] and follow the prompts.  For any non-urgent questions, you may also contact your provider using MyChart. We now offer e-Visits for anyone 55 and older to request care online for non-urgent symptoms. For details visit [email or web address].   Also download the MyChart app! Go to the app store, search "MyChart", open the app, select [facility], and log in with your MyChart username and password.

## 2023-09-24 DIAGNOSIS — Z09 Encounter for follow-up examination after completed treatment for conditions other than malignant neoplasm: Secondary | ICD-10-CM | POA: Diagnosis not present

## 2023-09-24 DIAGNOSIS — Z720 Tobacco use: Secondary | ICD-10-CM | POA: Diagnosis not present

## 2023-09-24 DIAGNOSIS — N1832 Chronic kidney disease, stage 3b: Secondary | ICD-10-CM | POA: Diagnosis not present

## 2023-09-24 DIAGNOSIS — E875 Hyperkalemia: Secondary | ICD-10-CM | POA: Diagnosis not present

## 2023-09-24 DIAGNOSIS — E8722 Chronic metabolic acidosis: Secondary | ICD-10-CM | POA: Diagnosis not present

## 2023-09-24 DIAGNOSIS — E611 Iron deficiency: Secondary | ICD-10-CM | POA: Diagnosis not present

## 2023-09-24 DIAGNOSIS — D638 Anemia in other chronic diseases classified elsewhere: Secondary | ICD-10-CM | POA: Diagnosis not present

## 2023-09-24 DIAGNOSIS — J432 Centrilobular emphysema: Secondary | ICD-10-CM | POA: Diagnosis not present

## 2023-09-24 DIAGNOSIS — J189 Pneumonia, unspecified organism: Secondary | ICD-10-CM | POA: Diagnosis not present

## 2023-10-01 DIAGNOSIS — E875 Hyperkalemia: Secondary | ICD-10-CM | POA: Diagnosis not present

## 2023-10-01 DIAGNOSIS — R809 Proteinuria, unspecified: Secondary | ICD-10-CM | POA: Diagnosis not present

## 2023-10-01 DIAGNOSIS — N2581 Secondary hyperparathyroidism of renal origin: Secondary | ICD-10-CM | POA: Diagnosis not present

## 2023-10-01 DIAGNOSIS — I129 Hypertensive chronic kidney disease with stage 1 through stage 4 chronic kidney disease, or unspecified chronic kidney disease: Secondary | ICD-10-CM | POA: Diagnosis not present

## 2023-10-01 DIAGNOSIS — N1832 Chronic kidney disease, stage 3b: Secondary | ICD-10-CM | POA: Diagnosis not present

## 2023-10-07 DIAGNOSIS — E875 Hyperkalemia: Secondary | ICD-10-CM | POA: Diagnosis not present

## 2023-10-07 DIAGNOSIS — R809 Proteinuria, unspecified: Secondary | ICD-10-CM | POA: Diagnosis not present

## 2023-10-07 DIAGNOSIS — E8722 Chronic metabolic acidosis: Secondary | ICD-10-CM | POA: Diagnosis not present

## 2023-10-07 DIAGNOSIS — N1832 Chronic kidney disease, stage 3b: Secondary | ICD-10-CM | POA: Diagnosis not present

## 2023-10-31 DIAGNOSIS — M5451 Vertebrogenic low back pain: Secondary | ICD-10-CM | POA: Diagnosis not present

## 2023-10-31 DIAGNOSIS — Z79899 Other long term (current) drug therapy: Secondary | ICD-10-CM | POA: Diagnosis not present

## 2023-10-31 DIAGNOSIS — M1611 Unilateral primary osteoarthritis, right hip: Secondary | ICD-10-CM | POA: Diagnosis not present

## 2023-11-03 ENCOUNTER — Encounter (INDEPENDENT_AMBULATORY_CARE_PROVIDER_SITE_OTHER): Payer: Self-pay | Admitting: *Deleted

## 2023-11-04 NOTE — Progress Notes (Unsigned)
VIRTUAL VISIT via TELEPHONE NOTE [facility]   I connected with Jeffery Ponce  on 11/05/23 at  2:51 PM by telephone and verified that I am speaking with the correct person using two identifiers.  Location: Patient: Home Provider: [facility]   I discussed the limitations, risks, security and privacy concerns of performing an evaluation and management service by telephone and the availability of in person appointments. I also discussed with the patient that there may be a patient responsible charge related to this service. The patient expressed understanding and agreed to proceed.  REASON FOR VISIT:  Follow-up for anemia of CKD and iron deficiency + history of bladder cancer  INTERVAL HISTORY:  Jeffery Ponce is contacted today for follow-up of  anemia of CKD and iron deficiency, as well as his history of bladder cancer.  He was last seen by Rojelio Brenner [name] on 09/08/2023.  He most recently received Venofer 500 mg x 1 in October 2024.   Since his last visit, he was hospitalized from 09/11/2023 through 09/13/2023 due to AKI and pneumonia.  He has had some recurrent fatigue following the hospitalization, but is slowly improving.  He continues to report "soreness in his chest" when he takes a deep breath, but denies any dyspnea on exertion, cough, or hemoptysis.  He lost some weight during hospital stay, but is starting to gain it back.  He denies any rectal bleeding or melena.  He denies any pica, headaches, lightheadedness, or syncope.   He continues to follow with urology. He denies any hematuria / blood in his suprapubic catheter bag.  He has had abdominal pain and bloating after eating, which is being followed by GI.  He denies any dysphagia.  He has 30% energy and 100% appetite.   REVIEW OF SYSTEMS:   Review of Systems  Constitutional:  Positive for malaise/fatigue. Negative for chills, diaphoresis, fever and weight loss.  Respiratory:  Negative for  cough and shortness of breath.   Cardiovascular:  Positive for chest pain (when taking a deep breath). Negative for palpitations.  Gastrointestinal:  Negative for abdominal pain, blood in stool, melena, nausea and vomiting.  Neurological:  Negative for dizziness and headaches.     PHYSICAL EXAM: (per limitations of virtual telephone visit)  The patient is alert and oriented x 3, exhibiting adequate mentation, good mood, and ability to speak in full sentences and execute sound judgement.  ASSESSMENT & PLAN:  1.  Normocytic anemia: - Combination anemia from CKD and IDA. - EGD on 12/22/2019 showed normal esophagus, multiple petechial hemorrhages noted at gastric body and antrum.  Normal duodenal bulb and second part of duodenum. - Capsule study on 12/22/2019 showed multiple petechial hemorrhages involving gastric mucosa, fresh blood noted on images likely from trauma as the capsule forced across the pylorus.  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  Dark black liquid noted in the distal small bowel likely due to iron. - Receives intermittent parenteral iron therapy.  Most recent IV Venofer 500 mg x 1 in October 2024 (Hgb 9.6, ferritin 209, iron saturation 16%) - Labs today (11/05/2023): Hgb 12.1/MCV 93.9.  Ferritin 154, iron saturation 25% with low TIBC 190. - No rectal bleeding, melena, or hematuria - Symptomatic with ongoing fatigue - PLAN: Hemoglobin significantly improved after IV iron.  No additional IV iron at this time. - Labs and RTC in 3 months.  If Hgb remains <10.0 despite IV iron, we will initiate on Retacrit therapy at that time.  2.  Abnormal CT scan  - CT chest/abdomen/pelvis without contrast obtained in ED on 09/11/2023 significant for the following: Significant increase in reticular changes as well as scattered spiculated nodules in the lungs, right greater than left.  This has differential included atypical infection versus neoplasm.  Recommended correlating with symptoms and short  follow-up versus PET/CT scan. Diffuse wall thickening of esophagus, recommend correlation with symptoms and further workup when appropriate.  (Per hospital discharge note, thought to be esophagitis in the setting of recurrent emesis, and is following with GI outpatient)  Stable borderline and mildly enlarged upper abdominal lymph nodes  - PLAN: Will schedule patient for repeat CT chest to ensure resolution of spiculated nodules that were concerning for atypical infection versus neoplasm. - CC'd chart to GI regarding esophageal thickening (sees NP Doylene Bode on 01/22/2024)  3.  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 -CT CAP on 06/20/2020 with no findings of recurrence or metastatic disease.  Hepatic morphology with mild cirrhosis cannot be excluded.  Similar bilateral pulmonary nodules, favoring benign etiology.  Slight enlargement of mediastinal node which is not pathologic considered reactive. - CTAP on 07/13/2021: Right lower quadrant ileal conduit with no evidence of recurrence. - CT low-dose of the chest on 04/24/2023: Lung RADS 2 with no suspicious masses. - Continues to follow with urology, reportedly seen by Dr. Berneice Heinrich last month.  He denies any hematuria or blood in his suprapubic catheter bag.  He has had abdominal pain and bloating after eating, which is being followed by GI.  He reports that his urologist is scheduling him for upcoming abdominopelvic CT scan.   4.  CKD stage IV - Patient follows with Dr. Wolfgang Phoenix  5.  Tobacco use - Patient reports smoking since he was 77 years old.  Quit smoking after hospitalization in October 2024.   - Patient is too old to qualify for annual LCS/LDCT chest   PLAN SUMMARY: >> CT chest without contrast in  1 month >> Same-day labs (CBC/D, CMP, ferritin, iron/TIBC + OFFICE visit in 3 months     I discussed the assessment and treatment plan with the patient. The patient was provided an opportunity to ask questions and all were answered. The patient agreed with the plan and demonstrated an understanding of the instructions.   The patient was advised to call back or seek an in-person evaluation if the symptoms worsen or if the condition fails to improve as anticipated.  I provided 22 minutes of non-face-to-face time during this encounter.  Carnella Guadalajara, [name] 11/05/23 7:20 PM

## 2023-11-05 ENCOUNTER — Encounter: Payer: Self-pay | Admitting: Physician Assistant

## 2023-11-05 ENCOUNTER — Inpatient Hospital Stay: Payer: Medicare PPO

## 2023-11-05 ENCOUNTER — Inpatient Hospital Stay: Payer: Medicare PPO | Attending: Hematology | Admitting: Physician Assistant

## 2023-11-05 DIAGNOSIS — R634 Abnormal weight loss: Secondary | ICD-10-CM | POA: Insufficient documentation

## 2023-11-05 DIAGNOSIS — F17200 Nicotine dependence, unspecified, uncomplicated: Secondary | ICD-10-CM | POA: Insufficient documentation

## 2023-11-05 DIAGNOSIS — R590 Localized enlarged lymph nodes: Secondary | ICD-10-CM | POA: Diagnosis not present

## 2023-11-05 DIAGNOSIS — N184 Chronic kidney disease, stage 4 (severe): Secondary | ICD-10-CM | POA: Insufficient documentation

## 2023-11-05 DIAGNOSIS — R9389 Abnormal findings on diagnostic imaging of other specified body structures: Secondary | ICD-10-CM

## 2023-11-05 DIAGNOSIS — R140 Abdominal distension (gaseous): Secondary | ICD-10-CM | POA: Insufficient documentation

## 2023-11-05 DIAGNOSIS — Z8551 Personal history of malignant neoplasm of bladder: Secondary | ICD-10-CM | POA: Insufficient documentation

## 2023-11-05 DIAGNOSIS — R109 Unspecified abdominal pain: Secondary | ICD-10-CM | POA: Diagnosis not present

## 2023-11-05 DIAGNOSIS — Z79899 Other long term (current) drug therapy: Secondary | ICD-10-CM | POA: Diagnosis not present

## 2023-11-05 DIAGNOSIS — D500 Iron deficiency anemia secondary to blood loss (chronic): Secondary | ICD-10-CM

## 2023-11-05 DIAGNOSIS — R5383 Other fatigue: Secondary | ICD-10-CM | POA: Insufficient documentation

## 2023-11-05 DIAGNOSIS — D631 Anemia in chronic kidney disease: Secondary | ICD-10-CM | POA: Insufficient documentation

## 2023-11-05 DIAGNOSIS — R911 Solitary pulmonary nodule: Secondary | ICD-10-CM | POA: Diagnosis not present

## 2023-11-05 DIAGNOSIS — C679 Malignant neoplasm of bladder, unspecified: Secondary | ICD-10-CM

## 2023-11-05 LAB — IRON AND TIBC
Iron: 48 ug/dL (ref 45–182)
Saturation Ratios: 25 % (ref 17.9–39.5)
TIBC: 190 ug/dL — ABNORMAL LOW (ref 250–450)
UIBC: 142 ug/dL

## 2023-11-05 LAB — COMPREHENSIVE METABOLIC PANEL
ALT: 10 U/L (ref 0–44)
AST: 10 U/L — ABNORMAL LOW (ref 15–41)
Albumin: 3.6 g/dL (ref 3.5–5.0)
Alkaline Phosphatase: 73 U/L (ref 38–126)
Anion gap: 9 (ref 5–15)
BUN: 23 mg/dL (ref 8–23)
CO2: 22 mmol/L (ref 22–32)
Calcium: 8.9 mg/dL (ref 8.9–10.3)
Chloride: 108 mmol/L (ref 98–111)
Creatinine, Ser: 1.29 mg/dL — ABNORMAL HIGH (ref 0.61–1.24)
GFR, Estimated: 57 mL/min — ABNORMAL LOW (ref >60)
Glucose, Bld: 93 mg/dL (ref 70–99)
Potassium: 3 mmol/L — ABNORMAL LOW (ref 3.5–5.1)
Sodium: 139 mmol/L (ref 135–145)
Total Bilirubin: 0.7 mg/dL (ref <1.2)
Total Protein: 6.4 g/dL — ABNORMAL LOW (ref 6.5–8.1)

## 2023-11-05 LAB — CBC WITH DIFFERENTIAL/PLATELET
Abs Immature Granulocytes: 0.04 10*3/uL (ref 0.00–0.07)
Basophils Absolute: 0 10*3/uL (ref 0.0–0.1)
Basophils Relative: 0 %
Eosinophils Absolute: 0.3 10*3/uL (ref 0.0–0.5)
Eosinophils Relative: 4 %
HCT: 38.5 % — ABNORMAL LOW (ref 39.0–52.0)
Hemoglobin: 12.1 g/dL — ABNORMAL LOW (ref 13.0–17.0)
Immature Granulocytes: 1 %
Lymphocytes Relative: 21 %
Lymphs Abs: 1.5 10*3/uL (ref 0.7–4.0)
MCH: 29.5 pg (ref 26.0–34.0)
MCHC: 31.4 g/dL (ref 30.0–36.0)
MCV: 93.9 fL (ref 80.0–100.0)
Monocytes Absolute: 0.5 10*3/uL (ref 0.1–1.0)
Monocytes Relative: 8 %
Neutro Abs: 4.7 10*3/uL (ref 1.7–7.7)
Neutrophils Relative %: 66 %
Platelets: 212 10*3/uL (ref 150–400)
RBC: 4.1 MIL/uL — ABNORMAL LOW (ref 4.22–5.81)
RDW: 14 % (ref 11.5–15.5)
WBC: 7.1 10*3/uL (ref 4.0–10.5)
nRBC: 0 % (ref 0.0–0.2)

## 2023-11-05 LAB — FERRITIN: Ferritin: 154 ng/mL (ref 24–336)

## 2023-11-10 ENCOUNTER — Telehealth (INDEPENDENT_AMBULATORY_CARE_PROVIDER_SITE_OTHER): Payer: Self-pay | Admitting: Gastroenterology

## 2023-11-10 DIAGNOSIS — K7460 Unspecified cirrhosis of liver: Secondary | ICD-10-CM

## 2023-11-10 NOTE — Telephone Encounter (Signed)
Pt called in and states he received a letter that it is time to schedule his 6 month [place]. Pt states he is feeling ok so he is not sure if he needs [place]. Would you like me to schedule US Abdomen Limitied RUQ? Please advise. Thank you.

## 2023-11-11 NOTE — Telephone Encounter (Signed)
[place] scheduled 11/14/23 at 10:30am. Pt is to arrive 15 minutes early. NPO after midnight. Left message to return call

## 2023-11-11 NOTE — Addendum Note (Signed)
Addended by: Marlowe Shores on: 11/11/2023 08:40 AM   Modules accepted: Orders

## 2023-11-14 ENCOUNTER — Ambulatory Visit (HOSPITAL_COMMUNITY): Admission: RE | Admit: 2023-11-14 | Payer: Medicare PPO | Source: Ambulatory Visit

## 2023-11-26 ENCOUNTER — Encounter: Payer: Self-pay | Admitting: *Deleted

## 2023-12-05 ENCOUNTER — Ambulatory Visit (HOSPITAL_COMMUNITY): Payer: Medicare PPO

## 2023-12-09 ENCOUNTER — Ambulatory Visit (HOSPITAL_COMMUNITY)
Admission: RE | Admit: 2023-12-09 | Discharge: 2023-12-09 | Disposition: A | Discharge status: Home / Self Care | Payer: Medicare PPO | Source: Ambulatory Visit | Attending: Gastroenterology | Admitting: Gastroenterology

## 2023-12-09 ENCOUNTER — Ambulatory Visit (HOSPITAL_COMMUNITY)
Admission: RE | Admit: 2023-12-09 | Discharge: 2023-12-09 | Disposition: A | Discharge status: Home / Self Care | Payer: Medicare PPO | Source: Ambulatory Visit | Attending: Physician Assistant

## 2023-12-09 DIAGNOSIS — R911 Solitary pulmonary nodule: Secondary | ICD-10-CM | POA: Diagnosis not present

## 2023-12-09 DIAGNOSIS — K7460 Unspecified cirrhosis of liver: Secondary | ICD-10-CM | POA: Diagnosis not present

## 2023-12-09 DIAGNOSIS — R9389 Abnormal findings on diagnostic imaging of other specified body structures: Secondary | ICD-10-CM

## 2023-12-09 DIAGNOSIS — R918 Other nonspecific abnormal finding of lung field: Secondary | ICD-10-CM | POA: Diagnosis not present

## 2023-12-09 DIAGNOSIS — J439 Emphysema, unspecified: Secondary | ICD-10-CM | POA: Diagnosis not present

## 2023-12-09 DIAGNOSIS — Z9049 Acquired absence of other specified parts of digestive tract: Secondary | ICD-10-CM | POA: Diagnosis not present

## 2023-12-23 ENCOUNTER — Telehealth: Payer: Self-pay | Admitting: *Deleted

## 2023-12-23 NOTE — Telephone Encounter (Signed)
 Patient notified per recommendation of [place], [facility] that most of the previously noted lung nodules have resolved - she suspects that they were related to his pneumonia in 2024, as there was no evidence of lung cancer. He does have a few small remaining lung nodules that are unchanged, so we will check another CT scan in 1 year.  Patient verbalized understanding.

## 2024-01-07 ENCOUNTER — Encounter: Payer: Self-pay | Admitting: Hematology

## 2024-01-15 ENCOUNTER — Encounter: Payer: Self-pay | Admitting: Hematology

## 2024-01-16 ENCOUNTER — Other Ambulatory Visit (INDEPENDENT_AMBULATORY_CARE_PROVIDER_SITE_OTHER): Payer: Self-pay | Admitting: *Deleted

## 2024-01-16 ENCOUNTER — Telehealth (INDEPENDENT_AMBULATORY_CARE_PROVIDER_SITE_OTHER): Payer: Self-pay | Admitting: *Deleted

## 2024-01-16 DIAGNOSIS — K7460 Unspecified cirrhosis of liver: Secondary | ICD-10-CM

## 2024-01-16 NOTE — Telephone Encounter (Addendum)
 Copied from reminder file  ----- Message from Nurse Sari SAUNDERS sent at 07/21/2023  3:43 PM EDT ----- Regarding: lab Per [facility] due 01/21/2024  I called and left a message for patient to return call to discuss with him  Lab order put in for [facility] for AFP tumor maker.

## 2024-01-16 NOTE — Telephone Encounter (Signed)
 Patient notified

## 2024-01-22 ENCOUNTER — Ambulatory Visit (INDEPENDENT_AMBULATORY_CARE_PROVIDER_SITE_OTHER): Payer: Medicare PPO | Admitting: Gastroenterology
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
## 2024-02-16 ENCOUNTER — Inpatient Hospital Stay: Payer: Medicare PPO | Attending: Hematology | Admitting: Physician Assistant

## 2024-02-16 ENCOUNTER — Inpatient Hospital Stay: Payer: Medicare PPO

## 2024-02-16 DIAGNOSIS — Z833 Family history of diabetes mellitus: Secondary | ICD-10-CM | POA: Diagnosis not present

## 2024-02-16 DIAGNOSIS — D631 Anemia in chronic kidney disease: Secondary | ICD-10-CM | POA: Insufficient documentation

## 2024-02-16 DIAGNOSIS — F17210 Nicotine dependence, cigarettes, uncomplicated: Secondary | ICD-10-CM | POA: Diagnosis not present

## 2024-02-16 DIAGNOSIS — Z79899 Other long term (current) drug therapy: Secondary | ICD-10-CM | POA: Insufficient documentation

## 2024-02-16 DIAGNOSIS — Z800 Family history of malignant neoplasm of digestive organs: Secondary | ICD-10-CM | POA: Insufficient documentation

## 2024-02-16 DIAGNOSIS — Z881 Allergy status to other antibiotic agents status: Secondary | ICD-10-CM | POA: Insufficient documentation

## 2024-02-16 DIAGNOSIS — Z885 Allergy status to narcotic agent status: Secondary | ICD-10-CM | POA: Diagnosis not present

## 2024-02-16 DIAGNOSIS — Z880 Allergy status to penicillin: Secondary | ICD-10-CM | POA: Insufficient documentation

## 2024-02-16 DIAGNOSIS — Z5986 Financial insecurity: Secondary | ICD-10-CM | POA: Insufficient documentation

## 2024-02-16 DIAGNOSIS — N184 Chronic kidney disease, stage 4 (severe): Secondary | ICD-10-CM | POA: Insufficient documentation

## 2024-02-16 DIAGNOSIS — Z87442 Personal history of urinary calculi: Secondary | ICD-10-CM | POA: Insufficient documentation

## 2024-02-16 DIAGNOSIS — Z888 Allergy status to other drugs, medicaments and biological substances status: Secondary | ICD-10-CM | POA: Diagnosis not present

## 2024-02-16 DIAGNOSIS — D509 Iron deficiency anemia, unspecified: Secondary | ICD-10-CM | POA: Insufficient documentation

## 2024-02-16 DIAGNOSIS — E7800 Pure hypercholesterolemia, unspecified: Secondary | ICD-10-CM | POA: Insufficient documentation

## 2024-02-16 DIAGNOSIS — Z8249 Family history of ischemic heart disease and other diseases of the circulatory system: Secondary | ICD-10-CM | POA: Diagnosis not present

## 2024-02-16 DIAGNOSIS — D500 Iron deficiency anemia secondary to blood loss (chronic): Secondary | ICD-10-CM

## 2024-02-16 LAB — CBC WITH DIFFERENTIAL/PLATELET
Abs Immature Granulocytes: 0.06 10*3/uL (ref 0.00–0.07)
Basophils Absolute: 0 10*3/uL (ref 0.0–0.1)
Basophils Relative: 0 %
Eosinophils Absolute: 0.1 10*3/uL (ref 0.0–0.5)
Eosinophils Relative: 1 %
HCT: 39.7 % (ref 39.0–52.0)
Hemoglobin: 12.5 g/dL — ABNORMAL LOW (ref 13.0–17.0)
Immature Granulocytes: 1 %
Lymphocytes Relative: 14 %
Lymphs Abs: 1.8 10*3/uL (ref 0.7–4.0)
MCH: 29.1 pg (ref 26.0–34.0)
MCHC: 31.5 g/dL (ref 30.0–36.0)
MCV: 92.3 fL (ref 80.0–100.0)
Monocytes Absolute: 0.8 10*3/uL (ref 0.1–1.0)
Monocytes Relative: 6 %
Neutro Abs: 10.4 10*3/uL — ABNORMAL HIGH (ref 1.7–7.7)
Neutrophils Relative %: 78 %
Platelets: 272 10*3/uL (ref 150–400)
RBC: 4.3 MIL/uL (ref 4.22–5.81)
RDW: 14.7 % (ref 11.5–15.5)
WBC: 13.3 10*3/uL — ABNORMAL HIGH (ref 4.0–10.5)
nRBC: 0 % (ref 0.0–0.2)

## 2024-02-16 LAB — COMPREHENSIVE METABOLIC PANEL
ALT: 8 U/L (ref 0–44)
AST: 13 U/L — ABNORMAL LOW (ref 15–41)
Albumin: 4.2 g/dL (ref 3.5–5.0)
Alkaline Phosphatase: 79 U/L (ref 38–126)
Anion gap: 11 (ref 5–15)
BUN: 59 mg/dL — ABNORMAL HIGH (ref 8–23)
CO2: 18 mmol/L — ABNORMAL LOW (ref 22–32)
Calcium: 9.4 mg/dL (ref 8.9–10.3)
Chloride: 112 mmol/L — ABNORMAL HIGH (ref 98–111)
Creatinine, Ser: 2.54 mg/dL — ABNORMAL HIGH (ref 0.61–1.24)
GFR, Estimated: 25 mL/min — ABNORMAL LOW (ref >60)
Glucose, Bld: 131 mg/dL — ABNORMAL HIGH (ref 70–99)
Potassium: 5 mmol/L (ref 3.5–5.1)
Sodium: 141 mmol/L (ref 135–145)
Total Bilirubin: 0.3 mg/dL (ref 0.0–1.2)
Total Protein: 7.7 g/dL (ref 6.5–8.1)

## 2024-02-16 LAB — IRON AND TIBC
Iron: 72 ug/dL (ref 45–182)
Saturation Ratios: 26 % (ref 17.9–39.5)
TIBC: 275 ug/dL (ref 250–450)
UIBC: 203 ug/dL

## 2024-02-16 LAB — FERRITIN: Ferritin: 240 ng/mL (ref 24–336)

## 2024-02-16 NOTE — Patient Instructions (Signed)
 [facility] at [facility] **VISIT SUMMARY & IMPORTANT INSTRUCTIONS **   You were seen today by Rojelio Brenner [name] for your anemia and your history of bladder cancer.    ANEMIA: Your anemia is related to your iron deficiency as well as your chronic kidney disease. You continue to have very mild anemia, but it is stable at this time. You do not need any IV iron at this time.  CHRONIC KIDNEY DISEASE: Your creatinine/kidney function is worse than it was at your last appointment.  Make sure that you are drinking plenty of water.  It is also important that you DO NOT TAKE medications such as ibuprofen, Motrin, Aleve, Advil, or naproxen.  Continue follow-up with your kidney doctor (Dr. Wolfgang Phoenix)  HISTORY OF BLADDER CANCER Continue follow-up with your urologist (Dr. Berneice Heinrich)  LIVER CIRRHOSIS: You missed your appointment with your gastroenterologist (NP Doylene Bode) in February 2025.  Please call her office at [phone] to reschedule that appointment.  FOLLOW-UP APPOINTMENT: Labs and office visit in 4 months  ** Thank you for trusting me with your healthcare!  I strive to provide all of my patients with quality care at each visit.  If you receive a survey for this visit, I would be so grateful to you for taking the time to provide feedback.  Thank you in advance!  ~ Anasha Perfecto                   Dr. Doreatha Massed   &   Rojelio Brenner, [name]   - - - - - - - - - - - - - - - - - -    Thank you for choosing [facility] at [facility] to provide your oncology and hematology care.  To afford each patient quality time with our provider, please arrive at least 15 minutes before your scheduled appointment time.   If you have a lab appointment with the Cancer Center please come in thru the Main Entrance and check in at the main information desk.  You need to re-schedule your appointment should you arrive 10 or more minutes late.  We strive to give  you quality time with our providers, and arriving late affects you and other patients whose appointments are after yours.  Also, if you no show three or more times for appointments you may be dismissed from the clinic at the providers discretion.     Again, thank you for choosing [facility].  Our hope is that these requests will decrease the amount of time that you wait before being seen by our physicians.       _____________________________________________________________  Should you have questions after your visit to [facility], please contact our office at [phone] and follow the prompts.  Our office hours are 8:00 a.m. and 4:30 p.m. Monday - Friday.  Please note that voicemails left after 4:00 p.m. may not be returned until the following business day.  We are closed weekends and major holidays.  You do have access to a nurse 24-7, just call the main number to the clinic [phone] and do not press any options, hold on the line and a nurse will answer the phone.    For prescription refill requests, have your pharmacy contact our office and allow 72 hours.

## 2024-03-04 ENCOUNTER — Ambulatory Visit (INDEPENDENT_AMBULATORY_CARE_PROVIDER_SITE_OTHER): Admitting: Gastroenterology

## 2024-03-04 ENCOUNTER — Encounter (INDEPENDENT_AMBULATORY_CARE_PROVIDER_SITE_OTHER): Payer: Self-pay | Admitting: Gastroenterology

## 2024-03-04 VITALS — BP 129/64 | HR 78 | Temp 98.3°F | Ht 70.5 in | Wt 161.7 lb

## 2024-03-04 DIAGNOSIS — K7460 Unspecified cirrhosis of liver: Secondary | ICD-10-CM

## 2024-03-04 DIAGNOSIS — K5900 Constipation, unspecified: Secondary | ICD-10-CM | POA: Diagnosis not present

## 2024-03-04 DIAGNOSIS — D500 Iron deficiency anemia secondary to blood loss (chronic): Secondary | ICD-10-CM

## 2024-03-04 DIAGNOSIS — D509 Iron deficiency anemia, unspecified: Secondary | ICD-10-CM | POA: Diagnosis not present
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
## 2024-03-04 NOTE — Patient Instructions (Signed)
-  We will update some labs in regards to your liver -You are up to date on liver imaging which looked stable in December -Continue with using fiber for your constipation -Increase water intake, aim for atleast 64 oz per day -Increase fruits, veggies and whole grains, kiwi and prunes are especially good for constipation - Reduce salt intake to <2 g per day - Can take Tylenol max of 2 g per day (650 mg q8h) for pain - Avoid NSAIDs for pain - Avoid eating raw oysters/shellfish - Ensure every night before going to sleep  Follow up 6 months

## 2024-03-17 LAB — PROTIME-INR
INR: 1 (ref 0.9–1.2)
Prothrombin Time: 10.9 s (ref 9.1–12.0)

## 2024-03-17 LAB — HEPATITIS B SURFACE ANTIBODY,QUALITATIVE: Hep B Surface Ab, Qual: NONREACTIVE

## 2024-03-17 LAB — HEPATITIS A ANTIBODY, TOTAL: hep A Total Ab: NEGATIVE

## 2024-03-17 LAB — AFP TUMOR MARKER: AFP, Serum, Tumor Marker: <1.8 ng/mL (ref 0.0–8.4)

## 2024-03-18 ENCOUNTER — Encounter (INDEPENDENT_AMBULATORY_CARE_PROVIDER_SITE_OTHER): Payer: Self-pay

## 2024-03-23 ENCOUNTER — Telehealth (INDEPENDENT_AMBULATORY_CARE_PROVIDER_SITE_OTHER): Payer: Self-pay | Admitting: Gastroenterology

## 2024-03-23 NOTE — Telephone Encounter (Signed)
 Pt returning call regarding his results. [phone]

## 2024-03-24 NOTE — Telephone Encounter (Signed)
 Tried calling the patient and the number he left was busy. I left a detailed message, on alternate number letting him know the reason we had treid to reach him was to let him know of his lab results and that he needed to reach his pcp regarding having Hep A and B vaccines done by PCP. I asked that he call the office and let us  know that he had received this message. I had also mailed this information to him in a letter on March 18, 2024.

## 2024-03-24 NOTE — Telephone Encounter (Signed)
 Patient returning call after receiving this letter.  March 18, 2024     KASEAN DENHERDER [address] [place] [address]     Dear Mr. Deguire,   We have tried calling you multiple times and have left a voice messages asking that you please call the office. Per Linde Reveal, Np here at the office. Your blood work the INR and AFP look good. Will repeat these tests in 6 months. You do not have immunity to hepatitis A or B, should discuss vaccines for these viruses with your primary care doctor.    If you have any questions or concerns, please don't hesitate to call.   Sincerely,       Marios Gaiser

## 2024-04-13 ENCOUNTER — Encounter: Payer: Self-pay | Admitting: Hematology

## 2024-05-17 ENCOUNTER — Encounter (INDEPENDENT_AMBULATORY_CARE_PROVIDER_SITE_OTHER): Payer: Self-pay | Admitting: *Deleted

## 2024-05-19 ENCOUNTER — Encounter: Payer: Self-pay | Admitting: Hematology

## 2024-05-21 ENCOUNTER — Other Ambulatory Visit (INDEPENDENT_AMBULATORY_CARE_PROVIDER_SITE_OTHER): Payer: Self-pay | Admitting: Vascular Surgery

## 2024-05-21 DIAGNOSIS — I739 Peripheral vascular disease, unspecified: Secondary | ICD-10-CM

## 2024-05-21 DIAGNOSIS — S91002A Unspecified open wound, left ankle, initial encounter: Secondary | ICD-10-CM

## 2024-05-26 ENCOUNTER — Encounter (INDEPENDENT_AMBULATORY_CARE_PROVIDER_SITE_OTHER): Payer: Self-pay | Admitting: Nurse Practitioner

## 2024-05-26 ENCOUNTER — Ambulatory Visit (INDEPENDENT_AMBULATORY_CARE_PROVIDER_SITE_OTHER): Admitting: Nurse Practitioner

## 2024-05-26 ENCOUNTER — Other Ambulatory Visit (INDEPENDENT_AMBULATORY_CARE_PROVIDER_SITE_OTHER): Payer: Self-pay | Admitting: Nurse Practitioner

## 2024-05-26 ENCOUNTER — Ambulatory Visit (INDEPENDENT_AMBULATORY_CARE_PROVIDER_SITE_OTHER)

## 2024-05-26 VITALS — BP 134/71 | HR 72 | Resp 18 | Ht 70.5 in | Wt 160.4 lb

## 2024-05-26 DIAGNOSIS — Z9889 Other specified postprocedural states: Secondary | ICD-10-CM

## 2024-05-26 DIAGNOSIS — R6889 Other general symptoms and signs: Secondary | ICD-10-CM

## 2024-05-26 DIAGNOSIS — N1832 Chronic kidney disease, stage 3b: Secondary | ICD-10-CM | POA: Diagnosis not present

## 2024-05-26 DIAGNOSIS — Z720 Tobacco use: Secondary | ICD-10-CM | POA: Diagnosis not present

## 2024-05-26 DIAGNOSIS — S91002A Unspecified open wound, left ankle, initial encounter: Secondary | ICD-10-CM

## 2024-05-26 DIAGNOSIS — I739 Peripheral vascular disease, unspecified: Secondary | ICD-10-CM | POA: Diagnosis not present

## 2024-05-26 DIAGNOSIS — I7025 Atherosclerosis of native arteries of other extremities with ulceration: Secondary | ICD-10-CM
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
## 2024-05-28 ENCOUNTER — Telehealth (INDEPENDENT_AMBULATORY_CARE_PROVIDER_SITE_OTHER): Payer: Self-pay

## 2024-05-28 LAB — VAS US ABI WITH/WO TBI
Left ABI: 0.43
Right ABI: 0.5

## 2024-05-28 NOTE — Telephone Encounter (Signed)
 Spoke with the patient and he is scheduled with Dr. Vonna Guardian for a LLE angio (06/03/24- 8:30 am), RLE angio (06/17/24 - 12:00 pm) at the [facility]. Pre-procedure instructions were discussed and will be mailed.

## 2024-06-03 ENCOUNTER — Encounter
Admission: RE | Disposition: A | Discharge status: Home / Self Care | Payer: Self-pay | Source: Home / Self Care | Attending: Vascular Surgery

## 2024-06-03 ENCOUNTER — Ambulatory Visit
Admission: RE | Admit: 2024-06-03 | Discharge: 2024-06-03 | Disposition: A | Discharge status: Home / Self Care | Attending: Vascular Surgery | Admitting: Vascular Surgery

## 2024-06-03 ENCOUNTER — Encounter: Payer: Self-pay | Admitting: Vascular Surgery

## 2024-06-03 DIAGNOSIS — Z5986 Financial insecurity: Secondary | ICD-10-CM | POA: Diagnosis not present

## 2024-06-03 DIAGNOSIS — L97929 Non-pressure chronic ulcer of unspecified part of left lower leg with unspecified severity: Secondary | ICD-10-CM | POA: Diagnosis not present

## 2024-06-03 DIAGNOSIS — I70243 Atherosclerosis of native arteries of left leg with ulceration of ankle: Secondary | ICD-10-CM | POA: Insufficient documentation

## 2024-06-03 DIAGNOSIS — Z7722 Contact with and (suspected) exposure to environmental tobacco smoke (acute) (chronic): Secondary | ICD-10-CM | POA: Diagnosis not present

## 2024-06-03 DIAGNOSIS — L97909 Non-pressure chronic ulcer of unspecified part of unspecified lower leg with unspecified severity: Secondary | ICD-10-CM

## 2024-06-03 DIAGNOSIS — I70249 Atherosclerosis of native arteries of left leg with ulceration of unspecified site: Secondary | ICD-10-CM | POA: Diagnosis not present

## 2024-06-03 DIAGNOSIS — I70211 Atherosclerosis of native arteries of extremities with intermittent claudication, right leg: Secondary | ICD-10-CM | POA: Diagnosis not present

## 2024-06-03 DIAGNOSIS — L97329 Non-pressure chronic ulcer of left ankle with unspecified severity: Secondary | ICD-10-CM | POA: Diagnosis not present

## 2024-06-03 DIAGNOSIS — N1832 Chronic kidney disease, stage 3b: Secondary | ICD-10-CM | POA: Diagnosis not present

## 2024-06-03 DIAGNOSIS — Z720 Tobacco use: Secondary | ICD-10-CM | POA: Diagnosis not present

## 2024-06-03 HISTORY — PX: LOWER EXTREMITY ANGIOGRAPHY: CATH118251

## 2024-06-03 LAB — CREATININE, SERUM
Creatinine, Ser: 2.09 mg/dL — ABNORMAL HIGH (ref 0.61–1.24)
GFR, Estimated: 32 mL/min — ABNORMAL LOW (ref >60)

## 2024-06-03 LAB — BUN: BUN: 32 mg/dL — ABNORMAL HIGH (ref 8–23)

## 2024-06-03 SURGERY — LOWER EXTREMITY ANGIOGRAPHY
Anesthesia: Moderate Sedation | Site: Leg Lower | Laterality: Left

## 2024-06-03 MED ORDER — VANCOMYCIN HCL IN DEXTROSE 1-5 GM/200ML-% IV SOLN
INTRAVENOUS | Status: AC
  Filled 2024-06-03: qty 200

## 2024-06-03 MED ORDER — METHYLPREDNISOLONE SODIUM SUCC 125 MG IJ SOLR: 125.0000 mg | Freq: Once | INTRAMUSCULAR | Status: DC | PRN

## 2024-06-03 MED ORDER — FENTANYL CITRATE (PF) 100 MCG/2ML IJ SOLN
INTRAMUSCULAR | Status: AC
  Filled 2024-06-03: qty 2

## 2024-06-03 MED ORDER — MIDAZOLAM HCL 2 MG/2ML IJ SOLN
INTRAMUSCULAR | Status: DC | PRN
  Administered 2024-06-03: .5 mg via INTRAVENOUS
  Administered 2024-06-03: 2 mg via INTRAVENOUS
  Administered 2024-06-03: .5 mg via INTRAVENOUS

## 2024-06-03 MED ORDER — CLOPIDOGREL BISULFATE 75 MG PO TABS: 75.0000 mg | ORAL_TABLET | Freq: Every day | ORAL | 11 refills | Status: AC

## 2024-06-03 MED ORDER — CLOPIDOGREL BISULFATE 75 MG PO TABS: 75.0000 mg | ORAL_TABLET | Freq: Every day | ORAL | Status: DC

## 2024-06-03 MED ORDER — HYDROMORPHONE HCL 1 MG/ML IJ SOLN: 1.0000 mg | Freq: Once | INTRAMUSCULAR | Status: DC | PRN

## 2024-06-03 MED ORDER — LABETALOL HCL 5 MG/ML IV SOLN: 10.0000 mg | INTRAVENOUS | Status: DC | PRN

## 2024-06-03 MED ORDER — SODIUM CHLORIDE 0.9 % IV SOLN: INTRAVENOUS | Status: DC

## 2024-06-03 MED ORDER — HEPARIN (PORCINE) IN NACL 1000-0.9 UT/500ML-% IV SOLN
INTRAVENOUS | Status: DC | PRN
  Administered 2024-06-03: 1000 mL

## 2024-06-03 MED ORDER — ACETAMINOPHEN 325 MG PO TABS
650.0000 mg | ORAL_TABLET | ORAL | Status: DC | PRN
Start: 2024-06-03 — End: 2024-06-03

## 2024-06-03 MED ORDER — SODIUM CHLORIDE 0.9% FLUSH: 3.0000 mL | INTRAVENOUS | Status: DC | PRN

## 2024-06-03 MED ORDER — VANCOMYCIN HCL IN DEXTROSE 1-5 GM/200ML-% IV SOLN
1000.0000 mg | INTRAVENOUS | Status: DC
Start: 2024-06-03 — End: 2024-06-03

## 2024-06-03 MED ORDER — FAMOTIDINE 20 MG PO TABS: 40.0000 mg | ORAL_TABLET | Freq: Once | ORAL | Status: DC | PRN

## 2024-06-03 MED ORDER — FENTANYL CITRATE (PF) 100 MCG/2ML IJ SOLN
INTRAMUSCULAR | Status: DC | PRN
  Administered 2024-06-03 (×2): 25 ug via INTRAVENOUS
  Administered 2024-06-03: 50 ug via INTRAVENOUS

## 2024-06-03 MED ORDER — MIDAZOLAM HCL 5 MG/5ML IJ SOLN
INTRAMUSCULAR | Status: AC
  Filled 2024-06-03: qty 5

## 2024-06-03 MED ORDER — DIPHENHYDRAMINE HCL 50 MG/ML IJ SOLN
INTRAMUSCULAR | Status: AC
Start: 2024-06-03 — End: 2024-06-03
  Filled 2024-06-03: qty 1

## 2024-06-03 MED ORDER — HEPARIN SODIUM (PORCINE) 1000 UNIT/ML IJ SOLN
INTRAMUSCULAR | Status: AC
  Filled 2024-06-03: qty 10

## 2024-06-03 MED ORDER — SODIUM CHLORIDE 0.9% FLUSH: 3.0000 mL | Freq: Two times a day (BID) | INTRAVENOUS | Status: DC

## 2024-06-03 MED ORDER — MIDAZOLAM HCL 2 MG/ML PO SYRP: 8.0000 mg | ORAL_SOLUTION | Freq: Once | ORAL | Status: DC | PRN

## 2024-06-03 MED ORDER — IODIXANOL 320 MG/ML IV SOLN
INTRAVENOUS | Status: DC | PRN
  Administered 2024-06-03: 30 mL via INTRA_ARTERIAL

## 2024-06-03 MED ORDER — HYDRALAZINE HCL 20 MG/ML IJ SOLN: 5.0000 mg | INTRAMUSCULAR | Status: DC | PRN

## 2024-06-03 MED ORDER — LIDOCAINE-EPINEPHRINE (PF) 1 %-1:200000 IJ SOLN
INTRAMUSCULAR | Status: DC | PRN
  Administered 2024-06-03: 10 mL via INTRADERMAL

## 2024-06-03 MED ORDER — SODIUM CHLORIDE 0.9 % IV SOLN: 250.0000 mL | INTRAVENOUS | Status: DC | PRN

## 2024-06-03 MED ORDER — ONDANSETRON HCL 4 MG/2ML IJ SOLN: 4.0000 mg | Freq: Four times a day (QID) | INTRAMUSCULAR | Status: DC | PRN

## 2024-06-03 MED ORDER — DIPHENHYDRAMINE HCL 50 MG/ML IJ SOLN
INTRAMUSCULAR | Status: DC | PRN
  Administered 2024-06-03: 25 mg via INTRAVENOUS

## 2024-06-03 MED ORDER — DIPHENHYDRAMINE HCL 50 MG/ML IJ SOLN
50.0000 mg | Freq: Once | INTRAMUSCULAR | Status: DC | PRN
Start: 2024-06-03 — End: 2024-06-03

## 2024-06-03 SURGICAL SUPPLY — 16 items
BALLOON LUTONIX DCB 6X60X130 (BALLOONS) IMPLANT
CATH ANGIO 5F PIGTAIL 65CM (CATHETERS) IMPLANT
COVER PROBE ULTRASOUND 5X96 (MISCELLANEOUS) IMPLANT
DEVICE PRESTO INFLATION (MISCELLANEOUS) IMPLANT
DEVICE STARCLOSE SE CLOSURE (Vascular Products) IMPLANT
GLIDEWIRE ADV .035X260CM (WIRE) IMPLANT
KIT MICROPUNCTURE VSI 5F STIFF (SHEATH) IMPLANT
NDL ENTRY 21GA 7CM ECHOTIP (NEEDLE) IMPLANT
NEEDLE ENTRY 21GA 7CM ECHOTIP (NEEDLE) ×1 IMPLANT
PACK ANGIOGRAPHY (CUSTOM PROCEDURE TRAY) ×1 IMPLANT
SHEATH BRITE TIP 5FRX11 (SHEATH) IMPLANT
SYR MEDRAD MARK 7 150ML (SYRINGE) IMPLANT
TUBING CONTRAST HIGH PRESS 72 (TUBING) IMPLANT
WIRE J 3MM .035X145CM (WIRE) IMPLANT
WIRE NITINOL .018 (WIRE) IMPLANT
WIRE SUPRACORE 300CM (WIRE) IMPLANT

## 2024-06-03 NOTE — Op Note (Signed)
 [facility]  Percutaneous Study/Intervention Procedural Note   Date of Surgery: 06/03/2024  Surgeon(s):Zyen Triggs    Assistants:none  Pre-operative Diagnosis: PAD with ulceration LLE, disabling claudication BLE  Post-operative diagnosis:  Same  Procedure(s) Performed:             1.  Ultrasound guidance for vascular access right femoral artery             2.  Catheter placement into left common femoral artery from right femoral approach             3.  Aortogram and selective left lower extremity angiogram             4.  Percutaneous transluminal angioplasty of left external iliac artery with 6 mm diameter by 6 cm length Lutonix drug-coated angioplasty balloon             5.  StarClose closure device right femoral artery  EBL: 10 cc  Contrast: 30 cc  Fluoro Time: 3.8 minutes  Moderate Conscious Sedation Time: approximately 34 minutes using 3 mg of Versed  and 100 mcg of Fentanyl               Indications:  Patient is a 78 y.o.male with poorly healing ulceration in the left lower extremity and disabling claudication symptoms bilaterally. The patient has noninvasive study showing markedly reduced ABIs bilaterally of 0.5 or less. The patient is brought in for angiography for further evaluation and potential treatment.  Due to the limb threatening nature of the situation, angiogram was performed for attempted limb salvage. The patient is aware that if the procedure fails, amputation would be expected.  The patient also understands that even with successful revascularization, amputation may still be required due to the severity of the situation.  Risks and benefits are discussed and informed consent is obtained.   Procedure:  The patient was identified and appropriate procedural time out was performed.  The patient was then placed supine on the table and prepped and draped in the usual sterile fashion. Moderate conscious sedation was administered during a face to face  encounter with the patient throughout the procedure with my supervision of the RN administering medicines and monitoring the patient's vital signs, pulse oximetry, telemetry and mental status throughout from the start of the procedure until the patient was taken to the recovery room. Ultrasound was used to evaluate the right common femoral artery.  It was occluded in the distal segment.  It was diseased but patent in the more proximal common femoral artery.  A digital ultrasound image was acquired.  A micropuncture needle was used to access the proximal right common femoral artery under direct ultrasound guidance and a permanent image was performed.  A micropuncture wire and sheath were placed.  A 0.035 J wire was advanced without resistance and a 5Fr sheath was placed.  Pigtail catheter was placed into the aorta and an AP aortogram was performed. This demonstrated normal renal arteries.  The aorta was fairly normal.  Both common iliac arteries had stenosis in the 50 to 60% range.  The left external iliac artery origin had a greater than 95% stenosis. I then crossed the aortic bifurcation and advanced to the left femoral head. Selective left lower extremity angiogram was then performed. This demonstrated heavily diseased left common femoral artery with greater than 80% stenosis going into the origin of the profunda femoris artery which was also heavily diseased proximally.  The SFA was occluded throughout its course with reconstitution  of the above-knee popliteal artery.  There appeared to be two-vessel runoff distally, but the flow was quite sluggish and difficult to discern.  It was clear the patient was given a bilateral femoral endarterectomies and a hybrid procedure to address some of his other disease, but I was fearful due to the string sign left external iliac artery stenosis with poor outflow and no hypogastric artery that this with thrombosed and he would be an acute limb threatening situation.  I elected  to angioplasty this area with a 6 mm diameter by 6 cm length Lutonix drug-coated angioplasty balloon inflated to 12 atm for 1 minute.  Completion imaging showed significant improvement with borderline 50% residual stenosis.  I elected to terminate the procedure. The sheath was removed and StarClose closure device was deployed in the right femoral artery with excellent hemostatic result. The patient was taken to the recovery room in stable condition having tolerated the procedure well.  Findings:               Aortogram:  This demonstrated normal renal arteries.  The aorta was fairly normal.  Both common iliac arteries had stenosis in the 50 to 60% range.  The left external iliac artery origin had a greater than 95% stenosis.             Left Lower Extremity:  This demonstrated heavily diseased left common femoral artery with greater than 80% stenosis going into the origin of the profunda femoris artery which was also heavily diseased proximally.  The SFA was occluded throughout its course with reconstitution of the above-knee popliteal artery.  There appeared to be two-vessel runoff distally, but the flow was quite sluggish and difficult to discern.   Disposition: Patient was taken to the recovery room in stable condition having tolerated the procedure well.  Complications: None  Selinda Gu 06/03/2024 10:18 AM   This note was created with Dragon Medical transcription system. Any errors in dictation are purely unintentional.

## 2024-06-03 NOTE — Interval H&P Note (Signed)
 History and Physical Interval Note:  06/03/2024 9:07 AM  Jeffery Ponce  has presented today for surgery, with the diagnosis of LLE Angio    ASO w ulceration.  The various methods of treatment have been discussed with the patient and family. After consideration of risks, benefits and other options for treatment, the patient has consented to  Procedure(s): Lower Extremity Angiography (Left) as a surgical intervention.  The patient's history has been reviewed, patient examined, no change in status, stable for surgery.  I have reviewed the patient's chart and labs.  Questions were answered to the patient's satisfaction.     Makiyla Linch

## 2024-06-04 ENCOUNTER — Other Ambulatory Visit (INDEPENDENT_AMBULATORY_CARE_PROVIDER_SITE_OTHER): Payer: Self-pay

## 2024-06-04 ENCOUNTER — Telehealth (INDEPENDENT_AMBULATORY_CARE_PROVIDER_SITE_OTHER): Payer: Self-pay

## 2024-06-04 NOTE — Telephone Encounter (Signed)
 I attempted to contact the patient to give him information regarding his bilateral femoral endarterectomy and bilateral iliac stents and possible left SFA stent on 06/23/24 wit Dr. Marea. A message was left for a return call.

## 2024-06-06 ENCOUNTER — Encounter: Payer: Self-pay | Admitting: Urgent Care

## 2024-06-07 ENCOUNTER — Other Ambulatory Visit (INDEPENDENT_AMBULATORY_CARE_PROVIDER_SITE_OTHER): Payer: Self-pay | Admitting: Nurse Practitioner

## 2024-06-07 ENCOUNTER — Other Ambulatory Visit (INDEPENDENT_AMBULATORY_CARE_PROVIDER_SITE_OTHER): Payer: Self-pay | Admitting: *Deleted

## 2024-06-07 ENCOUNTER — Telehealth (INDEPENDENT_AMBULATORY_CARE_PROVIDER_SITE_OTHER): Payer: Self-pay | Admitting: *Deleted

## 2024-06-07 DIAGNOSIS — I70213 Atherosclerosis of native arteries of extremities with intermittent claudication, bilateral legs: Secondary | ICD-10-CM

## 2024-06-07 DIAGNOSIS — K7460 Unspecified cirrhosis of liver: Secondary | ICD-10-CM

## 2024-06-07 NOTE — Telephone Encounter (Signed)
 [facility] appt for US  on  Wednesday 06/16/24, arrive at 10:15 am to check in at [facility], NPO 8 hours prior. If any questions to please call us  back.   Pt left vm stating he received letter stating it was time to schedule US .

## 2024-06-10 ENCOUNTER — Other Ambulatory Visit: Payer: Self-pay

## 2024-06-10 ENCOUNTER — Other Ambulatory Visit (INDEPENDENT_AMBULATORY_CARE_PROVIDER_SITE_OTHER): Payer: Self-pay | Admitting: Nurse Practitioner

## 2024-06-10 ENCOUNTER — Encounter
Admission: RE | Admit: 2024-06-10 | Discharge: 2024-06-10 | Disposition: A | Discharge status: Home / Self Care | Source: Ambulatory Visit | Attending: Vascular Surgery | Admitting: Vascular Surgery

## 2024-06-10 DIAGNOSIS — K7460 Unspecified cirrhosis of liver: Secondary | ICD-10-CM | POA: Insufficient documentation

## 2024-06-10 DIAGNOSIS — N184 Chronic kidney disease, stage 4 (severe): Secondary | ICD-10-CM | POA: Insufficient documentation

## 2024-06-10 DIAGNOSIS — I70213 Atherosclerosis of native arteries of extremities with intermittent claudication, bilateral legs: Secondary | ICD-10-CM | POA: Diagnosis not present

## 2024-06-10 DIAGNOSIS — Z01818 Encounter for other preprocedural examination: Secondary | ICD-10-CM | POA: Insufficient documentation

## 2024-06-10 DIAGNOSIS — Z01812 Encounter for preprocedural laboratory examination: Secondary | ICD-10-CM

## 2024-06-10 DIAGNOSIS — Z01810 Encounter for preprocedural cardiovascular examination: Secondary | ICD-10-CM | POA: Diagnosis not present

## 2024-06-10 HISTORY — DX: Gastrointestinal hemorrhage, unspecified: K92.2

## 2024-06-10 HISTORY — DX: Gastro-esophageal reflux disease without esophagitis: K21.9

## 2024-06-10 HISTORY — DX: Long term (current) use of antithrombotics/antiplatelets: Z79.02

## 2024-06-10 HISTORY — DX: Personal history of other medical treatment: Z92.89

## 2024-06-10 HISTORY — DX: Iron deficiency anemia, unspecified: D50.9

## 2024-06-10 HISTORY — DX: Essential (primary) hypertension: I10

## 2024-06-10 HISTORY — DX: Chronic obstructive pulmonary disease, unspecified: J44.9

## 2024-06-10 HISTORY — DX: Pneumonia, unspecified organism: J18.9

## 2024-06-10 HISTORY — DX: Personal history of nicotine dependence: Z87.891

## 2024-06-10 HISTORY — DX: Atherosclerosis of native arteries of extremities with intermittent claudication, unspecified extremity: I70.219

## 2024-06-10 HISTORY — DX: Chronic kidney disease, stage 4 (severe): N18.4

## 2024-06-10 HISTORY — DX: Secondary hyperparathyroidism of renal origin: N25.81

## 2024-06-10 HISTORY — DX: Unspecified cirrhosis of liver: K74.60

## 2024-06-10 HISTORY — DX: Other specified postprocedural states: Z98.890

## 2024-06-10 LAB — COMPREHENSIVE METABOLIC PANEL WITH GFR
ALT: 7 U/L (ref 0–44)
AST: 11 U/L — ABNORMAL LOW (ref 15–41)
Albumin: 3.9 g/dL (ref 3.5–5.0)
Alkaline Phosphatase: 108 U/L (ref 38–126)
Anion gap: 6 (ref 5–15)
BUN: 41 mg/dL — ABNORMAL HIGH (ref 8–23)
CO2: 23 mmol/L (ref 22–32)
Calcium: 9.1 mg/dL (ref 8.9–10.3)
Chloride: 110 mmol/L (ref 98–111)
Creatinine, Ser: 2.35 mg/dL — ABNORMAL HIGH (ref 0.61–1.24)
GFR, Estimated: 28 mL/min — ABNORMAL LOW (ref >60)
Glucose, Bld: 100 mg/dL — ABNORMAL HIGH (ref 70–99)
Potassium: 5.3 mmol/L — ABNORMAL HIGH (ref 3.5–5.1)
Sodium: 139 mmol/L (ref 135–145)
Total Bilirubin: 0.4 mg/dL (ref 0.0–1.2)
Total Protein: 7.2 g/dL (ref 6.5–8.1)

## 2024-06-10 LAB — CBC WITH DIFFERENTIAL/PLATELET
Abs Immature Granulocytes: 0.05 10*3/uL (ref 0.00–0.07)
Basophils Absolute: 0 10*3/uL (ref 0.0–0.1)
Basophils Relative: 1 %
Eosinophils Absolute: 0.2 10*3/uL (ref 0.0–0.5)
Eosinophils Relative: 2 %
HCT: 35.1 % — ABNORMAL LOW (ref 39.0–52.0)
Hemoglobin: 11.1 g/dL — ABNORMAL LOW (ref 13.0–17.0)
Immature Granulocytes: 1 %
Lymphocytes Relative: 16 %
Lymphs Abs: 1.4 10*3/uL (ref 0.7–4.0)
MCH: 29.1 pg (ref 26.0–34.0)
MCHC: 31.6 g/dL (ref 30.0–36.0)
MCV: 92.1 fL (ref 80.0–100.0)
Monocytes Absolute: 0.5 10*3/uL (ref 0.1–1.0)
Monocytes Relative: 6 %
Neutro Abs: 6.3 10*3/uL (ref 1.7–7.7)
Neutrophils Relative %: 74 %
Platelets: 284 10*3/uL (ref 150–400)
RBC: 3.81 MIL/uL — ABNORMAL LOW (ref 4.22–5.81)
RDW: 13.1 % (ref 11.5–15.5)
WBC: 8.5 10*3/uL (ref 4.0–10.5)
nRBC: 0 % (ref 0.0–0.2)

## 2024-06-10 LAB — SURGICAL PCR SCREEN
MRSA, PCR: NEGATIVE
Staphylococcus aureus: NEGATIVE

## 2024-06-10 NOTE — Patient Instructions (Addendum)
 Your procedure is scheduled on:06-23-24 Wednesday Report to the Registration Desk on the 1st floor of the Medical Mall.Then proceed to the 2nd floor Surgery Desk To find out your arrival time, please call [phone] between 1PM - 3PM on:06-22-24 Tuesday If your arrival time is 6:00 am, do not arrive before that time as the Medical Mall entrance doors do not open until 6:00 am.  REMEMBER: Instructions that are not followed completely may result in serious medical risk, up to and including death; or upon the discretion of your surgeon and anesthesiologist your surgery may need to be rescheduled.  Do not eat food OR drink liquids after midnight the night before surgery.  No gum chewing or hard candies.  One week prior to surgery:Last dose will be on 06-15-24 Stop Anti-inflammatories (NSAIDS) such as Advil, Aleve, Ibuprofen, Motrin, Naproxen, Naprosyn and Aspirin  based products such as Excedrin, Goody's Powder, BC Powder. Stop ANY OVER THE COUNTER supplements until after surgery (Vitamin D )  You may however, continue to take Tylenol /Oxycodone  if needed for pain up until the day of surgery.  Stop clopidogrel  (PLAVIX ) 5 days prior to surgery-Last dose will be on 06-17-24 Thursday  Stop FARXIGA 3 days prior to surgery-Last dose will be on 06-19-24 Saturday  Continue taking all of your other prescription medications up until the day of surgery.  ON THE DAY OF SURGERY ONLY TAKE THESE MEDICATIONS WITH SIPS OF WATER : -amLODipine  (NORVASC )  -morphine  (MSIR)  -sodium bicarbonate    Start taking your 81 mg Aspirin  everyday starting 06-18-24 (Friday) and take up until the day prior to surgery-Do NOT take the morning of surgery  Use your Albuterol  Inhaler the day of surgery and bring your Inhaler to the hospital  No Alcohol for 24 hours before or after surgery.  No Smoking including e-cigarettes for 24 hours before surgery.  No chewable tobacco products for at least 6 hours before surgery.  No  nicotine  patches on the day of surgery.  Do not use any recreational drugs for at least a week (preferably 2 weeks) before your surgery.  Please be advised that the combination of cocaine and anesthesia may have negative outcomes, up to and including death. If you test positive for cocaine, your surgery will be cancelled.  On the morning of surgery brush your teeth with toothpaste and water , you may rinse your mouth with mouthwash if you wish. Do not swallow any toothpaste or mouthwash.  Use CHG Soap as directed on instruction sheet.  Do not wear jewelry, make-up, hairpins, clips or nail polish.  For welded (permanent) jewelry: bracelets, anklets, waist bands, etc.  Please have this removed prior to surgery.  If it is not removed, there is a chance that hospital personnel will need to cut it off on the day of surgery.  Do not wear lotions, powders, or perfumes.   Do not shave body hair from the neck down 48 hours before surgery.  Contact lenses, hearing aids and dentures may not be worn into surgery.  Do not bring valuables to the hospital. [facility] is not responsible for any missing/lost belongings or valuables.   Notify your doctor if there is any change in your medical condition (cold, fever, infection).  Wear comfortable clothing (specific to your surgery type) to the hospital.  After surgery, you can help prevent lung complications by doing breathing exercises.  Take deep breaths and cough every 1-2 hours. Your doctor may order a device called an Incentive Spirometer to help you take deep breaths. When  coughing or sneezing, hold a pillow firmly against your incision with both hands. This is called "splinting." Doing this helps protect your incision. It also decreases belly discomfort.  If you are being admitted to the hospital overnight, leave your suitcase in the car. After surgery it may be brought to your room.  In case of increased patient census, it may be necessary  for you, the patient, to continue your postoperative care in the Same Day Surgery department.  If you are being discharged the day of surgery, you will not be allowed to drive home. You will need a responsible individual to drive you home and stay with you for 24 hours after surgery.   If you are taking public transportation, you will need to have a responsible individual with you.  Please call the Pre-admissions Testing Dept. at [phone] if you have any questions about these instructions.  Surgery Visitation Policy:  Patients having surgery or a procedure may have two visitors.  Children under the age of 101 must have an adult with them who is not the patient.  Inpatient Visitation:    Visiting hours are 7 a.m. to 8 p.m. Up to four visitors are allowed at one time in a patient room. The visitors may rotate out with other people during the day.  One visitor age 29 or older may stay with the patient overnight and must be in the room by 8 p.m.     Preparing for Surgery with CHLORHEXIDINE  GLUCONATE (CHG) Soap  Chlorhexidine  Gluconate (CHG) Soap  o An antiseptic cleaner that kills germs and bonds with the skin to continue killing germs even after washing  o Used for showering the night before surgery and morning of surgery  Before surgery, you can play an important role by reducing the number of germs on your skin.  CHG (Chlorhexidine  gluconate) soap is an antiseptic cleanser which kills germs and bonds with the skin to continue killing germs even after washing.  Please do not use if you have an allergy to CHG or antibacterial soaps. If your skin becomes reddened/irritated stop using the CHG.  1. Shower the NIGHT BEFORE SURGERY and the MORNING OF SURGERY with CHG soap.  2. If you choose to wash your hair, wash your hair first as usual with your normal shampoo.  3. After shampooing, rinse your hair and body thoroughly to remove the shampoo.  4. Use CHG as you would any other  liquid soap. You can apply CHG directly to the skin and wash gently with a scrungie or a clean washcloth.  5. Apply the CHG soap to your body only from the neck down. Do not use on open wounds or open sores. Avoid contact with your eyes, ears, mouth, and genitals (private parts). Wash face and genitals (private parts) with your normal soap.  6. Wash thoroughly, paying special attention to the area where your surgery will be performed.  7. Thoroughly rinse your body with warm water .  8. Do not shower/wash with your normal soap after using and rinsing off the CHG soap.  9. Pat yourself dry with a clean towel.  10. Wear clean pajamas to bed the night before surgery.  12. Place clean sheets on your bed the night of your first shower and do not sleep with pets.  13. Shower again with the CHG soap on the day of surgery prior to arriving at the hospital.  14. Do not apply any deodorants/lotions/powders.  15. Please wear clean clothes to the  hospital. [employer] to address health-related social needs:  [email or web address]

## 2024-06-10 NOTE — Progress Notes (Signed)
 Pt takes Plavix  75 mg daily and 81 mg Aspirin  3 times weekly. Pt having to stop Plavix  5 days prior to surgery per Dr Anitra instructions. Pt instructed once he takes his last dose of Plavix  on 06-17-24 (Thursday) he will need to start taking his 81 mg Aspirin  DAILY starting 06-18-24 (Friday) up until the day prior to surgery-Pt verbalized understanding

## 2024-06-16 ENCOUNTER — Ambulatory Visit (HOSPITAL_COMMUNITY): Attending: Gastroenterology

## 2024-06-17 ENCOUNTER — Ambulatory Visit: Admission: RE | Admit: 2024-06-17 | Source: Home / Self Care | Admitting: Vascular Surgery

## 2024-06-17 ENCOUNTER — Encounter: Admission: RE | Payer: Self-pay | Source: Home / Self Care

## 2024-06-17 DIAGNOSIS — L97909 Non-pressure chronic ulcer of unspecified part of unspecified lower leg with unspecified severity: Secondary | ICD-10-CM

## 2024-06-17 SURGERY — LOWER EXTREMITY ANGIOGRAPHY
Anesthesia: Moderate Sedation | Site: Leg Lower | Laterality: Right

## 2024-06-21 ENCOUNTER — Encounter: Payer: Self-pay | Admitting: Vascular Surgery
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
## 2024-06-23 ENCOUNTER — Other Ambulatory Visit: Payer: Self-pay

## 2024-06-23 ENCOUNTER — Encounter: Payer: Self-pay | Admitting: Vascular Surgery

## 2024-06-23 ENCOUNTER — Inpatient Hospital Stay: Payer: Self-pay | Admitting: Urgent Care

## 2024-06-23 ENCOUNTER — Inpatient Hospital Stay

## 2024-06-23 ENCOUNTER — Inpatient Hospital Stay
Admission: RE | Admit: 2024-06-23 | Discharge: 2024-07-02 | DRG: 252 | Disposition: A | Discharge status: Home / Self Care | Attending: Vascular Surgery | Admitting: Vascular Surgery

## 2024-06-23 ENCOUNTER — Encounter
Admission: RE | Disposition: A | Discharge status: Home / Self Care | Payer: Self-pay | Source: Home / Self Care | Attending: Vascular Surgery

## 2024-06-23 DIAGNOSIS — D6832 Hemorrhagic disorder due to extrinsic circulating anticoagulants: Secondary | ICD-10-CM | POA: Diagnosis not present

## 2024-06-23 DIAGNOSIS — Z8551 Personal history of malignant neoplasm of bladder: Secondary | ICD-10-CM

## 2024-06-23 DIAGNOSIS — K7460 Unspecified cirrhosis of liver: Secondary | ICD-10-CM | POA: Diagnosis present

## 2024-06-23 DIAGNOSIS — N2581 Secondary hyperparathyroidism of renal origin: Secondary | ICD-10-CM | POA: Diagnosis present

## 2024-06-23 DIAGNOSIS — Z7982 Long term (current) use of aspirin: Secondary | ICD-10-CM

## 2024-06-23 DIAGNOSIS — Z860101 Personal history of adenomatous and serrated colon polyps: Secondary | ICD-10-CM | POA: Diagnosis not present

## 2024-06-23 DIAGNOSIS — I700 Atherosclerosis of aorta: Secondary | ICD-10-CM

## 2024-06-23 DIAGNOSIS — L97819 Non-pressure chronic ulcer of other part of right lower leg with unspecified severity: Secondary | ICD-10-CM | POA: Diagnosis present

## 2024-06-23 DIAGNOSIS — L97919 Non-pressure chronic ulcer of unspecified part of right lower leg with unspecified severity: Secondary | ICD-10-CM

## 2024-06-23 DIAGNOSIS — L97909 Non-pressure chronic ulcer of unspecified part of unspecified lower leg with unspecified severity: Secondary | ICD-10-CM | POA: Diagnosis present

## 2024-06-23 DIAGNOSIS — Z79899 Other long term (current) drug therapy: Secondary | ICD-10-CM | POA: Diagnosis not present

## 2024-06-23 DIAGNOSIS — I70249 Atherosclerosis of native arteries of left leg with ulceration of unspecified site: Secondary | ICD-10-CM

## 2024-06-23 DIAGNOSIS — I129 Hypertensive chronic kidney disease with stage 1 through stage 4 chronic kidney disease, or unspecified chronic kidney disease: Secondary | ICD-10-CM | POA: Diagnosis present

## 2024-06-23 DIAGNOSIS — K31811 Angiodysplasia of stomach and duodenum with bleeding: Secondary | ICD-10-CM | POA: Diagnosis present

## 2024-06-23 DIAGNOSIS — I70238 Atherosclerosis of native arteries of right leg with ulceration of other part of lower right leg: Secondary | ICD-10-CM | POA: Diagnosis present

## 2024-06-23 DIAGNOSIS — I70248 Atherosclerosis of native arteries of left leg with ulceration of other part of lower left leg: Secondary | ICD-10-CM | POA: Diagnosis present

## 2024-06-23 DIAGNOSIS — E1122 Type 2 diabetes mellitus with diabetic chronic kidney disease: Secondary | ICD-10-CM | POA: Diagnosis present

## 2024-06-23 DIAGNOSIS — I440 Atrioventricular block, first degree: Secondary | ICD-10-CM | POA: Diagnosis present

## 2024-06-23 DIAGNOSIS — I10 Essential (primary) hypertension: Secondary | ICD-10-CM | POA: Diagnosis not present

## 2024-06-23 DIAGNOSIS — Z885 Allergy status to narcotic agent status: Secondary | ICD-10-CM

## 2024-06-23 DIAGNOSIS — K31819 Angiodysplasia of stomach and duodenum without bleeding: Secondary | ICD-10-CM

## 2024-06-23 DIAGNOSIS — I70223 Atherosclerosis of native arteries of extremities with rest pain, bilateral legs: Secondary | ICD-10-CM

## 2024-06-23 DIAGNOSIS — E1151 Type 2 diabetes mellitus with diabetic peripheral angiopathy without gangrene: Principal | ICD-10-CM | POA: Diagnosis present

## 2024-06-23 DIAGNOSIS — Z800 Family history of malignant neoplasm of digestive organs: Secondary | ICD-10-CM

## 2024-06-23 DIAGNOSIS — I70239 Atherosclerosis of native arteries of right leg with ulceration of unspecified site: Secondary | ICD-10-CM

## 2024-06-23 DIAGNOSIS — D649 Anemia, unspecified: Secondary | ICD-10-CM | POA: Diagnosis not present

## 2024-06-23 DIAGNOSIS — K920 Hematemesis: Secondary | ICD-10-CM | POA: Diagnosis not present

## 2024-06-23 DIAGNOSIS — J449 Chronic obstructive pulmonary disease, unspecified: Secondary | ICD-10-CM | POA: Diagnosis present

## 2024-06-23 DIAGNOSIS — Z880 Allergy status to penicillin: Secondary | ICD-10-CM

## 2024-06-23 DIAGNOSIS — I2510 Atherosclerotic heart disease of native coronary artery without angina pectoris: Secondary | ICD-10-CM | POA: Diagnosis present

## 2024-06-23 DIAGNOSIS — L97829 Non-pressure chronic ulcer of other part of left lower leg with unspecified severity: Secondary | ICD-10-CM | POA: Diagnosis present

## 2024-06-23 DIAGNOSIS — K219 Gastro-esophageal reflux disease without esophagitis: Secondary | ICD-10-CM | POA: Diagnosis present

## 2024-06-23 DIAGNOSIS — Z87891 Personal history of nicotine dependence: Secondary | ICD-10-CM

## 2024-06-23 DIAGNOSIS — K921 Melena: Secondary | ICD-10-CM

## 2024-06-23 DIAGNOSIS — I7090 Unspecified atherosclerosis: Principal | ICD-10-CM

## 2024-06-23 DIAGNOSIS — N184 Chronic kidney disease, stage 4 (severe): Secondary | ICD-10-CM | POA: Diagnosis present

## 2024-06-23 DIAGNOSIS — Z01812 Encounter for preprocedural laboratory examination: Secondary | ICD-10-CM

## 2024-06-23 DIAGNOSIS — E7800 Pure hypercholesterolemia, unspecified: Secondary | ICD-10-CM | POA: Diagnosis present

## 2024-06-23 DIAGNOSIS — D62 Acute posthemorrhagic anemia: Secondary | ICD-10-CM | POA: Diagnosis not present

## 2024-06-23 DIAGNOSIS — D631 Anemia in chronic kidney disease: Secondary | ICD-10-CM | POA: Diagnosis present

## 2024-06-23 DIAGNOSIS — I70299 Other atherosclerosis of native arteries of extremities, unspecified extremity: Secondary | ICD-10-CM | POA: Diagnosis not present

## 2024-06-23 DIAGNOSIS — L97929 Non-pressure chronic ulcer of unspecified part of left lower leg with unspecified severity: Secondary | ICD-10-CM

## 2024-06-23 DIAGNOSIS — Z833 Family history of diabetes mellitus: Secondary | ICD-10-CM | POA: Diagnosis not present

## 2024-06-23 DIAGNOSIS — Z7902 Long term (current) use of antithrombotics/antiplatelets: Secondary | ICD-10-CM

## 2024-06-23 DIAGNOSIS — E875 Hyperkalemia: Secondary | ICD-10-CM

## 2024-06-23 DIAGNOSIS — Z881 Allergy status to other antibiotic agents status: Secondary | ICD-10-CM

## 2024-06-23 DIAGNOSIS — Z888 Allergy status to other drugs, medicaments and biological substances status: Secondary | ICD-10-CM

## 2024-06-23 DIAGNOSIS — Z5986 Financial insecurity: Secondary | ICD-10-CM

## 2024-06-23 HISTORY — DX: Other ill-defined heart diseases: I51.89

## 2024-06-23 HISTORY — DX: Diverticulosis of large intestine without perforation or abscess without bleeding: K57.30

## 2024-06-23 HISTORY — DX: Other nonrheumatic aortic valve disorders: I35.8

## 2024-06-23 HISTORY — DX: Long term (current) use of aspirin: Z79.82

## 2024-06-23 HISTORY — PX: APPLICATION OF CELL SAVER: SHX7529

## 2024-06-23 HISTORY — DX: Polyp of colon: K63.5

## 2024-06-23 HISTORY — PX: ENDARTERECTOMY FEMORAL: SHX5804

## 2024-06-23 HISTORY — DX: Atherosclerosis of aorta: I70.0

## 2024-06-23 HISTORY — DX: Anemia in chronic kidney disease: D63.1

## 2024-06-23 HISTORY — DX: Secondary hyperparathyroidism of renal origin: N25.81

## 2024-06-23 HISTORY — PX: INSERTION OF ILIAC STENT: SHX6256

## 2024-06-23 HISTORY — DX: Long term (current) use of opiate analgesic: Z79.891

## 2024-06-23 HISTORY — DX: Atherosclerotic heart disease of native coronary artery without angina pectoris: I25.10

## 2024-06-23 LAB — POCT I-STAT, CHEM 8
BUN: 38 mg/dL — ABNORMAL HIGH (ref 8–23)
Calcium, Ion: 1.25 mmol/L (ref 1.15–1.40)
Chloride: 113 mmol/L — ABNORMAL HIGH (ref 98–111)
Creatinine, Ser: 2.5 mg/dL — ABNORMAL HIGH (ref 0.61–1.24)
Glucose, Bld: 101 mg/dL — ABNORMAL HIGH (ref 70–99)
HCT: 28 % — ABNORMAL LOW (ref 39.0–52.0)
Hemoglobin: 9.5 g/dL — ABNORMAL LOW (ref 13.0–17.0)
Potassium: 4.4 mmol/L (ref 3.5–5.1)
Sodium: 142 mmol/L (ref 135–145)
TCO2: 17 mmol/L — ABNORMAL LOW (ref 22–32)

## 2024-06-23 LAB — GLUCOSE, CAPILLARY: Glucose-Capillary: 154 mg/dL — ABNORMAL HIGH (ref 70–99)

## 2024-06-23 LAB — CREATININE, SERUM
Creatinine, Ser: 2.27 mg/dL — ABNORMAL HIGH (ref 0.61–1.24)
GFR, Estimated: 29 mL/min — ABNORMAL LOW (ref >60)

## 2024-06-23 LAB — CBC
HCT: 25 % — ABNORMAL LOW (ref 39.0–52.0)
Hemoglobin: 7.3 g/dL — ABNORMAL LOW (ref 13.0–17.0)
MCH: 28.6 pg (ref 26.0–34.0)
MCHC: 29.2 g/dL — ABNORMAL LOW (ref 30.0–36.0)
MCV: 98 fL (ref 80.0–100.0)
Platelets: 262 K/uL (ref 150–400)
RBC: 2.55 MIL/uL — ABNORMAL LOW (ref 4.22–5.81)
RDW: 13.3 % (ref 11.5–15.5)
WBC: 16.7 K/uL — ABNORMAL HIGH (ref 4.0–10.5)
nRBC: 0 % (ref 0.0–0.2)

## 2024-06-23 LAB — PREPARE RBC (CROSSMATCH)

## 2024-06-23 LAB — MRSA NEXT GEN BY PCR, NASAL: MRSA by PCR Next Gen: NOT DETECTED

## 2024-06-23 SURGERY — ENDARTERECTOMY, FEMORAL
Anesthesia: General

## 2024-06-23 MED ORDER — FENTANYL CITRATE (PF) 100 MCG/2ML IJ SOLN
INTRAMUSCULAR | Status: AC
  Filled 2024-06-23: qty 2

## 2024-06-23 MED ORDER — PHENYLEPHRINE HCL-NACL 20-0.9 MG/250ML-% IV SOLN
INTRAVENOUS | Status: DC | PRN
  Administered 2024-06-23: 160 ug via INTRAVENOUS
  Administered 2024-06-23: 15 ug/min via INTRAVENOUS
  Administered 2024-06-23: 80 ug via INTRAVENOUS

## 2024-06-23 MED ORDER — CLOPIDOGREL BISULFATE 75 MG PO TABS
75.0000 mg | ORAL_TABLET | Freq: Every day | ORAL | Status: DC
  Administered 2024-06-24 – 2024-06-27 (×4): 75 mg via ORAL
  Filled 2024-06-23 (×4): qty 1

## 2024-06-23 MED ORDER — ENOXAPARIN SODIUM 30 MG/0.3ML IJ SOSY
30.0000 mg | PREFILLED_SYRINGE | INTRAMUSCULAR | Status: DC
  Administered 2024-06-24 – 2024-06-26 (×2): 30 mg via SUBCUTANEOUS
  Filled 2024-06-23 (×5): qty 0.3

## 2024-06-23 MED ORDER — CHLORHEXIDINE GLUCONATE 0.12 % MT SOLN
OROMUCOSAL | Status: AC
  Filled 2024-06-23: qty 15

## 2024-06-23 MED ORDER — SUGAMMADEX SODIUM 200 MG/2ML IV SOLN
INTRAVENOUS | Status: DC | PRN
  Administered 2024-06-23: 200 mg via INTRAVENOUS

## 2024-06-23 MED ORDER — ROCURONIUM BROMIDE 100 MG/10ML IV SOLN
INTRAVENOUS | Status: DC | PRN
  Administered 2024-06-23: 30 mg via INTRAVENOUS
  Administered 2024-06-23: 20 mg via INTRAVENOUS
  Administered 2024-06-23: 50 mg via INTRAVENOUS
  Administered 2024-06-23: 20 mg via INTRAVENOUS

## 2024-06-23 MED ORDER — SODIUM CHLORIDE 0.9 % IV SOLN
INTRAVENOUS | Status: DC | PRN
  Administered 2024-06-23: 501 mL

## 2024-06-23 MED ORDER — VANCOMYCIN HCL IN DEXTROSE 1-5 GM/200ML-% IV SOLN
INTRAVENOUS | Status: AC
  Filled 2024-06-23: qty 200

## 2024-06-23 MED ORDER — ACETAMINOPHEN 10 MG/ML IV SOLN
INTRAVENOUS | Status: AC
  Filled 2024-06-23: qty 100

## 2024-06-23 MED ORDER — OXYCODONE HCL 5 MG/5ML PO SOLN: 5.0000 mg | Freq: Once | ORAL | Status: DC | PRN

## 2024-06-23 MED ORDER — ROCURONIUM BROMIDE 10 MG/ML (PF) SYRINGE
PREFILLED_SYRINGE | INTRAVENOUS | Status: AC
  Filled 2024-06-23: qty 10

## 2024-06-23 MED ORDER — ONDANSETRON HCL 4 MG/2ML IJ SOLN
INTRAMUSCULAR | Status: AC
  Filled 2024-06-23: qty 2

## 2024-06-23 MED ORDER — CHLORHEXIDINE GLUCONATE CLOTH 2 % EX PADS
6.0000 | MEDICATED_PAD | Freq: Once | CUTANEOUS | Status: AC
  Administered 2024-06-23: 6 via TOPICAL

## 2024-06-23 MED ORDER — ONDANSETRON HCL 4 MG/2ML IJ SOLN: 4.0000 mg | Freq: Four times a day (QID) | INTRAMUSCULAR | Status: DC | PRN

## 2024-06-23 MED ORDER — LISINOPRIL 2.5 MG PO TABS
2.5000 mg | ORAL_TABLET | Freq: Every day | ORAL | Status: DC
  Administered 2024-06-24 – 2024-06-28 (×4): 2.5 mg via ORAL
  Filled 2024-06-23 (×5): qty 1

## 2024-06-23 MED ORDER — DEXAMETHASONE SODIUM PHOSPHATE 10 MG/ML IJ SOLN
INTRAMUSCULAR | Status: AC
  Filled 2024-06-23: qty 1

## 2024-06-23 MED ORDER — ALBUMIN HUMAN 5 % IV SOLN: INTRAVENOUS | Status: DC | PRN

## 2024-06-23 MED ORDER — DEXAMETHASONE SODIUM PHOSPHATE 10 MG/ML IJ SOLN
INTRAMUSCULAR | Status: DC | PRN
  Administered 2024-06-23: 10 mg via INTRAVENOUS

## 2024-06-23 MED ORDER — LIDOCAINE HCL (PF) 2 % IJ SOLN
INTRAMUSCULAR | Status: AC
  Filled 2024-06-23: qty 5

## 2024-06-23 MED ORDER — SORBITOL 70 % SOLN
30.0000 mL | Freq: Every day | Status: DC | PRN
  Administered 2024-06-27: 30 mL via ORAL
  Filled 2024-06-23 (×2): qty 30

## 2024-06-23 MED ORDER — EPHEDRINE SULFATE-NACL 50-0.9 MG/10ML-% IV SOSY
PREFILLED_SYRINGE | INTRAVENOUS | Status: DC | PRN
  Administered 2024-06-23: 10 mg via INTRAVENOUS

## 2024-06-23 MED ORDER — FENTANYL CITRATE (PF) 100 MCG/2ML IJ SOLN
INTRAMUSCULAR | Status: DC | PRN
  Administered 2024-06-23 (×3): 50 ug via INTRAVENOUS

## 2024-06-23 MED ORDER — HYDRALAZINE HCL 20 MG/ML IJ SOLN: 5.0000 mg | INTRAMUSCULAR | Status: DC | PRN

## 2024-06-23 MED ORDER — HEPARIN SODIUM (PORCINE) 1000 UNIT/ML IJ SOLN
INTRAMUSCULAR | Status: AC
  Filled 2024-06-23: qty 10

## 2024-06-23 MED ORDER — VASOPRESSIN 20 UNIT/ML IV SOLN
INTRAVENOUS | Status: AC
  Filled 2024-06-23: qty 1

## 2024-06-23 MED ORDER — PROPOFOL 10 MG/ML IV BOLUS
INTRAVENOUS | Status: DC | PRN
  Administered 2024-06-23: 140 mg via INTRAVENOUS

## 2024-06-23 MED ORDER — FENTANYL CITRATE (PF) 100 MCG/2ML IJ SOLN
INTRAMUSCULAR | Status: AC
Start: 2024-06-23 — End: 2024-06-23
  Filled 2024-06-23: qty 2

## 2024-06-23 MED ORDER — HEPARIN 30,000 UNITS/1000 ML (OHS) CELLSAVER SOLUTION
Status: AC | PRN
  Administered 2024-06-23: 1

## 2024-06-23 MED ORDER — HEPARIN SODIUM (PORCINE) 1000 UNIT/ML IJ SOLN
INTRAMUSCULAR | Status: DC | PRN
  Administered 2024-06-23 (×2): 2000 [IU] via INTRAVENOUS
  Administered 2024-06-23: 7000 [IU] via INTRAVENOUS

## 2024-06-23 MED ORDER — HEPARIN SODIUM (PORCINE) 5000 UNIT/ML IJ SOLN
INTRAMUSCULAR | Status: AC
  Filled 2024-06-23: qty 1

## 2024-06-23 MED ORDER — PHENYLEPHRINE HCL-NACL 20-0.9 MG/250ML-% IV SOLN
INTRAVENOUS | Status: AC
  Filled 2024-06-23: qty 250

## 2024-06-23 MED ORDER — AMLODIPINE BESYLATE 5 MG PO TABS
5.0000 mg | ORAL_TABLET | Freq: Every day | ORAL | Status: DC
  Administered 2024-06-24 – 2024-07-02 (×9): 5 mg via ORAL
  Filled 2024-06-23 (×9): qty 1

## 2024-06-23 MED ORDER — SODIUM CHLORIDE 0.9 % IV SOLN
500.0000 mL | Freq: Once | INTRAVENOUS | Status: AC | PRN
  Administered 2024-06-23: 500 mL via INTRAVENOUS

## 2024-06-23 MED ORDER — MORPHINE SULFATE (PF) 2 MG/ML IV SOLN
2.0000 mg | INTRAVENOUS | Status: DC | PRN
  Administered 2024-06-23: 2 mg via INTRAVENOUS
  Administered 2024-06-23 – 2024-06-24 (×4): 4 mg via INTRAVENOUS
  Administered 2024-06-24: 5 mg via INTRAVENOUS
  Administered 2024-06-24: 2 mg via INTRAVENOUS
  Administered 2024-06-24 (×2): 4 mg via INTRAVENOUS
  Administered 2024-06-24: 2 mg via INTRAVENOUS
  Administered 2024-06-25: 5 mg via INTRAVENOUS
  Administered 2024-06-25 (×5): 4 mg via INTRAVENOUS
  Administered 2024-06-25: 2 mg via INTRAVENOUS
  Administered 2024-06-26 – 2024-06-30 (×35): 4 mg via INTRAVENOUS
  Administered 2024-06-30: 5 mg via INTRAVENOUS
  Administered 2024-06-30: 4 mg via INTRAVENOUS
  Administered 2024-07-01: 5 mg via INTRAVENOUS
  Administered 2024-07-01 (×2): 4 mg via INTRAVENOUS
  Filled 2024-06-23 (×17): qty 2
  Filled 2024-06-23: qty 3
  Filled 2024-06-23 (×11): qty 2
  Filled 2024-06-23: qty 1
  Filled 2024-06-23 (×2): qty 3
  Filled 2024-06-23 (×2): qty 2
  Filled 2024-06-23: qty 3
  Filled 2024-06-23 (×4): qty 2
  Filled 2024-06-23: qty 3
  Filled 2024-06-23: qty 1
  Filled 2024-06-23: qty 2
  Filled 2024-06-23: qty 1
  Filled 2024-06-23 (×17): qty 2

## 2024-06-23 MED ORDER — ASPIRIN 81 MG PO TBEC
81.0000 mg | DELAYED_RELEASE_TABLET | Freq: Every day | ORAL | Status: DC
  Administered 2024-06-24 – 2024-07-02 (×9): 81 mg via ORAL
  Filled 2024-06-23 (×9): qty 1

## 2024-06-23 MED ORDER — OXYCODONE HCL 5 MG PO TABS: 5.0000 mg | ORAL_TABLET | Freq: Once | ORAL | Status: DC | PRN

## 2024-06-23 MED ORDER — LABETALOL HCL 5 MG/ML IV SOLN: 10.0000 mg | INTRAVENOUS | Status: DC | PRN

## 2024-06-23 MED ORDER — METOPROLOL TARTRATE 5 MG/5ML IV SOLN: 2.5000 mg | INTRAVENOUS | Status: DC | PRN

## 2024-06-23 MED ORDER — MORPHINE SULFATE ER 15 MG PO TBCR
15.0000 mg | EXTENDED_RELEASE_TABLET | Freq: Two times a day (BID) | ORAL | Status: DC
  Administered 2024-06-23 – 2024-07-02 (×17): 15 mg via ORAL
  Filled 2024-06-23 (×18): qty 1

## 2024-06-23 MED ORDER — DROPERIDOL 2.5 MG/ML IJ SOLN: 0.6250 mg | Freq: Once | INTRAMUSCULAR | Status: DC | PRN

## 2024-06-23 MED ORDER — GENTAMICIN SULFATE 40 MG/ML IJ SOLN
INTRAMUSCULAR | Status: AC
  Filled 2024-06-23: qty 2

## 2024-06-23 MED ORDER — VANCOMYCIN HCL 1000 MG IV SOLR
INTRAVENOUS | Status: AC
  Filled 2024-06-23: qty 20

## 2024-06-23 MED ORDER — DAPAGLIFLOZIN PROPANEDIOL 5 MG PO TABS
5.0000 mg | ORAL_TABLET | Freq: Every day | ORAL | Status: DC
  Administered 2024-06-24 – 2024-07-02 (×5): 5 mg via ORAL
  Filled 2024-06-23 (×9): qty 1

## 2024-06-23 MED ORDER — CHLORHEXIDINE GLUCONATE 0.12 % MT SOLN
15.0000 mL | Freq: Once | OROMUCOSAL | Status: AC
  Administered 2024-06-23: 15 mL via OROMUCOSAL

## 2024-06-23 MED ORDER — FENTANYL CITRATE (PF) 100 MCG/2ML IJ SOLN
25.0000 ug | INTRAMUSCULAR | Status: DC | PRN
  Administered 2024-06-23 (×4): 25 ug via INTRAVENOUS

## 2024-06-23 MED ORDER — NITROGLYCERIN IN D5W 200-5 MCG/ML-% IV SOLN
INTRAVENOUS | Status: AC
Start: 2024-06-23 — End: 2024-06-23
  Filled 2024-06-23: qty 250

## 2024-06-23 MED ORDER — NITROGLYCERIN IN D5W 200-5 MCG/ML-% IV SOLN: 5.0000 ug/min | INTRAVENOUS | Status: DC

## 2024-06-23 MED ORDER — ALBUTEROL SULFATE (2.5 MG/3ML) 0.083% IN NEBU: 3.0000 mL | INHALATION_SOLUTION | Freq: Four times a day (QID) | RESPIRATORY_TRACT | Status: DC | PRN

## 2024-06-23 MED ORDER — ONDANSETRON HCL 4 MG/2ML IJ SOLN
INTRAMUSCULAR | Status: DC | PRN
  Administered 2024-06-23 (×2): 4 mg via INTRAVENOUS

## 2024-06-23 MED ORDER — HYDROMORPHONE HCL 1 MG/ML IJ SOLN
1.0000 mg | Freq: Once | INTRAMUSCULAR | Status: AC | PRN
  Administered 2024-06-23: 1 mg via INTRAVENOUS
  Filled 2024-06-23: qty 1

## 2024-06-23 MED ORDER — DOCUSATE SODIUM 100 MG PO CAPS
100.0000 mg | ORAL_CAPSULE | Freq: Every day | ORAL | Status: DC
  Administered 2024-06-24 – 2024-07-02 (×7): 100 mg via ORAL
  Filled 2024-06-23 (×8): qty 1

## 2024-06-23 MED ORDER — ATORVASTATIN CALCIUM 10 MG PO TABS
10.0000 mg | ORAL_TABLET | ORAL | Status: DC
  Administered 2024-06-24 – 2024-07-02 (×5): 10 mg via ORAL
  Filled 2024-06-23 (×6): qty 1

## 2024-06-23 MED ORDER — VANCOMYCIN HCL IN DEXTROSE 1-5 GM/200ML-% IV SOLN
1000.0000 mg | INTRAVENOUS | Status: AC
  Administered 2024-06-23: 1000 mg via INTRAVENOUS

## 2024-06-23 MED ORDER — SENNOSIDES-DOCUSATE SODIUM 8.6-50 MG PO TABS
1.0000 | ORAL_TABLET | Freq: Every evening | ORAL | Status: DC | PRN
Start: 2024-06-23 — End: 2024-07-02
  Administered 2024-06-27: 1 via ORAL
  Filled 2024-06-23: qty 1

## 2024-06-23 MED ORDER — PROTAMINE SULFATE 10 MG/ML IV SOLN
INTRAVENOUS | Status: AC
  Filled 2024-06-23: qty 25

## 2024-06-23 MED ORDER — ACETAMINOPHEN 500 MG PO TABS
500.0000 mg | ORAL_TABLET | Freq: Four times a day (QID) | ORAL | Status: DC | PRN
  Administered 2024-06-24 – 2024-06-26 (×3): 500 mg via ORAL
  Filled 2024-06-23 (×4): qty 1

## 2024-06-23 MED ORDER — HEMOSTATIC AGENTS (NO CHARGE) OPTIME
TOPICAL | Status: DC | PRN
Start: 2024-06-23 — End: 2024-06-23
  Administered 2024-06-23: 2 via TOPICAL

## 2024-06-23 MED ORDER — ACETAMINOPHEN 10 MG/ML IV SOLN
INTRAVENOUS | Status: DC | PRN
  Administered 2024-06-23: 1000 mg via INTRAVENOUS

## 2024-06-23 MED ORDER — DEXMEDETOMIDINE HCL IN NACL 80 MCG/20ML IV SOLN
INTRAVENOUS | Status: DC | PRN
  Administered 2024-06-23 (×3): 8 ug via INTRAVENOUS

## 2024-06-23 MED ORDER — SODIUM BICARBONATE 650 MG PO TABS
650.0000 mg | ORAL_TABLET | Freq: Two times a day (BID) | ORAL | Status: DC
  Administered 2024-06-23 – 2024-07-02 (×18): 650 mg via ORAL
  Filled 2024-06-23 (×19): qty 1

## 2024-06-23 MED ORDER — ACETAMINOPHEN 10 MG/ML IV SOLN
1000.0000 mg | Freq: Once | INTRAVENOUS | Status: DC | PRN
Start: 2024-06-23 — End: 2024-06-23

## 2024-06-23 MED ORDER — VASHE WOUND IRRIGATION OPTIME
TOPICAL | Status: DC | PRN
Start: 2024-06-23 — End: 2024-06-23
  Administered 2024-06-23: 34 [oz_av]

## 2024-06-23 MED ORDER — SODIUM CHLORIDE 0.9 % IV SOLN: INTRAVENOUS | Status: DC | PRN

## 2024-06-23 MED ORDER — PROPOFOL 10 MG/ML IV BOLUS
INTRAVENOUS | Status: AC
  Filled 2024-06-23: qty 40

## 2024-06-23 MED ORDER — ALBUMIN HUMAN 5 % IV SOLN
INTRAVENOUS | Status: AC
  Filled 2024-06-23: qty 250

## 2024-06-23 MED ORDER — LACTATED RINGERS IV SOLN: INTRAVENOUS | Status: DC

## 2024-06-23 MED ORDER — CHLORHEXIDINE GLUCONATE CLOTH 2 % EX PADS
6.0000 | MEDICATED_PAD | Freq: Every day | CUTANEOUS | Status: DC
  Administered 2024-06-24 – 2024-07-02 (×8): 6 via TOPICAL

## 2024-06-23 MED ORDER — VANCOMYCIN HCL IN DEXTROSE 1-5 GM/200ML-% IV SOLN
1000.0000 mg | Freq: Two times a day (BID) | INTRAVENOUS | Status: AC
  Administered 2024-06-23 – 2024-06-24 (×2): 1000 mg via INTRAVENOUS
  Filled 2024-06-23 (×2): qty 200

## 2024-06-23 MED ORDER — ORAL CARE MOUTH RINSE
15.0000 mL | Freq: Once | OROMUCOSAL | Status: AC
Start: 2024-06-23 — End: 2024-06-23

## 2024-06-23 MED ORDER — SEVOFLURANE IN SOLN
RESPIRATORY_TRACT | Status: AC
Start: 2024-06-23 — End: 2024-06-23
  Filled 2024-06-23: qty 250

## 2024-06-23 MED ORDER — PSYLLIUM 95 % PO PACK
1.0000 | PACK | Freq: Two times a day (BID) | ORAL | Status: DC
  Administered 2024-06-23 – 2024-07-01 (×7): 1 via ORAL
  Filled 2024-06-23 (×18): qty 1

## 2024-06-23 MED ORDER — PHENOL 1.4 % MT LIQD: 1.0000 | OROMUCOSAL | Status: DC | PRN

## 2024-06-23 MED ORDER — POTASSIUM CHLORIDE CRYS ER 20 MEQ PO TBCR: 40.0000 meq | EXTENDED_RELEASE_TABLET | Freq: Every day | ORAL | Status: DC | PRN

## 2024-06-23 MED ORDER — LIDOCAINE HCL (CARDIAC) PF 100 MG/5ML IV SOSY
PREFILLED_SYRINGE | INTRAVENOUS | Status: DC | PRN
  Administered 2024-06-23: 100 mg via INTRAVENOUS

## 2024-06-23 MED ORDER — VITAMIN D 25 MCG (1000 UNIT) PO TABS
1000.0000 [IU] | ORAL_TABLET | Freq: Every day | ORAL | Status: DC
  Administered 2024-06-24 – 2024-07-02 (×9): 1000 [IU] via ORAL
  Filled 2024-06-23 (×9): qty 1

## 2024-06-23 MED ORDER — HEPARIN SODIUM (PORCINE) 1000 UNIT/ML IJ SOLN
INTRAMUSCULAR | Status: AC
Start: 2024-06-23 — End: 2024-06-23
  Filled 2024-06-23: qty 10

## 2024-06-23 SURGICAL SUPPLY — 65 items
BAG DECANTER FOR FLEXI CONT (MISCELLANEOUS) ×2 IMPLANT
BALLOON DORADO 8X40X80 (BALLOONS) IMPLANT
BALLOON ULTRVRSE 8X100X75 (BALLOONS) IMPLANT
BLADE SURG 15 STRL LF DISP TIS (BLADE) ×2 IMPLANT
BLADE SURG SZ11 CARB STEEL (BLADE) ×2 IMPLANT
BRUSH SCRUB EZ 4% CHG (MISCELLANEOUS) ×2 IMPLANT
CATH BEACON 5 .035 40 KMP TP (CATHETERS) IMPLANT
CATH KUMPE SOFT-VU 5FR 65 (CATHETERS) IMPLANT
CHLORAPREP W/TINT 26 (MISCELLANEOUS) ×2 IMPLANT
CLAMP SUTURE YELLOW 5 PAIRS (MISCELLANEOUS) ×2 IMPLANT
CLEANSER WND VASHE 34 (WOUND CARE) ×2 IMPLANT
CLIP APPLIE 9.375 SM OPEN (CLIP) IMPLANT
DEVICE PRESTO INFLATION (MISCELLANEOUS) IMPLANT
DRAPE C-ARM XRAY 36X54 (DRAPES) ×2 IMPLANT
DRAPE INCISE IOBAN 66X45 STRL (DRAPES) ×2 IMPLANT
DRESSING SURGICEL FIBRLLR 1X2 (HEMOSTASIS) ×2 IMPLANT
DRSG OPSITE POSTOP 4X6 (GAUZE/BANDAGES/DRESSINGS) IMPLANT
ELECT CAUTERY BLADE 6.4 (BLADE) IMPLANT
ELECTRODE REM PT RTRN 9FT ADLT (ELECTROSURGICAL) ×2 IMPLANT
GAUZE 4X4 16PLY ~~LOC~~+RFID DBL (SPONGE) IMPLANT
GLIDEWIRE ADV .035X260CM (WIRE) IMPLANT
GLOVE BIO SURGEON STRL SZ7 (GLOVE) ×6 IMPLANT
GOWN STRL REUS W/ TWL LRG LVL3 (GOWN DISPOSABLE) ×4 IMPLANT
GOWN STRL REUS W/ TWL XL LVL3 (GOWN DISPOSABLE) ×2 IMPLANT
GOWN STRL REUS W/TWL 2XL LVL3 (GOWN DISPOSABLE) ×2 IMPLANT
GRAFT VASC PATCH XENOSURE 1X14 (Vascular Products) IMPLANT
HEMOSTAT HEMOBLAST BELLOWS (HEMOSTASIS) IMPLANT
IV NS 500ML BAXH (IV SOLUTION) ×2 IMPLANT
KIT STIMULAN RAPID CURE 5CC (Orthopedic Implant) IMPLANT
KIT TURNOVER KIT A (KITS) ×2 IMPLANT
LABEL OR SOLS (LABEL) ×2 IMPLANT
LOOP VESSEL MAXI 1X406 RED (MISCELLANEOUS) ×4 IMPLANT
LOOP VESSEL MINI 0.8X406 BLUE (MISCELLANEOUS) ×4 IMPLANT
MANIFOLD NEPTUNE II (INSTRUMENTS) ×2 IMPLANT
NDL SAFETY ECLIPSE 18X1.5 (NEEDLE) ×2 IMPLANT
NS IRRIG 500ML POUR BTL (IV SOLUTION) ×2 IMPLANT
PACK ANGIOGRAPHY (CUSTOM PROCEDURE TRAY) IMPLANT
PACK BASIN MAJOR ARMC (MISCELLANEOUS) ×2 IMPLANT
PACK UNIVERSAL (MISCELLANEOUS) ×2 IMPLANT
PENCIL SMOKE EVACUATOR (MISCELLANEOUS) IMPLANT
RETRACTOR TRAXI PANNICULUS (MISCELLANEOUS) IMPLANT
SET WALTER ACTIVATION W/DRAPE (SET/KITS/TRAYS/PACK) ×2 IMPLANT
SHEATH BRITE TIP 8FRX11 (SHEATH) IMPLANT
SPONGE T-LAP 18X18 ~~LOC~~+RFID (SPONGE) IMPLANT
STAPLER SKIN PROX 35W (STAPLE) ×2 IMPLANT
STENT LIFESTREAM 8X37X80 (Permanent Stent) IMPLANT
STENT LIFESTREAM 8X58X80 (Permanent Stent) IMPLANT
STENT LIFESTREAM 9X38X80 (Permanent Stent) IMPLANT
STENT LIFESTREAM 9X58X80 (Permanent Stent) IMPLANT
STENT VIABAHN 9X10X120 (Permanent Stent) IMPLANT
STENT VIABAHN 9X7.5X120 (Permanent Stent) IMPLANT
SUT PROLENE 5 0 RB 1 DA (SUTURE) ×4 IMPLANT
SUT PROLENE 6 0 BV (SUTURE) ×8 IMPLANT
SUT PROLENE 7 0 BV 1 (SUTURE) ×4 IMPLANT
SUT SILK 2-0 18XBRD TIE 12 (SUTURE) ×2 IMPLANT
SUT SILK 3-0 18XBRD TIE 12 (SUTURE) ×2 IMPLANT
SUT SILK 4-0 18XBRD TIE 12 (SUTURE) ×2 IMPLANT
SUT VIC AB 2-0 CT1 TAPERPNT 27 (SUTURE) ×4 IMPLANT
SUT VIC AB 3-0 SH 27X BRD (SUTURE) ×2 IMPLANT
SUT VICRYL+ 3-0 36IN CT-1 (SUTURE) ×4 IMPLANT
SUTURE EHLN 3-0 FS-10 30 BLK (SUTURE) ×2 IMPLANT
SYR 5ML LL (SYRINGE) ×2 IMPLANT
TRAP FLUID SMOKE EVACUATOR (MISCELLANEOUS) ×2 IMPLANT
TRAY FOLEY MTR SLVR 16FR STAT (SET/KITS/TRAYS/PACK) ×2 IMPLANT
WATER STERILE IRR 500ML POUR (IV SOLUTION) ×2 IMPLANT

## 2024-06-23 NOTE — Op Note (Signed)
 OPERATIVE NOTE   PROCEDURE: 1.   Left common femoral and superficial femoral artery endarterectomies and bovine pericardial patch angioplasty 2.   Right common femoral and superficial femoral artery endarterectomies and bovine pericardial patch angioplasty 3.   Separate and distinct left profunda femoris endarterectomy and bovine pericardial patch angioplasty 4.   Separate distinct right profunda femoris endarterectomy and bovine pericardial patch angioplasty 5.   Aortogram and bilateral iliofemoral angiograms 6.  Kissing balloon expandable stent placements to bilateral common iliac arteries up into the distal aorta with 9 mm diameter by 37 mm length Lifestream stent on the right and 9 mm diameter by 58 mm length Lifestream stent on the left 7.  Additional stent placements in the right external iliac artery with 8 mm diameter by 37 mm length Lifestream stent and 9 mm diameter by 7.5 cm length Viabahn stents 8.  Additional stent placement in the left external iliac artery with 8 mm diameter by 58 mm length Lifestream stent and 9 mm diameter by 10 cm length Viabahn stent 9.  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  significant plaque in bilateral common femoral, profunda femoris, and superficial femoral arteries.  The plaque in the profunda femoris arteries was quite extensive and extended distally requiring separate arteriotomy and patch angioplasty on each side Significant aortoiliac disease with moderate common iliac artery stenosis in the 50 to 60% range bilaterally and greater than 70% external iliac artery stenosis on the left and moderate right external iliac artery stenosis in the 60%  range  SPECIMEN(S):  Bilateral common femoral, profunda femoris, and superficial femoral artery plaque.  INDICATIONS:    Patient presents with ulceration and rest pain of the lower extremities.  Bilateral femoral endarterectomies as well as aortoiliac intervention are planned to try to improve perfusion.  The risks and benefits as well as alternative therapies including intervention were reviewed in detail all questions were answered the patient agrees to proceed with surgery.  DESCRIPTION: After obtaining full informed written consent, the patient was brought back to the operating room and placed supine upon the operating table.  The patient received IV antibiotics prior to induction.  After obtaining adequate anesthesia, the patient was prepped and draped in the standard fashion appropriate time out is called.    With myself working on the left and Dr. Shawl working on the right we began by dissecting out the femoral arteries on each side. Vertical incisions were created overlying both femoral arteries. The common femoral artery proximally, and superficial femoral artery, and primary profunda femoris artery branches were encircled with vessel loops and prepared for control. Both femoral arteries were found to have significant plaque from the common femoral artery into the profunda and superficial femoral arteries.  The profunda femoris arteries were heavily diseased requiring dissection out beyond the third order branch the profunda femoris artery on each side.   7000 units of heparin  was given and allowed circulate for 5 minutes.  Additional heparin  was given later in the procedure as needed  Attention is then turned to the right femoral artery.  An arteriotomy is made with 11 blade and extended with Potts scissors in the common femoral artery and carried down onto the first 1-2 cm of the superficial femoral artery.  Extensive  disease in the profunda femoris artery on the right would require a  separate arteriotomy and patch angioplasty.  An endarterectomy was then performed. The [facility] was used to create a plane. The proximal endpoint was cut flush with tenotomy scissors. This was in the proximal common femoral artery.  The origin of the profunda femoris artery was seen and care was taken to make sure the plaque did not extend into the common femoral artery from the profunda femoris artery initially and the we then turned our attention to the SFA endpoint.  The distal endpoint of the superficial femoral endarterectomy was created with gentle traction and the distal endpoint was tacked down with 7-0 Prolene sutures.  The bovine pericardial patcth is then selected and prepared for a patch angioplasty.  It is cut and beveled and started at the proximal endpoint with a 6-0 Prolene suture.  Approximately one half of the suture line is run medially and laterally and the distal end point was cut and bevelled to match the arteriotomy.  A second 6-0 Prolene was started at the distal end point and run to the mid portion to complete the arteriotomy, leaving a gap for placement of the sheath for the endovascular portion of the procedure.   We then turned our attention to the right profunda femoris artery.  Control was still obtained proximally and distally from previous.  An anterior wall arteriotomy was created with 11 blade and extended with Potts scissors in the profunda femoris artery down beyond the second-order branch.  An endarterectomy was then performed using the [facility].  The distal endpoint was created and then tacked down with two 7-0 Prolene sutures.  The large sidebranch proximally also had an eversion endarterectomy was performed and it was good backbleeding.  The bovine pericardial was then cut and beveled to match the arteriotomy.  6-0 Prolene was started first at the distal endpoint and then run half the length of the suture line and then started at the proximal end point and run  medially and laterally to close the suture line.  The vessel was flushed prior to release of control.  An 8 French sheath was then placed in the right common femoral suture line.  The left femoral artery is then addressed. Arteriotomy is made in the common femoral artery and extended down into the first centimeter or 2 of the left superficial femoral artery.  Again, a separate distinct profunda femoris arteriotomy and patch angioplasty would be required due to the extensive nature of the disease distally.  Similarly, an endarterectomy was performed with the [facility]. The proximal endpoint was cut flush with tenotomy scissors in the proximal common femoral artery. The arteriotomy was carried down onto the superficial femoral artery and the endarterectomy was continued to this point. The distal endpoint was tacked down with three 7-0 Prolene sutures in the superficial femoral artery.. The bovine pericardial patch was then brought onto the field.  It is cut and beveled and started at the proximal endpoint with a 6-0 Prolene suture.  Approximately one half of the suture line is run medially and laterally and the distal end point was cut and bevelled to match the arteriotomy.  A second 6-0 Prolene was started at the distal end point and run to the mid portion to complete the arteriotomy, leaving a gap for the 8 French sheath to treat the aortoiliac disease proximally.    We then turned our attention to the left profunda femoris artery.  An anterior wall  arteriotomy was created with an 11 blade and extended with Potts scissors in the profunda femoris artery.  This went down to just above the second large branch of the profunda femoris artery.  An endarterectomy was then performed.  A nice proximal endpoint was created with gentle traction.  There was also a reasonable sized sidebranch that was treated with an eversion endarterectomy in the profunda femoris artery with resultant good backbleeding.  The distal  endpoint was created with gentle traction and was fairly clean.  We then proceeded with patch angioplasty closure of the profunda femoris artery on the left.  A piece of the bovine pericardial patch was cut and beveled to match the arteriotomy.  A 6-0 Prolene was started at the proximal and the distal endpoint and run to the midportion of the suture line medially and laterally to complete the suture line.  The vessel was flushed and de-aired prior to release of control.  An 8 French sheath was then placed in the gap in the common femoral arteriotomy to treat the aortoiliac disease proximally.  Imaging through the femoral sheaths and then Kumpe catheters placed in the aorta showed fairly normal renal arteries and aorta. Significant aortoiliac disease with moderate common iliac artery stenosis in the 50 to 60% range bilaterally and greater than 70% external iliac artery stenosis on the left and moderate right external iliac artery stenosis in the 60% range.  Advantage wires were used to cross the disease bilaterally without difficulty.  On the right side, the proximal external iliac artery was addressed with an 8 mm diameter by 38 mm length Lifestream stent.  On the left side, the proximal external iliac artery was addressed with an 8 mm diameter by 58 mm length Lifestream stent.  We then placed 9 mm diameter by 38 mm length Lifestream stent on the right and a 9 mm diameter by 58 mm length Lifestream stent on the left and deployed the simultaneously in a kissing balloon expandable fashion in the common iliac artery and up into the distal aorta.  These were inflated to 12 atm.  For the more distal disease down just above the femoral artery endarterectomy, Viabahn stents were placed in the external iliac artery.  On the right side, this was a 9 mm diameter by 75 mm length Viabahn stent and on the left a 9 mm diameter by 10 cm length Viabahn stent was then placed.  These were postdilated with 8 mm balloons.  Completion  imaging showed less than 10% residual stenosis in bilateral common and external iliac arteries.  The sheaths were then removed and control obtained in each femoral artery for completion of the anastomosis.  Attention was turned to the right femoral artery.  The vessel was flushed prior to release of control and completion of the anastomosis.  At this point, flow was established first to the profunda femoris artery and then to the superficial femoral artery. Easily palpable pulses are noted well beyond the anastomosis and both arteries. Attention was then turned to the left femoral artery. Flushing maneuvers were performed and flow was reestablished to the femoral vessels. Excellent pulses noted in the left superficial femoral and profunda femoris artery below the femoral anastomosis.  Vashe irrigation and antibiotic beads in the form of vancomycin  and gentamicin  impregnated beads were then placed.  Fibrillar and Hemoblast topical hemostatic agents were placed in the femoral incisions and hemostasis was complete. The femoral incisions were then closed in a layered fashion with 2 layers of 2-0  Vicryl, 2 layers of 3-0 Vicryl, and staples for the skin closure. Sterile dressing were then placed over all incisions.  The patient was then awakened from anesthesia and taken to the recovery room in stable condition having tolerated the procedure well.  COMPLICATIONS: None  CONDITION: Stable     Selinda Gu 06/23/2024 1:47 PM  This note was created with Dragon Medical transcription system. Any errors in dictation are purely unintentional.

## 2024-06-23 NOTE — Op Note (Signed)
 OPERATIVE NOTE   PROCEDURE: Bilateral common femoral and superficial femoral  endarterectomy with bovine pericardial patch angioplasty. Bilateral profunda femoris endarterectomy with bovine pericardial patch angioplasty (completely separate and distinct second patch used for each profunda femoris repair) Open angioplasty and stent placement of the right common and external iliac arteries. Open angioplasty and stent placement of the left common and external iliac arteries.  PRE-OPERATIVE DIAGNOSIS: Atherosclerotic occlusive disease bilateral lower extremities with lifestyle limiting claudication and mild rest pain symptoms; hypertension; diabetes mellitus  POST-OPERATIVE DIAGNOSIS: Same  CO-SURGEON: Cordella JUDITHANN Shawl, MD and Selinda CANDIE Gu, M.D.  ASSISTANT(S): None  ANESTHESIA: general  ESTIMATED BLOOD LOSS: 300 cc  FINDING(S): Profound calcific plaque noted bilaterally extending past the initial bifurcation of the profunda femoris arteries as well as down the extensive length of the SFA  SPECIMEN(S):  Calcific plaque from the common femoral, superficial femoral and the profunda femoris arteries bilaterally  INDICATIONS:   Jeffery Ponce 78 y.o. y.o.male who presents with complaints of lifestyle limiting claudication and pain continuously in the feet bilaterally. The patient has documented severe atherosclerotic occlusive disease and has undergone multiple minimally invasive treatments in the past. However, at this point his primary area of stricture stenosis resides in the common femoral and origins of the superficial femoral and profunda femoris extending into these arteries and therefore this is not amenable to intervention and he is now undergoing open endarterectomy. The risks and benefits of been reviewed with the patient, all questions have answered; alternative therapies have been reviewed as well and the patient has agreed to proceed with surgical open  repair.  DESCRIPTION: After obtaining full informed written consent, the patient was brought back to the operating room and placed supine upon the operating table.  The patient received IV antibiotics prior to induction.  After obtaining adequate anesthesia, the patient was prepped and draped in the standard fashion for: bilateral femoral exposure.  Co-surgeons are required because this is a very complex bilateral procedure with work being performed simultaneously from both the right femoral and left femoral approach.  This also expedites the procedure making a shorter operative time reducing complications and improving patient safety.  Attention was turned to the bilateral groin with Dr. Gu working on the patient's left and myself working on the right of the patient.  Vertical  incisions were made over the common femoral artery and dissected down to the common femoral artery with electrocautery.  I dissected out the common femoral artery from the distal external iliac artery (identified by the superficial circumflex vessels) down to the femoral bifurcation.  On initial inspection, the common femoral artery was: Densely calcified and there was no palpable pulse noted bilaterally.    Subsequently the dissection was continued to include all circumflex branches and the profunda femoral artery and superficial femoral artery. The superficial femoral artery was dissected circumferentially for a distance of approximately 3-4 cm.  In this particular situation given the profound nature of the patient's atherosclerotic changes the profunda femoris arteries were dissected circumferentially out to a distance of approximately 10 to 12 cm in order to find a transition to soft arteries that were an acceptable target.  Profunda femoris branches individual vessel loops were placed around each branch. Both of the groins were treated simultaneously as described above. Control of all branches was obtained with vessel loops.  A  softer area in the distal external iliac artery amendable to clamping was identified.    The patient was given 6000 units  of Heparin  intravenously with additional 2000 unit boluses hourly as needed, which was a therapeutic bolus.   After waiting 3 minutes, the right distal external iliac artery was clamped and we placed all the Silastic Vesseloops on the circumflex branches, the profunda and superficial femoral arteries under tension.  Arteriotomy was made in the right common femoral artery with a 11-blade and extended it with a Potts scissor proximally and distally extending the distal end down the SFA for approximately 3 cm.   Endarterectomy was then performed under direct visualization using a freer elevator and a right angle from the mid common femoral extending up both proximally and distally. Proximally the endarterectomy was brought up to the level of the clamp where a clean edge was obtained. Distally the endarterectomy was carried down to a soft spot in the SFA where a feathered edge would was obtained.  7-0 Prolene interrupted tacking sutures were placed to secure the leading edge of the plaque in the SFA.  A bovine pericardial patch was then delivered onto the field trimmed to the appropriate length for the common femoral SFA repair and then sewn using a 6-0 Prolene in a running fashion beginning in the proximal common femoral and running to the midline and then beginning in the SFA and running to the midline.  The medial suture line was completed.  In the lateral suture line a gap was left and secured with 2 interrupted 6-0 Prolene sutures for placement of the sheath.  The right profunda femoris was treated with an open separate and distinct endarterectomy beginning approximately 2 cm distall to the origin.  Arteriotomy was made with an 11 blade and extended with Potts scissors.  Endarterectomy was performed under direct visualization.  7-0 Prolene tacking sutures were used distally to secure the  intimal edge.  A completely separate second bovine pericardial patch was then delivered into the field trimmed to the appropriate length and sewn as a patch angioplasty to the profunda femoris using 6-0 Prolene in a running fashion beginning at the proximal apex and running to the midline and then beginning distally and running to the midline.  The suture line was completed both medial and laterally.  Prior to completing the suture line flushing maneuvers were performed backbleeding was noted to be excellent.   An 8 French sheath was then inserted under direct visualization with a J-wire and a retrograde direction through the Left in the common femoral patch.  Once the sheath was placed proximal flow was then reestablished into the profunda femoris.  At this point attention was turned to the opposite groin.  The left distal external iliac artery was clamped and we placed all the Silastic Vesseloops on the circumflex branches, the profunda and superficial femoral arteries under tension.  Arteriotomy was made in the left common femoral artery with a 11-blade and extended it with a Potts scissor proximally and distally extending the distal end down the SFA for approximately 3 cm.   Endarterectomy was then performed under direct visualization using a freer elevator and a right angle from the mid common femoral extending up both proximally and distally. Proximally the endarterectomy was brought up to the level of the clamp where a clean edge was obtained. Distally the endarterectomy was carried down to a soft spot in the SFA.  A bovine pericardial patch was then delivered onto the field trimmed to the appropriate length for the common femoral SFA repair and then sewn using a 6-0 Prolene in a running fashion beginning in  the proximal common femoral and running to the midline and then beginning in the SFA and running to the midline.  The medial suture line was completed.  In the lateral suture line a gap was left and  secured with 2 interrupted 6-0 Prolene sutures for placement of the sheath.  The left profunda femoris was treated with an open separate and distinct endarterectomy beginning approximately 2 cm distall to the origin.  Arteriotomy was made with an 11 blade and extended with Potts scissors.  Endarterectomy was performed under direct visualization.  7-0 Prolene tacking sutures were used distally to secure the intimal edge.  A completely separate second bovine pericardial patch was then delivered into the field trimmed to the appropriate length and sewn as a patch angioplasty to the profunda femoris using 6-0 Prolene in a running fashion beginning at the proximal apex and running to the midline and then beginning distally and running to the midline.  The suture line was completed both medial and laterally.  Prior to completing the suture line flushing maneuvers were performed backbleeding was noted to be excellent.   An 8 French sheath was then inserted under direct visualization with a J-wire and a retrograde direction through the Left in the common femoral patch.  Once the sheath was placed proximal flow was then reestablished into the profunda femoris.  We then began the interventional portion of the case.  With Dr. Marea working on the patient's left side and myself working on the patient's right side we advanced wires through our 8 [place] sheath up into the aorta.  Kumpe catheter was then were advanced and hand-injection of contrast was used to verify both intraluminal positioning as well as demonstrate the anatomy in the aorta and bilateral common and external iliac arteries.  Hemodynamically significant distal aortic disease was noted extending into the origins of the common iliac arteries.  Also distal diffuse disease in the common iliac arteries was identified.  Greater than 70% stenosis was noted in the external iliac arteries both at their origin as well as distally proximal to the circumflex vessels.  We  began by placing a millimeter by 58 mm Lifestream stents in the proximal external iliac arteries bilaterally.  We then advanced 9 mm x 38 mm Lifestream stents and reconstructed the distal aorta and common iliac arteries.  Inflations were 10 to 12 atm for approximately 1 minute.  The common iliac 9 mm Lifestream stents were deployed in the kissing balloon fashion.  Imaging of the distal external iliac on the right demonstrated the above noted stenosis and a 9 mm x 75 mm Viabahn stent was deployed down to the level of the ileal inguinal ligament and postdilated with an 8 mm balloon.  On the left a similar lesion was noted and a 9 mm x 100 mm Viabahn stent was then deployed again extending the distal margin down to the level of the ilioinguinal ligament.  This tube was postdilated with a 9 mm balloon.  Completion imaging from the aorta then demonstrated less than 10% residual stenosis throughout the entire distal aorta iliac system.  There is now rapid flow of contrast into the common femoral arteries.  The patch angioplasty was completed first on the right and then on the left.  Flow was then reestablished first to the profunda femoris and then the superficial femoral artery. Any gaps or bleeding sites in the suture line were easily controlled with a 6-0 Prolene suture.   Both right and left groins were then  irrigated copiously with Vashe he and subsequently Heema blast and fibrillar were placed in the wound.  Antibiotic beads reconstituted with vancomycin  and gentamicin  were then placed in the bed of the wound.  The incision was repaired with a double layer of 2-0 Vicryl, a double layer of 3-0 Vicryl, and the skin was closed with staples.  Honeycomb dressings were then applied to both groins.  COMPLICATIONS: None  CONDITION: Jeffery Ponce, [facility] Office: [phone]  06/23/2024, 7:24 PM

## 2024-06-23 NOTE — Anesthesia Postprocedure Evaluation (Signed)
 Anesthesia Post Note  Patient: Jeffery Ponce  Procedure(s) Performed: ENDARTERECTOMY, FEMORAL (Bilateral) INSERTION, STENT, ARTERY, ILIAC (Bilateral) APPLICATION OF CELL SAVER  Patient location during evaluation: PACU Anesthesia Type: General Level of consciousness: awake and alert Pain management: pain level controlled Vital Signs Assessment: post-procedure vital signs reviewed and stable Respiratory status: spontaneous breathing, nonlabored ventilation, respiratory function stable and patient connected to nasal cannula oxygen Cardiovascular status: blood pressure returned to baseline and stable Postop Assessment: no apparent nausea or vomiting Anesthetic complications: no   There were no known notable events for this encounter.   Last Vitals:  Vitals:   06/23/24 2000 06/23/24 2100  BP: (!) 113/49 (!) 109/48  Pulse: (!) 59 65  Resp: 15 11  Temp:    SpO2: 100% 100%    Last Pain:  Vitals:   06/23/24 1700  TempSrc:   PainSc: Asleep                 Lynwood KANDICE Clause

## 2024-06-23 NOTE — Anesthesia Procedure Notes (Signed)
 Procedure Name: Intubation Date/Time: 06/23/2024 7:54 AM  Performed by: Kearney Rosina SAILOR, RNPre-anesthesia Checklist: Patient identified, Patient being monitored, Timeout performed, Emergency Drugs available and Suction available Patient Re-evaluated:Patient Re-evaluated prior to induction Oxygen Delivery Method: Circle system utilized Preoxygenation: Pre-oxygenation with 100% oxygen Induction Type: IV induction Ventilation: Mask ventilation without difficulty Laryngoscope Size: McGrath and 4 Grade View: Grade I Tube type: Oral Tube size: 7.5 mm Number of attempts: 1 Airway Equipment and Method: Stylet Placement Confirmation: ETT inserted through vocal cords under direct vision, positive ETCO2 and breath sounds checked- equal and bilateral Secured at: 22 cm Tube secured with: Tape Dental Injury: Teeth and Oropharynx as per pre-operative assessment  Comments: atraumatic
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
## 2024-06-23 NOTE — Transfer of Care (Signed)
 Immediate Anesthesia Transfer of Care Note  Patient: [name]  Procedure(s) Performed: ENDARTERECTOMY, FEMORAL (Bilateral) INSERTION, STENT, ARTERY, ILIAC (Bilateral) APPLICATION OF CELL SAVER  Patient Location: PACU  Anesthesia Type:General  Level of Consciousness: drowsy  Airway & Oxygen Therapy: Patient Spontanous Breathing and Patient connected to face mask oxygen  Post-op Assessment: Report given to RN and Post -op Vital signs reviewed and stable  Post vital signs: Reviewed and stable  Last Vitals:  Vitals Value Taken Time  BP 99/53 06/23/24 13:52  Temp 36.1 C 06/23/24 13:52  Pulse 76 06/23/24 13:57  Resp 19 06/23/24 13:57  SpO2 100 % 06/23/24 13:57  Vitals shown include unfiled device data.  Last Pain:  Vitals:   06/23/24 0702  TempSrc: Temporal  PainSc: 0-No pain         Complications: There were no known notable events for this encounter.

## 2024-06-23 NOTE — Interval H&P Note (Signed)
 History and Physical Interval Note:  06/23/2024 7:28 AM  Jeffery Ponce  has presented today for surgery, with the diagnosis of ASO WITH ULCERATION.  The various methods of treatment have been discussed with the patient and family. After consideration of risks, benefits and other options for treatment, the patient has consented to  Procedure(s) with comments: ENDARTERECTOMY, FEMORAL (Bilateral) - LEFT INSERTION, STENT, ARTERY, ILIAC (Bilateral) APPLICATION OF CELL SAVER (N/A) as a surgical intervention.  The patient's history has been reviewed, patient examined, no change in status, stable for surgery.  I have reviewed the patient's chart and labs.  Questions were answered to the patient's satisfaction.     [name]

## 2024-06-24 ENCOUNTER — Encounter: Payer: Self-pay | Admitting: Vascular Surgery

## 2024-06-24 ENCOUNTER — Ambulatory Visit (INDEPENDENT_AMBULATORY_CARE_PROVIDER_SITE_OTHER): Admitting: Nurse Practitioner

## 2024-06-24 DIAGNOSIS — Z9889 Other specified postprocedural states: Secondary | ICD-10-CM

## 2024-06-24 LAB — BASIC METABOLIC PANEL WITH GFR
Anion gap: 7 (ref 5–15)
Anion gap: 8 (ref 5–15)
BUN: 50 mg/dL — ABNORMAL HIGH (ref 8–23)
BUN: 53 mg/dL — ABNORMAL HIGH (ref 8–23)
CO2: 18 mmol/L — ABNORMAL LOW (ref 22–32)
CO2: 19 mmol/L — ABNORMAL LOW (ref 22–32)
Calcium: 8.2 mg/dL — ABNORMAL LOW (ref 8.9–10.3)
Calcium: 8.3 mg/dL — ABNORMAL LOW (ref 8.9–10.3)
Chloride: 115 mmol/L — ABNORMAL HIGH (ref 98–111)
Chloride: 112 mmol/L — ABNORMAL HIGH (ref 98–111)
Creatinine, Ser: 2.31 mg/dL — ABNORMAL HIGH (ref 0.61–1.24)
Creatinine, Ser: 2.2 mg/dL — ABNORMAL HIGH (ref 0.61–1.24)
GFR, Estimated: 28 mL/min — ABNORMAL LOW (ref >60)
GFR, Estimated: 30 mL/min — ABNORMAL LOW (ref >60)
Glucose, Bld: 121 mg/dL — ABNORMAL HIGH (ref 70–99)
Glucose, Bld: 127 mg/dL — ABNORMAL HIGH (ref 70–99)
Potassium: 5.8 mmol/L — ABNORMAL HIGH (ref 3.5–5.1)
Potassium: 5.1 mmol/L (ref 3.5–5.1)
Sodium: 140 mmol/L (ref 135–145)
Sodium: 139 mmol/L (ref 135–145)

## 2024-06-24 LAB — CBC
HCT: 22.7 % — ABNORMAL LOW (ref 39.0–52.0)
Hemoglobin: 7.1 g/dL — ABNORMAL LOW (ref 13.0–17.0)
MCH: 29 pg (ref 26.0–34.0)
MCHC: 31.3 g/dL (ref 30.0–36.0)
MCV: 92.7 fL (ref 80.0–100.0)
Platelets: 260 K/uL (ref 150–400)
RBC: 2.45 MIL/uL — ABNORMAL LOW (ref 4.22–5.81)
RDW: 13.7 % (ref 11.5–15.5)
WBC: 18.7 K/uL — ABNORMAL HIGH (ref 4.0–10.5)
nRBC: 0 % (ref 0.0–0.2)

## 2024-06-24 LAB — SURGICAL PATHOLOGY

## 2024-06-24 MED ORDER — SODIUM CHLORIDE 0.9 % IV BOLUS
1000.0000 mL | Freq: Once | INTRAVENOUS | Status: AC
  Administered 2024-06-24: 1000 mL via INTRAVENOUS

## 2024-06-24 NOTE — TOC Progression Note (Signed)
 Transition of Care [facility]) - Progression Note    Patient Details  Name: Jeffery Ponce MRN: [id number] Date of Birth: Dec 03, 1946  Transition of Care [facility]) CM/SW Contact  [place], [place] Phone Number: 06/24/2024, 1:41 PM  Clinical Narrative:     CSW ordered DME through [employer]. [facility] has been delivered. The RW will be delivered later today.         Expected Discharge Plan and Services                                               Social Determinants of Health (SDOH) Interventions SDOH Screenings   Food Insecurity: No Food Insecurity (06/23/2024)  Housing: Low Risk  (06/23/2024)  Transportation Needs: No Transportation Needs (06/23/2024)  Utilities: Not At Risk (06/23/2024)  Alcohol Screen: Low Risk  (01/15/2021)  Depression (PHQ2-9): Medium Risk (01/15/2021)  Financial Resource Strain: Medium Risk (01/15/2021)  Physical Activity: Sufficiently Active (01/15/2021)  Social Connections: Unknown (06/23/2024)  Stress: Stress Concern Present (01/15/2021)  Tobacco Use: Medium Risk (06/23/2024)    Readmission Risk Interventions    06/24/2024   11:26 AM  Readmission Risk Prevention Plan  Transportation Screening Complete  PCP or Specialist Appt within 3-5 Days Complete  HRI or Home Care Consult Complete  Social Work Consult for Recovery Care Planning/Counseling Complete  Palliative Care Screening Not Applicable  Medication Review Oceanographer) Complete

## 2024-06-24 NOTE — Plan of Care (Signed)
  Problem: Education: Goal: Knowledge of General Education information will improve Description: Including pain rating scale, medication(s)/side effects and non-pharmacologic comfort measures Outcome: Progressing   Problem: Clinical Measurements: Goal: Ability to maintain clinical measurements within normal limits will improve Outcome: Progressing Goal: Will remain free from infection Outcome: Progressing Goal: Cardiovascular complication will be avoided Outcome: Progressing   Problem: Coping: Goal: Level of anxiety will decrease Outcome: Progressing   Problem: Activity: Goal: Risk for activity intolerance will decrease Outcome: Not Progressing   Problem: Pain Managment: Goal: General experience of comfort will improve and/or be controlled Outcome: Not Progressing

## 2024-06-24 NOTE — Progress Notes (Signed)
 Patient is not able to walk the distance required to go the bathroom, or he/she is unable to safely negotiate stairs required to access the bathroom.  A 3in1 BSC will alleviate this problem

## 2024-06-24 NOTE — TOC Initial Note (Signed)
 Transition of Care [facility]) - Initial/Assessment Note    Patient Details  Name: Jeffery Ponce MRN: [id number] Date of Birth: [birth year]  Transition of Care [facility]) CM/SW Contact:    [place] L Malasha Kleppe, LCSW Phone Number: 06/24/2024, 11:37 AM  Clinical Narrative:                  CSW met with patient for a brief assessment. Patient resides with his spouse. Upon discharge, patient stated that his spouse and his son will provide transportation.   Patient advised that he does not have any DME at home. He stated that prior to hospitalization, he and his wife were in the garden growing corn and tomatoes.   Patient was not receiving any home health services at home.   Prescriptions are filled at [facility] in [place]. He reported that he did not have any issues that prevented him from affording his so-pays.   Per chart review, OT is recommending that patient receives a BSC. There are no other TOC needs being reported at this time. TOC will continue to follow this patient.        Patient Goals and CMS Choice            Expected Discharge Plan and Services                                              Prior Living Arrangements/Services                       Activities of Daily Living   ADL Screening (condition at time of admission) Independently performs ADLs?: Yes (appropriate for developmental age) Is the patient deaf or have difficulty hearing?: No Does the patient have difficulty seeing, even when wearing glasses/contacts?: No Does the patient have difficulty concentrating, remembering, or making decisions?: No  Permission Sought/Granted                  Emotional Assessment              Admission diagnosis:  Atherosclerosis of native artery of both lower extremities with bilateral ulceration, unspecified ulceration site (HCC) [P29.760, [name], L97.909] Patient Active  Problem List   Diagnosis Date Noted   Atherosclerosis of artery of extremity with ulceration (HCC) 06/23/2024   Pneumonia 09/11/2023   COPD (chronic obstructive pulmonary disease) (HCC) 09/11/2023   CKD (chronic kidney disease), stage IV (HCC) 09/11/2023   Anemia in stage 4 chronic kidney disease (HCC) 09/08/2023   Loss of weight 07/21/2023   Decreased appetite 07/21/2023   Liver cirrhosis (HCC) 07/21/2023   Liver disease, unspecified 01/20/2023   Constipation 06/04/2022   Abnormal CT of liver 01/29/2021   Iron  deficiency anemia due to chronic blood loss 12/21/2019   Absolute anemia 11/26/2019   Heme + stool 10/05/2019   Atherosclerosis of native arteries of extremity with intermittent claudication (HCC) 11/27/2018   Acute blood loss anemia    GI (gastrointestinal bleed) 11/13/2018   Hematochezia    GIB (gastrointestinal bleeding) 11/12/2018   Severe anemia    UGI bleed    Gastroesophageal reflux disease    Antiplatelet or antithrombotic long-term use    NSAID long-term use    Lower abdominal pain    AKI (acute kidney injury) (HCC)    PAD (peripheral artery disease) (  HCC) 08/25/2018   Chronic renal disease, stage 3, moderately decreased glomerular filtration rate (GFR) between 30-59 mL/min/1.73 square meter (HCC) 11/28/2017   History of ileal conduit 07/30/2017   Bladder cancer (HCC) 07/29/2017   Tobacco abuse 04/15/2017   Other insomnia 04/15/2017   [name] 04/15/2017   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 [place] [address] Phone: [phone] Fax: [phone]  [facility] [phone] - [place], [place] - [address] AT [facility] [address] [place] [address] Phone: [phone] Fax: [phone]     Social Drivers of Health (SDOH) Social History: SDOH Screenings   Food  Insecurity: No Food Insecurity (06/23/2024)  Housing: Low Risk  (06/23/2024)  Transportation Needs: No Transportation Needs (06/23/2024)  Utilities: Not At Risk (06/23/2024)  Alcohol Screen: Low Risk  (01/15/2021)  Depression (PHQ2-9): Medium Risk (01/15/2021)  Financial Resource Strain: Medium Risk (01/15/2021)  Physical Activity: Sufficiently Active (01/15/2021)  Social Connections: Unknown (06/23/2024)  Stress: Stress Concern Present (01/15/2021)  Tobacco Use: Medium Risk (06/23/2024)   SDOH Interventions:     Readmission Risk Interventions    06/24/2024   11:26 AM  Readmission Risk Prevention Plan  Transportation Screening Complete  PCP or Specialist Appt within 3-5 Days Complete  HRI or Home Care Consult Complete  Social Work Consult for Recovery Care Planning/Counseling Complete  Palliative Care Screening Not Applicable  Medication Review Oceanographer) Complete

## 2024-06-24 NOTE — Evaluation (Signed)
 Occupational Therapy Evaluation Patient Details Name: Jeffery Ponce MRN: [id number] DOB: May 04, 1946 Today's Date: 06/24/2024   History of Present Illness   Pt is a 78 year old male s/p bilateral femoral endarterectomy on 06/23/24  PMH significant for CAD, diastolic dysfunction, PAD with intermittent claudication, aortic murmur, aortic atherosclerosis, HTN, HLD, secondary hyperparathyroidism of renal origin, CKD-IV, COPD, GERD (no daily Tx), cirrhosis, anemia of chronic renal disease, nephrolithiasis, OA, chronic opioid therapy     Clinical Impressions Chart reviewed to date, pt greeted semi supine in bed, agreeable to OT evaluation. PTA pt is MOD I-I in ADL/IADL, amb with no AD. Pt presents with deficits in strength, endurance, activity tolerance, balance, affecting safe and optimal ADL completion. Bed mobility completed with CGA, STS with RW with CGA-MIN A, step pivot to bedsides chair with CGA-MIN A. LB dressing completed with MAX A. Pt is performing ADL below PLOF, will benefit from acute OT address functional deficits and to facilitate optimal ADL performance. Pt is left in chair, all needs met. OT will continue to follow.     If plan is discharge home, recommend the following:   A little help with walking and/or transfers;A little help with bathing/dressing/bathroom;Help with stairs or ramp for entrance;Assistance with cooking/housework;Assist for transportation     Functional Status Assessment   Patient has had a recent decline in their functional status and demonstrates the ability to make significant improvements in function in a reasonable and predictable amount of time.     Equipment Recommendations   BSC/3in1;Tub/shower seat     Recommendations for Other Services         Precautions/Restrictions   Precautions Precautions: Fall Recall of Precautions/Restrictions: Intact Restrictions Weight Bearing Restrictions Per Provider Order: No     Mobility Bed  Mobility Overal bed mobility: Needs Assistance Bed Mobility: Supine to Sit     Supine to sit: Contact guard, HOB elevated, Used rails     General bed mobility comments: intermittent vcs for technique    Transfers Overall transfer level: Needs assistance Equipment used: Rolling walker (2 wheels) Transfers: Sit to/from Stand Sit to Stand: Contact guard assist, Min assist                  Balance Overall balance assessment: Needs assistance Sitting-balance support: Feet supported Sitting balance-Leahy Scale: Good     Standing balance support: Bilateral upper extremity supported, During functional activity, Reliant on assistive device for balance Standing balance-Leahy Scale: Fair                             ADL either performed or assessed with clinical judgement   ADL Overall ADL's : Needs assistance/impaired Eating/Feeding: Set up;Sitting   Grooming: Set up;Sitting               Lower Body Dressing: Maximal assistance Lower Body Dressing Details (indicate cue type and reason): donn socks Toilet Transfer: Contact guard assist;Minimal assistance;Rolling walker (2 wheels) Toilet Transfer Details (indicate cue type and reason): simualted, step pivot to bedside chair, intermittent vcs for technique                 Vision Patient Visual Report: No change from baseline       Perception         Praxis         Pertinent Vitals/Pain Pain Assessment Pain Assessment: 0-10 Pain Score: 10-Worst pain ever Pain Location: LLE Pain Descriptors / Indicators: Discomfort, Aching  Pain Intervention(s): Monitored during session, Premedicated before session, Repositioned, Limited activity within patient's tolerance (notified RN)     Extremity/Trunk Assessment Upper Extremity Assessment Upper Extremity Assessment: Overall WFL for tasks assessed   Lower Extremity Assessment Lower Extremity Assessment: Generalized weakness       Communication  Communication Communication: No apparent difficulties   Cognition Arousal: Alert Behavior During Therapy: WFL for tasks assessed/performed Cognition: No apparent impairments                               Following commands: Intact       Cueing  General Comments   Cueing Techniques: Verbal cues  vss throughout, B femoral incisions appear intact under honeycomb dressing   Exercises Other Exercises Other Exercises: edu re: role of OT, role of rehab, discharge recommendations, home safety, falls prevention, safe ADL with DME   Shoulder Instructions      Home Living Family/patient expects to be discharged to:: Private residence Living Arrangements: Spouse/significant other Available Help at Discharge: Family Type of Home: House Home Access: Stairs to enter Secretary/administrator of Steps: 4 Entrance Stairs-Rails: Can reach both Home Layout: One level     Bathroom Shower/Tub: Chief Strategy Officer: Handicapped height Bathroom Accessibility: Yes   Home Equipment: None          Prior Functioning/Environment Prior Level of Function : Independent/Modified Independent;Driving             Mobility Comments: amb community distances including tending to garden, 2 acre property ADLs Comments: MOD I-I in ADL/IADL    OT Problem List: Decreased activity tolerance;Decreased knowledge of use of DME or AE;Decreased safety awareness;Impaired vision/perception;Decreased strength;Impaired balance (sitting and/or standing)   OT Treatment/Interventions: Self-care/ADL training;Visual/perceptual remediation/compensation;Therapeutic exercise;Patient/family education;Balance training;Energy conservation;Therapeutic activities;DME and/or AE instruction      OT Goals(Current goals can be found in the care plan section)   Acute Rehab OT Goals Patient Stated Goal: return to his garden OT Goal Formulation: With patient Time For Goal Achievement:  07/08/24 Potential to Achieve Goals: Good ADL Goals Pt Will Perform Grooming: with modified independence;sitting;standing Pt Will Perform Lower Body Dressing: with modified independence;sitting/lateral leans;sit to/from stand Pt Will Transfer to Toilet: with modified independence;ambulating Pt Will Perform Toileting - Clothing Manipulation and hygiene: with modified independence;sitting/lateral leans;sit to/from stand   OT Frequency:  Min 3X/week    Co-evaluation              AM-PAC OT 6 Clicks Daily Activity     Outcome Measure Help from another person eating meals?: None Help from another person taking care of personal grooming?: None Help from another person toileting, which includes using toliet, bedpan, or urinal?: A Little Help from another person bathing (including washing, rinsing, drying)?: A Lot Help from another person to put on and taking off regular upper body clothing?: None Help from another person to put on and taking off regular lower body clothing?: A Lot 6 Click Score: 19   End of Session Equipment Utilized During Treatment: Rolling walker (2 wheels) Nurse Communication: Mobility status;Patient requests pain meds  Activity Tolerance: Patient tolerated treatment well Patient left: in chair;with call bell/phone within reach  OT Visit Diagnosis: Other abnormalities of gait and mobility (R26.89);Muscle weakness (generalized) (M62.81)                Time: 9083-9059 OT Time Calculation (min): 24 min Charges:  OT General Charges $OT Visit: 1 Visit  OT Evaluation $OT Eval High Complexity: 1 High  Therisa Sheffield, OTD OTR/L  06/24/24, 10:37 AM

## 2024-06-24 NOTE — Progress Notes (Signed)
  Progress Note    06/24/2024 7:18 AM 1 Day Post-Op  Subjective:  Jeffery Ponce is a 78 yo male now POD #1 from:  PROCEDURE: Bilateral common femoral and superficial femoral  endarterectomy with bovine pericardial patch angioplasty. Bilateral profunda femoris endarterectomy with bovine pericardial patch angioplasty (completely separate and distinct second patch used for each profunda femoris repair) Open angioplasty and stent placement of the right common and external iliac arteries. Open angioplasty and stent placement of the left common and external iliac arteries.  Patient is resting comfortably in ICU this morning.  He does endorse some pain to his left groin site and endorses right groin.  Patient has not ambulated at this point.  He does endorse that his legs feel better overall and his feet feel well.  No other complaints overnight vitals are remained stable.   Vitals:   06/24/24 0500 06/24/24 0600  BP: (!) 111/55 (!) 134/58  Pulse: 75 72  Resp: 19 19  Temp:    SpO2: 98% 97%   Physical Exam: Cardiac:  RRR, Normal S1, S2 with Murmur Lungs:  Clear throughout on auscultation, no rales rhonchi or wheezing Incisions: Bilateral groin incisions with staples intact.  Dressings clean dry and intact.  Slight seroma noted under the skin to the left groin area. Extremities: Bilateral lower extremities warm to touch this morning.  Strong Doppler pulses DP and PT bilaterally Abdomen: Positive bowel sounds throughout, soft, nontender and nondistended Neurologic: Alert and oriented x 3, answers all questions and follows commands appropriately.  CBC    Component Value Date/Time   WBC 18.7 (H) 06/24/2024 0316   RBC 2.45 (L) 06/24/2024 0316   HGB 7.1 (L) 06/24/2024 0316   HCT 22.7 (L) 06/24/2024 0316   PLT 260 06/24/2024 0316   MCV 92.7 06/24/2024 0316   MCH 29.0 06/24/2024 0316   MCHC 31.3 06/24/2024 0316   RDW 13.7 06/24/2024 0316   LYMPHSABS 1.4 06/10/2024 1050   MONOABS 0.5  06/10/2024 1050   EOSABS 0.2 06/10/2024 1050   BASOSABS 0.0 06/10/2024 1050    BMET    Component Value Date/Time   NA 140 06/24/2024 0316   NA 139 12/19/2021 0918   K 5.8 (H) 06/24/2024 0316   CL 115 (H) 06/24/2024 0316   CO2 18 (L) 06/24/2024 0316   GLUCOSE 121 (H) 06/24/2024 0316   BUN 50 (H) 06/24/2024 0316   BUN 21 12/19/2021 0918   CREATININE 2.31 (H) 06/24/2024 0316   CALCIUM  8.2 (L) 06/24/2024 0316   GFRNONAA 28 (L) 06/24/2024 0316   GFRAA 37 (L) 06/20/2020 1008    INR    Component Value Date/Time   INR 1.0 03/16/2024 1148     Intake/Output Summary (Last 24 hours) at 06/24/2024 0718 Last data filed at 06/24/2024 0400 Gross per 24 hour  Intake 3324.87 ml  Output 2010 ml  Net 1314.87 ml     Assessment/Plan:  78 y.o. male is s/p SEE ABOVE 1 Day Post-Op   PLAN Patient to receive 1 L normal saline bolus for elevated potassium to 5.8.  Repeat potassium level be drawn at noon time to follow-up. Pain medication as needed Advance diet as tolerated OOB to the chair for 4 hours today. Empty patient's urostomy every 4 hours and record.  DVT prophylaxis: ASA 81 mg daily, Plavix  75 mg daily, Lipitor 10 mg daily.   Gwendlyn JONELLE [facility]ank [facility] 06/24/2024 7:18 AM

## 2024-06-24 NOTE — Evaluation (Signed)
 Physical Therapy Evaluation Patient Details Name: Jeffery Ponce MRN: [id number] DOB: 07-10-1946 Today's Date: 06/24/2024  History of Present Illness  Pt is a 78 y/o M admitted on 06/23/24 for scheduled B femoral endarterectomy. PMH: CKD 3B, tobacco abuse  Clinical Impression  Pt seen for PT evaluation with pt agreeable with encouragement, does note fatigue & recently returned to bed, as well as ongoing 10/10 pain in LLE. Pt is able to complete bed mobility with min assist, sit>stand with supervision with cuing re: hand placement. Pt ambulates short distance with RW & supervision, steady balance with RW, gait distance limited by LLE pain. Will continue to follow pt acutely to progress gait & for stair negotiation as able.        If plan is discharge home, recommend the following: A little help with walking and/or transfers;Assistance with cooking/housework;A little help with bathing/dressing/bathroom;Assist for transportation;Help with stairs or ramp for entrance   Can travel by private vehicle        Equipment Recommendations Rolling walker (2 wheels)  Recommendations for Other Services       Functional Status Assessment Patient has had a recent decline in their functional status and demonstrates the ability to make significant improvements in function in a reasonable and predictable amount of time.     Precautions / Restrictions Precautions Precautions: Fall Restrictions Weight Bearing Restrictions Per Provider Order: No      Mobility  Bed Mobility Overal bed mobility: Needs Assistance Bed Mobility: Supine to Sit, Sit to Supine     Supine to sit: HOB elevated, Used rails, Min assist Sit to supine: Min assist, HOB elevated, Used rails (assistance to elevate LLE onto bed)        Transfers Overall transfer level: Needs assistance Equipment used: Rolling walker (2 wheels) Transfers: Sit to/from Stand Sit to Stand: Supervision           General transfer comment:  cuing re: hand placement    Ambulation/Gait Ambulation/Gait assistance: Supervision Gait Distance (Feet): 20 Feet Assistive device: Rolling walker (2 wheels) Gait Pattern/deviations: Decreased step length - right, Decreased step length - left, Decreased stride length Gait velocity: decreased        Stairs            Wheelchair Mobility     Tilt Bed    Modified Rankin (Stroke Patients Only)       Balance Overall balance assessment: Needs assistance Sitting-balance support: Feet supported Sitting balance-Leahy Scale: Good     Standing balance support: During functional activity, Bilateral upper extremity supported, Reliant on assistive device for balance Standing balance-Leahy Scale: Fair                               Pertinent Vitals/Pain Pain Assessment Pain Assessment: 0-10 Pain Score: 10-Worst pain ever Pain Location: LLE Pain Descriptors / Indicators: Discomfort, Aching Pain Intervention(s): Monitored during session    Home Living Family/patient expects to be discharged to:: Private residence Living Arrangements: Spouse/significant other Available Help at Discharge: Family Type of Home: House Home Access: Stairs to enter Entrance Stairs-Rails: Right;Left;Can reach both Secretary/administrator of Steps: 4   Home Layout: One level Home Equipment: None      Prior Function Prior Level of Function : Independent/Modified Independent;Driving             Mobility Comments: amb community distances including tending to garden, 2 acre property ADLs Comments: MOD I-I in ADL/IADL  Extremity/Trunk Assessment   Upper Extremity Assessment Upper Extremity Assessment: Overall WFL for tasks assessed    Lower Extremity Assessment Lower Extremity Assessment: Generalized weakness    Cervical / Trunk Assessment Cervical / Trunk Assessment: Normal  Communication   Communication Communication: No apparent difficulties    Cognition  Arousal: Alert Behavior During Therapy: WFL for tasks assessed/performed   PT - Cognitive impairments: No apparent impairments                         Following commands: Intact       Cueing Cueing Techniques: Verbal cues     General Comments General comments (skin integrity, edema, etc.): vss throughout, B femoral incisions appear intact under honeycomb dressing    Exercises     Assessment/Plan    PT Assessment Patient needs continued PT services  PT Problem List Decreased strength;Pain;Decreased range of motion;Decreased activity tolerance;Decreased balance;Decreased mobility;Decreased knowledge of use of DME;Decreased safety awareness;Decreased knowledge of precautions       PT Treatment Interventions DME instruction;Balance training;Gait training;Neuromuscular re-education;Stair training;Functional mobility training;Therapeutic activities;Therapeutic exercise;Manual techniques;Patient/family education    PT Goals (Current goals can be found in the Care Plan section)  Acute Rehab PT Goals Patient Stated Goal: decreased pain, get better PT Goal Formulation: With patient Time For Goal Achievement: 07/08/24 Potential to Achieve Goals: Good    Frequency Min 2X/week     Co-evaluation               AM-PAC PT 6 Clicks Mobility  Outcome Measure Help needed turning from your back to your side while in a flat bed without using bedrails?: None Help needed moving from lying on your back to sitting on the side of a flat bed without using bedrails?: A Little Help needed moving to and from a bed to a chair (including a wheelchair)?: A Little Help needed standing up from a chair using your arms (e.g., wheelchair or bedside chair)?: A Little Help needed to walk in hospital room?: A Little Help needed climbing 3-5 steps with a railing? : A Little 6 Click Score: 19    End of Session   Activity Tolerance: Patient limited by fatigue;Patient limited by pain Patient  left: in bed;with call bell/phone within reach;with bed alarm set Nurse Communication: Mobility status PT Visit Diagnosis: Muscle weakness (generalized) (M62.81);Other abnormalities of gait and mobility (R26.89);Pain Pain - Right/Left: Left Pain - part of body: Leg    Time: 8855-8841 PT Time Calculation (min) (ACUTE ONLY): 14 min   Charges:   PT Evaluation $PT Eval Low Complexity: 1 Low   PT General Charges $$ ACUTE PT VISIT: 1 Visit         Jeffery Ponce, PT, DPT 06/24/24, 12:30 PM   Jeffery Ponce 06/24/2024, 12:29 PM

## 2024-06-24 NOTE — Progress Notes (Signed)
 Date and time results received: 06/24/24 430  Test: potassium Value: 5.8   Name of Provider Notified: Dr. Jama  Orders Received? Or Actions Taken?: Repeat BMP 06/24/24 at [address] [place], [place] 06/24/24 4:56 AM

## 2024-06-24 NOTE — Plan of Care (Signed)
  Problem: Education: Goal: Knowledge of General Education information will improve Description: Including pain rating scale, medication(s)/side effects and non-pharmacologic comfort measures Outcome: Progressing   Problem: Health Behavior/Discharge Planning: Goal: Ability to manage health-related needs will improve Outcome: Progressing   Problem: Clinical Measurements: Goal: Ability to maintain clinical measurements within normal limits will improve Outcome: Progressing Goal: Will remain free from infection Outcome: Progressing Goal: Diagnostic test results will improve Outcome: Progressing Goal: Cardiovascular complication will be avoided Outcome: Progressing   Problem: Activity: Goal: Risk for activity intolerance will decrease Outcome: Progressing   Problem: Nutrition: Goal: Adequate nutrition will be maintained Outcome: Progressing   Problem: Pain Managment: Goal: General experience of comfort will improve and/or be controlled Outcome: Progressing

## 2024-06-25 LAB — TYPE AND SCREEN
ABO/RH(D): A POS
Antibody Screen: NEGATIVE
Unit division: 0
Unit division: 0

## 2024-06-25 LAB — CBC
HCT: 18.7 % — ABNORMAL LOW (ref 39.0–52.0)
Hemoglobin: 5.9 g/dL — ABNORMAL LOW (ref 13.0–17.0)
MCH: 29.4 pg (ref 26.0–34.0)
MCHC: 31.6 g/dL (ref 30.0–36.0)
MCV: 93 fL (ref 80.0–100.0)
Platelets: 214 K/uL (ref 150–400)
RBC: 2.01 MIL/uL — ABNORMAL LOW (ref 4.22–5.81)
RDW: 14.3 % (ref 11.5–15.5)
WBC: 12.9 K/uL — ABNORMAL HIGH (ref 4.0–10.5)
nRBC: 0 % (ref 0.0–0.2)

## 2024-06-25 LAB — BASIC METABOLIC PANEL WITH GFR
Anion gap: 8 (ref 5–15)
BUN: 58 mg/dL — ABNORMAL HIGH (ref 8–23)
CO2: 17 mmol/L — ABNORMAL LOW (ref 22–32)
Calcium: 8.6 mg/dL — ABNORMAL LOW (ref 8.9–10.3)
Chloride: 114 mmol/L — ABNORMAL HIGH (ref 98–111)
Creatinine, Ser: 2.13 mg/dL — ABNORMAL HIGH (ref 0.61–1.24)
GFR, Estimated: 31 mL/min — ABNORMAL LOW (ref >60)
Glucose, Bld: 118 mg/dL — ABNORMAL HIGH (ref 70–99)
Potassium: 5.2 mmol/L — ABNORMAL HIGH (ref 3.5–5.1)
Sodium: 139 mmol/L (ref 135–145)

## 2024-06-25 LAB — BPAM RBC
Blood Product Expiration Date: 202508172359
Blood Product Expiration Date: 202508172359
Unit Type and Rh: 6200
Unit Type and Rh: 6200

## 2024-06-25 LAB — PREPARE RBC (CROSSMATCH)

## 2024-06-25 LAB — HEMOGLOBIN AND HEMATOCRIT, BLOOD
HCT: 24.8 % — ABNORMAL LOW (ref 39.0–52.0)
Hemoglobin: 8.1 g/dL — ABNORMAL LOW (ref 13.0–17.0)

## 2024-06-25 MED ORDER — POLYETHYLENE GLYCOL 3350 17 G PO PACK
17.0000 g | PACK | Freq: Two times a day (BID) | ORAL | Status: DC
  Administered 2024-06-25 – 2024-07-02 (×10): 17 g via ORAL
  Filled 2024-06-25 (×12): qty 1

## 2024-06-25 MED ORDER — SODIUM CHLORIDE 0.9% IV SOLUTION: Freq: Once | INTRAVENOUS | Status: AC

## 2024-06-25 NOTE — Progress Notes (Signed)
 Pt transferred to room 229 at this time. VSS prior to transfer.

## 2024-06-25 NOTE — Progress Notes (Signed)
 Physical Therapy Treatment Patient Details Name: Jeffery Ponce MRN: [id number] DOB: Aug 18, 1946 Today's Date: 06/25/2024   History of Present Illness Pt is a 78 year old male s/p bilateral femoral endarterectomy on 06/23/24  PMH significant for CAD, diastolic dysfunction, PAD with intermittent claudication, aortic murmur, aortic atherosclerosis, HTN, HLD, secondary hyperparathyroidism of renal origin, CKD-IV, COPD, GERD (no daily Tx), cirrhosis, anemia of chronic renal disease, nephrolithiasis, OA, chronic opioid therapy    PT Comments  Patient alert, up in recliner eager to get back to bed but agreeable to ambulation prior. Able to stand from recliner, CGA with RW. He was able to ambulate ~89ft with RW and CGA, close chair follow for safety. Referenced 10/10 groin pain, RN was in room and gave pain medication upon PT arrival. No LOB, but decreased gait velocity. minA for assist for RLE to return to supine. Pt/PT discussed some strategies upon returning home to maximize safety and mobility, including seeing if his son can be present for a few days. The patient would benefit from further skilled PT intervention to continue to progress towards goals.     If plan is discharge home, recommend the following: A little help with walking and/or transfers;Assistance with cooking/housework;A little help with bathing/dressing/bathroom;Assist for transportation;Help with stairs or ramp for entrance   Can travel by private vehicle        Equipment Recommendations  Rolling walker (2 wheels)    Recommendations for Other Services       Precautions / Restrictions Precautions Precautions: Fall Recall of Precautions/Restrictions: Intact Restrictions Weight Bearing Restrictions Per Provider Order: No     Mobility  Bed Mobility Overal bed mobility: Needs Assistance Bed Mobility: Sit to Supine       Sit to supine: Min assist   General bed mobility comments: minA for light BLE assist back into bed     Transfers Overall transfer level: Needs assistance Equipment used: Rolling walker (2 wheels) Transfers: Sit to/from Stand Sit to Stand: Contact guard assist                Ambulation/Gait Ambulation/Gait assistance: Contact guard assist Gait Distance (Feet): 55 Feet Assistive device: Rolling walker (2 wheels) Gait Pattern/deviations: Decreased step length - right, Decreased step length - left, Decreased stride length Gait velocity: decreased     General Gait Details: chair follow for safety   Stairs             Wheelchair Mobility     Tilt Bed    Modified Rankin (Stroke Patients Only)       Balance Overall balance assessment: Needs assistance Sitting-balance support: Feet supported Sitting balance-Leahy Scale: Good     Standing balance support: During functional activity, Bilateral upper extremity supported, Reliant on assistive device for balance Standing balance-Leahy Scale: Fair                              Hotel manager: No apparent difficulties  Cognition Arousal: Alert Behavior During Therapy: WFL for tasks assessed/performed   PT - Cognitive impairments: No apparent impairments                         Following commands: Intact      Cueing Cueing Techniques: Verbal cues  Exercises      General Comments General comments (skin integrity, edema, etc.): vss throughout, B fem incisions appear intact under honeycomb dressing; R IV line noted with  some blood around it, on gown; unchanged during mobility and pt reports nurse is planning to come in to address, nurse notified via secure chat; L IV with blood running intact pre/post session      Pertinent Vitals/Pain Pain Assessment Pain Assessment: 0-10 Pain Score: 10-Worst pain ever Pain Location: BLE with mobility Pain Descriptors / Indicators: Discomfort, Aching, Burning Pain Intervention(s): Limited activity within patient's tolerance,  Monitored during session, Repositioned    Home Living                          Prior Function            PT Goals (current goals can now be found in the care plan section) Progress towards PT goals: Progressing toward goals    Frequency    Min 2X/week      PT Plan      Co-evaluation              AM-PAC PT 6 Clicks Mobility   Outcome Measure  Help needed turning from your back to your side while in a flat bed without using bedrails?: None Help needed moving from lying on your back to sitting on the side of a flat bed without using bedrails?: A Little Help needed moving to and from a bed to a chair (including a wheelchair)?: A Little Help needed standing up from a chair using your arms (e.g., wheelchair or bedside chair)?: A Little Help needed to walk in hospital room?: A Little Help needed climbing 3-5 steps with a railing? : A Little 6 Click Score: 19    End of Session Equipment Utilized During Treatment: Gait belt Activity Tolerance: Patient tolerated treatment well Patient left: in bed;with call bell/phone within reach;with bed alarm set Nurse Communication: Mobility status PT Visit Diagnosis: Muscle weakness (generalized) (M62.81);Other abnormalities of gait and mobility (R26.89);Pain Pain - Right/Left: Left Pain - part of body: Leg     Time: 8573-8561 PT Time Calculation (min) (ACUTE ONLY): 12 min  Charges:    $Therapeutic Activity: 8-22 mins PT General Charges $$ ACUTE PT VISIT: 1 Visit                     Doyal Shams PT, DPT 3:07 PM,06/25/24

## 2024-06-25 NOTE — Care Management Important Message (Signed)
 Important Message  Patient Details  Name: Jeffery Ponce MRN: [id number] Date of Birth: 1946/12/01   Important Message Given:  Yes - Medicare IM     [name] 06/25/2024, 12:10 PM

## 2024-06-25 NOTE — Progress Notes (Signed)
 Occupational Therapy Treatment Patient Details Name: Jeffery Ponce MRN: [id number] DOB: June 21, 1946 Today's Date: 06/25/2024   History of present illness Pt is a 78 year old male s/p bilateral femoral endarterectomy on 06/23/24  PMH significant for CAD, diastolic dysfunction, PAD with intermittent claudication, aortic murmur, aortic atherosclerosis, HTN, HLD, secondary hyperparathyroidism of renal origin, CKD-IV, COPD, GERD (no daily Tx), cirrhosis, anemia of chronic renal disease, nephrolithiasis, OA, chronic opioid therapy   OT comments  Chart reviewed to date, pt greeted in bed, agreeable to OT tx session targeting improving functional activity tolerance in prep for ADL tasks. Pt continues to be limited by pain (although he was pre medicated). Per nurse, 2nd unit of PRBCs running and vss throughout. MIN A is required for bed mobility, STS with CGA with RW short amb transfer with RW with CGA. Educated pt importance of progressing mobility attempts for improved outcomes. Pt is left in bedside chair, all needs met. OT will continue to follow.       If plan is discharge home, recommend the following:  A little help with walking and/or transfers;A little help with bathing/dressing/bathroom;Help with stairs or ramp for entrance;Assistance with cooking/housework;Assist for transportation   Equipment Recommendations  BSC/3in1;Tub/shower seat    Recommendations for Other Services      Precautions / Restrictions Precautions Precautions: Fall Recall of Precautions/Restrictions: Intact Restrictions Weight Bearing Restrictions Per Provider Order: No       Mobility Bed Mobility Overal bed mobility: Needs Assistance Bed Mobility: Supine to Sit     Supine to sit: Min assist, HOB elevated, Used rails     General bed mobility comments: intermittent vcs for technique    Transfers Overall transfer level: Needs assistance Equipment used: Rolling walker (2 wheels) Transfers: Sit to/from Stand,  Bed to chair/wheelchair/BSC Sit to Stand: Contact guard assist     Step pivot transfers: Contact guard assist     General transfer comment: intermittent vcs for technique     Balance Overall balance assessment: Needs assistance Sitting-balance support: Feet supported Sitting balance-Leahy Scale: Good     Standing balance support: During functional activity, Bilateral upper extremity supported, Reliant on assistive device for balance Standing balance-Leahy Scale: Fair                             ADL either performed or assessed with clinical judgement   ADL Overall ADL's : Needs assistance/impaired                                       General ADL Comments: SETUP for feeding/grooming tasks, MAX A for LB dressing    Extremity/Trunk Assessment              Vision       Perception     Praxis     Communication Communication Communication: No apparent difficulties   Cognition Arousal: Alert Behavior During Therapy: WFL for tasks assessed/performed Cognition: No apparent impairments                               Following commands: Intact        Cueing   Cueing Techniques: Verbal cues  Exercises Other Exercises Other Exercises: edu re: role of OT, importance of progressing mobility efforts    Shoulder Instructions  General Comments vss throughout, B fem incisions appear intact under honeycomb dressing; R IV line noted with some blood around it, on gown; unchanged during mobility and pt reports nurse is planning to come in to address, nurse notified via secure chat; L IV with blood running intact pre/post session    Pertinent Vitals/ Pain       Pain Assessment Pain Assessment: 0-10 Pain Score: 10-Worst pain ever Pain Location: BLE with mobility Pain Descriptors / Indicators: Discomfort, Aching, Burning Pain Intervention(s): Monitored during session, Premedicated before session, Repositioned, Limited activity  within patient's tolerance  Home Living                                          Prior Functioning/Environment              Frequency  Min 3X/week        Progress Toward Goals  OT Goals(current goals can now be found in the care plan section)  Progress towards OT goals: Progressing toward goals  Acute Rehab OT Goals Time For Goal Achievement: 07/08/24  Plan      Co-evaluation                 AM-PAC OT 6 Clicks Daily Activity     Outcome Measure   Help from another person eating meals?: None Help from another person taking care of personal grooming?: None Help from another person toileting, which includes using toliet, bedpan, or urinal?: A Little Help from another person bathing (including washing, rinsing, drying)?: A Lot Help from another person to put on and taking off regular upper body clothing?: None Help from another person to put on and taking off regular lower body clothing?: A Lot 6 Click Score: 19    End of Session Equipment Utilized During Treatment: Rolling walker (2 wheels)  OT Visit Diagnosis: Other abnormalities of gait and mobility (R26.89);Muscle weakness (generalized) (M62.81)   Activity Tolerance Patient tolerated treatment well   Patient Left in chair;with call bell/phone within reach   Nurse Communication Mobility status        Time: 8696-8679 OT Time Calculation (min): 17 min  Charges: OT General Charges $OT Visit: 1 Visit OT Treatments $Therapeutic Activity: 8-22 mins  Therisa Sheffield, OTD OTR/L  06/25/24, 2:06 PM

## 2024-06-25 NOTE — TOC Progression Note (Signed)
 Transition of Care [facility]) - Progression Note    Patient Details  Name: Jeffery Ponce MRN: [id number] Date of Birth: [birth year]  [facility] CM/SW Contact  [place] L L[place], [place] Phone Number: 06/25/2024, 1:37 PM  Clinical Narrative:     Home Health services set up with [facility]. CSW spoke with Channing who confirmed that her agency was available to provide patient with PT/OT services.        Expected Discharge Plan and Services                                               Social Determinants of Health (SDOH) Interventions SDOH Screenings   Food Insecurity: No Food Insecurity (06/23/2024)  Housing: Low Risk  (06/23/2024)  Transportation Needs: No Transportation Needs (06/23/2024)  Utilities: Not At Risk (06/23/2024)  Alcohol Screen: Low Risk  (01/15/2021)  Depression (PHQ2-9): Medium Risk (01/15/2021)  Financial Resource Strain: Medium Risk (01/15/2021)  Physical Activity: Sufficiently Active (01/15/2021)  Social Connections: Unknown (06/23/2024)  Stress: Stress Concern Present (01/15/2021)  Tobacco Use: Medium Risk (06/23/2024)    Readmission Risk Interventions    06/24/2024   11:26 AM  Readmission Risk Prevention Plan  Transportation Screening Complete  PCP or Specialist Appt within 3-5 Days Complete  HRI or Home Care Consult Complete  Social Work Consult for Recovery Care Planning/Counseling Complete  Palliative Care Screening Not Applicable  Medication Review Oceanographer) Complete

## 2024-06-25 NOTE — Progress Notes (Signed)
  Progress Note    06/25/2024 8:33 AM 2 Days Post-Op  Subjective:  Jeffery Ponce is a 78 yo male now POD #2 from:   PROCEDURE: Bilateral common femoral and superficial femoral  endarterectomy with bovine pericardial patch angioplasty. Bilateral profunda femoris endarterectomy with bovine pericardial patch angioplasty (completely separate and distinct second patch used for each profunda femoris repair) Open angioplasty and stent placement of the right common and external iliac arteries. Open angioplasty and stent placement of the left common and external iliac arteries.   Patient is resting comfortably in ICU this morning.  He does endorse some pain to his left groin site and endorses right groin.  Patient has not ambulated at this point.  He does endorse that his legs feel better overall and his feet feel well.  No other complaints overnight vitals are remained stable.     Vitals:   06/25/24 0620 06/25/24 0700  BP: (!) 122/53 (!) 97/51  Pulse: 81 86  Resp: (!) 21 20  Temp:    SpO2: 100% 98%   Physical Exam: Cardiac:  RRR, Normal S1, S2 with Murmur Lungs:  Clear throughout on auscultation, no rales rhonchi or wheezing Incisions: Bilateral groin incisions with staples intact.  Dressings clean dry and intact.  Slight seroma noted under the skin to the left groin area. Extremities: Bilateral lower extremities warm to touch this morning.  Strong Doppler pulses DP and PT bilaterally Abdomen: Positive bowel sounds throughout, soft, nontender and nondistended Neurologic: Alert and oriented x 3, answers all questions and follows commands appropriately.  CBC    Component Value Date/Time   WBC 12.9 (H) 06/25/2024 0540   RBC 2.01 (L) 06/25/2024 0540   HGB 5.9 (L) 06/25/2024 0540   HCT 18.7 (L) 06/25/2024 0540   PLT 214 06/25/2024 0540   MCV 93.0 06/25/2024 0540   MCH 29.4 06/25/2024 0540   MCHC 31.6 06/25/2024 0540   RDW 14.3 06/25/2024 0540   LYMPHSABS 1.4 06/10/2024 1050   MONOABS  0.5 06/10/2024 1050   EOSABS 0.2 06/10/2024 1050   BASOSABS 0.0 06/10/2024 1050    BMET    Component Value Date/Time   NA 139 06/25/2024 0540   NA 139 12/19/2021 0918   K 5.2 (H) 06/25/2024 0540   CL 114 (H) 06/25/2024 0540   CO2 17 (L) 06/25/2024 0540   GLUCOSE 118 (H) 06/25/2024 0540   BUN 58 (H) 06/25/2024 0540   BUN 21 12/19/2021 0918   CREATININE 2.13 (H) 06/25/2024 0540   CALCIUM  8.6 (L) 06/25/2024 0540   GFRNONAA 31 (L) 06/25/2024 0540   GFRAA 37 (L) 06/20/2020 1008    INR    Component Value Date/Time   INR 1.0 03/16/2024 1148     Intake/Output Summary (Last 24 hours) at 06/25/2024 0833 Last data filed at 06/25/2024 0600 Gross per 24 hour  Intake 480 ml  Output 2075 ml  Net -1595 ml     Assessment/Plan:  78 y.o. male is s/p SEE ABOVE 2 Days Post-Op   PLAN  Give 2 units PRBC's today for Hgb of 5.9 Post Transfusion H&H  OOB to chair today Pain medication PRN Encourage to eat.    DVT prophylaxis:  ASA 81 mg daily, Plavix  75 mg Daily and Lovenox  30 mg Sq Q24.    Jeffery Ponce [facility] 06/25/2024 8:33 AM

## 2024-06-26 LAB — BASIC METABOLIC PANEL WITH GFR
Anion gap: 7 (ref 5–15)
BUN: 66 mg/dL — ABNORMAL HIGH (ref 8–23)
CO2: 18 mmol/L — ABNORMAL LOW (ref 22–32)
Calcium: 8.5 mg/dL — ABNORMAL LOW (ref 8.9–10.3)
Chloride: 115 mmol/L — ABNORMAL HIGH (ref 98–111)
Creatinine, Ser: 2.32 mg/dL — ABNORMAL HIGH (ref 0.61–1.24)
GFR, Estimated: 28 mL/min — ABNORMAL LOW (ref >60)
Glucose, Bld: 114 mg/dL — ABNORMAL HIGH (ref 70–99)
Potassium: 4.6 mmol/L (ref 3.5–5.1)
Sodium: 140 mmol/L (ref 135–145)

## 2024-06-26 LAB — CBC
HCT: 22.9 % — ABNORMAL LOW (ref 39.0–52.0)
Hemoglobin: 7.5 g/dL — ABNORMAL LOW (ref 13.0–17.0)
MCH: 29 pg (ref 26.0–34.0)
MCHC: 32.8 g/dL (ref 30.0–36.0)
MCV: 88.4 fL (ref 80.0–100.0)
Platelets: 176 K/uL (ref 150–400)
RBC: 2.59 MIL/uL — ABNORMAL LOW (ref 4.22–5.81)
RDW: 16.1 % — ABNORMAL HIGH (ref 11.5–15.5)
WBC: 12.5 K/uL — ABNORMAL HIGH (ref 4.0–10.5)
nRBC: 0 % (ref 0.0–0.2)

## 2024-06-26 NOTE — Progress Notes (Signed)
 Physical Therapy Treatment Patient Details Name: Jeffery Ponce MRN: [id number] DOB: 1945-12-27 Today's Date: 06/26/2024   History of Present Illness Pt is a 78 year old male s/p bilateral femoral endarterectomy on 06/23/24  PMH significant for CAD, diastolic dysfunction, PAD with intermittent claudication, aortic murmur, aortic atherosclerosis, HTN, HLD, secondary hyperparathyroidism of renal origin, CKD-IV, COPD, GERD (no daily Tx), cirrhosis, anemia of chronic renal disease, nephrolithiasis, OA, chronic opioid therapy    PT Comments  Pt progressing with gait ambulating with RW,  55' CGA, without chair following, demonstrating increase confidence and activity tolerance. Activity tolerance still is low with pt declining 2nd walk 2/2 fatigue and bil LE pain.  Pt will benefit from continued PT services upon discharge to safely address deficits listed in patient problem list for decreased caregiver assistance and eventual return to PLOF.     If plan is discharge home, recommend the following: A little help with walking and/or transfers;Assistance with cooking/housework;A little help with bathing/dressing/bathroom;Assist for transportation;Help with stairs or ramp for entrance   Can travel by private vehicle        Equipment Recommendations  Rolling walker (2 wheels)    Recommendations for Other Services       Precautions / Restrictions Precautions Precautions: Fall Recall of Precautions/Restrictions: Intact Restrictions Weight Bearing Restrictions Per Provider Order: No     Mobility  Bed Mobility Overal bed mobility: Modified Independent Bed Mobility: Sit to Supine     Supine to sit: HOB elevated, Used rails, Modified independent (Device/Increase time)          Transfers Overall transfer level: Needs assistance Equipment used: Rolling walker (2 wheels) Transfers: Sit to/from Stand, Bed to chair/wheelchair/BSC Sit to Stand: Supervision   Step pivot transfers: Supervision        General transfer comment: steady and safe technique    Ambulation/Gait Ambulation/Gait assistance: Contact guard assist Gait Distance (Feet): 55 Feet Assistive device: Rolling walker (2 wheels) Gait Pattern/deviations: Decreased step length - right, Decreased step length - left, Decreased stride length, Trunk flexed Gait velocity: decreased     General Gait Details: no chair following   Stairs             Wheelchair Mobility     Tilt Bed    Modified Rankin (Stroke Patients Only)       Balance Overall balance assessment: Needs assistance Sitting-balance support: Feet supported       Standing balance support: During functional activity, Bilateral upper extremity supported, Reliant on assistive device for balance Standing balance-Leahy Scale: Good                              Communication Communication Communication: No apparent difficulties  Cognition Arousal: Alert Behavior During Therapy: WFL for tasks assessed/performed   PT - Cognitive impairments: No apparent impairments                         Following commands: Intact      Cueing Cueing Techniques: Verbal cues  Exercises Total Joint Exercises Long Arc Quad: AROM, Both, 5 reps    General Comments        Pertinent Vitals/Pain Pain Assessment Pain Score: 10-Worst pain ever Pain Location: BLE with mobility Pain Descriptors / Indicators: Discomfort, Aching, Burning Pain Intervention(s): Premedicated before session, Monitored during session    Home Living  Prior Function            PT Goals (current goals can now be found in the care plan section) Acute Rehab PT Goals Patient Stated Goal: decreased pain, get better PT Goal Formulation: With patient Time For Goal Achievement: 07/08/24 Potential to Achieve Goals: Good Progress towards PT goals: Progressing toward goals    Frequency    Min 2X/week      PT Plan       Co-evaluation              AM-PAC PT 6 Clicks Mobility   Outcome Measure  Help needed turning from your back to your side while in a flat bed without using bedrails?: None Help needed moving from lying on your back to sitting on the side of a flat bed without using bedrails?: A Little Help needed moving to and from a bed to a chair (including a wheelchair)?: A Little Help needed standing up from a chair using your arms (e.g., wheelchair or bedside chair)?: A Little Help needed to walk in hospital room?: A Little Help needed climbing 3-5 steps with a railing? : A Little 6 Click Score: 19    End of Session Equipment Utilized During Treatment: Gait belt Activity Tolerance: Patient tolerated treatment well Patient left: in chair;with call bell/phone within reach Nurse Communication: Mobility status PT Visit Diagnosis: Muscle weakness (generalized) (M62.81);Other abnormalities of gait and mobility (R26.89);Pain Pain - Right/Left: Left (L and R) Pain - part of body: Leg     Time: 1135-1157 PT Time Calculation (min) (ACUTE ONLY): 22 min  Charges:    $Therapeutic Activity: 8-22 mins PT General Charges $$ ACUTE PT VISIT: 1 Visit                     Harland Irving, PTA  06/26/24, 12:09 PM

## 2024-06-26 NOTE — Progress Notes (Signed)
 3 Days Post-Op   Subjective/Chief Complaint: Doing ok. States his legs are sore but improving daily. Tolerating OOB with PT.   Objective: Vital signs in last 24 hours: Temp:  [97.5 F (36.4 C)-99.1 F (37.3 C)] 98.5 F (36.9 C) (07/19 0823) Pulse Rate:  [37-87] 80 (07/19 0823) Resp:  [14-25] 18 (07/19 0823) BP: (98-123)/(42-83) 99/62 (07/19 0823) SpO2:  [69 %-100 %] 96 % (07/19 0823) Last BM Date : 06/25/24  Intake/Output from previous day: 07/18 0701 - 07/19 0700 In: 1309.5 [P.O.:600; I.V.:46.5; Blood:663] Out: 1695 [Urine:1695] Intake/Output this shift: No intake/output data recorded.  General appearance: alert and no distress Cardio: regular rate and rhythm Extremities: Groin incisions- C/D/I, soft, legs warm, +DP/PT by doppler  Lab Results:  Recent Labs    06/25/24 0540 06/25/24 1601 06/26/24 0314  WBC 12.9*  --  12.5*  HGB 5.9* 8.1* 7.5*  HCT 18.7* 24.8* 22.9*  PLT 214  --  176   BMET Recent Labs    06/25/24 0540 06/26/24 0314  NA 139 140  K 5.2* 4.6  CL 114* 115*  CO2 17* 18*  GLUCOSE 118* 114*  BUN 58* 66*  CREATININE 2.13* 2.32*  CALCIUM  8.6* 8.5*   PT/INR No results for input(s): LABPROT, INR in the last 72 hours. ABG No results for input(s): PHART, HCO3 in the last 72 hours.  Invalid input(s): PCO2, PO2  Studies/Results: No results found.  Anti-infectives: Anti-infectives (From admission, onward)    Start     Dose/Rate Route Frequency Ordered Stop   06/23/24 1900  vancomycin  (VANCOCIN ) IVPB 1000 mg/200 mL premix        1,000 mg 200 mL/hr over 60 Minutes Intravenous Every 12 hours 06/23/24 1448 06/24/24 2120   06/23/24 0600  vancomycin  (VANCOCIN ) IVPB 1000 mg/200 mL premix        1,000 mg 200 mL/hr over 60 Minutes Intravenous On call to O.R. 06/23/24 0152 06/23/24 0815       Assessment/Plan: POD #3 s/p  Bilateral common femoral and superficial femoral  endarterectomy with bovine pericardial patch  angioplasty. Bilateral profunda femoris endarterectomy with bovine pericardial patch angioplasty (completely separate and distinct second patch used for each profunda femoris repair) Open angioplasty and stent placement of the right common and external iliac arteries. Open angioplasty and stent placement of the left common and external iliac arteries. Continue PT/OOB  Monitor HgB- patient stated he had a history of GIB in the past. If continued decline- may require GI consult. Will likely need Home health  LOS: 3 days    Tisa Dakin A 06/26/2024

## 2024-06-27 LAB — CBC
HCT: 22.2 % — ABNORMAL LOW (ref 39.0–52.0)
Hemoglobin: 7.2 g/dL — ABNORMAL LOW (ref 13.0–17.0)
MCH: 28.8 pg (ref 26.0–34.0)
MCHC: 32.4 g/dL (ref 30.0–36.0)
MCV: 88.8 fL (ref 80.0–100.0)
Platelets: 220 K/uL (ref 150–400)
RBC: 2.5 MIL/uL — ABNORMAL LOW (ref 4.22–5.81)
RDW: 15.4 % (ref 11.5–15.5)
WBC: 12.3 K/uL — ABNORMAL HIGH (ref 4.0–10.5)
nRBC: 0 % (ref 0.0–0.2)

## 2024-06-27 LAB — IRON AND TIBC
Iron: 22 ug/dL — ABNORMAL LOW (ref 45–182)
Saturation Ratios: 12 % — ABNORMAL LOW (ref 17.9–39.5)
TIBC: 178 ug/dL — ABNORMAL LOW (ref 250–450)
UIBC: 156 ug/dL

## 2024-06-27 LAB — FOLATE: Folate: 9.4 ng/mL (ref >5.9)

## 2024-06-27 LAB — VITAMIN B12: Vitamin B-12: 160 pg/mL — ABNORMAL LOW (ref 180–914)

## 2024-06-27 LAB — PREPARE RBC (CROSSMATCH)

## 2024-06-27 LAB — FERRITIN: Ferritin: 241 ng/mL (ref 24–336)

## 2024-06-27 MED ORDER — SODIUM CHLORIDE 0.9% IV SOLUTION: Freq: Once | INTRAVENOUS | Status: AC

## 2024-06-27 MED ORDER — VITAMIN B-12 1000 MCG PO TABS
1000.0000 ug | ORAL_TABLET | Freq: Every day | ORAL | Status: DC
  Administered 2024-06-27 – 2024-07-02 (×6): 1000 ug via ORAL
  Filled 2024-06-27 (×6): qty 1
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
## 2024-06-27 NOTE — Plan of Care (Signed)

## 2024-06-27 NOTE — Progress Notes (Signed)
 4 Days Post-Op   Subjective/Chief Complaint: Tolerated OOB yesterday, ambulated with assist. States legs are improving. States he had a tarry black stool last night/this morning. HgB continues to trend down. Asymptomatic. Known history of GIB with AVMs. States Plavix  caused similar issue in the past.   Objective: Vital signs in last 24 hours: Temp:  [97.4 F (36.3 C)-98.5 F (36.9 C)] 97.7 F (36.5 C) (07/20 0817) Pulse Rate:  [66-89] 89 (07/20 0817) Resp:  [15-18] 16 (07/20 0817) BP: (102-109)/(47-60) 106/58 (07/20 0817) SpO2:  [97 %-100 %] 100 % (07/20 0817) Last BM Date : 06/23/24  Intake/Output from previous day: 07/19 0701 - 07/20 0700 In: 360 [P.O.:360] Out: 1450 [Urine:1450] Intake/Output this shift: No intake/output data recorded.  General appearance: alert and no distress Cardio: regular rate and rhythm GI: soft, non-tender; bowel sounds normal; no masses,  no organomegaly Extremities: groin incisions- soft, incisions- C/D/I, feet warm, +DP/PT by doppler  Lab Results:  Recent Labs    06/26/24 0314 06/27/24 0547  WBC 12.5* 12.3*  HGB 7.5* 7.2*  HCT 22.9* 22.2*  PLT 176 220   BMET Recent Labs    06/25/24 0540 06/26/24 0314  NA 139 140  K 5.2* 4.6  CL 114* 115*  CO2 17* 18*  GLUCOSE 118* 114*  BUN 58* 66*  CREATININE 2.13* 2.32*  CALCIUM  8.6* 8.5*   PT/INR No results for input(s): LABPROT, INR in the last 72 hours. ABG No results for input(s): PHART, HCO3 in the last 72 hours.  Invalid input(s): PCO2, PO2  Studies/Results: No results found.  Anti-infectives: Anti-infectives (From admission, onward)    Start     Dose/Rate Route Frequency Ordered Stop   06/23/24 1900  vancomycin  (VANCOCIN ) IVPB 1000 mg/200 mL premix        1,000 mg 200 mL/hr over 60 Minutes Intravenous Every 12 hours 06/23/24 1448 06/24/24 2120   06/23/24 0600  vancomycin  (VANCOCIN ) IVPB 1000 mg/200 mL premix        1,000 mg 200 mL/hr over 60 Minutes  Intravenous On call to O.R. 06/23/24 0152 06/23/24 0815       Assessment/Plan: POD #4 s/p Bilateral femoral endarterectomy; Iliac stenting Trending down of HgB with tarry stool- history of GIB- AVMs Will hold Plavix /Lovenox - continue ASA Transfuse 1U pRBC Consult GI to evaluate   LOS: 4 days    Tisa Dakin A 06/27/2024

## 2024-06-28 ENCOUNTER — Inpatient Hospital Stay

## 2024-06-28 ENCOUNTER — Inpatient Hospital Stay: Attending: Hematology | Admitting: Oncology

## 2024-06-28 DIAGNOSIS — D649 Anemia, unspecified: Secondary | ICD-10-CM | POA: Diagnosis not present

## 2024-06-28 DIAGNOSIS — Z860101 Personal history of adenomatous and serrated colon polyps: Secondary | ICD-10-CM | POA: Diagnosis not present

## 2024-06-28 DIAGNOSIS — K921 Melena: Secondary | ICD-10-CM | POA: Diagnosis not present

## 2024-06-28 LAB — COMPREHENSIVE METABOLIC PANEL WITH GFR
ALT: 6 U/L (ref 0–44)
AST: 10 U/L — ABNORMAL LOW (ref 15–41)
Albumin: 2.5 g/dL — ABNORMAL LOW (ref 3.5–5.0)
Alkaline Phosphatase: 49 U/L (ref 38–126)
Anion gap: 8 (ref 5–15)
BUN: 62 mg/dL — ABNORMAL HIGH (ref 8–23)
CO2: 18 mmol/L — ABNORMAL LOW (ref 22–32)
Calcium: 8.9 mg/dL (ref 8.9–10.3)
Chloride: 113 mmol/L — ABNORMAL HIGH (ref 98–111)
Creatinine, Ser: 2.04 mg/dL — ABNORMAL HIGH (ref 0.61–1.24)
GFR, Estimated: 33 mL/min — ABNORMAL LOW (ref >60)
Glucose, Bld: 110 mg/dL — ABNORMAL HIGH (ref 70–99)
Potassium: 5.5 mmol/L — ABNORMAL HIGH (ref 3.5–5.1)
Sodium: 139 mmol/L (ref 135–145)
Total Bilirubin: 0.6 mg/dL (ref 0.0–1.2)
Total Protein: 5.5 g/dL — ABNORMAL LOW (ref 6.5–8.1)

## 2024-06-28 LAB — CBC
HCT: 24.4 % — ABNORMAL LOW (ref 39.0–52.0)
Hemoglobin: 8 g/dL — ABNORMAL LOW (ref 13.0–17.0)
MCH: 29.3 pg (ref 26.0–34.0)
MCHC: 32.8 g/dL (ref 30.0–36.0)
MCV: 89.4 fL (ref 80.0–100.0)
Platelets: 242 K/uL (ref 150–400)
RBC: 2.73 MIL/uL — ABNORMAL LOW (ref 4.22–5.81)
RDW: 15.5 % (ref 11.5–15.5)
WBC: 11.1 K/uL — ABNORMAL HIGH (ref 4.0–10.5)
nRBC: 0 % (ref 0.0–0.2)

## 2024-06-28 LAB — TYPE AND SCREEN
ABO/RH(D): A POS
Antibody Screen: NEGATIVE
Unit division: 0
Unit division: 0
Unit division: 0

## 2024-06-28 LAB — MAGNESIUM: Magnesium: 1.9 mg/dL (ref 1.7–2.4)

## 2024-06-28 LAB — BPAM RBC
Blood Product Expiration Date: 202507202359
Blood Product Expiration Date: 202507232359
Blood Product Expiration Date: 202508172359
ISSUE DATE / TIME: 202507180941
ISSUE DATE / TIME: 202507181230
ISSUE DATE / TIME: 202507201233
Unit Type and Rh: 6200
Unit Type and Rh: 6200
Unit Type and Rh: 6200

## 2024-06-28 LAB — GLUCOSE, CAPILLARY: Glucose-Capillary: 128 mg/dL — ABNORMAL HIGH (ref 70–99)

## 2024-06-28 MED ORDER — PROPOFOL 10 MG/ML IV BOLUS
INTRAVENOUS | Status: AC
  Filled 2024-06-28: qty 20

## 2024-06-28 MED ORDER — SODIUM ZIRCONIUM CYCLOSILICATE 10 G PO PACK
10.0000 g | PACK | Freq: Once | ORAL | Status: AC
  Administered 2024-06-28: 10 g via ORAL
  Filled 2024-06-28: qty 1

## 2024-06-28 NOTE — Plan of Care (Signed)

## 2024-06-28 NOTE — Progress Notes (Signed)
 Occupational Therapy Treatment Patient Details Name: HARSHAAN WHANG MRN: [id number] DOB: 1946/11/27 Today's Date: 06/28/2024   History of present illness Pt is a 78 year old male s/p bilateral femoral endarterectomy on 06/23/24  PMH significant for CAD, diastolic dysfunction, PAD with intermittent claudication, aortic murmur, aortic atherosclerosis, HTN, HLD, secondary hyperparathyroidism of renal origin, CKD-IV, COPD, GERD (no daily Tx), cirrhosis, anemia of chronic renal disease, nephrolithiasis, OA, chronic opioid therapy   OT comments  Pt seen for OT tx. Pt in bed, endorsing 7/10 L>R groin pain but agreeable to session. Pt mod indep with bed mobility. Stands with supv. Pt edu in ECS, pain mgt strategies, and AE for LB ADL tasks to improve indep and comfort while ambulating ~55' with RW requiring supv. Pt verbalized understanding, requesting to return to bed, and verbalizing plan to get up with staff at least 2 more times today to walk further. Pt progressing well towards goals.       If plan is discharge home, recommend the following:  A little help with walking and/or transfers;A little help with bathing/dressing/bathroom;Help with stairs or ramp for entrance;Assistance with cooking/housework;Assist for transportation   Equipment Recommendations  BSC/3in1;Tub/shower seat    Recommendations for Other Services      Precautions / Restrictions Precautions Precautions: Fall Recall of Precautions/Restrictions: Intact Restrictions Weight Bearing Restrictions Per Provider Order: No       Mobility Bed Mobility Overal bed mobility: Modified Independent Bed Mobility: Supine to Sit, Sit to Supine     Supine to sit: HOB elevated, Used rails, Modified independent (Device/Increase time) Sit to supine: Modified independent (Device/Increase time)   General bed mobility comments: increase effort 2/2 groin pain    Transfers Overall transfer level: Needs assistance Equipment used: Rolling  walker (2 wheels) Transfers: Sit to/from Stand Sit to Stand: Supervision                 Balance Overall balance assessment: Needs assistance Sitting-balance support: Feet supported Sitting balance-Leahy Scale: Good     Standing balance support: During functional activity, Bilateral upper extremity supported, Reliant on assistive device for balance Standing balance-Leahy Scale: Good                             ADL either performed or assessed with clinical judgement   ADL Overall ADL's : Needs assistance/impaired                                       General ADL Comments: Continues to require assist for LB ADL tasks 2/2 groin pain with bending/hip flexion    Extremity/Trunk Assessment              Vision       Perception     Praxis     Communication     Cognition Arousal: Alert Behavior During Therapy: WFL for tasks assessed/performed Cognition: No apparent impairments                               Following commands: Intact        Cueing   Cueing Techniques: Verbal cues  Exercises Other Exercises Other Exercises: Pt edu in ECS, pain mgt strategies, and AE for LB ADL tasks to improve indep and comfort while ambulating ~55' with RW requiring supv  Shoulder Instructions       General Comments VSS    Pertinent Vitals/ Pain       Pain Assessment Pain Assessment: 0-10 Pain Score: 7  Pain Location: L>R groin pain with mobility up to 10, at rest before and after session 7/10 Pain Descriptors / Indicators: Discomfort, Aching, Burning Pain Intervention(s): Limited activity within patient's tolerance, Monitored during session, Premedicated before session, Repositioned  Home Living                                          Prior Functioning/Environment              Frequency  Min 3X/week        Progress Toward Goals  OT Goals(current goals can now be found in the care plan  section)  Progress towards OT goals: Progressing toward goals  Acute Rehab OT Goals Patient Stated Goal: return to his garden OT Goal Formulation: With patient Time For Goal Achievement: 07/08/24 Potential to Achieve Goals: Good  Plan      Co-evaluation                 AM-PAC OT 6 Clicks Daily Activity     Outcome Measure   Help from another person eating meals?: None Help from another person taking care of personal grooming?: None Help from another person toileting, which includes using toliet, bedpan, or urinal?: A Little Help from another person bathing (including washing, rinsing, drying)?: A Lot Help from another person to put on and taking off regular upper body clothing?: None Help from another person to put on and taking off regular lower body clothing?: A Lot 6 Click Score: 19    End of Session Equipment Utilized During Treatment: Rolling walker (2 wheels)  OT Visit Diagnosis: Other abnormalities of gait and mobility (R26.89);Muscle weakness (generalized) (M62.81)   Activity Tolerance Patient tolerated treatment well   Patient Left in bed;with call bell/phone within reach;with bed alarm set   Nurse Communication          Time: 9146-9092 OT Time Calculation (min): 14 min  Charges: OT General Charges $OT Visit: 1 Visit OT Treatments $Self Care/Home Management : 8-22 mins  Warren SAUNDERS., MPH, MS, OTR/L ascom [phone] 06/28/24, 9:21 AM

## 2024-06-28 NOTE — Progress Notes (Signed)
 Progress Note    06/28/2024 11:51 AM 5 Days Post-Op  Subjective:  Jeffery Ponce is a 78 yo male now POD #2 from:   PROCEDURE: Bilateral common femoral and superficial femoral  endarterectomy with bovine pericardial patch angioplasty. Bilateral profunda femoris endarterectomy with bovine pericardial patch angioplasty (completely separate and distinct second patch used for each profunda femoris repair) Open angioplasty and stent placement of the right common and external iliac arteries. Open angioplasty and stent placement of the left common and external iliac arteries.   Patient is resting comfortably in bed on the floor this morning.  He does endorse some soreness to his left groin site and right groin.  Patient has been OOB. He does endorse that his legs feel better overall and his feet feel well.  No noted black tarry stools to report this am or from overnight. No other complaints overnight vitals are remained stable.   Vitals:   06/28/24 0315 06/28/24 0728  BP: 115/63 (!) 120/53  Pulse: 73 67  Resp: 20 16  Temp: 98.1 F (36.7 C) 98 F (36.7 C)  SpO2: 99% 98%   Physical Exam: Cardiac:  RRR, Normal S1, S2 with Murmur Lungs:  Clear throughout on auscultation, no rales rhonchi or wheezing Incisions: Bilateral groin incisions with staples intact.  Dressings clean dry and intact.  Slight seroma noted under the skin to the left groin area. Extremities: Bilateral lower extremities warm to touch this morning.  Strong Doppler pulses DP and PT bilaterally Abdomen: Positive bowel sounds throughout, soft, nontender and nondistended Neurologic: Alert and oriented x 3, answers all questions and follows commands appropriately.  CBC    Component Value Date/Time   WBC 11.1 (H) 06/28/2024 0514   RBC 2.73 (L) 06/28/2024 0514   HGB 8.0 (L) 06/28/2024 0514   HCT 24.4 (L) 06/28/2024 0514   PLT 242 06/28/2024 0514   MCV 89.4 06/28/2024 0514   MCH 29.3 06/28/2024 0514   MCHC 32.8 06/28/2024  0514   RDW 15.5 06/28/2024 0514   LYMPHSABS 1.4 06/10/2024 1050   MONOABS 0.5 06/10/2024 1050   EOSABS 0.2 06/10/2024 1050   BASOSABS 0.0 06/10/2024 1050    BMET    Component Value Date/Time   NA 139 06/28/2024 0514   NA 139 12/19/2021 0918   K 5.5 (H) 06/28/2024 0514   CL 113 (H) 06/28/2024 0514   CO2 18 (L) 06/28/2024 0514   GLUCOSE 110 (H) 06/28/2024 0514   BUN 62 (H) 06/28/2024 0514   BUN 21 12/19/2021 0918   CREATININE 2.04 (H) 06/28/2024 0514   CALCIUM  8.9 06/28/2024 0514   GFRNONAA 33 (L) 06/28/2024 0514   GFRAA 37 (L) 06/20/2020 1008    INR    Component Value Date/Time   INR 1.0 03/16/2024 1148     Intake/Output Summary (Last 24 hours) at 06/28/2024 1151 Last data filed at 06/28/2024 0900 Gross per 24 hour  Intake 675 ml  Output 1000 ml  Net -325 ml     Assessment/Plan:  78 y.o. male is s/p See Above 5 Days Post-Op   PLAN Patient is recovering as expected and doing well from a vascular standpoint.  However patient noted to have dark tarry stools over the weekend.  GI was consulted to see him sometime today for possible EGD or colonoscopy or both. Patient was taken off Plavix  75 mg daily as patient endorsed it gave him the same symptoms last time.  Patient to remain on aspirin  81 mg daily and Lovenox  30 mg  subcu every 24 was held today.  DVT prophylaxis: ASA 81 mg daily   Gwendlyn JONELLE [facility]ank [facility] 06/28/2024 11:51 AM

## 2024-06-28 NOTE — Plan of Care (Signed)
  Problem: Education: Goal: Knowledge of General Education information will improve Description: Including pain rating scale, medication(s)/side effects and non-pharmacologic comfort measures Outcome: Progressing   Problem: Skin Integrity: Goal: Risk for impaired skin integrity will decrease Outcome: Progressing   Problem: Safety: Goal: Ability to remain free from injury will improve Outcome: Progressing

## 2024-06-28 NOTE — Progress Notes (Signed)
 Physical Therapy Treatment Patient Details Name: Jeffery Ponce MRN: [id number] DOB: 08-24-1946 Today's Date: 06/28/2024   History of Present Illness Pt is a 78 year old male s/p bilateral femoral endarterectomy on 06/23/24  PMH significant for CAD, diastolic dysfunction, PAD with intermittent claudication, aortic murmur, aortic atherosclerosis, HTN, HLD, secondary hyperparathyroidism of renal origin, CKD-IV, COPD, GERD (no daily Tx), cirrhosis, anemia of chronic renal disease, nephrolithiasis, OA, chronic opioid therapy    PT Comments  Completes bed mobility, transfers and gait with RW, cga/close sup.  Steady cadence with no buckling or LOB; fairly comfortable with mobility performance. Encouraged stair trial, but patient declined stating I know I'll be able to get in the house.   Will continue to progress as able, progressing gait distance and initiating stairs as appropriate.    If plan is discharge home, recommend the following: A little help with walking and/or transfers;Assistance with cooking/housework;A little help with bathing/dressing/bathroom;Assist for transportation;Help with stairs or ramp for entrance   Can travel by private vehicle        Equipment Recommendations  Rolling walker (2 wheels)    Recommendations for Other Services       Precautions / Restrictions Precautions Precautions: Fall Restrictions Weight Bearing Restrictions Per Provider Order: No     Mobility  Bed Mobility Overal bed mobility: Modified Independent                  Transfers Overall transfer level: Needs assistance Equipment used: Rolling walker (2 wheels) Transfers: Sit to/from Stand Sit to Stand: Supervision                Ambulation/Gait Ambulation/Gait assistance: Supervision Gait Distance (Feet): 110 Feet Assistive device: Rolling walker (2 wheels)         General Gait Details: reciprocal stepping pattern with fair step height/length, decreased cadence but no  overt buckling or LOB   Stairs Stairs:  (refused stairs)           Wheelchair Mobility     Tilt Bed    Modified Rankin (Stroke Patients Only)       Balance Overall balance assessment: Needs assistance Sitting-balance support: No upper extremity supported, Feet supported Sitting balance-Leahy Scale: Good     Standing balance support: Bilateral upper extremity supported Standing balance-Leahy Scale: Fair                              Communication    Cognition Arousal: Alert Behavior During Therapy: WFL for tasks assessed/performed   PT - Cognitive impairments: No apparent impairments                         Following commands: Intact      Cueing Cueing Techniques: Verbal cues  Exercises      General Comments        Pertinent Vitals/Pain Pain Assessment Pain Assessment: 0-10 Pain Score: 9  Pain Location: bilat groin pain Pain Descriptors / Indicators: Discomfort, Aching, Burning Pain Intervention(s): Limited activity within patient's tolerance, Monitored during session, Repositioned, Premedicated before session    Home Living                          Prior Function            PT Goals (current goals can now be found in the care plan section) Acute Rehab PT Goals Patient Stated Goal:  decreased pain, get better PT Goal Formulation: With patient Time For Goal Achievement: 07/08/24 Potential to Achieve Goals: Good Progress towards PT goals: Progressing toward goals    Frequency    Min 2X/week      PT Plan      Co-evaluation              AM-PAC PT 6 Clicks Mobility   Outcome Measure  Help needed turning from your back to your side while in a flat bed without using bedrails?: None Help needed moving from lying on your back to sitting on the side of a flat bed without using bedrails?: None Help needed moving to and from a bed to a chair (including a wheelchair)?: None Help needed standing up from  a chair using your arms (e.g., wheelchair or bedside chair)?: None Help needed to walk in hospital room?: A Little Help needed climbing 3-5 steps with a railing? : A Little 6 Click Score: 22    End of Session   Activity Tolerance: Patient tolerated treatment well Patient left: in bed;with bed alarm set;with call bell/phone within reach   PT Visit Diagnosis: Muscle weakness (generalized) (M62.81);Other abnormalities of gait and mobility (R26.89);Pain     Time: 1130-1144 PT Time Calculation (min) (ACUTE ONLY): 14 min  Charges:    $Gait Training: 8-22 mins PT General Charges $$ ACUTE PT VISIT: 1 Visit                     Tosha Belgarde H. Delores, PT, DPT, NCS 06/28/24, 2:58 PM [phone]

## 2024-06-28 NOTE — Progress Notes (Signed)
 Rogelia Copping, MD [facility]   [address] [place], [place] [address] Phone: [phone] Fax : [phone]   Subjective: This patient was seen by Dr. Unk yesterday for anemia with melena.  The patient has a history of bladder cancer and iron  deficiency anemia with chronic liver disease.  The patient was on Plavix  that was stopped yesterday when he was found to have black tarry stools.  The patient was given a unit of hemoglobin after they noticed his hemoglobin going down with the black stools.  The patient was on Plavix  for a femoral endarterectomy. The patient had a history of AVMs and tubular adenomas found during procedures in [facility] by gastroenterology there.   Objective: Vital signs in last 24 hours: Vitals:   06/27/24 1253 06/27/24 1553 06/27/24 2041 06/28/24 0315  BP: (!) 94/46 (!) 113/51 (!) 111/52 115/63  Pulse: 66 70 67 73  Resp: 16  18 20   Temp: 98.3 F (36.8 C) 98.2 F (36.8 C) 97.7 F (36.5 C) 98.1 F (36.7 C)  TempSrc: Oral Oral Oral Oral  SpO2: 99% 98% 99% 99%  Weight:      Height:       Weight change:   Intake/Output Summary (Last 24 hours) at 06/28/2024 9341 Last data filed at 06/27/2024 1900 Gross per 24 hour  Intake 315 ml  Output 250 ml  Net 65 ml     Exam: Heart:: Regular rate and rhythm or without murmur or extra heart sounds Lungs: normal and clear to auscultation and percussion Abdomen: soft, nontender, normal bowel sounds   Lab Results: @LABTEST2 @ Micro Results: Recent Results (from the past 240 hours)  MRSA Next Gen by PCR, Nasal     Status: None   Collection Time: 06/23/24  3:20 PM   Specimen: Nasal Mucosa; Nasal Swab  Result Value Ref Range Status   MRSA by PCR Next Gen NOT DETECTED NOT DETECTED Final    Comment: (NOTE) The GeneXpert MRSA Assay (FDA approved for NASAL specimens only), is one component of a comprehensive MRSA colonization surveillance program. It is not intended to diagnose MRSA infection nor to  guide or monitor treatment for MRSA infections. Test performance is not FDA approved in patients less than 80 years old. Performed at [facility], [address] [place], [place] [address]    Studies/Results: No results found. Medications: I have reviewed the patient's current medications. Scheduled Meds:  amLODipine   5 mg Oral Daily   aspirin  EC  81 mg Oral Daily   atorvastatin   10 mg Oral QODAY   Chlorhexidine  Gluconate Cloth  6 each Topical Daily   cholecalciferol   1,000 Units Oral Daily   vitamin B-12  1,000 mcg Oral Daily   dapagliflozin  propanediol  5 mg Oral Daily   docusate sodium   100 mg Oral Daily   enoxaparin  (LOVENOX ) injection  30 mg Subcutaneous Q24H   lisinopril   2.5 mg Oral Daily   morphine   15 mg Oral BID   polyethylene glycol  17 g Oral BID   psyllium  1 packet Oral BID   sodium bicarbonate   650 mg Oral BID   Continuous Infusions:  nitroGLYCERIN  Stopped (06/23/24 1453)   PRN Meds:.acetaminophen , albuterol , hydrALAZINE , labetalol , metoprolol  tartrate, morphine  injection, ondansetron , ondansetron  (ZOFRAN ) IV, phenol, potassium chloride , senna-docusate, sorbitol    Assessment: Principal Problem:   Atherosclerosis of artery of extremity with ulceration (HCC)    Plan: The patient was given a unit of blood with a hemoglobin of 7.2 yesterday which is  8.0 today.  The patient reports that his last black stool was yesterday.  The patient received Plavix  yesterday and has been put on Lovenox  since the Plavix  has been stopped.  Since the patient does not appear to have any further GI bleeding due to no further black stools I will wait for the Plavix  to washout and hopefully will do an upper endoscopy on him later this week and possibly a colonoscopy since his last colonoscopy was in 2019 with a history of tubular adenomas.   LOS: 5 days   Rogelia Copping, MD.FACG 06/28/2024, 6:58 AM Pager [phone] 7am-5pm  Check AMION for 5pm -7am coverage and on  weekends

## 2024-06-29 LAB — BASIC METABOLIC PANEL WITH GFR
Anion gap: 7 (ref 5–15)
BUN: 70 mg/dL — ABNORMAL HIGH (ref 8–23)
CO2: 19 mmol/L — ABNORMAL LOW (ref 22–32)
Calcium: 8.7 mg/dL — ABNORMAL LOW (ref 8.9–10.3)
Chloride: 113 mmol/L — ABNORMAL HIGH (ref 98–111)
Creatinine, Ser: 2.3 mg/dL — ABNORMAL HIGH (ref 0.61–1.24)
GFR, Estimated: 28 mL/min — ABNORMAL LOW (ref >60)
Glucose, Bld: 104 mg/dL — ABNORMAL HIGH (ref 70–99)
Potassium: 5.3 mmol/L — ABNORMAL HIGH (ref 3.5–5.1)
Sodium: 139 mmol/L (ref 135–145)

## 2024-06-29 MED ORDER — SODIUM ZIRCONIUM CYCLOSILICATE 10 G PO PACK
10.0000 g | PACK | Freq: Once | ORAL | Status: AC
  Administered 2024-06-29: 10 g via ORAL
  Filled 2024-06-29: qty 1

## 2024-06-29 NOTE — Plan of Care (Signed)

## 2024-06-29 NOTE — Progress Notes (Signed)
 Physical Therapy Treatment Patient Details Name: Jeffery Ponce MRN: [id number] DOB: 1946-04-08 Today's Date: 06/29/2024   History of Present Illness Pt is a 78 year old male s/p bilateral femoral endarterectomy on 06/23/24  PMH significant for CAD, diastolic dysfunction, PAD with intermittent claudication, aortic murmur, aortic atherosclerosis, HTN, HLD, secondary hyperparathyroidism of renal origin, CKD-IV, COPD, GERD (no daily Tx), cirrhosis, anemia of chronic renal disease, nephrolithiasis, OA, chronic opioid therapy    PT Comments  Patient continues to progress towards physical therapy goals. Patient completes bed mobility modI and stood from low bed surface with supervision. Complains of 8/10 L groin pain throughout but still motivated to participate. Ambulated 150' with RW and supervision. Discharge plan remains appropriate.     If plan is discharge home, recommend the following: A little help with walking and/or transfers;Assistance with cooking/housework;A little help with bathing/dressing/bathroom;Assist for transportation;Help with stairs or ramp for entrance   Can travel by private vehicle        Equipment Recommendations  Rolling Dora Simeone (2 wheels)    Recommendations for Other Services       Precautions / Restrictions Precautions Precautions: Fall Recall of Precautions/Restrictions: Intact Restrictions Weight Bearing Restrictions Per Provider Order: No     Mobility  Bed Mobility Overal bed mobility: Modified Independent                  Transfers Overall transfer level: Needs assistance Equipment used: Rolling Isack Lavalley (2 wheels) Transfers: Sit to/from Stand Sit to Stand: Supervision                Ambulation/Gait Ambulation/Gait assistance: Supervision Gait Distance (Feet): 150 Feet Assistive device: Rolling Galit Urich (2 wheels) Gait Pattern/deviations: Step-through pattern, Decreased stride length Gait velocity: decreased     General Gait Details:  no LOB noted   Stairs             Wheelchair Mobility     Tilt Bed    Modified Rankin (Stroke Patients Only)       Balance Overall balance assessment: Needs assistance Sitting-balance support: No upper extremity supported, Feet supported Sitting balance-Leahy Scale: Good     Standing balance support: Bilateral upper extremity supported Standing balance-Leahy Scale: Fair                              Hotel manager: No apparent difficulties  Cognition Arousal: Alert Behavior During Therapy: WFL for tasks assessed/performed   PT - Cognitive impairments: No apparent impairments                         Following commands: Intact      Cueing    Exercises      General Comments        Pertinent Vitals/Pain Pain Assessment Pain Assessment: 0-10 Pain Score: 8  Pain Location: L groin Pain Descriptors / Indicators: Discomfort, Aching, Burning Pain Intervention(s): Limited activity within patient's tolerance, Monitored during session, Repositioned    Home Living                          Prior Function            PT Goals (current goals can now be found in the care plan section) Acute Rehab PT Goals Patient Stated Goal: decreased pain, get better PT Goal Formulation: With patient Time For Goal Achievement: 07/08/24 Potential to Achieve Goals: Good  Progress towards PT goals: Progressing toward goals    Frequency    Min 2X/week      PT Plan      Co-evaluation              AM-PAC PT 6 Clicks Mobility   Outcome Measure  Help needed turning from your back to your side while in a flat bed without using bedrails?: None Help needed moving from lying on your back to sitting on the side of a flat bed without using bedrails?: None Help needed moving to and from a bed to a chair (including a wheelchair)?: None Help needed standing up from a chair using your arms (e.g., wheelchair or  bedside chair)?: None Help needed to walk in hospital room?: A Little Help needed climbing 3-5 steps with a railing? : A Little 6 Click Score: 22    End of Session   Activity Tolerance: Patient tolerated treatment well Patient left: in bed;with bed alarm set;with call bell/phone within reach   PT Visit Diagnosis: Muscle weakness (generalized) (M62.81);Other abnormalities of gait and mobility (R26.89);Pain     Time: 8953-8941 PT Time Calculation (min) (ACUTE ONLY): 12 min  Charges:    $Therapeutic Activity: 8-22 mins PT General Charges $$ ACUTE PT VISIT: 1 Visit                     Maryanne Finder, PT, DPT Physical Therapist - [facility]  [facility]    Lilyian Quayle A Shakisha Abend 06/29/2024, 11:06 AM

## 2024-06-30 ENCOUNTER — Encounter
Admission: RE | Disposition: A | Discharge status: Home / Self Care | Payer: Self-pay | Source: Home / Self Care | Attending: Vascular Surgery

## 2024-06-30 ENCOUNTER — Inpatient Hospital Stay: Admitting: Anesthesiology

## 2024-06-30 ENCOUNTER — Encounter: Payer: Self-pay | Admitting: Vascular Surgery

## 2024-06-30 DIAGNOSIS — K921 Melena: Secondary | ICD-10-CM

## 2024-06-30 DIAGNOSIS — K31819 Angiodysplasia of stomach and duodenum without bleeding: Secondary | ICD-10-CM

## 2024-06-30 DIAGNOSIS — K31811 Angiodysplasia of stomach and duodenum with bleeding: Secondary | ICD-10-CM

## 2024-06-30 DIAGNOSIS — K920 Hematemesis: Secondary | ICD-10-CM

## 2024-06-30 HISTORY — PX: HOT HEMOSTASIS: SHX5433

## 2024-06-30 HISTORY — PX: ESOPHAGOGASTRODUODENOSCOPY: SHX5428

## 2024-06-30 LAB — CBC
HCT: 22.8 % — ABNORMAL LOW (ref 39.0–52.0)
Hemoglobin: 7.2 g/dL — ABNORMAL LOW (ref 13.0–17.0)
MCH: 28.6 pg (ref 26.0–34.0)
MCHC: 31.6 g/dL (ref 30.0–36.0)
MCV: 90.5 fL (ref 80.0–100.0)
Platelets: 289 K/uL (ref 150–400)
RBC: 2.52 MIL/uL — ABNORMAL LOW (ref 4.22–5.81)
RDW: 15 % (ref 11.5–15.5)
WBC: 11.2 K/uL — ABNORMAL HIGH (ref 4.0–10.5)
nRBC: 0 % (ref 0.0–0.2)

## 2024-06-30 LAB — BASIC METABOLIC PANEL WITH GFR
Anion gap: 9 (ref 5–15)
BUN: 71 mg/dL — ABNORMAL HIGH (ref 8–23)
CO2: 20 mmol/L — ABNORMAL LOW (ref 22–32)
Calcium: 8.5 mg/dL — ABNORMAL LOW (ref 8.9–10.3)
Chloride: 112 mmol/L — ABNORMAL HIGH (ref 98–111)
Creatinine, Ser: 2.37 mg/dL — ABNORMAL HIGH (ref 0.61–1.24)
GFR, Estimated: 27 mL/min — ABNORMAL LOW (ref >60)
Glucose, Bld: 113 mg/dL — ABNORMAL HIGH (ref 70–99)
Potassium: 5.7 mmol/L — ABNORMAL HIGH (ref 3.5–5.1)
Sodium: 141 mmol/L (ref 135–145)

## 2024-06-30 LAB — POTASSIUM: Potassium: 5.6 mmol/L — ABNORMAL HIGH (ref 3.5–5.1)

## 2024-06-30 SURGERY — EGD (ESOPHAGOGASTRODUODENOSCOPY)
Anesthesia: General

## 2024-06-30 MED ORDER — SODIUM CHLORIDE 0.9 % IV SOLN: INTRAVENOUS | Status: AC

## 2024-06-30 MED ORDER — EPHEDRINE SULFATE-NACL 50-0.9 MG/10ML-% IV SOSY
PREFILLED_SYRINGE | INTRAVENOUS | Status: AC | PRN
  Administered 2024-06-30: 15 mg via INTRAVENOUS
  Administered 2024-06-30: 10 mg via INTRAVENOUS

## 2024-06-30 MED ORDER — SODIUM CHLORIDE 0.9 % IV SOLN: INTRAVENOUS | Status: DC

## 2024-06-30 MED ORDER — PROPOFOL 10 MG/ML IV BOLUS
INTRAVENOUS | Status: AC | PRN
Start: 2024-06-30 — End: ?
  Administered 2024-06-30: 30 mg via INTRAVENOUS
  Administered 2024-06-30: 20 mg via INTRAVENOUS
  Administered 2024-06-30: 30 mg via INTRAVENOUS
  Administered 2024-06-30 (×3): 20 mg via INTRAVENOUS

## 2024-06-30 MED ORDER — SODIUM ZIRCONIUM CYCLOSILICATE 10 G PO PACK: 10.0000 g | PACK | Freq: Every day | ORAL | Status: DC

## 2024-06-30 MED ORDER — LIDOCAINE HCL (CARDIAC) PF 100 MG/5ML IV SOSY
PREFILLED_SYRINGE | INTRAVENOUS | Status: AC | PRN
  Administered 2024-06-30: 60 mg via INTRAVENOUS

## 2024-06-30 MED ORDER — SODIUM ZIRCONIUM CYCLOSILICATE 10 G PO PACK
10.0000 g | PACK | Freq: Once | ORAL | Status: AC
  Administered 2024-06-30: 10 g via ORAL
  Filled 2024-06-30: qty 1

## 2024-06-30 NOTE — Op Note (Signed)
 [facility] Gastroenterology Patient Name: Jeffery Ponce Procedure Date: 06/30/2024 10:48 AM MRN: [id number] Account #: [id number] Date of Birth: August 31, 1946 Admit Type: Outpatient Age: 78 Room: [facility] ENDO ROOM 3 Gender: Male Note Status: Finalized Instrument Name: Upper Endoscope [id number] Procedure:             Upper GI endoscopy Indications:           Melena Providers:             Rogelia Copping MD, MD Referring MD:          Selinda CANDIE Gu, MD (Referring MD) Medicines:             Propofol  per Anesthesia Complications:         No immediate complications. Procedure:             Pre-Anesthesia Assessment:                        - Prior to the procedure, a History and Physical was                         performed, and patient medications and allergies were                         reviewed. The patient's tolerance of previous                         anesthesia was also reviewed. The risks and benefits                         of the procedure and the sedation options and risks                         were discussed with the patient. All questions were                         answered, and informed consent was obtained. Prior                         Anticoagulants: The patient has taken Plavix                          (clopidogrel ), last dose was 10 days prior to                         procedure. ASA Grade Assessment: II - A patient with                         mild systemic disease. After reviewing the risks and                         benefits, the patient was deemed in satisfactory                         condition to undergo the procedure.                        After obtaining informed consent, the endoscope was  passed under direct vision. Throughout the procedure,                         the patient's blood pressure, pulse, and oxygen                         saturations were monitored continuously. The                         Endosonoscope was  introduced through the mouth, and                         advanced to the second part of duodenum. The upper GI                         endoscopy was accomplished without difficulty. The                         patient tolerated the procedure well. Findings:      The examined esophagus was normal.      Two small angiodysplastic lesions with bleeding were found in the       gastric fundus. Coagulation for hemostasis using argon plasma at 2       liters/minute and 20 watts was successful.      Hematin (altered blood/coffee-ground-like material) was found in the       entire examined stomach.      The examined duodenum was normal. Impression:            - Normal esophagus.                        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                         Treated with argon plasma coagulation (APC).                        - Hematin (altered blood/coffee-ground-like material)                         in the entire stomach.                        - Normal examined duodenum.                        - No specimens collected. Recommendation:        - Return patient to hospital ward for ongoing care.                        - Soft diet.                        - Continue present medications. Procedure Code(s):     --- Professional ---                        [phone], Esophagogastroduodenoscopy, flexible,                         transoral; with control of bleeding, any method  Diagnosis Code(s):     --- Professional ---                        K92.1, Melena (includes Hematochezia)                        K31.811, Angiodysplasia of stomach and duodenum with                         bleeding CPT copyright 2022 [employer] Association. All rights reserved. The codes documented in this report are preliminary and upon coder review may  be revised to meet current compliance requirements. Rogelia Copping MD, MD 06/30/2024 1:42:07 PM This report has been signed electronically. Number of  Addenda: 0 Note Initiated On: 06/30/2024 10:48 AM Estimated Blood Loss:  Estimated blood loss: none.      [facility]

## 2024-06-30 NOTE — Plan of Care (Signed)

## 2024-06-30 NOTE — Progress Notes (Signed)
 Occupational Therapy Treatment Patient Details Name: Jeffery Ponce MRN: [id number] DOB: 20-Nov-1946 Today's Date: 06/30/2024   History of present illness Pt is a 78 year old male s/p bilateral femoral endarterectomy on 06/23/24  PMH significant for CAD, diastolic dysfunction, PAD with intermittent claudication, aortic murmur, aortic atherosclerosis, HTN, HLD, secondary hyperparathyroidism of renal origin, CKD-IV, COPD, GERD (no daily Tx), cirrhosis, anemia of chronic renal disease, nephrolithiasis, OA, chronic opioid therapy   OT comments  Pt reports feeling weak this date, having been NPO since midnight and having EGD this afternoon, but he is nevertheless eager to engage in OOB mobility. He completes bed mobility with Mod I, Mod A for LB dressing, SUPV for transfers with some verbal cueing for awareness of safety issues. He ambulates ~ 150' in hallway, no unsteadiness or LOB. Pt denies pain this date. He demonstrates good progress compared to earlier rehab sessions. Will continue to follow PoC to promote return to PLOF.      If plan is discharge home, recommend the following:  A little help with walking and/or transfers;A little help with bathing/dressing/bathroom;Help with stairs or ramp for entrance;Assistance with cooking/housework;Assist for transportation   Equipment Recommendations       Recommendations for Other Services      Precautions / Restrictions Precautions Precautions: Fall Restrictions Weight Bearing Restrictions Per Provider Order: No       Mobility Bed Mobility Overal bed mobility: Modified Independent Bed Mobility: Supine to Sit, Sit to Supine     Supine to sit: HOB elevated, Used rails, Modified independent (Device/Increase time) Sit to supine: Modified independent (Device/Increase time)        Transfers Overall transfer level: Needs assistance Equipment used: Rolling walker (2 wheels) Transfers: Sit to/from Stand Sit to Stand: Supervision, From  elevated surface           General transfer comment: steady and safe technique     Balance Overall balance assessment: Needs assistance Sitting-balance support: No upper extremity supported, Feet supported Sitting balance-Leahy Scale: Good     Standing balance support: Bilateral upper extremity supported, Reliant on assistive device for balance Standing balance-Leahy Scale: Fair                             ADL either performed or assessed with clinical judgement   ADL Overall ADL's : Needs assistance/impaired                     Lower Body Dressing: Moderate assistance Lower Body Dressing Details (indicate cue type and reason): donning sock                    Extremity/Trunk Assessment Upper Extremity Assessment Upper Extremity Assessment: Overall WFL for tasks assessed   Lower Extremity Assessment Lower Extremity Assessment: Overall WFL for tasks assessed        Vision       Perception     Praxis     Communication     Cognition Arousal: Alert Behavior During Therapy: WFL for tasks assessed/performed Cognition: No apparent impairments                                        Cueing      Exercises Other Exercises Other Exercises: Educ re: ECS, falls prevention, importance of OOB mobility    Shoulder Instructions  General Comments      Pertinent Vitals/ Pain       Pain Assessment Pain Assessment: No/denies pain  Home Living                                          Prior Functioning/Environment              Frequency  Min 3X/week        Progress Toward Goals  OT Goals(current goals can now be found in the care plan section)  Progress towards OT goals: Progressing toward goals     Plan      Co-evaluation                 AM-PAC OT 6 Clicks Daily Activity     Outcome Measure   Help from another person eating meals?: None Help from another person taking  care of personal grooming?: None Help from another person toileting, which includes using toliet, bedpan, or urinal?: A Little Help from another person bathing (including washing, rinsing, drying)?: A Lot Help from another person to put on and taking off regular upper body clothing?: A Little Help from another person to put on and taking off regular lower body clothing?: A Lot 6 Click Score: 18    End of Session Equipment Utilized During Treatment: Rolling walker (2 wheels)  OT Visit Diagnosis: Other abnormalities of gait and mobility (R26.89);Muscle weakness (generalized) (M62.81)   Activity Tolerance Patient tolerated treatment well   Patient Left in bed;with call bell/phone within reach;with bed alarm set   Nurse Communication Mobility status;Other (comment) (pt would like to know if his NPO status has been lifted -- if so, would like food and drink)        Time: 8391-8377 OT Time Calculation (min): 14 min  Charges: OT General Charges $OT Visit: 1 Visit OT Treatments $Self Care/Home Management : 8-22 mins  Suzen Hock, PhD, MS, OTR/L 06/30/24, 4:34 PM

## 2024-06-30 NOTE — OR Nursing (Signed)
 Notified Aureliano Louder RN regarding potassium being 5.7 need to be below 5.5 for procedure to be performed

## 2024-06-30 NOTE — Plan of Care (Signed)
  Problem: Education: Goal: Knowledge of General Education information will improve Description: Including pain rating scale, medication(s)/side effects and non-pharmacologic comfort measures 06/30/2024 1916 by Vicci Aureliano DEL, RN Outcome: Progressing 06/30/2024 1916 by Vicci Aureliano DEL, RN Outcome: Progressing   Problem: Health Behavior/Discharge Planning: Goal: Ability to manage health-related needs will improve 06/30/2024 1916 by Vicci Aureliano DEL, RN Outcome: Progressing 06/30/2024 1916 by Vicci Aureliano DEL, RN Outcome: Progressing   Problem: Clinical Measurements: Goal: Ability to maintain clinical measurements within normal limits will improve 06/30/2024 1916 by Vicci Aureliano DEL, RN Outcome: Progressing 06/30/2024 1916 by Vicci Aureliano DEL, RN Outcome: Progressing Goal: Will remain free from infection 06/30/2024 1916 by Vicci Aureliano DEL, RN Outcome: Progressing 06/30/2024 1916 by Vicci Aureliano DEL, RN Outcome: Progressing Goal: Diagnostic test results will improve 06/30/2024 1916 by Vicci Aureliano DEL, RN Outcome: Progressing 06/30/2024 1916 by Vicci Aureliano DEL, RN Outcome: Progressing Goal: Respiratory complications will improve 06/30/2024 1916 by Vicci Aureliano DEL, RN Outcome: Progressing 06/30/2024 1916 by Vicci Aureliano DEL, RN Outcome: Progressing Goal: Cardiovascular complication will be avoided 06/30/2024 1916 by Vicci Aureliano DEL, RN Outcome: Progressing 06/30/2024 1916 by Vicci Aureliano DEL, RN Outcome: Progressing

## 2024-06-30 NOTE — Progress Notes (Signed)
 OT Cancellation Note  Patient Details Name: Jeffery Ponce MRN: [id number] DOB: [birth year]   Cancelled Treatment:    Reason Eval/Treat Not Completed: Patient at procedure or test/ unavailable. Pt is off floor for EGD this date. Will attempt OT session at a later time/date as pt is available.  Suzen Hock 06/30/2024, 12:34 PM
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
## 2024-06-30 NOTE — Transfer of Care (Signed)
 Immediate Anesthesia Transfer of Care Note  Patient: Jeffery Ponce  Procedure(s) Performed: EGD (ESOPHAGOGASTRODUODENOSCOPY) EGD, WITH ARGON PLASMA COAGULATION  Patient Location: PACU  Anesthesia Type:General  Level of Consciousness: sedated  Airway & Oxygen Therapy: Patient Spontanous Breathing  Post-op Assessment: Report given to RN and Post -op Vital signs reviewed and stable  Post vital signs: Reviewed and stable  Last Vitals:  Vitals Value Taken Time  BP 105/50 06/30/24 13:49  Temp 35.8 C 06/30/24 13:44  Pulse 98 06/30/24 13:50  Resp 20 06/30/24 13:50  SpO2 98 % 06/30/24 13:50  Vitals shown include unfiled device data.  Last Pain:  Vitals:   06/30/24 1344  TempSrc: Temporal  PainSc:          Complications: No notable events documented.

## 2024-07-01 LAB — HEMOGLOBIN AND HEMATOCRIT, BLOOD
HCT: 24.9 % — ABNORMAL LOW (ref 39.0–52.0)
Hemoglobin: 7.5 g/dL — ABNORMAL LOW (ref 13.0–17.0)

## 2024-07-01 MED ORDER — OXYCODONE HCL 5 MG PO TABS
10.0000 mg | ORAL_TABLET | Freq: Three times a day (TID) | ORAL | Status: DC | PRN
  Administered 2024-07-01: 10 mg via ORAL
  Filled 2024-07-01: qty 2

## 2024-07-01 NOTE — Progress Notes (Signed)
  Progress Note    07/01/2024 8:04 AM 1 Day Post-Op  Subjective:  Jeffery Ponce is a 78 yo male now POD #2 from:   PROCEDURE: Bilateral common femoral and superficial femoral  endarterectomy with bovine pericardial patch angioplasty. Bilateral profunda femoris endarterectomy with bovine pericardial patch angioplasty (completely separate and distinct second patch used for each profunda femoris repair) Open angioplasty and stent placement of the right common and external iliac arteries. Open angioplasty and stent placement of the left common and external iliac arteries.   Patient is resting comfortably in bed on the floor this morning.  He continues to endorse some soreness to his left groin site and right groin.  Patient has been OOB. He does endorse that his legs feel better overall and his feet feel well.  No noted black tarry stools to report this am or from overnight. No other complaints overnight vitals are remained stable.   Vitals:   07/01/24 0433 07/01/24 0735  BP: (!) 105/52 (!) 107/49  Pulse: 71 72  Resp: 16 16  Temp: 97.9 F (36.6 C) 97.8 F (36.6 C)  SpO2: 98% 98%   Physical Exam: Cardiac:  RRR, Normal S1, S2 with Murmur Lungs:  Clear throughout on auscultation, no rales rhonchi or wheezing Incisions: Bilateral groin incisions with staples intact.  Dressings clean dry and intact.  Slight seroma noted under the skin to the left groin area. Extremities: Bilateral lower extremities warm to touch this morning.  Strong Doppler pulses DP and PT bilaterally Abdomen: Positive bowel sounds throughout, soft, nontender and nondistended Neurologic: Alert and oriented x 3, answers all questions and follows commands appropriately.  CBC    Component Value Date/Time   WBC 11.2 (H) 06/30/2024 0327   RBC 2.52 (L) 06/30/2024 0327   HGB 7.2 (L) 06/30/2024 0327   HCT 22.8 (L) 06/30/2024 0327   PLT 289 06/30/2024 0327   MCV 90.5 06/30/2024 0327   MCH 28.6 06/30/2024 0327   MCHC 31.6  06/30/2024 0327   RDW 15.0 06/30/2024 0327   LYMPHSABS 1.4 06/10/2024 1050   MONOABS 0.5 06/10/2024 1050   EOSABS 0.2 06/10/2024 1050   BASOSABS 0.0 06/10/2024 1050    BMET    Component Value Date/Time   NA 141 06/30/2024 0327   NA 139 12/19/2021 0918   K 5.6 (H) 06/30/2024 1125   CL 112 (H) 06/30/2024 0327   CO2 20 (L) 06/30/2024 0327   GLUCOSE 113 (H) 06/30/2024 0327   BUN 71 (H) 06/30/2024 0327   BUN 21 12/19/2021 0918   CREATININE 2.37 (H) 06/30/2024 0327   CALCIUM  8.5 (L) 06/30/2024 0327   GFRNONAA 27 (L) 06/30/2024 0327   GFRAA 37 (L) 06/20/2020 1008    INR    Component Value Date/Time   INR 1.0 03/16/2024 1148     Intake/Output Summary (Last 24 hours) at 07/01/2024 0804 Last data filed at 07/01/2024 0736 Gross per 24 hour  Intake 340 ml  Output 1750 ml  Net -1410 ml     Assessment/Plan:  78 y.o. male is s/p SEE ABOVE  1 Day Post-Op   PLAN Patient now POD from upper endoscopy. Doing well this morning. Last Stool was yesterday. No blood in stool noted. Will continue to monitor H&H. Possible discharge tomorrow. Patient to follow up in 2 weeks for staple removal.   DVT prophylaxis:  ASA 81 mg Daily   Jeffery Ponce [facility] 07/01/2024 8:04 AM

## 2024-07-01 NOTE — Progress Notes (Signed)
 Occupational Therapy Treatment Patient Details Name: Jeffery Ponce MRN: [id number] DOB: September 27, 1946 Today's Date: 07/01/2024   History of present illness Pt is a 78 year old male s/p bilateral femoral endarterectomy on 06/23/24  PMH significant for CAD, diastolic dysfunction, PAD with intermittent claudication, aortic murmur, aortic atherosclerosis, HTN, HLD, secondary hyperparathyroidism of renal origin, CKD-IV, COPD, GERD (no daily Tx), cirrhosis, anemia of chronic renal disease, nephrolithiasis, OA, chronic opioid therapy   OT comments  Pt seen for OT treatment on this date. Upon arrival to room pt supine in bed with HOB elevated, agreeable to tx. Pt requires Mod I for bed mobility to EOB with HOB elevated and use of bed rail.  Sit to stand transfer to RW with SBA/CGA and ambulation at RW level to bathroom to complete toilet transfer with CGA/SBA overall.  Patient ambulated to sink at RW level and completed standing oral care and grooming tasks with cuing for safe RW placement and cuing for safe management of RW in small spaces requiring CGA/SBA overall for all components of these tasks.  Patient requested to return to bed at end of session, call button placed within reach and bed alarm activated.   Pt making good progress toward goals, will continue to follow POC. Discharge recommendation remains appropriate.        If plan is discharge home, recommend the following:  A little help with walking and/or transfers;A little help with bathing/dressing/bathroom;Help with stairs or ramp for entrance;Assistance with cooking/housework;Assist for transportation   Equipment Recommendations  BSC/3in1;Tub/shower seat    Recommendations for Other Services      Precautions / Restrictions Precautions Precautions: Fall Recall of Precautions/Restrictions: Intact Restrictions Weight Bearing Restrictions Per Provider Order: No       Mobility Bed Mobility Overal bed mobility: Modified Independent Bed  Mobility: Supine to Sit, Sit to Supine     Supine to sit: HOB elevated, Used rails, Modified independent (Device/Increase time) Sit to supine: Modified independent (Device/Increase time)        Transfers Overall transfer level: Needs assistance Equipment used: Rolling walker (2 wheels) Transfers: Sit to/from Stand Sit to Stand: Supervision, From elevated surface                 Balance Overall balance assessment: Needs assistance Sitting-balance support: No upper extremity supported, Feet supported Sitting balance-Leahy Scale: Good     Standing balance support: Single extremity supported, During functional activity Standing balance-Leahy Scale: Fair Standing balance comment: stood at sink for ADL with single UE support                           ADL either performed or assessed with clinical judgement   ADL       Grooming: Wash/dry face;Oral care;Standing Grooming Details (indicate cue type and reason): at sink with RW                 Toilet Transfer: Supervision/safety;Rolling walker (2 wheels);Grab bars Toilet Transfer Details (indicate cue type and reason): transfer to toilet with SBA at RW level                Extremity/Trunk Assessment Upper Extremity Assessment Upper Extremity Assessment: Generalized weakness            Vision       Perception     Praxis     Communication     Cognition Arousal: Alert Behavior During Therapy: [facility] for tasks assessed/performed Cognition: No apparent impairments  Following commands: Intact        Cueing   Cueing Techniques: Verbal cues  Exercises Other Exercises Other Exercises: Education on safety awareness at RW level along with energy conservation techniques in home setting.    Shoulder Instructions       General Comments      Pertinent Vitals/ Pain       Pain Assessment Pain Score: 6  Pain Location: L groin Pain Descriptors /  Indicators: Discomfort, Aching Pain Intervention(s): Limited activity within patient's tolerance, Monitored during session (RN reported patient's morphine  was d/c'd today)  Home Living                                          Prior Functioning/Environment              Frequency  Min 3X/week        Progress Toward Goals  OT Goals(current goals can now be found in the care plan section)  Progress towards OT goals: Progressing toward goals     Plan      Co-evaluation                 AM-PAC OT 6 Clicks Daily Activity     Outcome Measure   Help from another person eating meals?: None Help from another person taking care of personal grooming?: None Help from another person toileting, which includes using toliet, bedpan, or urinal?: A Little Help from another person bathing (including washing, rinsing, drying)?: A Lot Help from another person to put on and taking off regular upper body clothing?: A Little Help from another person to put on and taking off regular lower body clothing?: A Lot 6 Click Score: 18    End of Session Equipment Utilized During Treatment: Rolling walker (2 wheels)  OT Visit Diagnosis: Other abnormalities of gait and mobility (R26.89);Muscle weakness (generalized) (M62.81)   Activity Tolerance Patient tolerated treatment well   Patient Left in bed;with call bell/phone within reach;with bed alarm set   Nurse Communication Mobility status;Other (comment)        Time: 8756-8691 OT Time Calculation (min): 25 min  Charges: OT General Charges $OT Visit: 1 Visit  Harlene Sharps OTR/L   Harlene LITTIE Sharps 07/01/2024, 1:16 PM

## 2024-07-01 NOTE — Progress Notes (Signed)
    Rogelia Copping, MD [facility]   [address], [address] [place], [place] [address] Phone: [phone] Fax : [phone]   Subjective: The patient reports feeling well today.  He is not having any complaints but did have a passage   Objective: Vital signs in last 24 hours: Vitals:   06/30/24 1441 06/30/24 1959 07/01/24 0433 07/01/24 0735  BP: (!) 130/120 (!) 99/48 (!) 105/52 (!) 107/49  Pulse: 94 81 71 72  Resp: 18 16 16 16   Temp: 97.8 F (36.6 C) 97.8 F (36.6 C) 97.9 F (36.6 C) 97.8 F (36.6 C)  TempSrc: Oral  Oral Oral  SpO2: 92% 98% 98% 98%  Weight:      Height:       Weight change:   Intake/Output Summary (Last 24 hours) at 07/01/2024 0841 Last data filed at 07/01/2024 0736 Gross per 24 hour  Intake 340 ml  Output 1750 ml  Net -1410 ml     Exam: General: Alert and orientated x 3.  The patient is in no apparent distress   Lab Results: @LABTEST2 @ Micro Results: Recent Results (from the past 240 hours)  MRSA Next Gen by PCR, Nasal     Status: None   Collection Time: 06/23/24  3:20 PM   Specimen: Nasal Mucosa; Nasal Swab  Result Value Ref Range Status   MRSA by PCR Next Gen NOT DETECTED NOT DETECTED Final    Comment: (NOTE) The GeneXpert MRSA Assay (FDA approved for NASAL specimens only), is one component of a comprehensive MRSA colonization surveillance program. It is not intended to diagnose MRSA infection nor to guide or monitor treatment for MRSA infections. Test performance is not FDA approved in patients less than 44 years old. Performed at [facility], [address], [place], [place] [address]    Studies/Results: No results found. Medications: I have reviewed the patient's current medications. Scheduled Meds:  amLODipine   5 mg Oral Daily   aspirin  EC  81 mg Oral Daily   atorvastatin   10 mg Oral QODAY   Chlorhexidine  Gluconate Cloth  6 each Topical Daily   cholecalciferol   1,000 Units Oral Daily   vitamin B-12  1,000 mcg Oral Daily    dapagliflozin  propanediol  5 mg Oral Daily   docusate sodium   100 mg Oral Daily   morphine   15 mg Oral BID   polyethylene glycol  17 g Oral BID   psyllium  1 packet Oral BID   sodium bicarbonate   650 mg Oral BID   Continuous Infusions:  sodium chloride  75 mL/hr at 07/01/24 0624   nitroGLYCERIN  Stopped (06/23/24 1453)   PRN Meds:.acetaminophen , albuterol , hydrALAZINE , labetalol , metoprolol  tartrate, morphine  injection, ondansetron , ondansetron  (ZOFRAN ) IV, phenol, senna-docusate, sorbitol    Assessment: Principal Problem:   Atherosclerosis of artery of extremity with ulceration (HCC) Active Problems:   Gastric AVM   Melena    Plan: This patient had a EGD with bleeding AVMs in the fundus of the stomach that was treated with argon plasma coagulation with blood seen throughout the stomach.  The patient passed a small amount of old blood this morning with his bowel movements.  He has not had a hemoglobin sent off today so I will send off a hemoglobin.   LOS: 8 days   Rogelia Copping, MD.FACG 07/01/2024, 8:41 AM Pager [phone] 7am-5pm  Check AMION for 5pm -7am coverage and on weekends

## 2024-07-01 NOTE — Progress Notes (Signed)
 [facility]  Daily Progress Note   Subjective  -   Patient states he is feeling better.  Legs are feeling stronger.  Walked well in the hallway yesterday prior to his endoscopy.  Upper GI bleed was found and treated.  Objective Vitals:   06/30/24 1441 06/30/24 1959 07/01/24 0433 07/01/24 0735  BP: (!) 130/120 (!) 99/48 (!) 105/52 (!) 107/49  Pulse: 94 81 71 72  Resp: 18 16 16 16   Temp: 97.8 F (36.6 C) 97.8 F (36.6 C) 97.9 F (36.6 C) 97.8 F (36.6 C)  TempSrc: Oral  Oral Oral  SpO2: 92% 98% 98% 98%  Weight:      Height:        Intake/Output Summary (Last 24 hours) at 07/01/2024 0803 Last data filed at 07/01/2024 0736 Gross per 24 hour  Intake 340 ml  Output 1750 ml  Net -1410 ml    PULM  CTAB CV  RRR VASC  both feet warm with good capillary refill.  Incisions clean, dry, and intact.  Laboratory CBC    Component Value Date/Time   WBC 11.2 (H) 06/30/2024 0327   HGB 7.2 (L) 06/30/2024 0327   HCT 22.8 (L) 06/30/2024 0327   PLT 289 06/30/2024 0327    BMET    Component Value Date/Time   NA 141 06/30/2024 0327   NA 139 12/19/2021 0918   K 5.6 (H) 06/30/2024 1125   CL 112 (H) 06/30/2024 0327   CO2 20 (L) 06/30/2024 0327   GLUCOSE 113 (H) 06/30/2024 0327   BUN 71 (H) 06/30/2024 0327   BUN 21 12/19/2021 0918   CREATININE 2.37 (H) 06/30/2024 0327   CALCIUM  8.5 (L) 06/30/2024 0327   GFRNONAA 27 (L) 06/30/2024 0327   GFRAA 37 (L) 06/20/2020 1008    Assessment/Planning: POD #8 s/p bilateral femoral endarterectomies and bilateral iliac stent placements  GI bleed has been addressed.  Appreciate GI input and assistance. Patient feeling better this morning. We will go with just aspirin  for now and potentially add Plavix  if hemoglobin remains stable Possible discharge home in 1 to 2 days if no further bleeding   Selinda Gu  07/01/2024, 8:03 AM

## 2024-07-01 NOTE — Progress Notes (Signed)
 The patient's hemoglobin came back and was higher than yesterday.  The patient is doing well and nothing further to do from a GI point of view.  I will sign off.  Please call if any further GI concerns or questions.  We would like to thank you for the opportunity to participate in the care of Jeffery Ponce.

## 2024-07-01 NOTE — Plan of Care (Signed)

## 2024-07-02 ENCOUNTER — Telehealth (INDEPENDENT_AMBULATORY_CARE_PROVIDER_SITE_OTHER): Payer: Self-pay

## 2024-07-02 DIAGNOSIS — L97909 Non-pressure chronic ulcer of unspecified part of unspecified lower leg with unspecified severity: Secondary | ICD-10-CM

## 2024-07-02 DIAGNOSIS — I70299 Other atherosclerosis of native arteries of extremities, unspecified extremity: Secondary | ICD-10-CM

## 2024-07-02 LAB — BASIC METABOLIC PANEL WITH GFR
Anion gap: 9 (ref 5–15)
BUN: 61 mg/dL — ABNORMAL HIGH (ref 8–23)
CO2: 19 mmol/L — ABNORMAL LOW (ref 22–32)
Calcium: 8.1 mg/dL — ABNORMAL LOW (ref 8.9–10.3)
Chloride: 113 mmol/L — ABNORMAL HIGH (ref 98–111)
Creatinine, Ser: 2.28 mg/dL — ABNORMAL HIGH (ref 0.61–1.24)
GFR, Estimated: 29 mL/min — ABNORMAL LOW (ref >60)
Glucose, Bld: 92 mg/dL (ref 70–99)
Potassium: 5.1 mmol/L (ref 3.5–5.1)
Sodium: 141 mmol/L (ref 135–145)

## 2024-07-02 NOTE — Telephone Encounter (Signed)
 I attempted to call the patient's son back to let him know that I had spoken with Orvin Daring, NP and Gwendlyn Shank, NP regarding his father taking the Plavix  and Aspirin . And both stated he should take the medication and have a conversation with a provider when he comes in to be seen for a follow up. Patient's son left a message stating the father said Dr. Marea stated he was not to take the Plavix  and he wanted to know which one to do.

## 2024-07-02 NOTE — Progress Notes (Signed)
 Occupational Therapy Treatment Patient Details Name: Jeffery Ponce MRN: [id number] DOB: 12-19-1945 Today's Date: 07/02/2024   History of present illness Pt is a 78 year old male s/p bilateral femoral endarterectomy on 06/23/24  PMH significant for CAD, diastolic dysfunction, PAD with intermittent claudication, aortic murmur, aortic atherosclerosis, HTN, HLD, secondary hyperparathyroidism of renal origin, CKD-IV, COPD, GERD (no daily Tx), cirrhosis, anemia of chronic renal disease, nephrolithiasis, OA, chronic opioid therapy   OT comments  Jeffery Ponce was seen for OT treatment on this date. Upon arrival to room pt in bed, agreeable to tx. Pt requires SUPERVISION + RW for functional mobility ~200 ft, MIN cues to navigate and locate room. MOD I don underwear. Good recall of safety pcns. Pt making good progress toward goals, will continue to follow POC. Discharge recommendation remains appropriate.        If plan is discharge home, recommend the following:  A little help with walking and/or transfers;A little help with bathing/dressing/bathroom;Help with stairs or ramp for entrance;Assistance with cooking/housework;Assist for transportation   Equipment Recommendations  BSC/3in1;Tub/shower seat    Recommendations for Other Services      Precautions / Restrictions Precautions Precautions: Fall Recall of Precautions/Restrictions: Intact Restrictions Weight Bearing Restrictions Per Provider Order: No       Mobility Bed Mobility Overal bed mobility: Independent                  Transfers Overall transfer level: Needs assistance Equipment used: Rolling walker (2 wheels) Transfers: Sit to/from Stand Sit to Stand: Supervision                 Balance Overall balance assessment: Needs assistance Sitting-balance support: No upper extremity supported, Feet supported Sitting balance-Leahy Scale: Good     Standing balance support: No upper extremity supported, During functional  activity Standing balance-Leahy Scale: Good                             ADL either performed or assessed with clinical judgement   ADL Overall ADL's : Needs assistance/impaired                                       General ADL Comments: SUPERVISION + RW for functional mobility ~200 ft, MIN cues to navigate and locate room. MOD I don underwear    Extremity/Trunk Assessment              Vision       Perception     Praxis     Communication Communication Communication: No apparent difficulties   Cognition Arousal: Alert Behavior During Therapy: WFL for tasks assessed/performed Cognition: No apparent impairments                               Following commands: Intact        Cueing   Cueing Techniques: Verbal cues  Exercises      Shoulder Instructions       General Comments      Pertinent Vitals/ Pain       Pain Assessment Pain Assessment: No/denies pain  Home Living  Prior Functioning/Environment              Frequency  Min 3X/week        Progress Toward Goals  OT Goals(current goals can now be found in the care plan section)  Progress towards OT goals: Progressing toward goals  Acute Rehab OT Goals Patient Stated Goal: go home OT Goal Formulation: With patient Time For Goal Achievement: 07/08/24 Potential to Achieve Goals: Good ADL Goals Pt Will Perform Grooming: with modified independence;sitting;standing Pt Will Perform Lower Body Dressing: with modified independence;sitting/lateral leans;sit to/from stand Pt Will Transfer to Toilet: with modified independence;ambulating Pt Will Perform Toileting - Clothing Manipulation and hygiene: with modified independence;sitting/lateral leans;sit to/from stand  Plan      Co-evaluation                 AM-PAC OT 6 Clicks Daily Activity     Outcome Measure   Help from another person  eating meals?: None Help from another person taking care of personal grooming?: None Help from another person toileting, which includes using toliet, bedpan, or urinal?: A Little Help from another person bathing (including washing, rinsing, drying)?: A Lot Help from another person to put on and taking off regular upper body clothing?: A Little Help from another person to put on and taking off regular lower body clothing?: A Lot 6 Click Score: 18    End of Session Equipment Utilized During Treatment: Rolling walker (2 wheels)  OT Visit Diagnosis: Other abnormalities of gait and mobility (R26.89);Muscle weakness (generalized) (M62.81)   Activity Tolerance Patient tolerated treatment well   Patient Left in bed;with call bell/phone within reach   Nurse Communication          Time: 1047-1100 OT Time Calculation (min): 13 min  Charges: OT General Charges $OT Visit: 1 Visit OT Treatments $Therapeutic Activity: 8-22 mins  Jeffery Ponce, M.S. OTR/L  07/02/24, 1:03 PM  ascom [phone]

## 2024-07-02 NOTE — Discharge Instructions (Signed)
 Vascular Surgery Discharge Instructions  Do not lift anything heavy until your staples are removed.  Do not lift anything more than a gallon of milk.  You may shower tomorrow on 07/03/2024 when you get home.  After showering completely dry both groins with staples.  Leave open to air.  Paint staples with Betadine sticks until staples removed.  Do not apply any creams or ointments or antibacterial ointments.  You may not drive until staples are removed.  Follow-up with vein and vascular surgery as scheduled.

## 2024-07-02 NOTE — Discharge Summary (Signed)
 [facility]    Discharge Summary    Patient ID:  Jeffery Ponce MRN: [id number] DOB/AGE: 79-Jan-1947 79 y.o.  Admit date: 06/23/2024 Discharge date: 07/02/2024 Date of Surgery: 06/23/2024 Surgeon: Surgeon(s): Jeffery Carmine, MD Dr Selinda Gu MD and Dr Cordella Shawl MD   Admission Diagnosis: Atherosclerosis of native artery of both lower extremities with bilateral ulceration, unspecified ulceration site [name], [name], [name], [name], [name], [name]  Diagnosis Date   Anemia in CKD (chronic kidney disease)    Aortic atherosclerosis (HCC)    Aortic systolic heart murmur (grade II/VI)    Arthritis    Atherosclerosis of native arteries of extremity with intermittent claudication (HCC)    CAD (coronary artery disease)    CKD (chronic kidney disease) stage 4, GFR 15-29 ml/min (HCC)    Colon polyps    COPD (chronic obstructive pulmonary disease) (HCC)    Diastolic dysfunction    Former smoker    GERD (gastroesophageal reflux disease)    GIB (gastrointestinal bleeding)    History of blood transfusion    History of kidney stones    Hypercholesteremia    Hypertension    Liver cirrhosis (HCC)    Long term (current) use of opiate analgesic    Long term current use of clopidogrel     Long-term use of aspirin  therapy    Pneumonia    Secondary hyperparathyroidism of renal origin (HCC)    Sigmoid diverticulosis    Urothelial carcinoma of bladder (HCC)    a.) s/p TURBT for T2a N0 urothelial cancer on 11/19/2016; b.) s/p cystoprostatectomy and ileal conduit on 07/30/2017    Procedure(s): EGD  (ESOPHAGOGASTRODUODENOSCOPY) EGD, WITH ARGON PLASMA COAGULATION  Discharged Condition: good  HPI:  Patient presented to [facility] due to severe atherosclerosis requiring the following procedure on 06/23/24 :  PROCEDURE: 1.   Left common femoral and superficial femoral artery endarterectomies and bovine pericardial patch angioplasty 2.   Right common femoral and superficial femoral artery endarterectomies and bovine pericardial patch angioplasty 3.   Separate and distinct left profunda femoris endarterectomy and bovine pericardial patch angioplasty 4.   Separate distinct right profunda femoris endarterectomy and bovine pericardial patch angioplasty 5.   Aortogram and bilateral iliofemoral angiograms 6.  Kissing balloon expandable stent placements to bilateral common iliac arteries up into the distal aorta with 9 mm diameter by 37 mm length Lifestream stent on the right and 9 mm diameter by 58 mm length Lifestream stent on the left 7.  Additional stent placements in the right external iliac artery with 8 mm diameter by 37 mm length Lifestream stent and 9 mm diameter by 7.5 cm length Viabahn stents 8.  Additional stent placement in the left external iliac artery with 8 mm diameter by 58 mm length Lifestream stent and 9 mm diameter by 10 cm length Viabahn stent 9.  Placement of antibiotic impregnated beads into both groins   Hospital Course:  Jeffery Ponce is a 78 y.o. male is S/P Bilateral femoral endarterectomies. He is recovering as expected. No complications to note. He did undergo upper endoscopy while here at this admission. See Dr Ponce Daniels note for findings. Patient participated well with physical therapy. Is ambulating , eating and urinating  well. Patient to be discharged home today. No complications to note. Vitals all remain stable.   Patient will be discharged on ASA 81 mg daily, Plavix  75 mg daily and Lipitor 10 mg daily.  Patient was counseled not to skip or  miss taking any of these medications as it will affect the outcome of her surgery.  Patient verbalizes understanding.  I spent greater than 60 minutes preparing teaching and implementing the patient's discharge today.  Extubated: POD # 0 Physical Exam:  Alert notes x3, no acute distress Face: Symmetrical.  Tongue is midline. Neck: Trachea is midline.  No swelling or bruising. Cardiovascular: Regular rate and rhythm Pulmonary: Clear to auscultation bilaterally Abdomen: Soft, nontender, nondistended Right groin access: Clean dry and intact.  No swelling or drainage noted Left groin access: Clean dry and intact.  No swelling or drainage noted Left lower extremity: Thigh soft.  Calf soft.  Extremities warm distally toes.  Hard to palpate pedal pulses however the foot is warm is her good capillary refill. Right lower extremity: Thigh soft.  Calf soft.  Extremities warm distally toes.  Hard to palpate pedal pulses however the foot is warm is her good capillary refill. Neurological: No deficits noted   Post-op wounds:  clean, dry, intact or healing well  Pt. Ambulating, voiding and taking PO diet without difficulty. Pt pain controlled with PO pain meds.  Labs:  As below  Complications: none  Consults:  Treatment Team:  Unk Corinn Skiff, MD  Significant Diagnostic Studies: CBC Lab Results  Component Value Date   WBC 11.2 (H) 06/30/2024   HGB 7.5 (L) 07/01/2024   HCT 24.9 (L) 07/01/2024   MCV 90.5 06/30/2024   PLT 289 06/30/2024    BMET    Component Value Date/Time   NA 141 07/02/2024 0523   NA 139 12/19/2021 0918   K 5.1 07/02/2024 0523   CL 113 (H) 07/02/2024 0523   CO2 19 (L) 07/02/2024 0523   GLUCOSE 92 07/02/2024 0523   BUN 61 (H) 07/02/2024 0523   BUN 21 12/19/2021 0918   CREATININE 2.28 (H) 07/02/2024 0523   CALCIUM  8.1 (L) 07/02/2024 0523   GFRNONAA 29 (L) 07/02/2024 0523   GFRAA 37 (L) 06/20/2020 1008   COAG Lab Results  Component Value Date   INR  1.0 03/16/2024     Disposition:  Discharge to :Home  Allergies as of 07/02/2024       Reactions   Penicillins Anaphylaxis, Other (See Comments)   Ciprofloxacin  Itching   Itching at IV site. No hives or shortness of breathe.   Statins Other (See Comments)   Ambien  [zolpidem  Tartrate] Other (See Comments)   Sleep walking        Medication List     TAKE these medications    acetaminophen  500 MG tablet Commonly known as: TYLENOL  Take 500 mg by mouth every 6 (six) hours as needed for moderate pain.   albuterol  108 (90 Base) MCG/ACT inhaler Commonly known as: VENTOLIN  HFA Inhale 1-2 puffs into the lungs every 6 (six) hours as needed for wheezing or shortness of breath.   amLODipine  5 MG tablet Commonly known as: NORVASC  Take 5 mg by mouth every morning.   atorvastatin  10 MG tablet Commonly known as: Lipitor Take 1 tablet (10 mg total) by mouth daily. What changed: when to take this   cholecalciferol  25 MCG (1000 UNIT) tablet Commonly known as: VITAMIN D3 Take 1,000 Units by mouth daily.   clopidogrel  75 MG tablet Commonly  known as: Plavix  Take 1 tablet (75 mg total) by mouth daily.   Farxiga  5 MG Tabs tablet Generic drug: dapagliflozin  propanediol 5 mg every morning.   GNP Aspirin  Low Dose 81 MG tablet Generic drug: aspirin  EC TAKE 1 TABLET BY MOUTH ONCE DAILY. What changed:  how much to take how to take this when to take this   lisinopril  2.5 MG tablet Commonly known as: ZESTRIL  Take 1 tablet (2.5 mg total) by mouth daily.   morphine  15 MG 12 hr tablet Commonly known as: MS CONTIN  Take 15 mg by mouth every 12 (twelve) hours.   Oxycodone  HCl 10 MG Tabs 1 tablet as needed Orally every 4 hours as needed   psyllium 95 % Pack Commonly known as: HYDROCIL/METAMUCIL Take 1 packet by mouth 2 (two) times daily.   sodium bicarbonate  650 MG tablet Take 1 tablet (650 mg total) by mouth 2 (two) times daily.   Spiriva  HandiHaler 18 MCG inhalation  capsule Generic drug: tiotropium Place 1 capsule (18 mcg total) into inhaler and inhale daily. What changed:  when to take this reasons to take this additional instructions               Durable Medical Equipment  (From admission, onward)           Start     Ordered   06/24/24 1327  For home use only DME Walker rolling  Once       Question Answer Comment  Walker: With 5 Inch Wheels   Patient needs a walker to treat with the following condition PAD (peripheral artery disease) (HCC)      06/24/24 1326           Verbal and written Discharge instructions given to the patient. Wound care per Discharge AVS  Follow-up Information     Katrinka Aquas, MD Follow up.   Specialty: Internal Medicine Contact information: [address] [place] [address] [phone]         Marea Selinda RAMAN, MD Follow up in 3 week(s).   Specialties: Vascular Surgery, Radiology, Interventional Cardiology Why: Staple removal with Arterial Duplex U/S bilateral lower extremities with ABI's Contact information: [address] [place] [address] [phone]                 Signed: Gwendlyn JONELLE Shank, NP  07/02/2024, 10:37 AM

## 2024-07-02 NOTE — Progress Notes (Signed)
 Physical Therapy Treatment Patient Details Name: Jeffery Ponce MRN: [id number] DOB: 11/10/1946 Today's Date: 07/02/2024   History of Present Illness Pt is a 78 year old male s/p bilateral femoral endarterectomy on 06/23/24  PMH significant for CAD, diastolic dysfunction, PAD with intermittent claudication, aortic murmur, aortic atherosclerosis, HTN, HLD, secondary hyperparathyroidism of renal origin, CKD-IV, COPD, GERD (no daily Tx), cirrhosis, anemia of chronic renal disease, nephrolithiasis, OA, chronic opioid therapy    PT Comments  Pt is making great progress towards goals with ability to ambulate 2 laps around small RN station with RW. Reports no pain and is eager to dc home this date. Will continue to progress. DME at bedside.    If plan is discharge home, recommend the following: A little help with walking and/or transfers;Assistance with cooking/housework;A little help with bathing/dressing/bathroom;Assist for transportation;Help with stairs or ramp for entrance   Can travel by private vehicle        Equipment Recommendations  None recommended by PT    Recommendations for Other Services       Precautions / Restrictions Precautions Precautions: Fall Recall of Precautions/Restrictions: Intact Restrictions Weight Bearing Restrictions Per Provider Order: No     Mobility  Bed Mobility Overal bed mobility: Modified Independent Bed Mobility: Supine to Sit, Sit to Supine     Supine to sit: Modified independent (Device/Increase time) Sit to supine: Modified independent (Device/Increase time)   General bed mobility comments: safe technique.    Transfers Overall transfer level: Needs assistance Equipment used: Rolling walker (2 wheels) Transfers: Sit to/from Stand Sit to Stand: Supervision           General transfer comment: safe technique with upright posture    Ambulation/Gait Ambulation/Gait assistance: Supervision Gait Distance (Feet): 200 Feet Assistive  device: Rolling walker (2 wheels) Gait Pattern/deviations: Step-through pattern, Decreased stride length       General Gait Details: ambulated around nursing station with RW. Able to converse throughout session with reciprocal gait pattern. Safe technique   Stairs Stairs:  (refused stair training this date)           Wheelchair Mobility     Tilt Bed    Modified Rankin (Stroke Patients Only)       Balance Overall balance assessment: Needs assistance Sitting-balance support: No upper extremity supported, Feet supported Sitting balance-Leahy Scale: Good     Standing balance support: Single extremity supported, During functional activity Standing balance-Leahy Scale: Fair                              Hotel manager: No apparent difficulties  Cognition Arousal: Alert Behavior During Therapy: WFL for tasks assessed/performed   PT - Cognitive impairments: No apparent impairments                       PT - Cognition Comments: pleasant and agreeable to session Following commands: Intact      Cueing Cueing Techniques: Verbal cues  Exercises      General Comments        Pertinent Vitals/Pain Pain Assessment Pain Assessment: No/denies pain    Home Living                          Prior Function            PT Goals (current goals can now be found in the care plan section) Acute Rehab PT  Goals Patient Stated Goal: decreased pain, get better PT Goal Formulation: With patient Time For Goal Achievement: 07/08/24 Potential to Achieve Goals: Good Progress towards PT goals: Progressing toward goals    Frequency    Min 2X/week      PT Plan      Co-evaluation              AM-PAC PT 6 Clicks Mobility   Outcome Measure  Help needed turning from your back to your side while in a flat bed without using bedrails?: None Help needed moving from lying on your back to sitting on the side of a  flat bed without using bedrails?: None Help needed moving to and from a bed to a chair (including a wheelchair)?: None Help needed standing up from a chair using your arms (e.g., wheelchair or bedside chair)?: None Help needed to walk in hospital room?: A Little Help needed climbing 3-5 steps with a railing? : A Little 6 Click Score: 22    End of Session   Activity Tolerance: Patient tolerated treatment well Patient left: in bed;with bed alarm set;with call bell/phone within reach (declined chair this date) Nurse Communication: Mobility status PT Visit Diagnosis: Muscle weakness (generalized) (M62.81);Other abnormalities of gait and mobility (R26.89);Pain Pain - Right/Left: Left Pain - part of body: Leg     Time: 9070-9062 PT Time Calculation (min) (ACUTE ONLY): 8 min  Charges:    $Gait Training: 8-22 mins PT General Charges $$ ACUTE PT VISIT: 1 Visit                     Corean Dade, PT, DPT, GCS [phone]    Chala Gul 07/02/2024, 11:07 AM

## 2024-07-06 ENCOUNTER — Telehealth (INDEPENDENT_AMBULATORY_CARE_PROVIDER_SITE_OTHER): Payer: Self-pay

## 2024-07-06 ENCOUNTER — Telehealth: Payer: Self-pay | Admitting: *Deleted

## 2024-07-06 NOTE — Telephone Encounter (Signed)
 Multiple attempts made to contact patient to reschedule missed lab and provider visit while in hospital.  Voice mails left for a return call.

## 2024-07-06 NOTE — Telephone Encounter (Signed)
 Patient's son called back asking about the message to return a call on 07/02/24. Patient's son was given the recommendation from [facility] NP's.

## 2024-07-07 ENCOUNTER — Encounter: Payer: Self-pay | Admitting: Vascular Surgery

## 2024-07-12 ENCOUNTER — Telehealth (INDEPENDENT_AMBULATORY_CARE_PROVIDER_SITE_OTHER): Payer: Self-pay

## 2024-07-12 NOTE — Telephone Encounter (Signed)
 Patient son reach out to informed that his father had endoscopy done in the hospital and found that the bleeding was found in the stomach after his surgery. The patient had ble femoral endarterectomy on 06/23/24.Patient son stated that his father is still experiencing black stools. Patient is scheduled to come in 07/26/24. Patient son would like to know if his father will need to be seen sooner. Please Advise

## 2024-07-12 NOTE — Telephone Encounter (Signed)
 Left message on patient son voicemail to return call to the office

## 2024-07-12 NOTE — Telephone Encounter (Signed)
 I would recommend that he reach out to GI if he is still having black stools to see if this may be expected.  Sometimes it takes a bit of time for the blood to work its way through the system

## 2024-07-13 NOTE — Telephone Encounter (Signed)
 Patient son was notified with medical recommendations and verbalized understanding

## 2024-07-14 ENCOUNTER — Inpatient Hospital Stay: Attending: Hematology

## 2024-07-14 DIAGNOSIS — Z880 Allergy status to penicillin: Secondary | ICD-10-CM | POA: Diagnosis not present

## 2024-07-14 DIAGNOSIS — J439 Emphysema, unspecified: Secondary | ICD-10-CM | POA: Insufficient documentation

## 2024-07-14 DIAGNOSIS — D500 Iron deficiency anemia secondary to blood loss (chronic): Secondary | ICD-10-CM | POA: Diagnosis present

## 2024-07-14 DIAGNOSIS — N184 Chronic kidney disease, stage 4 (severe): Secondary | ICD-10-CM | POA: Insufficient documentation

## 2024-07-14 DIAGNOSIS — Z888 Allergy status to other drugs, medicaments and biological substances status: Secondary | ICD-10-CM | POA: Insufficient documentation

## 2024-07-14 DIAGNOSIS — I700 Atherosclerosis of aorta: Secondary | ICD-10-CM | POA: Insufficient documentation

## 2024-07-14 DIAGNOSIS — Z881 Allergy status to other antibiotic agents status: Secondary | ICD-10-CM | POA: Insufficient documentation

## 2024-07-14 DIAGNOSIS — Z79899 Other long term (current) drug therapy: Secondary | ICD-10-CM | POA: Diagnosis not present

## 2024-07-14 DIAGNOSIS — Z7982 Long term (current) use of aspirin: Secondary | ICD-10-CM | POA: Insufficient documentation

## 2024-07-14 DIAGNOSIS — K922 Gastrointestinal hemorrhage, unspecified: Secondary | ICD-10-CM | POA: Insufficient documentation

## 2024-07-14 DIAGNOSIS — K31811 Angiodysplasia of stomach and duodenum with bleeding: Secondary | ICD-10-CM | POA: Diagnosis not present

## 2024-07-14 DIAGNOSIS — F17200 Nicotine dependence, unspecified, uncomplicated: Secondary | ICD-10-CM | POA: Diagnosis not present

## 2024-07-14 DIAGNOSIS — K5900 Constipation, unspecified: Secondary | ICD-10-CM | POA: Insufficient documentation

## 2024-07-14 DIAGNOSIS — Z7902 Long term (current) use of antithrombotics/antiplatelets: Secondary | ICD-10-CM | POA: Insufficient documentation

## 2024-07-14 DIAGNOSIS — Z8551 Personal history of malignant neoplasm of bladder: Secondary | ICD-10-CM | POA: Diagnosis not present

## 2024-07-14 DIAGNOSIS — D631 Anemia in chronic kidney disease: Secondary | ICD-10-CM

## 2024-07-14 LAB — COMPREHENSIVE METABOLIC PANEL WITH GFR
ALT: 14 U/L (ref 0–44)
AST: 15 U/L (ref 15–41)
Albumin: 3.2 g/dL — ABNORMAL LOW (ref 3.5–5.0)
Alkaline Phosphatase: 92 U/L (ref 38–126)
Anion gap: 10 (ref 5–15)
BUN: 42 mg/dL — ABNORMAL HIGH (ref 8–23)
CO2: 22 mmol/L (ref 22–32)
Calcium: 8.7 mg/dL — ABNORMAL LOW (ref 8.9–10.3)
Chloride: 106 mmol/L (ref 98–111)
Creatinine, Ser: 2.42 mg/dL — ABNORMAL HIGH (ref 0.61–1.24)
GFR, Estimated: 27 mL/min — ABNORMAL LOW (ref >60)
Glucose, Bld: 150 mg/dL — ABNORMAL HIGH (ref 70–99)
Potassium: 4.1 mmol/L (ref 3.5–5.1)
Sodium: 138 mmol/L (ref 135–145)
Total Bilirubin: 0.5 mg/dL (ref 0.0–1.2)
Total Protein: 6.6 g/dL (ref 6.5–8.1)

## 2024-07-14 LAB — IRON AND TIBC
Iron: 36 ug/dL — ABNORMAL LOW (ref 45–182)
Saturation Ratios: 13 % — ABNORMAL LOW (ref 17.9–39.5)
TIBC: 278 ug/dL (ref 250–450)
UIBC: 242 ug/dL

## 2024-07-14 LAB — CBC WITH DIFFERENTIAL/PLATELET
Abs Immature Granulocytes: 0.07 K/uL (ref 0.00–0.07)
Basophils Absolute: 0 K/uL (ref 0.0–0.1)
Basophils Relative: 0 %
Eosinophils Absolute: 0.7 K/uL — ABNORMAL HIGH (ref 0.0–0.5)
Eosinophils Relative: 6 %
HCT: 25.7 % — ABNORMAL LOW (ref 39.0–52.0)
Hemoglobin: 7.7 g/dL — ABNORMAL LOW (ref 13.0–17.0)
Immature Granulocytes: 1 %
Lymphocytes Relative: 14 %
Lymphs Abs: 1.5 K/uL (ref 0.7–4.0)
MCH: 28.8 pg (ref 26.0–34.0)
MCHC: 30 g/dL (ref 30.0–36.0)
MCV: 96.3 fL (ref 80.0–100.0)
Monocytes Absolute: 0.7 K/uL (ref 0.1–1.0)
Monocytes Relative: 7 %
Neutro Abs: 7.7 K/uL (ref 1.7–7.7)
Neutrophils Relative %: 72 %
Platelets: 350 K/uL (ref 150–400)
RBC: 2.67 MIL/uL — ABNORMAL LOW (ref 4.22–5.81)
RDW: 14.7 % (ref 11.5–15.5)
WBC: 10.7 K/uL — ABNORMAL HIGH (ref 4.0–10.5)
nRBC: 0 % (ref 0.0–0.2)

## 2024-07-14 LAB — FERRITIN: Ferritin: 49 ng/mL (ref 24–336)

## 2024-07-15 ENCOUNTER — Other Ambulatory Visit: Payer: Self-pay

## 2024-07-15 ENCOUNTER — Inpatient Hospital Stay (HOSPITAL_BASED_OUTPATIENT_CLINIC_OR_DEPARTMENT_OTHER): Admitting: Oncology

## 2024-07-15 VITALS — BP 110/48 | HR 65 | Resp 18 | Wt 160.1 lb

## 2024-07-15 DIAGNOSIS — R918 Other nonspecific abnormal finding of lung field: Secondary | ICD-10-CM | POA: Diagnosis not present

## 2024-07-15 DIAGNOSIS — N184 Chronic kidney disease, stage 4 (severe): Secondary | ICD-10-CM

## 2024-07-15 DIAGNOSIS — D500 Iron deficiency anemia secondary to blood loss (chronic): Secondary | ICD-10-CM

## 2024-07-15 DIAGNOSIS — C679 Malignant neoplasm of bladder, unspecified: Secondary | ICD-10-CM | POA: Diagnosis not present

## 2024-07-15 DIAGNOSIS — Z720 Tobacco use: Secondary | ICD-10-CM

## 2024-07-15 MED ORDER — FERROUS SULFATE 325 (65 FE) MG PO TBEC: 325.0000 mg | DELAYED_RELEASE_TABLET | ORAL | 3 refills | Status: AC

## 2024-07-15 NOTE — Assessment & Plan Note (Addendum)
 Patient with a history of smoking and lung nodules on low-dose CT Recommended low-dose CT yearly  - Due for next CT 11/2024

## 2024-07-15 NOTE — Progress Notes (Signed)
 [facility]  HEMATOLOGY FOLLOW-UP VISIT  Jeffery Aquas, MD  REASON FOR FOLLOW-UP: Iron  deficiency anemia  ASSESSMENT & PLAN:  Patient is a 78 y.o. male following for iron  deficiency anemia  Assessment & Plan Iron  deficiency anemia due to chronic blood loss Patient has a history of iron  deficiency anemia secondary to GI blood loss Recent endoscopy with angiodysplasia s/p APC Labs reviewed today consistent with severe iron  deficiency  -We discussed some of the risks, benefits, and alternatives of intravenous iron  infusions. The patient is symptomatic from anemia and the iron  level is critically low. He tolerated oral iron  supplement poorly and desires to achieve higher levels of iron  faster for adequate hematopoesis. Some of the side-effects to be expected including risks of infusion reactions, phlebitis, headaches, nausea and fatigue.  The patient is willing to proceed. Patient education material was dispensed. Goal is to keep ferritin level greater than 50 and resolution of anemia - Start oral iron  every other day.  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 As there was suspicion for muscle invasion, underwent cisplatin  and gemcitabine  4 cycles from 12/30/2016 through 04/21/2017 followed by cystoprostatectomy and ileal conduit on 07/30/2017, showing residual disease of 0.4 cm. Follows with Dr. Alvaro and is getting frequent CT scans.  Recent one per patient was last month  -Continue to follow with urology Lung nodules Patient with a history of smoking and lung nodules on low-dose CT Recommended low-dose CT yearly  - Due for next CT 11/2024 Tobacco abuse Patient reports smoking since he was 78 years old.  Quit smoking after hospitalization in October 2024.  CKD (chronic kidney disease), stage  IV (HCC) Patient has stable chronic kidney disease  -Continue to follow with Dr. Rachele -Would have a TSAT goal of 25-30 for IV iron  as long as ferritin is less than 400 -If iron  levels are normal and anemia persists, will consider multiple myeloma workup and starting EPO shots if hemoglobin is less than 10    Orders Placed This Encounter  Procedures   Ferritin    Standing Status:   Future    Expected Date:   09/13/2024    Expiration Date:   12/12/2024   Folate    Standing Status:   Future    Expected Date:   09/13/2024    Expiration Date:   12/12/2024   Vitamin B12    Standing Status:   Future    Expected Date:   09/13/2024    Expiration Date:   12/12/2024   CBC with Differential/Platelet    Standing Status:   Future    Expected Date:   09/13/2024    Expiration Date:   12/12/2024   Comprehensive metabolic panel with GFR    Standing Status:   Future    Expected Date:   09/13/2024    Expiration Date:   12/12/2024   Iron  and TIBC    Standing Status:   Future    Expected Date:   09/13/2024    Expiration Date:   12/12/2024    The total time spent in the appointment was 40 minutes encounter with patients including review of chart and various tests results, discussions about plan of care and coordination of care plan   All questions were answered. The patient knows to call the clinic with any problems,  questions or concerns. No barriers to learning was detected.  Mickiel Dry, MD 8/7/20254:32 PM   INTERVAL HISTORY: Jeffery Ponce 78 y.o. male following for iron  deficiency anemia. Patient was accompanied by his friend today.   Patient had bilateral femoral artery endarterectomies and angioplasty done last week.  At the same time he had an EGD done for anemia and was found to have angiodysplasia of duodenum s/p APC.  He also received 2 units of blood transfusion at that time. Patient reports tiredness today but has no other complaints.  He reported he had black-colored stools prior to the  procedure but does not have them anymore.  He denies shortness of breath, weight loss or loss of appetite.  He previously received IV iron  with no adverse reactions.   I have reviewed the past medical history, past surgical history, social history and family history with the patient   ALLERGIES:  is allergic to penicillins, ciprofloxacin , statins, and ambien  [zolpidem  tartrate].  MEDICATIONS:  Current Outpatient Medications  Medication Sig Dispense Refill   acetaminophen  (TYLENOL ) 500 MG tablet Take 500 mg by mouth every 6 (six) hours as needed for moderate pain.     albuterol  (VENTOLIN  HFA) 108 (90 Base) MCG/ACT inhaler Inhale 1-2 puffs into the lungs every 6 (six) hours as needed for wheezing or shortness of breath.     amLODipine  (NORVASC ) 5 MG tablet Take 5 mg by mouth every morning.     atorvastatin  (LIPITOR) 10 MG tablet Take 1 tablet (10 mg total) by mouth daily. 30 tablet 11   cholecalciferol  (VITAMIN D ) 25 MCG (1000 UT) tablet Take 1,000 Units by mouth daily.   4   clopidogrel  (PLAVIX ) 75 MG tablet Take 1 tablet (75 mg total) by mouth daily. 30 tablet 11   FARXIGA  5 MG TABS tablet 5 mg every morning.     ferrous sulfate  325 (65 FE) MG EC tablet Take 1 tablet (325 mg total) by mouth every other day. 45 tablet 3   GNP ASPIRIN  LOW DOSE 81 MG EC tablet TAKE 1 TABLET BY MOUTH ONCE DAILY. 30 tablet 11   lisinopril  (ZESTRIL ) 2.5 MG tablet Take 1 tablet (2.5 mg total) by mouth daily. 30 tablet 0   morphine  (MS CONTIN ) 15 MG 12 hr tablet Take 15 mg by mouth every 12 (twelve) hours.     Oxycodone  HCl 10 MG TABS 1 tablet as needed Orally every 4 hours as needed     psyllium (HYDROCIL/METAMUCIL) 95 % PACK Take 1 packet by mouth 2 (two) times daily. 500 each 2   sodium bicarbonate  650 MG tablet Take 1 tablet (650 mg total) by mouth 2 (two) times daily. 180 tablet 2   tiotropium (SPIRIVA  HANDIHALER) 18 MCG inhalation capsule Place 1 capsule (18 mcg total) into inhaler and inhale daily. 30 capsule 3    No current facility-administered medications for this visit.   Facility-Administered Medications Ordered in Other Visits  Medication Dose Route Frequency Provider Last Rate Last Admin   ePHEDrine  sulfate (PF) 5mg /mL syringe   Intravenous Anesthesia Intra-op Struick, Cynthia, CRNA   10 mg at 06/30/24 1336   lidocaine  (cardiac) 100 mg/63mL (XYLOCAINE ) injection 2%   Intravenous Anesthesia Intra-op Struick, Cynthia, CRNA   60 mg at 06/30/24 1325   propofol  (DIPRIVAN ) 10 mg/mL bolus/IV push   Intravenous Anesthesia Intra-op Struick, Cynthia, CRNA   20 mg at 06/30/24 1335     REVIEW OF SYSTEMS:   Constitutional: Denies fevers, chills or night sweats Eyes: Denies blurriness  of vision Ears, nose, mouth, throat, and face: Denies mucositis or sore throat Respiratory: Denies cough, dyspnea or wheezes Cardiovascular: Denies palpitation, chest discomfort or lower extremity swelling Gastrointestinal:  Denies nausea, heartburn or change in bowel habits Skin: Denies abnormal skin rashes Lymphatics: Denies new lymphadenopathy or easy bruising Neurological:Denies numbness, tingling or new weaknesses Behavioral/Psych: Mood is stable, no new changes  All other systems were reviewed with the patient and are negative.  PHYSICAL EXAMINATION:   Vitals:   07/15/24 1300  BP: (!) 110/48  Pulse: 65  Resp: 18  SpO2: 100%    GENERAL:alert, no distress and comfortable SKIN: skin color, texture, turgor are normal, no rashes or significant lesions LYMPH:  no palpable lymphadenopathy in the cervical, axillary or inguinal LUNGS: clear to auscultation and percussion with normal breathing effort HEART: regular rate & rhythm and no murmurs and no lower extremity edema ABDOMEN:abdomen soft, non-tender and normal bowel sounds Musculoskeletal:no cyanosis of digits and no clubbing  NEURO: alert & oriented x 3 with fluent speech  LABORATORY DATA:  I have reviewed the data as listed  Lab Results  Component  Value Date   WBC 10.7 (H) 07/14/2024   NEUTROABS 7.7 07/14/2024   HGB 7.7 (L) 07/14/2024   HCT 25.7 (L) 07/14/2024   MCV 96.3 07/14/2024   PLT 350 07/14/2024      Chemistry      Component Value Date/Time   NA 138 07/14/2024 1242   NA 139 12/19/2021 0918   K 4.1 07/14/2024 1242   CL 106 07/14/2024 1242   CO2 22 07/14/2024 1242   BUN 42 (H) 07/14/2024 1242   BUN 21 12/19/2021 0918   CREATININE 2.42 (H) 07/14/2024 1242      Component Value Date/Time   CALCIUM  8.7 (L) 07/14/2024 1242   ALKPHOS 92 07/14/2024 1242   AST 15 07/14/2024 1242   ALT 14 07/14/2024 1242   BILITOT 0.5 07/14/2024 1242   BILITOT 0.3 12/19/2021 0918      Latest Reference Range & Units 07/14/24 12:42  Iron  45 - 182 ug/dL 36 (L)  UIBC ug/dL 757  TIBC 749 - 549 ug/dL 721  Saturation Ratios 17.9 - 39.5 % 13 (L)  Ferritin 24 - 336 ng/mL 49  (L): Data is abnormally low  Endoscopy: 06/30/2024: Impression:  - Normal esophagus.  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 Treated with argon plasma coagulation ( APC) .  - Hematin ( altered blood/ coffee- ground- like material) in the entire stomach.  - Normal examined duodenum.  - No specimens collected.  RADIOGRAPHIC STUDIES: I have personally reviewed the radiological images as listed and agreed with the findings in the report.  CLINICAL DATA:  Lung nodule, > 8mm Follow-up on abnormal CT from 09/11/2023 - prior CT showed scattered spiculated nodules concerning for infection versus neoplasm. Patient was treated for pneumonia, need to ensure resolution of abnormalities.   EXAM: CT CHEST WITHOUT CONTRAST   TECHNIQUE: Multidetector CT imaging of the chest was performed following the standard protocol without IV contrast.   RADIATION DOSE REDUCTION: This exam was performed according to the departmental dose-optimization program which includes automated exposure control, adjustment of the mA and/or kV according to patient size and/or use of  iterative reconstruction technique.   COMPARISON:  Chest CT 09/11/2023, lung cancer screening CT 04/24/2023   FINDINGS: Cardiovascular: The heart is normal in size. No pericardial effusion. There are coronary artery calcifications. Atherosclerosis of the thoracic aorta without aneurysm.   Mediastinum/Nodes: No enlarged  mediastinal lymph nodes. Limited hilar assessment in the absence of IV contrast. Improved esophageal wall thickening from prior exam. No thyroid nodule.   Lungs/Pleura: Minimal left pleural effusion is unchanged from prior exam. Moderate emphysema. The majority is the spiculated pulmonary nodules on prior exam have resolved. For example the previous largest right middle lobe nodule that had measured 8 mm has diminished, currently measuring 4 mm series 3, image 87. Additional pulmonary nodules are unchanged from prior lung cancer screening CT. This includes a dominant nodule in the right upper lobe along the fissure measuring 6.5 mm series 3, image 89. There are no enlarging pulmonary nodules. No acute airspace disease. Mild chronic elevation of left hemidiaphragm.   Upper Abdomen: Cholecystectomy.  No acute upper abdominal findings.   Musculoskeletal: There are no acute or suspicious osseous abnormalities.   IMPRESSION: 1. The majority of the spiculated pulmonary nodules on prior exam have resolved, consistent with infectious/inflammatory etiology. 2. Additional pulmonary nodules are unchanged from prior lung cancer screening CT. Recommend continued annual lung cancer screening with low-dose chest CT. 3. Improved esophageal wall thickening from prior exam.   Aortic Atherosclerosis (ICD10-I70.0) and Emphysema (ICD10-J43.9).     Electronically Signed   By: Andrea Gasman M.D.   On: 12/19/2023 21:36

## 2024-07-15 NOTE — Assessment & Plan Note (Addendum)
 Patient has a history of iron  deficiency anemia secondary to GI blood loss Recent endoscopy with angiodysplasia s/p APC Labs reviewed today consistent with severe iron  deficiency  -We discussed some of the risks, benefits, and alternatives of intravenous iron  infusions. The patient is symptomatic from anemia and the iron  level is critically low. He tolerated oral iron  supplement poorly and desires to achieve higher levels of iron  faster for adequate hematopoesis. Some of the side-effects to be expected including risks of infusion reactions, phlebitis, headaches, nausea and fatigue.  The patient is willing to proceed. Patient education material was dispensed. Goal is to keep ferritin level greater than 50 and resolution of anemia - Start oral iron  every other day.  Use MiraLAX  for constipation  Return to clinic in 8 weeks with labs to assess response to IV iron 

## 2024-07-15 NOTE — Assessment & Plan Note (Addendum)
 Patient reports smoking since he was 78 years old.  Quit smoking after hospitalization in October 2024.

## 2024-07-15 NOTE — Assessment & Plan Note (Addendum)
-  TURBT for T2 a N0 urothelial cancer on 11/19/2016. As there was suspicion for muscle invasion, underwent cisplatin  and gemcitabine  4 cycles from 12/30/2016 through 04/21/2017 followed by cystoprostatectomy and ileal conduit on 07/30/2017, showing residual disease of 0.4 cm. Follows with Dr. Alvaro and is getting frequent CT scans.  Recent one per patient was last month  -Continue to follow with urology

## 2024-07-15 NOTE — Assessment & Plan Note (Addendum)
 Patient has stable chronic kidney disease  -Continue to follow with Dr. Rachele -Would have a TSAT goal of 25-30 for IV iron  as long as ferritin is less than 400 -If iron  levels are normal and anemia persists, will consider multiple myeloma workup and starting EPO shots if hemoglobin is less than 10

## 2024-07-20 ENCOUNTER — Inpatient Hospital Stay

## 2024-07-20 VITALS — BP 95/44 | HR 63 | Temp 97.9°F | Resp 18 | Wt 166.0 lb

## 2024-07-20 DIAGNOSIS — D500 Iron deficiency anemia secondary to blood loss (chronic): Secondary | ICD-10-CM

## 2024-07-20 DIAGNOSIS — D631 Anemia in chronic kidney disease: Secondary | ICD-10-CM

## 2024-07-20 DIAGNOSIS — N184 Chronic kidney disease, stage 4 (severe): Secondary | ICD-10-CM

## 2024-07-20 DIAGNOSIS — D649 Anemia, unspecified: Secondary | ICD-10-CM

## 2024-07-20 MED ORDER — ACETAMINOPHEN 325 MG PO TABS
650.0000 mg | ORAL_TABLET | Freq: Once | ORAL | Status: AC
  Administered 2024-07-20 (×2): 650 mg via ORAL
  Filled 2024-07-20: qty 2

## 2024-07-20 MED ORDER — CETIRIZINE HCL 10 MG PO TABS
10.0000 mg | ORAL_TABLET | Freq: Once | ORAL | Status: AC
  Administered 2024-07-20 (×2): 10 mg via ORAL
  Filled 2024-07-20: qty 1

## 2024-07-20 MED ORDER — SODIUM CHLORIDE 0.9 % IV SOLN: 400.0000 mg | Freq: Once | INTRAVENOUS | Status: DC

## 2024-07-20 MED ORDER — IRON SUCROSE 500 MG IVPB - SIMPLE MED
500.0000 mg | Freq: Once | INTRAVENOUS | Status: AC
  Administered 2024-07-20 (×2): 500 mg via INTRAVENOUS
  Filled 2024-07-20: qty 500

## 2024-07-20 MED ORDER — SODIUM CHLORIDE 0.9 % IV SOLN: INTRAVENOUS | Status: DC

## 2024-07-20 NOTE — Patient Instructions (Signed)
 [facility] - A DEPT OF [facility]  Discharge Instructions: Thank you for choosing [facility] to provide your oncology and hematology care.  If you have a lab appointment with the Cancer Center - please note that after April 8th, 2024, all labs will be drawn in the cancer center.  You do not have to check in or register with the main entrance as you have in the past but will complete your check-in in the cancer center.  Wear comfortable clothing and clothing appropriate for easy access to any Portacath or PICC line.   We strive to give you quality time with your provider. You may need to reschedule your appointment if you arrive late (15 or more minutes).  Arriving late affects you and other patients whose appointments are after yours.  Also, if you miss three or more appointments without notifying the office, you may be dismissed from the clinic at the provider's discretion.      For prescription refill requests, have your pharmacy contact our office and allow 72 hours for refills to be completed.    Today you received Venofer  500 mg IV iron  infusion     BELOW ARE SYMPTOMS THAT SHOULD BE REPORTED IMMEDIATELY: *FEVER GREATER THAN 100.4 F (38 C) OR HIGHER *CHILLS OR SWEATING *NAUSEA AND VOMITING THAT IS NOT CONTROLLED WITH YOUR NAUSEA MEDICATION *UNUSUAL SHORTNESS OF BREATH *UNUSUAL BRUISING OR BLEEDING *URINARY PROBLEMS (pain or burning when urinating, or frequent urination) *BOWEL PROBLEMS (unusual diarrhea, constipation, pain near the anus) TENDERNESS IN MOUTH AND THROAT WITH OR WITHOUT PRESENCE OF ULCERS (sore throat, sores in mouth, or a toothache) UNUSUAL RASH, SWELLING OR PAIN  UNUSUAL VAGINAL DISCHARGE OR ITCHING   Items with * indicate a potential emergency and should be followed up as soon as possible or go to the Emergency Department if any problems should occur.  Please show the CHEMOTHERAPY ALERT CARD or IMMUNOTHERAPY ALERT  CARD at check-in to the Emergency Department and triage nurse.  Should you have questions after your visit or need to cancel or reschedule your appointment, please contact [facility] [place] - A DEPT OF [facility] [phone]  and follow the prompts.  Office hours are 8:00 a.m. to 4:30 p.m. Monday - Friday. Please note that voicemails left after 4:00 p.m. may not be returned until the following business day.  We are closed weekends and major holidays. You have access to a nurse at all times for urgent questions. Please call the main number to the clinic [phone] and follow the prompts.  For any non-urgent questions, you may also contact your provider using MyChart. We now offer e-Visits for anyone 58 and older to request care online for non-urgent symptoms. For details visit [email or web address].   Also download the MyChart app! Go to the app store, search "MyChart", open the app, select [place], and log in with your MyChart username and password.

## 2024-07-20 NOTE — Progress Notes (Signed)
Patient presents today for iron infusion. Patient is in satisfactory condition with no new complaints voiced.  Vital signs are stable.  We will proceed with infusion per provider orders.    Peripheral IV started with good blood return pre and post infusion.  Venofer 500 mg  given today per MD orders. Tolerated infusion without adverse affects. Vital signs stable. No complaints at this time. Discharged from clinic via wheelchair in stable condition. Alert and oriented x 3. F/U with [facility] as scheduled.

## 2024-07-23 ENCOUNTER — Other Ambulatory Visit (INDEPENDENT_AMBULATORY_CARE_PROVIDER_SITE_OTHER): Payer: Self-pay | Admitting: Vascular Surgery

## 2024-07-23 DIAGNOSIS — Z9889 Other specified postprocedural states: Secondary | ICD-10-CM

## 2024-07-26 ENCOUNTER — Ambulatory Visit (INDEPENDENT_AMBULATORY_CARE_PROVIDER_SITE_OTHER): Admitting: Vascular Surgery

## 2024-07-26 ENCOUNTER — Encounter (INDEPENDENT_AMBULATORY_CARE_PROVIDER_SITE_OTHER): Payer: Self-pay | Admitting: Vascular Surgery

## 2024-07-26 ENCOUNTER — Other Ambulatory Visit (INDEPENDENT_AMBULATORY_CARE_PROVIDER_SITE_OTHER)

## 2024-07-26 VITALS — BP 117/68 | HR 81 | Ht 70.0 in | Wt 160.0 lb

## 2024-07-26 DIAGNOSIS — Z9889 Other specified postprocedural states: Secondary | ICD-10-CM | POA: Diagnosis not present

## 2024-07-26 DIAGNOSIS — I739 Peripheral vascular disease, unspecified: Secondary | ICD-10-CM

## 2024-07-26 DIAGNOSIS — I70213 Atherosclerosis of native arteries of extremities with intermittent claudication, bilateral legs: Secondary | ICD-10-CM

## 2024-07-26 LAB — VAS US ABI WITH/WO TBI
Left ABI: 0.93
Right ABI: 1.13
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
## 2024-07-28 ENCOUNTER — Inpatient Hospital Stay

## 2024-07-28 VITALS — BP 123/66 | HR 70 | Temp 97.5°F | Resp 16

## 2024-07-28 DIAGNOSIS — D500 Iron deficiency anemia secondary to blood loss (chronic): Secondary | ICD-10-CM | POA: Diagnosis not present

## 2024-07-28 DIAGNOSIS — D649 Anemia, unspecified: Secondary | ICD-10-CM

## 2024-07-28 DIAGNOSIS — D631 Anemia in chronic kidney disease: Secondary | ICD-10-CM

## 2024-07-28 MED ORDER — CETIRIZINE HCL 10 MG PO TABS
10.0000 mg | ORAL_TABLET | Freq: Once | ORAL | Status: AC
  Administered 2024-07-28: 10 mg via ORAL
  Filled 2024-07-28: qty 1

## 2024-07-28 MED ORDER — IRON SUCROSE 500 MG IVPB - SIMPLE MED
500.0000 mg | Freq: Once | INTRAVENOUS | Status: AC
  Administered 2024-07-28: 500 mg via INTRAVENOUS
  Filled 2024-07-28: qty 100

## 2024-07-28 MED ORDER — SODIUM CHLORIDE 0.9 % IV SOLN: INTRAVENOUS | Status: DC

## 2024-07-28 MED ORDER — ACETAMINOPHEN 325 MG PO TABS
650.0000 mg | ORAL_TABLET | Freq: Once | ORAL | Status: AC
  Administered 2024-07-28: 650 mg via ORAL
  Filled 2024-07-28: qty 2

## 2024-07-28 NOTE — Patient Instructions (Signed)
 [facility] [place] - A DEPT OF [facility]  Discharge Instructions: Thank you for choosing [facility] to provide your oncology and hematology care.  If you have a lab appointment with the Cancer Center - please note that after April 8th, 2024, all labs will be drawn in the cancer center.  You do not have to check in or register with the main entrance as you have in the past but will complete your check-in in the cancer center.  Wear comfortable clothing and clothing appropriate for easy access to any Portacath or PICC line.   We strive to give you quality time with your provider. You may need to reschedule your appointment if you arrive late (15 or more minutes).  Arriving late affects you and other patients whose appointments are after yours.  Also, if you miss three or more appointments without notifying the office, you may be dismissed from the clinic at the provider's discretion.      For prescription refill requests, have your pharmacy contact our office and allow 72 hours for refills to be completed.    Today you received the following chemotherapy and/or immunotherapy agents Venofer. Iron Sucrose Injection What is this medication? IRON SUCROSE (EYE ern SOO krose) treats low levels of iron (iron deficiency anemia) in people with kidney disease. Iron is a mineral that plays an important role in making red blood cells, which carry oxygen from your lungs to the rest of your body. This medicine may be used for other purposes; ask your health care provider or pharmacist if you have questions. COMMON BRAND NAME(S): Venofer What should I tell my care team before I take this medication? They need to know if you have any of these conditions: Anemia not caused by low iron levels Heart disease High levels of iron in the blood Kidney disease Liver disease An unusual or allergic reaction to iron, other medications, foods, dyes, or preservatives Pregnant or  trying to get pregnant Breastfeeding How should I use this medication? This medication is infused into a vein. It is given by your care team in a hospital or clinic setting. Talk to your care team about the use of this medication in children. While it may be prescribed for children as young as 2 years for selected conditions, precautions do apply. Overdosage: If you think you have taken too much of this medicine contact a poison control center or emergency room at once. NOTE: This medicine is only for you. Do not share this medicine with others. What if I miss a dose? Keep appointments for follow-up doses. It is important not to miss your dose. Call your care team if you are unable to keep an appointment. What may interact with this medication? Do not take this medication with any of the following: Deferoxamine Dimercaprol Other iron products This medication may also interact with the following: Chloramphenicol Deferasirox This list may not describe all possible interactions. Give your health care provider a list of all the medicines, herbs, non-prescription drugs, or dietary supplements you use. Also tell them if you smoke, drink alcohol, or use illegal drugs. Some items may interact with your medicine. What should I watch for while using this medication? Visit your care team for regular checks on your progress. Tell your care team if your symptoms do not start to get better or if they get worse. You may need blood work done while you are taking this medication. You may need to eat  more foods that contain iron. Talk to your care team. Foods that contain iron include whole grains or cereals, dried fruits, beans, peas, leafy green vegetables, and organ meats (liver, kidney). What side effects may I notice from receiving this medication? Side effects that you should report to your care team as soon as possible: Allergic reactions--skin rash, itching, hives, swelling of the face, lips, tongue,  or throat Low blood pressure--dizziness, feeling faint or lightheaded, blurry vision Shortness of breath Side effects that usually do not require medical attention (report to your care team if they continue or are bothersome): Flushing Headache Joint pain Muscle pain Nausea Pain, redness, or irritation at injection site This list may not describe all possible side effects. Call your doctor for medical advice about side effects. You may report side effects to FDA at 1-800-FDA-1088. Where should I keep my medication? This medication is given in a hospital or clinic. It will not be stored at home. NOTE: This sheet is a summary. It may not cover all possible information. If you have questions about this medicine, talk to your doctor, pharmacist, or health care provider.  2024 Elsevier/Gold Standard (2023-07-16 00:00:00)      To help prevent nausea and vomiting after your treatment, we encourage you to take your nausea medication as directed.  BELOW ARE SYMPTOMS THAT SHOULD BE REPORTED IMMEDIATELY: *FEVER GREATER THAN 100.4 F (38 C) OR HIGHER *CHILLS OR SWEATING *NAUSEA AND VOMITING THAT IS NOT CONTROLLED WITH YOUR NAUSEA MEDICATION *UNUSUAL SHORTNESS OF BREATH *UNUSUAL BRUISING OR BLEEDING *URINARY PROBLEMS (pain or burning when urinating, or frequent urination) *BOWEL PROBLEMS (unusual diarrhea, constipation, pain near the anus) TENDERNESS IN MOUTH AND THROAT WITH OR WITHOUT PRESENCE OF ULCERS (sore throat, sores in mouth, or a toothache) UNUSUAL RASH, SWELLING OR PAIN  UNUSUAL VAGINAL DISCHARGE OR ITCHING   Items with * indicate a potential emergency and should be followed up as soon as possible or go to the Emergency Department if any problems should occur.  Please show the CHEMOTHERAPY ALERT CARD or IMMUNOTHERAPY ALERT CARD at check-in to the Emergency Department and triage nurse.  Should you have questions after your visit or need to cancel or reschedule your appointment, please  contact [facility] [place] - A DEPT OF [facility] [phone]  and follow the prompts.  Office hours are 8:00 a.m. to 4:30 p.m. Monday - Friday. Please note that voicemails left after 4:00 p.m. may not be returned until the following business day.  We are closed weekends and major holidays. You have access to a nurse at all times for urgent questions. Please call the main number to the clinic [phone] and follow the prompts.  For any non-urgent questions, you may also contact your provider using MyChart. We now offer e-Visits for anyone 70 and older to request care online for non-urgent symptoms. For details visit [email or web address].   Also download the MyChart app! Go to the app store, search "MyChart", open the app, select [place], and log in with your MyChart username and password.

## 2024-07-28 NOTE — Progress Notes (Signed)
 Patient presents today for iron  infusion of Venofer .  Patient is in satisfactory condition with no new complaints voiced.  Vital signs are stable.  We will proceed with infusion per provider orders.    Patient tolerated treatment well with no complaints voiced. Patient refused to wait 30 minute post infusion wait. Patient aware of possible reactions and concerns to leaving prior to post wait, verbalized understanding. Patient left ambulatory in stable condition.  Vital signs stable at discharge.  Follow up as scheduled.

## 2024-08-11 ENCOUNTER — Other Ambulatory Visit (INDEPENDENT_AMBULATORY_CARE_PROVIDER_SITE_OTHER): Payer: Self-pay

## 2024-08-11 DIAGNOSIS — R634 Abnormal weight loss: Secondary | ICD-10-CM

## 2024-08-11 DIAGNOSIS — R932 Abnormal findings on diagnostic imaging of liver and biliary tract: Secondary | ICD-10-CM

## 2024-08-11 DIAGNOSIS — K7460 Unspecified cirrhosis of liver: Secondary | ICD-10-CM

## 2024-08-11 DIAGNOSIS — K922 Gastrointestinal hemorrhage, unspecified: Secondary | ICD-10-CM

## 2024-08-11 DIAGNOSIS — K769 Liver disease, unspecified: Secondary | ICD-10-CM

## 2024-08-11 DIAGNOSIS — D649 Anemia, unspecified: Secondary | ICD-10-CM

## 2024-08-11 DIAGNOSIS — Z79899 Other long term (current) drug therapy: Secondary | ICD-10-CM

## 2024-08-11 DIAGNOSIS — K31819 Angiodysplasia of stomach and duodenum without bleeding: Secondary | ICD-10-CM

## 2024-08-11 DIAGNOSIS — K921 Melena: Secondary | ICD-10-CM

## 2024-09-07 ENCOUNTER — Ambulatory Visit (INDEPENDENT_AMBULATORY_CARE_PROVIDER_SITE_OTHER): Admitting: Gastroenterology

## 2024-09-07 ENCOUNTER — Other Ambulatory Visit (INDEPENDENT_AMBULATORY_CARE_PROVIDER_SITE_OTHER): Payer: Self-pay | Admitting: *Deleted

## 2024-09-07 ENCOUNTER — Telehealth (INDEPENDENT_AMBULATORY_CARE_PROVIDER_SITE_OTHER): Payer: Self-pay

## 2024-09-07 VITALS — BP 94/52 | HR 73 | Temp 97.6°F | Ht 70.5 in | Wt 161.2 lb

## 2024-09-07 DIAGNOSIS — K7460 Unspecified cirrhosis of liver: Secondary | ICD-10-CM

## 2024-09-07 DIAGNOSIS — Z860101 Personal history of adenomatous and serrated colon polyps: Secondary | ICD-10-CM | POA: Diagnosis not present

## 2024-09-07 DIAGNOSIS — D509 Iron deficiency anemia, unspecified: Secondary | ICD-10-CM | POA: Diagnosis not present

## 2024-09-07 DIAGNOSIS — D500 Iron deficiency anemia secondary to blood loss (chronic): Secondary | ICD-10-CM

## 2024-09-07 NOTE — Patient Instructions (Signed)
-  we will update US  of the liver and labs -continue to follow with hematology -continue iron  infusion and PO iron  every other day as directed by hematology -continue metamucil -Increase water  intake, aim for atleast 64 oz per day -Increase fruits, veggies and whole grains, kiwi and prunes are especially good for constipation -Reduce salt intake to <2 g per day - Can take Tylenol  max of 2 g per day (650 mg q8h) for pain - Avoid NSAIDs for pain - Avoid eating raw oysters/shellfish - Ensure protein shake every night before going to sleep -please discuss Hepatitis A and B vaccines with your PCP as most recent labs showed you do not have immunity to these viruses  Follow up 6 months

## 2024-09-07 NOTE — Progress Notes (Signed)
 error
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
## 2024-09-07 NOTE — Telephone Encounter (Signed)
 Spoke with patient, made him aware of his ultrasound appointment on 09/14/2024 at 10:00 am. Patient aware and verbalized understanding.

## 2024-09-14 ENCOUNTER — Ambulatory Visit (INDEPENDENT_AMBULATORY_CARE_PROVIDER_SITE_OTHER): Payer: Self-pay | Admitting: Gastroenterology

## 2024-09-14 ENCOUNTER — Ambulatory Visit (HOSPITAL_COMMUNITY)
Admission: RE | Admit: 2024-09-14 | Discharge: 2024-09-14 | Disposition: A | Discharge status: Home / Self Care | Source: Ambulatory Visit | Attending: Gastroenterology | Admitting: Gastroenterology

## 2024-09-14 DIAGNOSIS — K7460 Unspecified cirrhosis of liver: Secondary | ICD-10-CM | POA: Insufficient documentation

## 2024-09-15 LAB — COMPREHENSIVE METABOLIC PANEL WITH GFR
ALT: 5 IU/L (ref 0–44)
AST: 12 IU/L (ref 0–40)
Albumin: 4.1 g/dL (ref 3.8–4.8)
Alkaline Phosphatase: 134 IU/L — ABNORMAL HIGH (ref 47–123)
BUN/Creatinine Ratio: 12 (ref 10–24)
BUN: 27 mg/dL (ref 8–27)
Bilirubin Total: <0.2 mg/dL (ref 0.0–1.2)
CO2: 17 mmol/L — ABNORMAL LOW (ref 20–29)
Calcium: 9.3 mg/dL (ref 8.6–10.2)
Chloride: 110 mmol/L — ABNORMAL HIGH (ref 96–106)
Creatinine, Ser: 2.26 mg/dL — ABNORMAL HIGH (ref 0.76–1.27)
Globulin, Total: 2.6 g/dL (ref 1.5–4.5)
Glucose: 99 mg/dL (ref 70–99)
Potassium: 4.5 mmol/L (ref 3.5–5.2)
Sodium: 144 mmol/L (ref 134–144)
Total Protein: 6.7 g/dL (ref 6.0–8.5)
eGFR: 29 mL/min/1.73 — ABNORMAL LOW (ref >59)

## 2024-09-15 LAB — PROTIME-INR
INR: 1 (ref 0.9–1.2)
Prothrombin Time: 10.4 s (ref 9.1–12.0)

## 2024-09-15 LAB — AFP TUMOR MARKER: AFP, Serum, Tumor Marker: <1.8 ng/mL (ref 0.0–8.4)

## 2024-09-15 LAB — CBC
Hematocrit: 31.3 % — ABNORMAL LOW (ref 37.5–51.0)
Hemoglobin: 9.6 g/dL — ABNORMAL LOW (ref 13.0–17.7)
MCH: 27.4 pg (ref 26.6–33.0)
MCHC: 30.7 g/dL — ABNORMAL LOW (ref 31.5–35.7)
MCV: 89 fL (ref 79–97)
Platelets: 372 x10E3/uL (ref 150–450)
RBC: 3.5 x10E6/uL — ABNORMAL LOW (ref 4.14–5.80)
RDW: 13.6 % (ref 11.6–15.4)
WBC: 8.3 x10E3/uL (ref 3.4–10.8)

## 2024-09-15 NOTE — Progress Notes (Signed)
 6 mth US  noted in recall

## 2024-09-16 ENCOUNTER — Encounter (INDEPENDENT_AMBULATORY_CARE_PROVIDER_SITE_OTHER): Payer: Self-pay

## 2024-09-17 ENCOUNTER — Inpatient Hospital Stay: Attending: Physician Assistant

## 2024-09-17 DIAGNOSIS — Z833 Family history of diabetes mellitus: Secondary | ICD-10-CM | POA: Insufficient documentation

## 2024-09-17 DIAGNOSIS — N184 Chronic kidney disease, stage 4 (severe): Secondary | ICD-10-CM | POA: Insufficient documentation

## 2024-09-17 DIAGNOSIS — Z8551 Personal history of malignant neoplasm of bladder: Secondary | ICD-10-CM | POA: Insufficient documentation

## 2024-09-17 DIAGNOSIS — R0602 Shortness of breath: Secondary | ICD-10-CM | POA: Insufficient documentation

## 2024-09-17 DIAGNOSIS — Z7902 Long term (current) use of antithrombotics/antiplatelets: Secondary | ICD-10-CM | POA: Insufficient documentation

## 2024-09-17 DIAGNOSIS — D500 Iron deficiency anemia secondary to blood loss (chronic): Secondary | ICD-10-CM | POA: Insufficient documentation

## 2024-09-17 DIAGNOSIS — Z881 Allergy status to other antibiotic agents status: Secondary | ICD-10-CM | POA: Insufficient documentation

## 2024-09-17 DIAGNOSIS — Z7982 Long term (current) use of aspirin: Secondary | ICD-10-CM | POA: Insufficient documentation

## 2024-09-17 DIAGNOSIS — R42 Dizziness and giddiness: Secondary | ICD-10-CM | POA: Insufficient documentation

## 2024-09-17 DIAGNOSIS — Z79899 Other long term (current) drug therapy: Secondary | ICD-10-CM | POA: Insufficient documentation

## 2024-09-17 DIAGNOSIS — Z8701 Personal history of pneumonia (recurrent): Secondary | ICD-10-CM | POA: Insufficient documentation

## 2024-09-17 DIAGNOSIS — Z87891 Personal history of nicotine dependence: Secondary | ICD-10-CM | POA: Insufficient documentation

## 2024-09-17 DIAGNOSIS — K922 Gastrointestinal hemorrhage, unspecified: Secondary | ICD-10-CM | POA: Insufficient documentation

## 2024-09-17 DIAGNOSIS — Z8249 Family history of ischemic heart disease and other diseases of the circulatory system: Secondary | ICD-10-CM | POA: Insufficient documentation

## 2024-09-17 DIAGNOSIS — Z860100 Personal history of colon polyps, unspecified: Secondary | ICD-10-CM | POA: Insufficient documentation

## 2024-09-17 DIAGNOSIS — Z880 Allergy status to penicillin: Secondary | ICD-10-CM | POA: Insufficient documentation

## 2024-09-17 DIAGNOSIS — K5900 Constipation, unspecified: Secondary | ICD-10-CM | POA: Insufficient documentation

## 2024-09-17 DIAGNOSIS — I2510 Atherosclerotic heart disease of native coronary artery without angina pectoris: Secondary | ICD-10-CM | POA: Insufficient documentation

## 2024-09-17 DIAGNOSIS — J449 Chronic obstructive pulmonary disease, unspecified: Secondary | ICD-10-CM | POA: Insufficient documentation

## 2024-09-17 DIAGNOSIS — Z800 Family history of malignant neoplasm of digestive organs: Secondary | ICD-10-CM | POA: Insufficient documentation

## 2024-09-17 DIAGNOSIS — D631 Anemia in chronic kidney disease: Secondary | ICD-10-CM | POA: Insufficient documentation

## 2024-09-17 DIAGNOSIS — Z87442 Personal history of urinary calculi: Secondary | ICD-10-CM | POA: Insufficient documentation

## 2024-09-17 DIAGNOSIS — Z9079 Acquired absence of other genital organ(s): Secondary | ICD-10-CM | POA: Insufficient documentation

## 2024-09-17 DIAGNOSIS — Z888 Allergy status to other drugs, medicaments and biological substances status: Secondary | ICD-10-CM | POA: Insufficient documentation

## 2024-09-17 DIAGNOSIS — Z59869 Financial insecurity, unspecified: Secondary | ICD-10-CM | POA: Insufficient documentation

## 2024-09-22 ENCOUNTER — Inpatient Hospital Stay

## 2024-09-22 DIAGNOSIS — Z833 Family history of diabetes mellitus: Secondary | ICD-10-CM | POA: Diagnosis not present

## 2024-09-22 DIAGNOSIS — Z9079 Acquired absence of other genital organ(s): Secondary | ICD-10-CM | POA: Diagnosis not present

## 2024-09-22 DIAGNOSIS — I2510 Atherosclerotic heart disease of native coronary artery without angina pectoris: Secondary | ICD-10-CM | POA: Diagnosis not present

## 2024-09-22 DIAGNOSIS — D631 Anemia in chronic kidney disease: Secondary | ICD-10-CM | POA: Diagnosis present

## 2024-09-22 DIAGNOSIS — N184 Chronic kidney disease, stage 4 (severe): Secondary | ICD-10-CM | POA: Diagnosis not present

## 2024-09-22 DIAGNOSIS — R42 Dizziness and giddiness: Secondary | ICD-10-CM | POA: Diagnosis not present

## 2024-09-22 DIAGNOSIS — Z7982 Long term (current) use of aspirin: Secondary | ICD-10-CM | POA: Diagnosis not present

## 2024-09-22 DIAGNOSIS — K5900 Constipation, unspecified: Secondary | ICD-10-CM | POA: Diagnosis not present

## 2024-09-22 DIAGNOSIS — Z881 Allergy status to other antibiotic agents status: Secondary | ICD-10-CM | POA: Diagnosis not present

## 2024-09-22 DIAGNOSIS — Z860100 Personal history of colon polyps, unspecified: Secondary | ICD-10-CM | POA: Diagnosis not present

## 2024-09-22 DIAGNOSIS — Z79899 Other long term (current) drug therapy: Secondary | ICD-10-CM | POA: Diagnosis not present

## 2024-09-22 DIAGNOSIS — J449 Chronic obstructive pulmonary disease, unspecified: Secondary | ICD-10-CM | POA: Diagnosis not present

## 2024-09-22 DIAGNOSIS — R0602 Shortness of breath: Secondary | ICD-10-CM | POA: Diagnosis not present

## 2024-09-22 DIAGNOSIS — Z59869 Financial insecurity, unspecified: Secondary | ICD-10-CM | POA: Diagnosis not present

## 2024-09-22 DIAGNOSIS — D500 Iron deficiency anemia secondary to blood loss (chronic): Secondary | ICD-10-CM

## 2024-09-22 DIAGNOSIS — Z8551 Personal history of malignant neoplasm of bladder: Secondary | ICD-10-CM | POA: Diagnosis not present

## 2024-09-22 DIAGNOSIS — Z8701 Personal history of pneumonia (recurrent): Secondary | ICD-10-CM | POA: Diagnosis not present

## 2024-09-22 DIAGNOSIS — K922 Gastrointestinal hemorrhage, unspecified: Secondary | ICD-10-CM | POA: Diagnosis not present

## 2024-09-22 DIAGNOSIS — Z87891 Personal history of nicotine dependence: Secondary | ICD-10-CM | POA: Diagnosis not present

## 2024-09-22 DIAGNOSIS — Z87442 Personal history of urinary calculi: Secondary | ICD-10-CM | POA: Diagnosis not present

## 2024-09-22 DIAGNOSIS — Z800 Family history of malignant neoplasm of digestive organs: Secondary | ICD-10-CM | POA: Diagnosis not present

## 2024-09-22 DIAGNOSIS — Z7902 Long term (current) use of antithrombotics/antiplatelets: Secondary | ICD-10-CM | POA: Diagnosis not present

## 2024-09-22 DIAGNOSIS — Z880 Allergy status to penicillin: Secondary | ICD-10-CM | POA: Diagnosis not present

## 2024-09-22 DIAGNOSIS — Z888 Allergy status to other drugs, medicaments and biological substances status: Secondary | ICD-10-CM | POA: Diagnosis not present

## 2024-09-22 LAB — IRON AND TIBC
Iron: 21 ug/dL — ABNORMAL LOW (ref 45–182)
Saturation Ratios: 9 % — ABNORMAL LOW (ref 17.9–39.5)
TIBC: 239 ug/dL — ABNORMAL LOW (ref 250–450)
UIBC: 218 ug/dL

## 2024-09-22 LAB — CBC WITH DIFFERENTIAL/PLATELET
Abs Immature Granulocytes: 0.04 K/uL (ref 0.00–0.07)
Basophils Absolute: 0 K/uL (ref 0.0–0.1)
Basophils Relative: 0 %
Eosinophils Absolute: 0.4 K/uL (ref 0.0–0.5)
Eosinophils Relative: 4 %
HCT: 28.6 % — ABNORMAL LOW (ref 39.0–52.0)
Hemoglobin: 8.5 g/dL — ABNORMAL LOW (ref 13.0–17.0)
Immature Granulocytes: 1 %
Lymphocytes Relative: 18 %
Lymphs Abs: 1.6 K/uL (ref 0.7–4.0)
MCH: 27.4 pg (ref 26.0–34.0)
MCHC: 29.7 g/dL — ABNORMAL LOW (ref 30.0–36.0)
MCV: 92.3 fL (ref 80.0–100.0)
Monocytes Absolute: 0.6 K/uL (ref 0.1–1.0)
Monocytes Relative: 7 %
Neutro Abs: 6.2 K/uL (ref 1.7–7.7)
Neutrophils Relative %: 70 %
Platelets: 342 K/uL (ref 150–400)
RBC: 3.1 MIL/uL — ABNORMAL LOW (ref 4.22–5.81)
RDW: 14.2 % (ref 11.5–15.5)
WBC: 8.8 K/uL (ref 4.0–10.5)
nRBC: 0 % (ref 0.0–0.2)

## 2024-09-22 LAB — COMPREHENSIVE METABOLIC PANEL WITH GFR
ALT: <5 U/L (ref 0–44)
AST: 12 U/L — ABNORMAL LOW (ref 15–41)
Albumin: 3.8 g/dL (ref 3.5–5.0)
Alkaline Phosphatase: 103 U/L (ref 38–126)
Anion gap: 12 (ref 5–15)
BUN: 30 mg/dL — ABNORMAL HIGH (ref 8–23)
CO2: 24 mmol/L (ref 22–32)
Calcium: 8.9 mg/dL (ref 8.9–10.3)
Chloride: 108 mmol/L (ref 98–111)
Creatinine, Ser: 2.33 mg/dL — ABNORMAL HIGH (ref 0.61–1.24)
GFR, Estimated: 28 mL/min — ABNORMAL LOW (ref >60)
Glucose, Bld: 147 mg/dL — ABNORMAL HIGH (ref 70–99)
Potassium: 3.9 mmol/L (ref 3.5–5.1)
Sodium: 143 mmol/L (ref 135–145)
Total Bilirubin: <0.2 mg/dL (ref 0.0–1.2)
Total Protein: 6.7 g/dL (ref 6.5–8.1)

## 2024-09-22 LAB — FERRITIN: Ferritin: 133 ng/mL (ref 24–336)

## 2024-09-22 LAB — VITAMIN B12: Vitamin B-12: 325 pg/mL (ref 180–914)

## 2024-09-22 LAB — FOLATE: Folate: 10.5 ng/mL (ref >5.9)
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
## 2024-09-24 ENCOUNTER — Inpatient Hospital Stay (HOSPITAL_BASED_OUTPATIENT_CLINIC_OR_DEPARTMENT_OTHER): Admitting: Physician Assistant

## 2024-09-24 VITALS — BP 143/57 | HR 79 | Temp 98.0°F | Resp 19 | Wt 159.9 lb

## 2024-09-24 DIAGNOSIS — D500 Iron deficiency anemia secondary to blood loss (chronic): Secondary | ICD-10-CM | POA: Diagnosis not present

## 2024-09-24 DIAGNOSIS — Z720 Tobacco use: Secondary | ICD-10-CM

## 2024-09-24 DIAGNOSIS — N184 Chronic kidney disease, stage 4 (severe): Secondary | ICD-10-CM

## 2024-09-24 DIAGNOSIS — D631 Anemia in chronic kidney disease: Secondary | ICD-10-CM | POA: Diagnosis not present

## 2024-09-24 DIAGNOSIS — E538 Deficiency of other specified B group vitamins: Secondary | ICD-10-CM

## 2024-09-24 DIAGNOSIS — R918 Other nonspecific abnormal finding of lung field: Secondary | ICD-10-CM

## 2024-09-24 DIAGNOSIS — C679 Malignant neoplasm of bladder, unspecified: Secondary | ICD-10-CM | POA: Diagnosis not present

## 2024-09-24 MED ORDER — VITAMIN B-12 1000 MCG PO TABS: 1000.0000 ug | ORAL_TABLET | Freq: Every day | ORAL | 3 refills | Status: AC

## 2024-09-24 NOTE — Patient Instructions (Signed)
 [facility] at [facility] **VISIT SUMMARY & IMPORTANT INSTRUCTIONS **   You were seen today by Pleasant Barefoot [name] for your anemia and your history of bladder cancer.    ANEMIA: Your anemia is related to your iron  deficiency as well as your chronic kidney disease. Your iron  levels are low.  This is most likely from ongoing bleeding from your stomach. We will schedule you for IV iron  x 3 doses. Your vitamin B12 level is also low.  Prescription has been sent to your pharmacy for you to START taking VITAMIN B12 TABLET (1000 mcg) DAILY. We will check your blood count every 2 weeks to make sure it has not dropped low enough that you need a blood transfusion. You also have anemia related to your chronic kidney disease.  We will see you back in 6 to 8 weeks.  If you continue to have severe anemia despite improved iron  levels, we will start you on injections to help improve your blood count.    LUNG NODULES: We will check another CT scan of your chest in January 2026.  CHRONIC KIDNEY DISEASE: Continue follow-up with your kidney doctor (Dr. Rachele)  HISTORY OF BLADDER CANCER:  Continue follow-up with your urologist (Dr. Alvaro)  LIVER CIRRHOSIS: Continue follow-up with your gastroenterologist (NP Mitzie Boettcher).  FOLLOW-UP APPOINTMENT: Labs and office visit in 4 months  ** Thank you for trusting me with your healthcare!  I strive to provide all of my patients with quality care at each visit.  If you receive a survey for this visit, I would be so grateful to you for taking the time to provide feedback.  Thank you in advance!  ~ Precilla Purnell                                        Dr. Mickiel Davonna Pleasant Barefoot, [name]     Delon Hope, NP   - - - - - - - - - - - - - - - - - -     Thank you for choosing [facility] at [facility] to provide your oncology and hematology care.  To afford each patient quality time with our provider, please  arrive at least 15 minutes before your scheduled appointment time.   If you have a lab appointment with the Cancer Center please come in thru the Main Entrance and check in at the main information desk.  You need to re-schedule your appointment should you arrive 10 or more minutes late.  We strive to give you quality time with our providers, and arriving late affects you and other patients whose appointments are after yours.  Also, if you no show three or more times for appointments you may be dismissed from the clinic at the providers discretion.     Again, thank you for choosing [facility].  Our hope is that these requests will decrease the amount of time that you wait before being seen by our physicians.       _____________________________________________________________  Should you have questions after your visit to [facility], please contact our office at [phone] and follow the prompts.  Our office hours are 8:00 a.m. and 4:30 p.m. Monday - Friday.  Please note that voicemails left after 4:00 p.m. may not be returned until the following business day.  We are closed weekends and major holidays.  You do have access to a nurse 24-7, just call the main number to the clinic [phone] and do not press any options, hold on the line and a nurse will answer the phone.    For prescription refill requests, have your pharmacy contact our office and allow 72 hours.

## 2024-09-30 ENCOUNTER — Inpatient Hospital Stay

## 2024-09-30 VITALS — BP 108/50 | HR 66 | Temp 97.8°F | Resp 16

## 2024-09-30 DIAGNOSIS — D500 Iron deficiency anemia secondary to blood loss (chronic): Secondary | ICD-10-CM

## 2024-09-30 DIAGNOSIS — D631 Anemia in chronic kidney disease: Secondary | ICD-10-CM

## 2024-09-30 DIAGNOSIS — N184 Chronic kidney disease, stage 4 (severe): Secondary | ICD-10-CM | POA: Diagnosis not present

## 2024-09-30 DIAGNOSIS — D649 Anemia, unspecified: Secondary | ICD-10-CM

## 2024-09-30 MED ORDER — SODIUM CHLORIDE 0.9 % IV SOLN: INTRAVENOUS | Status: DC

## 2024-09-30 MED ORDER — IRON SUCROSE 300 MG IVPB - SIMPLE MED
300.0000 mg | Freq: Once | Status: AC
  Administered 2024-09-30: 300 mg via INTRAVENOUS
  Filled 2024-09-30: qty 200

## 2024-09-30 NOTE — Progress Notes (Signed)
 Patient presents today for iron  infusion.  Patient is in satisfactory condition with no new complaints voiced.  Vital signs are stable.  We will proceed with infusion per provider orders.    Patient took pre-meds at home prior to arrival  Venofer  300 mg  given today per MD orders. Tolerated infusion without adverse affects. Vital signs stable. No complaints at this time. Discharged from clinic ambulatory in stable condition. Alert and oriented x 3. F/U with [facility] as scheduled.

## 2024-09-30 NOTE — Progress Notes (Signed)
   09/30/24 1100  Spiritual Encounters  Type of Visit Initial  Care provided to: Patient;Friend (Friends is called Hoss)  Conversation partners present during Programmer, systems  Referral source Chaplain assessment (Chaplain rounding on the floor)  Reason for visit Routine spiritual support  OnCall Visit No  Spiritual Framework  Presenting Themes Caregiving needs;Impactful experiences and emotions  Values/beliefs Carrell enjoys hunting, espeically bird hunting, and fishing  Community/Connection Family;Friend(s)  Loss adjuster, chartered Deloyd presents with a relaxed demeanor indicating goos self-regulation skills  Needs/Challenges/Barriers Low iron  causing fatigue  Patient Stress Factors Not reviewed  Family Stress Factors Not reviewed  Interventions  Spiritual Care Interventions Made Established relationship of care and support;Reflective listening;Narrative/life review;Explored values/beliefs/practices/strengths  Intervention Outcomes  Outcomes Connection to spiritual care;Awareness of support  Spiritual Care Plan  Spiritual Care Issues Still Outstanding Chaplain will continue to follow   Reason for Visit: Chaplain identified Pt in the room as a Pt I had not connected with yet and visited to deliver introduction to Spiritual Care  Description of Visit: Upon arrival I found Jeffery Ponce seated in the recliner receiving treatment, and his friend, Hoss, was there as a support person.  I introduced myself as the chaplain for the cancer center and offered a brief education on the role of a chaplain and the support we can offer to our patients, caregivers, and staff.     Hoss shared with me that he enjoys looking out for Aloysuis and carrying him to his appointments.  I explored with Michaiah further to see who else was included in his support team- Sabre indicated his wife of 38 years was at home and cared for him.   Draken and Hoss shared of their times fishing and hunting.  It appears that this are important activities to  [place].   I was unable to get more information that would lead me to a fuller assessment, therefore I will continue to follow up with Pt to compete and assessment of what his spiritual, emotional, and relational resources and needs are.  Plan of Care: I will continue to follow up with Norleen on a monthly basis.   Maude Roll, MDiv  Chaplain, [facility] [email or web address] [phone]

## 2024-09-30 NOTE — Patient Instructions (Signed)
 [facility] - A DEPT OF [facility]  Discharge Instructions: Thank you for choosing [facility] to provide your oncology and hematology care.  If you have a lab appointment with the Cancer Center - please note that after April 8th, 2024, all labs will be drawn in the cancer center.  You do not have to check in or register with the main entrance as you have in the past but will complete your check-in in the cancer center.  Wear comfortable clothing and clothing appropriate for easy access to any Portacath or PICC line.   We strive to give you quality time with your provider. You may need to reschedule your appointment if you arrive late (15 or more minutes).  Arriving late affects you and other patients whose appointments are after yours.  Also, if you miss three or more appointments without notifying the office, you may be dismissed from the clinic at the provider's discretion.      For prescription refill requests, have your pharmacy contact our office and allow 72 hours for refills to be completed.    Today you received Venofer  300 mg IV iron  infusion.      BELOW ARE SYMPTOMS THAT SHOULD BE REPORTED IMMEDIATELY: *FEVER GREATER THAN 100.4 F (38 C) OR HIGHER *CHILLS OR SWEATING *NAUSEA AND VOMITING THAT IS NOT CONTROLLED WITH YOUR NAUSEA MEDICATION *UNUSUAL SHORTNESS OF BREATH *UNUSUAL BRUISING OR BLEEDING *URINARY PROBLEMS (pain or burning when urinating, or frequent urination) *BOWEL PROBLEMS (unusual diarrhea, constipation, pain near the anus) TENDERNESS IN MOUTH AND THROAT WITH OR WITHOUT PRESENCE OF ULCERS (sore throat, sores in mouth, or a toothache) UNUSUAL RASH, SWELLING OR PAIN  UNUSUAL VAGINAL DISCHARGE OR ITCHING   Items with * indicate a potential emergency and should be followed up as soon as possible or go to the Emergency Department if any problems should occur.  Please show the CHEMOTHERAPY ALERT CARD or IMMUNOTHERAPY ALERT  CARD at check-in to the Emergency Department and triage nurse.  Should you have questions after your visit or need to cancel or reschedule your appointment, please contact [facility] [place] - A DEPT OF [facility] [phone]  and follow the prompts.  Office hours are 8:00 a.m. to 4:30 p.m. Monday - Friday. Please note that voicemails left after 4:00 p.m. may not be returned until the following business day.  We are closed weekends and major holidays. You have access to a nurse at all times for urgent questions. Please call the main number to the clinic [phone] and follow the prompts.  For any non-urgent questions, you may also contact your provider using MyChart. We now offer e-Visits for anyone 50 and older to request care online for non-urgent symptoms. For details visit [email or web address].   Also download the MyChart app! Go to the app store, search MyChart, open the app, select [facility], and log in with your MyChart username and password.

## 2024-10-07 ENCOUNTER — Inpatient Hospital Stay

## 2024-10-07 VITALS — BP 99/45 | HR 66 | Temp 96.5°F | Resp 18

## 2024-10-07 DIAGNOSIS — N184 Chronic kidney disease, stage 4 (severe): Secondary | ICD-10-CM | POA: Diagnosis not present

## 2024-10-07 DIAGNOSIS — D631 Anemia in chronic kidney disease: Secondary | ICD-10-CM

## 2024-10-07 DIAGNOSIS — D500 Iron deficiency anemia secondary to blood loss (chronic): Secondary | ICD-10-CM

## 2024-10-07 DIAGNOSIS — D649 Anemia, unspecified: Secondary | ICD-10-CM

## 2024-10-07 LAB — CBC
HCT: 27.1 % — ABNORMAL LOW (ref 39.0–52.0)
Hemoglobin: 8 g/dL — ABNORMAL LOW (ref 13.0–17.0)
MCH: 27.1 pg (ref 26.0–34.0)
MCHC: 29.5 g/dL — ABNORMAL LOW (ref 30.0–36.0)
MCV: 91.9 fL (ref 80.0–100.0)
Platelets: 312 K/uL (ref 150–400)
RBC: 2.95 MIL/uL — ABNORMAL LOW (ref 4.22–5.81)
RDW: 15.1 % (ref 11.5–15.5)
WBC: 8.4 K/uL (ref 4.0–10.5)
nRBC: 0 % (ref 0.0–0.2)

## 2024-10-07 LAB — SAMPLE TO BLOOD BANK

## 2024-10-07 MED ORDER — SODIUM CHLORIDE 0.9 % IV SOLN: INTRAVENOUS | Status: DC

## 2024-10-07 MED ORDER — HEPARIN SOD (PORK) LOCK FLUSH 100 UNIT/ML IV SOLN: 250.0000 [IU] | Freq: Once | INTRAVENOUS | Status: DC | PRN

## 2024-10-07 MED ORDER — SODIUM CHLORIDE 0.9% FLUSH: 3.0000 mL | Freq: Once | INTRAVENOUS | Status: DC | PRN

## 2024-10-07 MED ORDER — SODIUM CHLORIDE 0.9% FLUSH: 10.0000 mL | Freq: Once | INTRAVENOUS | Status: DC | PRN

## 2024-10-07 MED ORDER — IRON SUCROSE 300 MG IVPB - SIMPLE MED
300.0000 mg | Freq: Once | Status: AC
  Administered 2024-10-07: 300 mg via INTRAVENOUS
  Filled 2024-10-07: qty 200

## 2024-10-07 MED ORDER — ALTEPLASE 2 MG IJ SOLR: 2.0000 mg | Freq: Once | INTRAMUSCULAR | Status: DC | PRN

## 2024-10-07 MED ORDER — HEPARIN SOD (PORK) LOCK FLUSH 100 UNIT/ML IV SOLN: 500.0000 [IU] | Freq: Once | INTRAVENOUS | Status: DC | PRN

## 2024-10-07 NOTE — Progress Notes (Signed)
 Patient presents today for iron infusion.  Patient is in satisfactory condition with no new complaints voiced.  Vital signs are stable.  We will proceed with infusion per provider orders.    Peripheral IV started with good blood return pre and post infusion.  Venofer 300 mg given today per MD orders. Tolerated infusion without adverse affects. Vital signs stable. No complaints at this time. Discharged from clinic ambulatory in stable condition. Alert and oriented x 3. F/U with [facility] as scheduled.

## 2024-10-07 NOTE — Patient Instructions (Signed)
 [facility] - A DEPT OF [facility]  Discharge Instructions: Thank you for choosing [facility] to provide your oncology and hematology care.  If you have a lab appointment with the Cancer Center - please note that after April 8th, 2024, all labs will be drawn in the cancer center.  You do not have to check in or register with the main entrance as you have in the past but will complete your check-in in the cancer center.  Wear comfortable clothing and clothing appropriate for easy access to any Portacath or PICC line.   We strive to give you quality time with your provider. You may need to reschedule your appointment if you arrive late (15 or more minutes).  Arriving late affects you and other patients whose appointments are after yours.  Also, if you miss three or more appointments without notifying the office, you may be dismissed from the clinic at the provider's discretion.      For prescription refill requests, have your pharmacy contact our office and allow 72 hours for refills to be completed.    Today you received Venofer  300 mg IV iron  infusion.      BELOW ARE SYMPTOMS THAT SHOULD BE REPORTED IMMEDIATELY: *FEVER GREATER THAN 100.4 F (38 C) OR HIGHER *CHILLS OR SWEATING *NAUSEA AND VOMITING THAT IS NOT CONTROLLED WITH YOUR NAUSEA MEDICATION *UNUSUAL SHORTNESS OF BREATH *UNUSUAL BRUISING OR BLEEDING *URINARY PROBLEMS (pain or burning when urinating, or frequent urination) *BOWEL PROBLEMS (unusual diarrhea, constipation, pain near the anus) TENDERNESS IN MOUTH AND THROAT WITH OR WITHOUT PRESENCE OF ULCERS (sore throat, sores in mouth, or a toothache) UNUSUAL RASH, SWELLING OR PAIN  UNUSUAL VAGINAL DISCHARGE OR ITCHING   Items with * indicate a potential emergency and should be followed up as soon as possible or go to the Emergency Department if any problems should occur.  Please show the CHEMOTHERAPY ALERT CARD or IMMUNOTHERAPY ALERT  CARD at check-in to the Emergency Department and triage nurse.  Should you have questions after your visit or need to cancel or reschedule your appointment, please contact [facility] [place] - A DEPT OF [facility] [phone]  and follow the prompts.  Office hours are 8:00 a.m. to 4:30 p.m. Monday - Friday. Please note that voicemails left after 4:00 p.m. may not be returned until the following business day.  We are closed weekends and major holidays. You have access to a nurse at all times for urgent questions. Please call the main number to the clinic [phone] and follow the prompts.  For any non-urgent questions, you may also contact your provider using MyChart. We now offer e-Visits for anyone 50 and older to request care online for non-urgent symptoms. For details visit [email or web address].   Also download the MyChart app! Go to the app store, search MyChart, open the app, select [facility], and log in with your MyChart username and password.

## 2024-10-13 ENCOUNTER — Encounter (INDEPENDENT_AMBULATORY_CARE_PROVIDER_SITE_OTHER): Payer: Self-pay | Admitting: Gastroenterology

## 2024-10-15 ENCOUNTER — Inpatient Hospital Stay

## 2024-10-15 ENCOUNTER — Inpatient Hospital Stay: Attending: Physician Assistant

## 2024-10-15 VITALS — BP 133/61 | HR 72 | Temp 97.0°F | Resp 18

## 2024-10-15 DIAGNOSIS — D631 Anemia in chronic kidney disease: Secondary | ICD-10-CM

## 2024-10-15 DIAGNOSIS — D649 Anemia, unspecified: Secondary | ICD-10-CM

## 2024-10-15 DIAGNOSIS — D500 Iron deficiency anemia secondary to blood loss (chronic): Secondary | ICD-10-CM | POA: Diagnosis present

## 2024-10-15 DIAGNOSIS — N184 Chronic kidney disease, stage 4 (severe): Secondary | ICD-10-CM | POA: Insufficient documentation

## 2024-10-15 DIAGNOSIS — Z79899 Other long term (current) drug therapy: Secondary | ICD-10-CM | POA: Insufficient documentation

## 2024-10-15 LAB — CBC
HCT: 32.1 % — ABNORMAL LOW (ref 39.0–52.0)
Hemoglobin: 9.4 g/dL — ABNORMAL LOW (ref 13.0–17.0)
MCH: 27.4 pg (ref 26.0–34.0)
MCHC: 29.3 g/dL — ABNORMAL LOW (ref 30.0–36.0)
MCV: 93.6 fL (ref 80.0–100.0)
Platelets: 279 K/uL (ref 150–400)
RBC: 3.43 MIL/uL — ABNORMAL LOW (ref 4.22–5.81)
RDW: 15.1 % (ref 11.5–15.5)
WBC: 7.7 K/uL (ref 4.0–10.5)
nRBC: 0 % (ref 0.0–0.2)

## 2024-10-15 LAB — SAMPLE TO BLOOD BANK

## 2024-10-15 MED ORDER — SODIUM CHLORIDE 0.9 % IV SOLN: INTRAVENOUS | Status: DC

## 2024-10-15 MED ORDER — SODIUM CHLORIDE 0.9 % IV SOLN
300.0000 mg | Freq: Once | INTRAVENOUS | Status: AC
  Administered 2024-10-15: 300 mg via INTRAVENOUS
  Filled 2024-10-15: qty 300

## 2024-10-15 NOTE — Progress Notes (Signed)
 Pt took his own tylenol  and zyrtec  this am.   Venofer  300 mg iron  infusion given per orders. Patient tolerated it well without problems. Pt does not wait his post iron  time. Vitals stable and discharged home from clinic ambulatory. Follow up as scheduled.

## 2024-10-21 ENCOUNTER — Inpatient Hospital Stay

## 2024-10-21 DIAGNOSIS — D631 Anemia in chronic kidney disease: Secondary | ICD-10-CM

## 2024-10-21 DIAGNOSIS — D500 Iron deficiency anemia secondary to blood loss (chronic): Secondary | ICD-10-CM | POA: Diagnosis not present

## 2024-10-21 LAB — CBC
HCT: 29 % — ABNORMAL LOW (ref 39.0–52.0)
Hemoglobin: 8.3 g/dL — ABNORMAL LOW (ref 13.0–17.0)
MCH: 26.9 pg (ref 26.0–34.0)
MCHC: 28.6 g/dL — ABNORMAL LOW (ref 30.0–36.0)
MCV: 94.2 fL (ref 80.0–100.0)
Platelets: 366 K/uL (ref 150–400)
RBC: 3.08 MIL/uL — ABNORMAL LOW (ref 4.22–5.81)
RDW: 15.5 % (ref 11.5–15.5)
WBC: 9.2 K/uL (ref 4.0–10.5)
nRBC: 0 % (ref 0.0–0.2)

## 2024-10-21 LAB — SAMPLE TO BLOOD BANK

## 2024-10-22 ENCOUNTER — Other Ambulatory Visit (INDEPENDENT_AMBULATORY_CARE_PROVIDER_SITE_OTHER): Payer: Self-pay | Admitting: Vascular Surgery

## 2024-10-22 DIAGNOSIS — Z9889 Other specified postprocedural states: Secondary | ICD-10-CM

## 2024-10-26 ENCOUNTER — Ambulatory Visit: Payer: Self-pay | Admitting: Physician Assistant

## 2024-10-26 ENCOUNTER — Ambulatory Visit (INDEPENDENT_AMBULATORY_CARE_PROVIDER_SITE_OTHER)

## 2024-10-26 ENCOUNTER — Encounter (INDEPENDENT_AMBULATORY_CARE_PROVIDER_SITE_OTHER): Payer: Self-pay | Admitting: Vascular Surgery

## 2024-10-26 ENCOUNTER — Ambulatory Visit (INDEPENDENT_AMBULATORY_CARE_PROVIDER_SITE_OTHER): Admitting: Vascular Surgery

## 2024-10-26 VITALS — BP 123/69 | HR 80 | Resp 18 | Ht 70.5 in | Wt 160.2 lb

## 2024-10-26 DIAGNOSIS — I739 Peripheral vascular disease, unspecified: Secondary | ICD-10-CM

## 2024-10-26 DIAGNOSIS — J42 Unspecified chronic bronchitis: Secondary | ICD-10-CM

## 2024-10-26 DIAGNOSIS — Z9889 Other specified postprocedural states: Secondary | ICD-10-CM | POA: Diagnosis not present

## 2024-10-26 DIAGNOSIS — K219 Gastro-esophageal reflux disease without esophagitis: Secondary | ICD-10-CM
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
## 2024-10-26 NOTE — Progress Notes (Signed)
 Hemoglobin remains low despite Venofer  300 mg x 3. Suspect that residual anemia is at least in part due to his CKD stage IIIb/IV. At last visit, we had discussed initiating Retacrit therapy, and reviewed risks of adverse events and side effects.  Patient contacted triage nurse earlier this week to see if we could go ahead and start Retacrit injections now.  Since Hgb continues to remain at <9.0, we can certainly go ahead and start Retacrit therapy now, initial dosing with 10,000 units every 2 weeks. He is scheduled for CBC check on Monday, 11/01/2024, we will schedule him to receive first dose of Retacrit at that time. I will be seeing him for follow-up in early December 2025 to see how he is tolerating Retacrit.  TOMI: There was no answer when I tried to call patient this afternoon.  Can you notify patient and also please assist with adding Retacrit injection after lab appointment on 11/01/2024 and again on the same day as his visit with me on 11/17/2024?  (I have entered orders.)

## 2024-10-27 ENCOUNTER — Telehealth (INDEPENDENT_AMBULATORY_CARE_PROVIDER_SITE_OTHER): Payer: Self-pay | Admitting: Gastroenterology

## 2024-10-27 ENCOUNTER — Telehealth (INDEPENDENT_AMBULATORY_CARE_PROVIDER_SITE_OTHER): Payer: Self-pay

## 2024-10-27 LAB — VAS US ABI WITH/WO TBI
Left ABI: 0.67
Right ABI: 0.78

## 2024-10-27 NOTE — Telephone Encounter (Signed)
 Patient son was notified with medical recommendation and verbalized understanding

## 2024-10-27 NOTE — Telephone Encounter (Signed)
 Cirrhosis in and of itself is going to lead to increased bleeding.  Because of that there really is not way to prevent bleeding.  He had extensive reconstruction of his vascular system with recent intervention in July.  I would recommend he remain on plavix  for at least a few more months, at which time he can continue with only ASA

## 2024-10-27 NOTE — Telephone Encounter (Signed)
 Pt son Jeffery Ponce left message asking for a nurse to call him. Returned call to patient son. Jeffery Ponce states pt see [facility] and had 6 stints placed and was prescribed Plavix . Pt has history of cirrohosis and pt son is wanting to make sure Plavix  is not going to cause pt liver to bleed to much and cause pt to have to need a blood or iron  transfusion. Please advise. Thank you

## 2024-10-27 NOTE — Telephone Encounter (Signed)
 Patient son left a message stating that his father was seen on yesterday and was advised by the provider to continue taking Clopidogrel . Patient son stated that his father has cirrhosis of the liver and Clopidogrel  may cause bleeding. He would like to know if there are any other options to take to prevent bleeding. Please Advise

## 2024-10-27 NOTE — Progress Notes (Signed)
 Spoke with patient and educated on Retacrit injections.  First injection scheduled for Monday 11/24.  Patient verbalized understanding and will pick up new schedule Monday for subsequent injections.

## 2024-10-28 NOTE — Telephone Encounter (Signed)
 Pt son Carlin contacted and verbalized understanding.

## 2024-10-28 NOTE — Telephone Encounter (Signed)
 Hi, There is always an inherent risk of bleeding while on antiplatelet medication such as Plavix .  There is no way that we can predict if he will bleed with this medication.  However, as he had recent stent placement, this medication will prevent the blockage of these stents.  He should let us  know if he is presenting any issues with bleeding such as black stool or fresh blood in the stool, vomiting blood. Thanks

## 2024-11-01 ENCOUNTER — Telehealth (INDEPENDENT_AMBULATORY_CARE_PROVIDER_SITE_OTHER): Payer: Self-pay | Admitting: Gastroenterology

## 2024-11-01 ENCOUNTER — Inpatient Hospital Stay

## 2024-11-01 VITALS — BP 136/60 | HR 89 | Temp 96.7°F | Resp 20

## 2024-11-01 DIAGNOSIS — D631 Anemia in chronic kidney disease: Secondary | ICD-10-CM

## 2024-11-01 DIAGNOSIS — D649 Anemia, unspecified: Secondary | ICD-10-CM

## 2024-11-01 DIAGNOSIS — D500 Iron deficiency anemia secondary to blood loss (chronic): Secondary | ICD-10-CM | POA: Diagnosis not present

## 2024-11-01 LAB — CBC
HCT: 28.6 % — ABNORMAL LOW (ref 39.0–52.0)
Hemoglobin: 8.4 g/dL — ABNORMAL LOW (ref 13.0–17.0)
MCH: 27.7 pg (ref 26.0–34.0)
MCHC: 29.4 g/dL — ABNORMAL LOW (ref 30.0–36.0)
MCV: 94.4 fL (ref 80.0–100.0)
Platelets: 371 K/uL (ref 150–400)
RBC: 3.03 MIL/uL — ABNORMAL LOW (ref 4.22–5.81)
RDW: 15.9 % — ABNORMAL HIGH (ref 11.5–15.5)
WBC: 10.1 K/uL (ref 4.0–10.5)
nRBC: 0 % (ref 0.0–0.2)

## 2024-11-01 LAB — SAMPLE TO BLOOD BANK

## 2024-11-01 MED ORDER — EPOETIN ALFA-EPBX 10000 UNIT/ML IJ SOLN
10000.0000 [IU] | Freq: Once | INTRAMUSCULAR | Status: AC
  Administered 2024-11-01: 10000 [IU] via SUBCUTANEOUS
  Filled 2024-11-01: qty 1

## 2024-11-01 NOTE — Telephone Encounter (Signed)
 Patient came to front desk saying his stools were black and he had been taking iron  pills. I told him that iron  would cause that. Then he said his blood count was down. He also said he was told to stop taking iron  for a few days to see if it clears up. Please advise if he needs an office visit. [phone] or [phone]

## 2024-11-01 NOTE — Progress Notes (Signed)
 Patient's Hgb 8.4 and blood pressure stable. Patient tolerated injection with no complaints voiced.  Site clean and dry with no bruising or swelling noted at site.  See MAR for details.  Band aid applied.  Patient stable during and after injection.  Vss with discharge and left in satisfactory condition with no s/s of distress noted. All follow ups as scheduled.   Jeffery Ponce

## 2024-11-01 NOTE — Telephone Encounter (Signed)
 Patient called with dark stools, on going for months.  When did the rectal bleeding start?:No bright red blood seen, just dark stools on tissues when he wipes.  Was it sudden or gradual?: Gradual over the last few months  Has it happened before?:Yes, patient being seen by [facility]. Had Hgb done today of 8.4. He received Epo today ( first shot) and is due for this Q two weeks. He is taking po Fe, was taking daily, until this week started taking every other day.   How many episodes?: Every bm, for the last two months.  How long does the bleeding last when it occurs?:See above  What does the blood look like (bright red, dark red, maroon, black/tarry)?: no bright red blood seen just darker stools on tissue, when wiping.  Is the blood mixed with the stool, on the toilet paper, or dripping into the bowl?: No  Any mucus or clots:?No mucus, no blood clots, no abdominal pain.   How much blood is there (mild, moderate, or heavy)?: N/A  Any relation to bowel movements?: Yes  Any associated abdominal pain or rectal pain?: No  Any history of hemorrhoids:? No, and denies any rectal pain   Has anything improved the bleeding (e.g. stool softeners, dietary changes, avoiding certain activities)?: No. Patient says he has been told the anemia may be due to CKD  Does the bleeding happen at specific times of day or with particular activities?: No just notices with bm's  Is there any associated fatigue, dizziness, lightheadedness, syncope, chest pain, shortness of breath?: Yes, he has shortness of breath at times.  Patient is on low dose Aspirin  and Plavix  daily.   Current pharmacy: [facility]  Last procedure:  Last office visit: 09/07/2024 with Mitzie.  Please advise.

## 2024-11-01 NOTE — Telephone Encounter (Signed)
 I called and left a Vm, asked that the patient please return call to the office.

## 2024-11-01 NOTE — Telephone Encounter (Signed)
 I spoke with the patient and made him aware per [facility], Unclear if darker stools are secondary to PO iron  or if he has another UGI bleed. He should continue to follow recommendations of hematology on iron  supplementation/Iron  infusions/EPO.    Let's have him do occult stool cards x3 to determine if there is blood in his stools, if so, we will need to get him scheduled for repeat EGD.   Patient states understanding and he will come by the office to pick up the cards. I have left them at the front desk for him to pick up.

## 2024-11-08 ENCOUNTER — Telehealth (INDEPENDENT_AMBULATORY_CARE_PROVIDER_SITE_OTHER): Payer: Self-pay

## 2024-11-08 ENCOUNTER — Telehealth (INDEPENDENT_AMBULATORY_CARE_PROVIDER_SITE_OTHER): Payer: Self-pay | Admitting: Gastroenterology

## 2024-11-08 DIAGNOSIS — D508 Other iron deficiency anemias: Secondary | ICD-10-CM

## 2024-11-08 DIAGNOSIS — K921 Melena: Secondary | ICD-10-CM | POA: Diagnosis not present

## 2024-11-08 LAB — POC HEMOCCULT BLD/STL (HOME/3-CARD/SCREEN)
Card #2 Fecal Occult Blod, POC: POSITIVE
Card #3 Fecal Occult Blood, POC: POSITIVE
Fecal Occult Blood, POC: POSITIVE — AB

## 2024-11-08 NOTE — Telephone Encounter (Signed)
 Pt left voicemail stating he could be reached at [phone]. Returned call to pt and gave provider recommendations. Pt verbalized understanding.

## 2024-11-08 NOTE — Telephone Encounter (Signed)
 Left message for patient to return call.

## 2024-11-08 NOTE — Telephone Encounter (Signed)
 Thanks

## 2024-11-08 NOTE — Telephone Encounter (Signed)
    11/08/24  [name] 05-May-1946  What type of surgery is being performed? EGD with possible small bowel enteroscopy  When is surgery scheduled? TBD  What type of clearance is required (medical or pharmacy to hold medication or both? medication  Are there any medications that need to be held prior to surgery and how long? Plavix , 5 days prior.  Name of physician performing surgery?  Dr. Eartha Rouse [facility] at [facility] Phone: [phone] Fax: [phone]  Anethesia type (none, local, MAC, general)? MAC

## 2024-11-08 NOTE — Telephone Encounter (Signed)
 Pt brought in hemoccult cards x3. No dates on cards. All 3 were positive. Please advise. Thank you.

## 2024-11-15 ENCOUNTER — Inpatient Hospital Stay: Attending: Physician Assistant

## 2024-11-15 ENCOUNTER — Inpatient Hospital Stay

## 2024-11-15 DIAGNOSIS — J449 Chronic obstructive pulmonary disease, unspecified: Secondary | ICD-10-CM | POA: Insufficient documentation

## 2024-11-15 DIAGNOSIS — N184 Chronic kidney disease, stage 4 (severe): Secondary | ICD-10-CM | POA: Insufficient documentation

## 2024-11-15 DIAGNOSIS — K922 Gastrointestinal hemorrhage, unspecified: Secondary | ICD-10-CM | POA: Insufficient documentation

## 2024-11-15 DIAGNOSIS — Z833 Family history of diabetes mellitus: Secondary | ICD-10-CM | POA: Insufficient documentation

## 2024-11-15 DIAGNOSIS — Z9079 Acquired absence of other genital organ(s): Secondary | ICD-10-CM | POA: Insufficient documentation

## 2024-11-15 DIAGNOSIS — Z8701 Personal history of pneumonia (recurrent): Secondary | ICD-10-CM | POA: Insufficient documentation

## 2024-11-15 DIAGNOSIS — Z79899 Other long term (current) drug therapy: Secondary | ICD-10-CM | POA: Insufficient documentation

## 2024-11-15 DIAGNOSIS — Z860100 Personal history of colon polyps, unspecified: Secondary | ICD-10-CM | POA: Insufficient documentation

## 2024-11-15 DIAGNOSIS — Z880 Allergy status to penicillin: Secondary | ICD-10-CM | POA: Insufficient documentation

## 2024-11-15 DIAGNOSIS — E611 Iron deficiency: Secondary | ICD-10-CM | POA: Insufficient documentation

## 2024-11-15 DIAGNOSIS — D631 Anemia in chronic kidney disease: Secondary | ICD-10-CM | POA: Insufficient documentation

## 2024-11-15 DIAGNOSIS — R200 Anesthesia of skin: Secondary | ICD-10-CM | POA: Insufficient documentation

## 2024-11-15 DIAGNOSIS — Z800 Family history of malignant neoplasm of digestive organs: Secondary | ICD-10-CM | POA: Insufficient documentation

## 2024-11-15 DIAGNOSIS — Z8551 Personal history of malignant neoplasm of bladder: Secondary | ICD-10-CM | POA: Insufficient documentation

## 2024-11-15 DIAGNOSIS — R0602 Shortness of breath: Secondary | ICD-10-CM | POA: Insufficient documentation

## 2024-11-15 DIAGNOSIS — R42 Dizziness and giddiness: Secondary | ICD-10-CM | POA: Insufficient documentation

## 2024-11-15 DIAGNOSIS — Z87442 Personal history of urinary calculi: Secondary | ICD-10-CM | POA: Insufficient documentation

## 2024-11-15 DIAGNOSIS — K5900 Constipation, unspecified: Secondary | ICD-10-CM | POA: Insufficient documentation

## 2024-11-15 DIAGNOSIS — Z7982 Long term (current) use of aspirin: Secondary | ICD-10-CM | POA: Insufficient documentation

## 2024-11-15 DIAGNOSIS — Z881 Allergy status to other antibiotic agents status: Secondary | ICD-10-CM | POA: Insufficient documentation

## 2024-11-15 DIAGNOSIS — K229 Disease of esophagus, unspecified: Secondary | ICD-10-CM | POA: Insufficient documentation

## 2024-11-15 DIAGNOSIS — I2510 Atherosclerotic heart disease of native coronary artery without angina pectoris: Secondary | ICD-10-CM | POA: Insufficient documentation

## 2024-11-15 DIAGNOSIS — Z7902 Long term (current) use of antithrombotics/antiplatelets: Secondary | ICD-10-CM | POA: Insufficient documentation

## 2024-11-15 DIAGNOSIS — Z888 Allergy status to other drugs, medicaments and biological substances status: Secondary | ICD-10-CM | POA: Insufficient documentation

## 2024-11-15 DIAGNOSIS — Z720 Tobacco use: Secondary | ICD-10-CM | POA: Insufficient documentation
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
## 2024-11-17 ENCOUNTER — Inpatient Hospital Stay

## 2024-11-17 ENCOUNTER — Inpatient Hospital Stay (HOSPITAL_BASED_OUTPATIENT_CLINIC_OR_DEPARTMENT_OTHER): Admitting: Physician Assistant

## 2024-11-17 VITALS — BP 129/69 | HR 81 | Temp 97.9°F | Resp 18 | Wt 161.0 lb

## 2024-11-17 DIAGNOSIS — Z7982 Long term (current) use of aspirin: Secondary | ICD-10-CM | POA: Diagnosis not present

## 2024-11-17 DIAGNOSIS — R911 Solitary pulmonary nodule: Secondary | ICD-10-CM | POA: Diagnosis not present

## 2024-11-17 DIAGNOSIS — Z8701 Personal history of pneumonia (recurrent): Secondary | ICD-10-CM | POA: Diagnosis not present

## 2024-11-17 DIAGNOSIS — R42 Dizziness and giddiness: Secondary | ICD-10-CM | POA: Diagnosis not present

## 2024-11-17 DIAGNOSIS — D500 Iron deficiency anemia secondary to blood loss (chronic): Secondary | ICD-10-CM | POA: Diagnosis not present

## 2024-11-17 DIAGNOSIS — E538 Deficiency of other specified B group vitamins: Secondary | ICD-10-CM

## 2024-11-17 DIAGNOSIS — K5900 Constipation, unspecified: Secondary | ICD-10-CM | POA: Diagnosis not present

## 2024-11-17 DIAGNOSIS — Z7902 Long term (current) use of antithrombotics/antiplatelets: Secondary | ICD-10-CM | POA: Diagnosis not present

## 2024-11-17 DIAGNOSIS — Z888 Allergy status to other drugs, medicaments and biological substances status: Secondary | ICD-10-CM | POA: Diagnosis not present

## 2024-11-17 DIAGNOSIS — N184 Chronic kidney disease, stage 4 (severe): Secondary | ICD-10-CM | POA: Diagnosis not present

## 2024-11-17 DIAGNOSIS — Z860100 Personal history of colon polyps, unspecified: Secondary | ICD-10-CM | POA: Diagnosis not present

## 2024-11-17 DIAGNOSIS — E611 Iron deficiency: Secondary | ICD-10-CM | POA: Diagnosis not present

## 2024-11-17 DIAGNOSIS — R9389 Abnormal findings on diagnostic imaging of other specified body structures: Secondary | ICD-10-CM | POA: Diagnosis not present

## 2024-11-17 DIAGNOSIS — K922 Gastrointestinal hemorrhage, unspecified: Secondary | ICD-10-CM | POA: Diagnosis not present

## 2024-11-17 DIAGNOSIS — D631 Anemia in chronic kidney disease: Secondary | ICD-10-CM

## 2024-11-17 DIAGNOSIS — Z79899 Other long term (current) drug therapy: Secondary | ICD-10-CM | POA: Diagnosis not present

## 2024-11-17 DIAGNOSIS — I2510 Atherosclerotic heart disease of native coronary artery without angina pectoris: Secondary | ICD-10-CM | POA: Diagnosis not present

## 2024-11-17 DIAGNOSIS — R0602 Shortness of breath: Secondary | ICD-10-CM | POA: Diagnosis not present

## 2024-11-17 DIAGNOSIS — K229 Disease of esophagus, unspecified: Secondary | ICD-10-CM | POA: Diagnosis not present

## 2024-11-17 DIAGNOSIS — Z833 Family history of diabetes mellitus: Secondary | ICD-10-CM | POA: Diagnosis not present

## 2024-11-17 DIAGNOSIS — Z881 Allergy status to other antibiotic agents status: Secondary | ICD-10-CM | POA: Diagnosis not present

## 2024-11-17 DIAGNOSIS — Z720 Tobacco use: Secondary | ICD-10-CM | POA: Diagnosis not present

## 2024-11-17 DIAGNOSIS — Z8551 Personal history of malignant neoplasm of bladder: Secondary | ICD-10-CM | POA: Diagnosis not present

## 2024-11-17 DIAGNOSIS — D649 Anemia, unspecified: Secondary | ICD-10-CM

## 2024-11-17 DIAGNOSIS — J449 Chronic obstructive pulmonary disease, unspecified: Secondary | ICD-10-CM | POA: Diagnosis not present

## 2024-11-17 DIAGNOSIS — R200 Anesthesia of skin: Secondary | ICD-10-CM | POA: Diagnosis not present

## 2024-11-17 DIAGNOSIS — Z87442 Personal history of urinary calculi: Secondary | ICD-10-CM | POA: Diagnosis not present

## 2024-11-17 DIAGNOSIS — Z880 Allergy status to penicillin: Secondary | ICD-10-CM | POA: Diagnosis not present

## 2024-11-17 DIAGNOSIS — Z9079 Acquired absence of other genital organ(s): Secondary | ICD-10-CM | POA: Diagnosis not present

## 2024-11-17 LAB — CBC WITH DIFFERENTIAL/PLATELET
Abs Immature Granulocytes: 0.05 K/uL (ref 0.00–0.07)
Basophils Absolute: 0 K/uL (ref 0.0–0.1)
Basophils Relative: 0 %
Eosinophils Absolute: 0.4 K/uL (ref 0.0–0.5)
Eosinophils Relative: 4 %
HCT: 27.5 % — ABNORMAL LOW (ref 39.0–52.0)
Hemoglobin: 8.1 g/dL — ABNORMAL LOW (ref 13.0–17.0)
Immature Granulocytes: 1 %
Lymphocytes Relative: 21 %
Lymphs Abs: 1.7 K/uL (ref 0.7–4.0)
MCH: 28 pg (ref 26.0–34.0)
MCHC: 29.5 g/dL — ABNORMAL LOW (ref 30.0–36.0)
MCV: 95.2 fL (ref 80.0–100.0)
Monocytes Absolute: 0.5 K/uL (ref 0.1–1.0)
Monocytes Relative: 7 %
Neutro Abs: 5.4 K/uL (ref 1.7–7.7)
Neutrophils Relative %: 67 %
Platelets: 300 K/uL (ref 150–400)
RBC: 2.89 MIL/uL — ABNORMAL LOW (ref 4.22–5.81)
RDW: 15.1 % (ref 11.5–15.5)
WBC: 8.1 K/uL (ref 4.0–10.5)
nRBC: 0 % (ref 0.0–0.2)

## 2024-11-17 LAB — SAMPLE TO BLOOD BANK

## 2024-11-17 LAB — COMPREHENSIVE METABOLIC PANEL WITH GFR
ALT: <5 U/L (ref 0–44)
AST: 17 U/L (ref 15–41)
Albumin: 3.9 g/dL (ref 3.5–5.0)
Alkaline Phosphatase: 122 U/L (ref 38–126)
Anion gap: 15 (ref 5–15)
BUN: 29 mg/dL — ABNORMAL HIGH (ref 8–23)
CO2: 20 mmol/L — ABNORMAL LOW (ref 22–32)
Calcium: 9.1 mg/dL (ref 8.9–10.3)
Chloride: 106 mmol/L (ref 98–111)
Creatinine, Ser: 2.09 mg/dL — ABNORMAL HIGH (ref 0.61–1.24)
GFR, Estimated: 32 mL/min — ABNORMAL LOW (ref >60)
Glucose, Bld: 104 mg/dL — ABNORMAL HIGH (ref 70–99)
Potassium: 3.5 mmol/L (ref 3.5–5.1)
Sodium: 141 mmol/L (ref 135–145)
Total Bilirubin: <0.2 mg/dL (ref 0.0–1.2)
Total Protein: 6.7 g/dL (ref 6.5–8.1)

## 2024-11-17 LAB — VITAMIN B12: Vitamin B-12: 819 pg/mL (ref 180–914)

## 2024-11-17 LAB — FERRITIN: Ferritin: 142 ng/mL (ref 24–336)

## 2024-11-17 LAB — IRON AND TIBC
Iron: 37 ug/dL — ABNORMAL LOW (ref 45–182)
Saturation Ratios: 14 % — ABNORMAL LOW (ref 17.9–39.5)
TIBC: 262 ug/dL (ref 250–450)
UIBC: 225 ug/dL

## 2024-11-17 MED ORDER — EPOETIN ALFA-EPBX 10000 UNIT/ML IJ SOLN
10000.0000 [IU] | Freq: Once | INTRAMUSCULAR | Status: AC
  Administered 2024-11-17: 10000 [IU] via SUBCUTANEOUS
  Filled 2024-11-17: qty 1

## 2024-11-17 NOTE — Patient Instructions (Signed)
 [facility] at [facility] **VISIT SUMMARY & IMPORTANT INSTRUCTIONS **   You were seen today by Pleasant Barefoot [name] for your anemia and your history of bladder cancer.    ANEMIA: Your anemia is caused by two separate issues  - chronic blood loss from your stomach/intestines, as well as chronic kidney disease. Your iron  levels are low.  This is most likely from ongoing bleeding from your stomach.  We will schedule you for IV iron  x 3 doses. We will continue to check your blood and give Retacrit  injection every 2 weeks.  This will help your body to make more blood cells (to treat your anemia related to CKD). We will give blood transfusion if your hemoglobin is <8.0. Your vitamin B12 level has improved.  Continue taking vitamin B12 tablet (1000 mcg) daily. Continue close follow-up with gastroenterologists to treat the bleeding from your stomach/intestines.   LUNG NODULES: We will check another CT scan of your chest in January 2026.  CHRONIC KIDNEY DISEASE: Continue follow-up with your kidney doctor (Dr. Rachele)  HISTORY OF BLADDER CANCER:  Continue follow-up with your urologist (Dr. Alvaro)  LIVER CIRRHOSIS: Continue follow-up with your gastroenterologist (NP Mitzie Boettcher).  FOLLOW-UP APPOINTMENT: Labs and office visit in 3 months  ** Thank you for trusting me with your healthcare!  I strive to provide all of my patients with quality care at each visit.  If you receive a survey for this visit, I would be so grateful to you for taking the time to provide feedback.  Thank you in advance!  ~ Alben Jepsen                                        Dr. Mickiel Davonna Pleasant Barefoot, [name]     Delon Hope, NP   - - - - - - - - - - - - - - - - - -     Thank you for choosing [facility] at [facility] to provide your oncology and hematology care.  To afford each patient quality time with our provider, please arrive at least 15 minutes before your  scheduled appointment time.   If you have a lab appointment with the Cancer Center please come in thru the Main Entrance and check in at the main information desk.  You need to re-schedule your appointment should you arrive 10 or more minutes late.  We strive to give you quality time with our providers, and arriving late affects you and other patients whose appointments are after yours.  Also, if you no show three or more times for appointments you may be dismissed from the clinic at the providers discretion.     Again, thank you for choosing [facility].  Our hope is that these requests will decrease the amount of time that you wait before being seen by our physicians.       _____________________________________________________________  Should you have questions after your visit to [facility], please contact our office at [phone] and follow the prompts.  Our office hours are 8:00 a.m. and 4:30 p.m. Monday - Friday.  Please note that voicemails left after 4:00 p.m. may not be returned until the following business day.  We are closed weekends and major holidays.  You do have access to a nurse 24-7, just call  the main number to the clinic [phone] and do not press any options, hold on the line and a nurse will answer the phone.    For prescription refill requests, have your pharmacy contact our office and allow 72 hours.

## 2024-11-17 NOTE — Patient Instructions (Signed)

## 2024-11-17 NOTE — Progress Notes (Signed)
Patient presents today for Retacrit injection. Hemoglobin reviewed prior to administration. VSS tolerated without incident or complaint. See MAR for details. Patient stable during and after injection. Patient discharged in satisfactory condition with no s/s of distress noted.  

## 2024-11-18 NOTE — Telephone Encounter (Signed)
 Patient is calling back to see if his EGD has been scheduled? [phone]

## 2024-11-18 NOTE — Telephone Encounter (Signed)
 ATC patient, he was not home and the lady stated to call back in about 10 minutes.

## 2024-11-19 ENCOUNTER — Inpatient Hospital Stay

## 2024-11-19 VITALS — BP 138/44 | HR 79 | Temp 97.3°F | Resp 18

## 2024-11-19 DIAGNOSIS — D649 Anemia, unspecified: Secondary | ICD-10-CM

## 2024-11-19 DIAGNOSIS — D500 Iron deficiency anemia secondary to blood loss (chronic): Secondary | ICD-10-CM

## 2024-11-19 DIAGNOSIS — N184 Chronic kidney disease, stage 4 (severe): Secondary | ICD-10-CM | POA: Diagnosis not present

## 2024-11-19 DIAGNOSIS — D631 Anemia in chronic kidney disease: Secondary | ICD-10-CM

## 2024-11-19 MED ORDER — IRON SUCROSE 300 MG IVPB - SIMPLE MED
300.0000 mg | Freq: Once | Status: AC
  Administered 2024-11-19: 300 mg via INTRAVENOUS
  Filled 2024-11-19: qty 100

## 2024-11-19 MED ORDER — SODIUM CHLORIDE 0.9 % IV SOLN: INTRAVENOUS | Status: DC

## 2024-11-19 NOTE — Patient Instructions (Signed)
 [facility] - A DEPT OF [facility]  Discharge Instructions: Thank you for choosing [facility] to provide your oncology and hematology care.  If you have a lab appointment with the Cancer Center - please note that after April 8th, 2024, all labs will be drawn in the cancer center.  You do not have to check in or register with the main entrance as you have in the past but will complete your check-in in the cancer center.  Wear comfortable clothing and clothing appropriate for easy access to any Portacath or PICC line.   We strive to give you quality time with your provider. You may need to reschedule your appointment if you arrive late (15 or more minutes).  Arriving late affects you and other patients whose appointments are after yours.  Also, if you miss three or more appointments without notifying the office, you may be dismissed from the clinic at the provider's discretion.      For prescription refill requests, have your pharmacy contact our office and allow 72 hours for refills to be completed.    Today you received the following Venofer , return as scheduled.   To help prevent nausea and vomiting after your treatment, we encourage you to take your nausea medication as directed.  BELOW ARE SYMPTOMS THAT SHOULD BE REPORTED IMMEDIATELY: *FEVER GREATER THAN 100.4 F (38 C) OR HIGHER *CHILLS OR SWEATING *NAUSEA AND VOMITING THAT IS NOT CONTROLLED WITH YOUR NAUSEA MEDICATION *UNUSUAL SHORTNESS OF BREATH *UNUSUAL BRUISING OR BLEEDING *URINARY PROBLEMS (pain or burning when urinating, or frequent urination) *BOWEL PROBLEMS (unusual diarrhea, constipation, pain near the anus) TENDERNESS IN MOUTH AND THROAT WITH OR WITHOUT PRESENCE OF ULCERS (sore throat, sores in mouth, or a toothache) UNUSUAL RASH, SWELLING OR PAIN  UNUSUAL VAGINAL DISCHARGE OR ITCHING   Items with * indicate a potential emergency and should be followed up as soon as possible or  go to the Emergency Department if any problems should occur.  Please show the CHEMOTHERAPY ALERT CARD or IMMUNOTHERAPY ALERT CARD at check-in to the Emergency Department and triage nurse.  Should you have questions after your visit or need to cancel or reschedule your appointment, please contact [facility] [place] - A DEPT OF [facility] [phone]  and follow the prompts.  Office hours are 8:00 a.m. to 4:30 p.m. Monday - Friday. Please note that voicemails left after 4:00 p.m. may not be returned until the following business day.  We are closed weekends and major holidays. You have access to a nurse at all times for urgent questions. Please call the main number to the clinic [phone] and follow the prompts.  For any non-urgent questions, you may also contact your provider using MyChart. We now offer e-Visits for anyone 59 and older to request care online for non-urgent symptoms. For details visit [email or web address].   Also download the MyChart app! Go to the app store, search MyChart, open the app, select [facility], and log in with your MyChart username and password.

## 2024-11-19 NOTE — Progress Notes (Signed)
 Patient tolerated iron  infusion with no complaints voiced. Peripheral IV site clean and dry with good blood return noted before and after infusion. Band aid applied. Patient refused to wait 30 minute wait time. VSS with discharge and left in satisfactory condition with no s/s of distress noted.

## 2024-11-22 LAB — METHYLMALONIC ACID, SERUM: Methylmalonic Acid, Quantitative: 286 nmol/L (ref 0–378)

## 2024-11-23 NOTE — Telephone Encounter (Signed)
 PA on [facility] for EGD/Entero: Notification or Prior Authorization is not required for the requested services You are not required to submit a notification/prior authorization based on the information provided. Decision ID #: [id number]

## 2024-11-23 NOTE — Telephone Encounter (Signed)
 Spoke with patient, scheduled his EGD/Enteroscopy for 11/30/2024 at 9:45am. Patient aware to stop taking his iron  pill today and to stop taking his plavix  starting Thursday 11/25/2024. Patient stated that he will stop by the office around 9am on Friday morning to pick up his instructions.

## 2024-11-26 ENCOUNTER — Encounter (HOSPITAL_COMMUNITY): Payer: Self-pay

## 2024-11-26 ENCOUNTER — Encounter (HOSPITAL_COMMUNITY)
Admission: RE | Admit: 2024-11-26 | Discharge: 2024-11-26 | Disposition: A | Discharge status: Home / Self Care | Source: Ambulatory Visit | Attending: Gastroenterology | Admitting: Gastroenterology

## 2024-11-26 ENCOUNTER — Other Ambulatory Visit: Payer: Self-pay

## 2024-11-26 ENCOUNTER — Inpatient Hospital Stay

## 2024-11-26 VITALS — BP 109/47 | HR 72 | Temp 97.6°F | Resp 18

## 2024-11-26 DIAGNOSIS — D649 Anemia, unspecified: Secondary | ICD-10-CM

## 2024-11-26 DIAGNOSIS — D631 Anemia in chronic kidney disease: Secondary | ICD-10-CM

## 2024-11-26 DIAGNOSIS — N184 Chronic kidney disease, stage 4 (severe): Secondary | ICD-10-CM | POA: Diagnosis not present

## 2024-11-26 DIAGNOSIS — D500 Iron deficiency anemia secondary to blood loss (chronic): Secondary | ICD-10-CM

## 2024-11-26 MED ORDER — IRON SUCROSE 300 MG IVPB - SIMPLE MED
300.0000 mg | Freq: Once | Status: AC
  Administered 2024-11-26: 300 mg via INTRAVENOUS
  Filled 2024-11-26: qty 300

## 2024-11-26 MED ORDER — SODIUM CHLORIDE 0.9 % IV SOLN: INTRAVENOUS | Status: DC

## 2024-11-26 NOTE — Patient Instructions (Signed)

## 2024-11-26 NOTE — Progress Notes (Signed)
 Patient tolerated iron infusion with no complaints voiced.  Peripheral IV site clean and dry with good blood return noted before and after infusion.  Band aid applied.  VSS with discharge and left in satisfactory condition with no s/s of distress noted.

## 2024-11-29 ENCOUNTER — Inpatient Hospital Stay

## 2024-11-29 VITALS — BP 117/50 | HR 83 | Temp 97.8°F | Resp 18

## 2024-11-29 DIAGNOSIS — N184 Chronic kidney disease, stage 4 (severe): Secondary | ICD-10-CM | POA: Diagnosis not present

## 2024-11-29 DIAGNOSIS — D500 Iron deficiency anemia secondary to blood loss (chronic): Secondary | ICD-10-CM

## 2024-11-29 DIAGNOSIS — D649 Anemia, unspecified: Secondary | ICD-10-CM

## 2024-11-29 LAB — CBC
HCT: 29.7 % — ABNORMAL LOW (ref 39.0–52.0)
Hemoglobin: 8.6 g/dL — ABNORMAL LOW (ref 13.0–17.0)
MCH: 28.2 pg (ref 26.0–34.0)
MCHC: 29 g/dL — ABNORMAL LOW (ref 30.0–36.0)
MCV: 97.4 fL (ref 80.0–100.0)
Platelets: 333 K/uL (ref 150–400)
RBC: 3.05 MIL/uL — ABNORMAL LOW (ref 4.22–5.81)
RDW: 15.9 % — ABNORMAL HIGH (ref 11.5–15.5)
WBC: 7.8 K/uL (ref 4.0–10.5)
nRBC: 0 % (ref 0.0–0.2)

## 2024-11-29 LAB — SAMPLE TO BLOOD BANK

## 2024-11-29 MED ORDER — EPOETIN ALFA-EPBX 10000 UNIT/ML IJ SOLN
10000.0000 [IU] | Freq: Once | INTRAMUSCULAR | Status: AC
  Administered 2024-11-29: 10000 [IU] via SUBCUTANEOUS
  Filled 2024-11-29: qty 1

## 2024-11-29 NOTE — Patient Instructions (Signed)

## 2024-11-29 NOTE — Progress Notes (Signed)
 Patient tolerated injection with no complaints voiced.  Site clean and dry with no bruising or swelling noted at site.  See MAR for details.  Band aid applied.  Patient stable during and after injection.  Vss with discharge and left in satisfactory condition with no s/s of distress noted.

## 2024-11-30 ENCOUNTER — Encounter (HOSPITAL_COMMUNITY): Admitting: Anesthesiology

## 2024-11-30 ENCOUNTER — Encounter (HOSPITAL_COMMUNITY): Payer: Self-pay | Admitting: Gastroenterology

## 2024-11-30 ENCOUNTER — Encounter (HOSPITAL_COMMUNITY): Admission: RE | Payer: Self-pay | Source: Home / Self Care

## 2024-11-30 ENCOUNTER — Ambulatory Visit (HOSPITAL_COMMUNITY)
Admission: RE | Admit: 2024-11-30 | Discharge: 2024-11-30 | Disposition: A | Discharge status: Home / Self Care | Attending: Gastroenterology | Admitting: Gastroenterology

## 2024-11-30 ENCOUNTER — Ambulatory Visit (HOSPITAL_COMMUNITY): Admitting: Anesthesiology

## 2024-11-30 DIAGNOSIS — J449 Chronic obstructive pulmonary disease, unspecified: Secondary | ICD-10-CM | POA: Diagnosis not present

## 2024-11-30 DIAGNOSIS — M1990 Unspecified osteoarthritis, unspecified site: Secondary | ICD-10-CM | POA: Insufficient documentation

## 2024-11-30 DIAGNOSIS — Q2733 Arteriovenous malformation of digestive system vessel: Secondary | ICD-10-CM | POA: Diagnosis not present

## 2024-11-30 DIAGNOSIS — K219 Gastro-esophageal reflux disease without esophagitis: Secondary | ICD-10-CM | POA: Diagnosis not present

## 2024-11-30 DIAGNOSIS — Z87891 Personal history of nicotine dependence: Secondary | ICD-10-CM | POA: Insufficient documentation

## 2024-11-30 DIAGNOSIS — K7460 Unspecified cirrhosis of liver: Secondary | ICD-10-CM | POA: Diagnosis not present

## 2024-11-30 DIAGNOSIS — I129 Hypertensive chronic kidney disease with stage 1 through stage 4 chronic kidney disease, or unspecified chronic kidney disease: Secondary | ICD-10-CM | POA: Insufficient documentation

## 2024-11-30 DIAGNOSIS — K5520 Angiodysplasia of colon without hemorrhage: Secondary | ICD-10-CM | POA: Diagnosis not present

## 2024-11-30 DIAGNOSIS — D509 Iron deficiency anemia, unspecified: Secondary | ICD-10-CM

## 2024-11-30 DIAGNOSIS — K31811 Angiodysplasia of stomach and duodenum with bleeding: Secondary | ICD-10-CM

## 2024-11-30 DIAGNOSIS — N184 Chronic kidney disease, stage 4 (severe): Secondary | ICD-10-CM | POA: Insufficient documentation

## 2024-11-30 DIAGNOSIS — I2510 Atherosclerotic heart disease of native coronary artery without angina pectoris: Secondary | ICD-10-CM | POA: Diagnosis not present

## 2024-11-30 DIAGNOSIS — K921 Melena: Secondary | ICD-10-CM | POA: Diagnosis not present

## 2024-11-30 DIAGNOSIS — Z8551 Personal history of malignant neoplasm of bladder: Secondary | ICD-10-CM | POA: Insufficient documentation

## 2024-11-30 HISTORY — PX: ENTEROSCOPY: SHX5533

## 2024-11-30 HISTORY — PX: ESOPHAGOGASTRODUODENOSCOPY: SHX5428

## 2024-11-30 HISTORY — PX: HOT HEMOSTASIS: SHX5433

## 2024-11-30 SURGERY — EGD (ESOPHAGOGASTRODUODENOSCOPY)
Anesthesia: Monitor Anesthesia Care

## 2024-11-30 MED ORDER — PROPOFOL 10 MG/ML IV BOLUS
INTRAVENOUS | Status: DC | PRN
  Administered 2024-11-30: 50 mg via INTRAVENOUS

## 2024-11-30 MED ORDER — PROPOFOL 500 MG/50ML IV EMUL
INTRAVENOUS | Status: DC | PRN
  Administered 2024-11-30: 150 ug/kg/min via INTRAVENOUS

## 2024-11-30 MED ORDER — LIDOCAINE 2% (20 MG/ML) 5 ML SYRINGE
INTRAMUSCULAR | Status: DC | PRN
  Administered 2024-11-30: 80 mg via INTRAVENOUS

## 2024-11-30 MED ORDER — PANTOPRAZOLE SODIUM 40 MG PO TBEC: 40.0000 mg | DELAYED_RELEASE_TABLET | Freq: Two times a day (BID) | ORAL | 0 refills | Status: AC

## 2024-11-30 MED ORDER — LACTATED RINGERS IV SOLN: INTRAVENOUS | Status: DC

## 2024-11-30 MED ORDER — STERILE WATER FOR IRRIGATION IR SOLN
Status: DC | PRN
  Administered 2024-11-30: 120 mL

## 2024-11-30 NOTE — Discharge Instructions (Signed)
 You are being discharged to home.  Resume your previous diet.  We are waiting for your pathology results.  Take Protonix  (pantoprazole ) 40 mg by mouth twice a day for three months.  Restart clopidogrel  in 2 days. Return to your GI clinic in three months.

## 2024-11-30 NOTE — Op Note (Signed)
 [facility] Patient Name: Jeffery Ponce Procedure Date: 11/30/2024 9:46 AM MRN: [id number] Date of Birth: [birth year] Attending MD: Toribio Fortune , , [id number] CSN: [id number] Age: 78 Admit Type: Outpatient Procedure:                Small bowel enteroscopy Indications:              Iron  deficiency anemia, Melena, history of gastric                            AVMs Providers:                Toribio Fortune, Madelin Hunter, RN, Daphne Mulch                            Technician, Technician Referring MD:              Medicines:                Monitored Anesthesia Care Complications:            No immediate complications. Estimated Blood Loss:     Estimated blood loss: none. Procedure:                Pre-Anesthesia Assessment:                           - Prior to the procedure, a History and Physical                            was performed, and patient medications, allergies                            and sensitivities were reviewed. The patient's                            tolerance of previous anesthesia was reviewed.                           - The risks and benefits of the procedure and the                            sedation options and risks were discussed with the                            patient. All questions were answered and informed                            consent was obtained.                           - ASA Grade Assessment: III - A patient with severe                            systemic disease.                           After obtaining informed consent, the endoscope was  passed under direct vision. Throughout the                            procedure, the patient's blood pressure, pulse, and                            oxygen saturations were monitored continuously. The                            [id number]-[id number] ([id number]) Ultra Slim Colon was                            introduced through the mouth and advanced to the                             proximal jejunum. The small bowel enteroscopy was                            accomplished without difficulty. The patient                            tolerated the procedure well. Scope In: 10:06:51 AM Scope Out: 10:27:07 AM Total Procedure Duration: 0 hours 20 minutes 16 seconds  Findings:      The esophagus was normal.      Sixteen 2 to 4 mm angiodysplastic lesions with stigmata of recent       bleeding were found in the gastric fundus and in the gastric body.       Coagulation for hemostasis using argon plasma at 0.3 liters/minute and       20 watts was successful.      A single angiodysplastic lesion with no bleeding was found in the       duodenal bulb. Coagulation for bleeding prevention using argon plasma at       0.3 liters/minute and 20 watts was successful.      A single angiodysplastic lesion with no bleeding was found in the       proximal jejunum. Coagulation for bleeding prevention using argon plasma       at 0.3 liters/minute and 20 watts was successful.      Note: upon withdrawal, I noticed the fundus had an atrophic appearance       and lesions not apparent on initail inspection now were present, likely       due to gastric distention. Impression:               - Normal esophagus.                           - Sixteen recently bleeding angiodysplastic lesions                            in the stomach. Treated with argon plasma                            coagulation (APC).                           -  A single non-bleeding angiodysplastic lesion in                            the duodenum. Treated with argon plasma coagulation                            (APC).                           - A single non-bleeding angiodysplastic lesion in                            the duodenum. Treated with argon plasma coagulation                            (APC).                           - No specimens collected. Moderate Sedation:      Per Anesthesia Care Recommendation:           -  Discharge patient to home (ambulatory).                           - Resume previous diet.                           - Await pathology results.                           - Use Protonix  (pantoprazole ) 40 mg PO BID for 3                            months.                           - Restart clopidogrel  in 2 days.                           - Return to GI clinic in 3 months. Needs CBC and                            iron  stores recheck. If persistent drop, may                            consider octreotide IM. Procedure Code(s):        --- Professional ---                           [phone], Small intestinal endoscopy, enteroscopy                            beyond second portion of duodenum, not including                            ileum; with control of bleeding (eg, injection,  bipolar cautery, unipolar cautery, laser, heater                            probe, stapler, plasma coagulator) Diagnosis Code(s):        --- Professional ---                           K31.811, Angiodysplasia of stomach and duodenum                            with bleeding                           D50.9, Iron  deficiency anemia, unspecified                           K92.1, Melena (includes Hematochezia) CPT copyright 2022 American Medical Association. All rights reserved. The codes documented in this report are preliminary and upon coder review may  be revised to meet current compliance requirements. Toribio Fortune, MD Toribio Fortune,  11/30/2024 10:36:00 AM This report has been signed electronically. Number of Addenda: 0
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
## 2024-11-30 NOTE — Anesthesia Preprocedure Evaluation (Signed)
"                                    Anesthesia Evaluation  Patient identified by MRN, date of birth, ID band Patient awake    Reviewed: Allergy & Precautions, H&P , NPO status , Patient's Chart, lab work & pertinent test results, reviewed documented beta blocker date and time   Airway Mallampati: II  TM Distance: >3 FB Neck ROM: full    Dental no notable dental hx.    Pulmonary neg pulmonary ROS, pneumonia, COPD, former smoker   Pulmonary exam normal breath sounds clear to auscultation       Cardiovascular Exercise Tolerance: Good hypertension, + CAD and + Peripheral Vascular Disease  negative cardio ROS + Valvular Problems/Murmurs  Rhythm:regular Rate:Normal     Neuro/Psych negative neurological ROS  negative psych ROS   GI/Hepatic negative GI ROS,GERD  ,,(+) Cirrhosis   Esophageal Varices      Endo/Other  negative endocrine ROS    Renal/GU Renal diseasenegative Renal ROS  negative genitourinary   Musculoskeletal  (+) Arthritis ,    Abdominal   Peds  Hematology negative hematology ROS (+) Blood dyscrasia, anemia   Anesthesia Other Findings   Reproductive/Obstetrics negative OB ROS                              Anesthesia Physical Anesthesia Plan  ASA: 3  Anesthesia Plan: MAC   Post-op Pain Management:    Induction:   PONV Risk Score and Plan: Propofol  infusion  Airway Management Planned:   Additional Equipment:   Intra-op Plan:   Post-operative Plan:   Informed Consent: I have reviewed the patients History and Physical, chart, labs and discussed the procedure including the risks, benefits and alternatives for the proposed anesthesia with the patient or authorized representative who has indicated his/her understanding and acceptance.     Dental Advisory Given  Plan Discussed with: CRNA  Anesthesia Plan Comments:         Anesthesia Quick Evaluation  "

## 2024-11-30 NOTE — Transfer of Care (Signed)
 Immediate Anesthesia Transfer of Care Note  Patient: Jeffery Ponce  Procedure(s) Performed: EGD (ESOPHAGOGASTRODUODENOSCOPY) ENTEROSCOPY EGD, WITH ARGON PLASMA COAGULATION  Patient Location: Short Stay  Anesthesia Type:MAC  Level of Consciousness: awake, alert , oriented, and patient cooperative  Airway & Oxygen Therapy: Patient Spontanous Breathing  Post-op Assessment: Report given to RN, Post -op Vital signs reviewed and stable, and Patient moving all extremities X 4  Post vital signs: Reviewed and stable  Last Vitals:  Vitals Value Taken Time  BP 106/52 11/30/24 10:33  Temp 36.9 C 11/30/24 10:33  Pulse 65 11/30/24 10:33  Resp 18 11/30/24 10:33  SpO2 99 % 11/30/24 10:33    Last Pain:  Vitals:   11/30/24 1033  PainSc: 0-No pain         Complications: No notable events documented.

## 2024-12-01 ENCOUNTER — Encounter: Payer: Self-pay | Admitting: Physician Assistant

## 2024-12-03 NOTE — Anesthesia Postprocedure Evaluation (Signed)
"   Anesthesia Post Note  Patient: Jeffery Ponce  Procedure(s) Performed: EGD (ESOPHAGOGASTRODUODENOSCOPY) ENTEROSCOPY EGD, WITH ARGON PLASMA COAGULATION  Patient location during evaluation: Phase II Anesthesia Type: MAC Level of consciousness: awake Pain management: pain level controlled Vital Signs Assessment: post-procedure vital signs reviewed and stable Respiratory status: spontaneous breathing and respiratory function stable Cardiovascular status: blood pressure returned to baseline and stable Postop Assessment: no headache and no apparent nausea or vomiting Anesthetic complications: no Comments: Late entry   No notable events documented.   Last Vitals:  Vitals:   11/30/24 0833 11/30/24 1033  BP: (!) 126/56 (!) 106/52  Pulse:  65  Resp: 14 18  Temp: 36.7 C 36.9 C  SpO2: 97% 99%    Last Pain:  Vitals:   11/30/24 1033  PainSc: 0-No pain                 Yvonna PARAS Elaine Roanhorse      "

## 2024-12-07 ENCOUNTER — Inpatient Hospital Stay

## 2024-12-07 ENCOUNTER — Encounter (HOSPITAL_COMMUNITY): Payer: Self-pay | Admitting: Gastroenterology

## 2024-12-07 VITALS — BP 118/52 | HR 63 | Temp 96.6°F | Resp 18

## 2024-12-07 DIAGNOSIS — D500 Iron deficiency anemia secondary to blood loss (chronic): Secondary | ICD-10-CM

## 2024-12-07 DIAGNOSIS — N184 Chronic kidney disease, stage 4 (severe): Secondary | ICD-10-CM | POA: Diagnosis not present

## 2024-12-07 DIAGNOSIS — D631 Anemia in chronic kidney disease: Secondary | ICD-10-CM

## 2024-12-07 DIAGNOSIS — D649 Anemia, unspecified: Secondary | ICD-10-CM

## 2024-12-07 MED ORDER — SODIUM CHLORIDE 0.9 % IV SOLN: INTRAVENOUS | Status: DC

## 2024-12-07 MED ORDER — IRON SUCROSE 300 MG IVPB - SIMPLE MED
300.0000 mg | Freq: Once | Status: AC
  Administered 2024-12-07: 300 mg via INTRAVENOUS
  Filled 2024-12-07: qty 300

## 2024-12-07 NOTE — Patient Instructions (Signed)
 [facility] [place] - A DEPT OF [facility]  Discharge Instructions: Thank you for choosing [facility] to provide your oncology and hematology care.  If you have a lab appointment with the Cancer Center - please note that after April 8th, 2024, all labs will be drawn in the cancer center.  You do not have to check in or register with the main entrance as you have in the past but will complete your check-in in the cancer center.  Wear comfortable clothing and clothing appropriate for easy access to any Portacath or PICC line.   We strive to give you quality time with your provider. You may need to reschedule your appointment if you arrive late (15 or more minutes).  Arriving late affects you and other patients whose appointments are after yours.  Also, if you miss three or more appointments without notifying the office, you may be dismissed from the clinic at the provider's discretion.      For prescription refill requests, have your pharmacy contact our office and allow 72 hours for refills to be completed.    Today you received the following venofer iron infusion   To help prevent nausea and vomiting after your treatment, we encourage you to take your nausea medication as directed.  BELOW ARE SYMPTOMS THAT SHOULD BE REPORTED IMMEDIATELY: *FEVER GREATER THAN 100.4 F (38 C) OR HIGHER *CHILLS OR SWEATING *NAUSEA AND VOMITING THAT IS NOT CONTROLLED WITH YOUR NAUSEA MEDICATION *UNUSUAL SHORTNESS OF BREATH *UNUSUAL BRUISING OR BLEEDING *URINARY PROBLEMS (pain or burning when urinating, or frequent urination) *BOWEL PROBLEMS (unusual diarrhea, constipation, pain near the anus) TENDERNESS IN MOUTH AND THROAT WITH OR WITHOUT PRESENCE OF ULCERS (sore throat, sores in mouth, or a toothache) UNUSUAL RASH, SWELLING OR PAIN  UNUSUAL VAGINAL DISCHARGE OR ITCHING   Items with * indicate a potential emergency and should be followed up as soon as possible or go to  the Emergency Department if any problems should occur.  Please show the CHEMOTHERAPY ALERT CARD or IMMUNOTHERAPY ALERT CARD at check-in to the Emergency Department and triage nurse.  Should you have questions after your visit or need to cancel or reschedule your appointment, please contact [facility] [place] - A DEPT OF [facility] [phone]  and follow the prompts.  Office hours are 8:00 a.m. to 4:30 p.m. Monday - Friday. Please note that voicemails left after 4:00 p.m. may not be returned until the following business day.  We are closed weekends and major holidays. You have access to a nurse at all times for urgent questions. Please call the main number to the clinic [phone] and follow the prompts.  For any non-urgent questions, you may also contact your provider using MyChart. We now offer e-Visits for anyone 37 and older to request care online for non-urgent symptoms. For details visit [email or web address].   Also download the MyChart app! Go to the app store, search "MyChart", open the app, select [facility], and log in with your MyChart username and password.

## 2024-12-07 NOTE — Progress Notes (Signed)
 Pt took two tylenol  and an allergy pill around 0630   Venofer  iron  infusion given per orders. Patient tolerated it well without problems. Vitals stable and discharged home from clinic ambulatory. Follow up as scheduled.

## 2024-12-10 ENCOUNTER — Ambulatory Visit (HOSPITAL_COMMUNITY)
Admission: RE | Admit: 2024-12-10 | Discharge: 2024-12-10 | Disposition: A | Discharge status: Home / Self Care | Source: Ambulatory Visit | Attending: Physician Assistant | Admitting: Physician Assistant

## 2024-12-10 DIAGNOSIS — R911 Solitary pulmonary nodule: Secondary | ICD-10-CM | POA: Insufficient documentation

## 2024-12-10 DIAGNOSIS — R9389 Abnormal findings on diagnostic imaging of other specified body structures: Secondary | ICD-10-CM | POA: Insufficient documentation

## 2024-12-13 ENCOUNTER — Inpatient Hospital Stay: Attending: Physician Assistant

## 2024-12-13 ENCOUNTER — Inpatient Hospital Stay

## 2024-12-20 ENCOUNTER — Ambulatory Visit: Payer: Self-pay | Admitting: Physician Assistant

## 2024-12-20 ENCOUNTER — Encounter: Payer: Self-pay | Admitting: Physician Assistant

## 2024-12-20 ENCOUNTER — Other Ambulatory Visit: Payer: Self-pay | Admitting: *Deleted

## 2024-12-20 DIAGNOSIS — R911 Solitary pulmonary nodule: Secondary | ICD-10-CM

## 2024-12-20 DIAGNOSIS — R9389 Abnormal findings on diagnostic imaging of other specified body structures: Secondary | ICD-10-CM

## 2024-12-20 NOTE — Progress Notes (Signed)
 History of pulmonary nodules. CT chest without contrast (12/10/2024) showed new 6 mm LUL pulmonary nodule. We will obtain a follow-up CT chest in 3 months.  NURSES:  Please inform patient of new pulmonary nodule.  Previous nodules have been infectious/inflammatory and have resolved on their own, but we will check another CT scan in 3 months to make sure there is no evidence of developing cancer.  Pleasant CHRISTELLA Barefoot, [name]  12/20/2024 9:53 AM

## 2024-12-20 NOTE — Progress Notes (Signed)
 Patient aware and verbalized understanding.  Will schedule CT Chest for 3 months as planned.

## 2024-12-27 ENCOUNTER — Inpatient Hospital Stay

## 2024-12-27 VITALS — BP 124/55 | HR 100 | Temp 97.0°F | Resp 20 | Wt 150.8 lb

## 2024-12-27 DIAGNOSIS — D500 Iron deficiency anemia secondary to blood loss (chronic): Secondary | ICD-10-CM

## 2024-12-27 DIAGNOSIS — D631 Anemia in chronic kidney disease: Secondary | ICD-10-CM

## 2024-12-27 DIAGNOSIS — D649 Anemia, unspecified: Secondary | ICD-10-CM

## 2024-12-27 LAB — SAMPLE TO BLOOD BANK

## 2024-12-27 LAB — CBC
HCT: 30.5 % — ABNORMAL LOW (ref 39.0–52.0)
Hemoglobin: 9 g/dL — ABNORMAL LOW (ref 13.0–17.0)
MCH: 27.1 pg (ref 26.0–34.0)
MCHC: 29.5 g/dL — ABNORMAL LOW (ref 30.0–36.0)
MCV: 91.9 fL (ref 80.0–100.0)
Platelets: 317 K/uL (ref 150–400)
RBC: 3.32 MIL/uL — ABNORMAL LOW (ref 4.22–5.81)
RDW: 15 % (ref 11.5–15.5)
WBC: 7.3 K/uL (ref 4.0–10.5)
nRBC: 0 % (ref 0.0–0.2)

## 2024-12-27 MED ORDER — EPOETIN ALFA-EPBX 10000 UNIT/ML IJ SOLN
10000.0000 [IU] | Freq: Once | INTRAMUSCULAR | Status: AC
  Administered 2024-12-27: 10000 [IU] via SUBCUTANEOUS

## 2024-12-27 NOTE — Progress Notes (Signed)
 Patient's Hgb 9 and blood pressure stable. Patient tolerated Retacrit  injection with no complaints voiced.  Site clean and dry with no bruising or swelling noted at site.  See MAR for details.  Band aid applied.  Patient stable during and after injection.  Vss with discharge and left in satisfactory condition with no s/s of distress noted. All follow ups as scheduled.   Jeffery Ponce

## 2024-12-27 NOTE — Patient Instructions (Signed)

## 2025-01-07 ENCOUNTER — Encounter: Payer: Self-pay | Admitting: Physician Assistant

## 2025-01-10 ENCOUNTER — Inpatient Hospital Stay: Payer: Self-pay

## 2025-01-11 ENCOUNTER — Inpatient Hospital Stay

## 2025-01-11 ENCOUNTER — Ambulatory Visit: Payer: Self-pay | Admitting: Physician Assistant

## 2025-01-11 VITALS — BP 114/58 | HR 98 | Temp 96.5°F | Resp 20 | Wt 151.0 lb

## 2025-01-11 DIAGNOSIS — D500 Iron deficiency anemia secondary to blood loss (chronic): Secondary | ICD-10-CM

## 2025-01-11 DIAGNOSIS — D649 Anemia, unspecified: Secondary | ICD-10-CM

## 2025-01-11 DIAGNOSIS — D631 Anemia in chronic kidney disease: Secondary | ICD-10-CM

## 2025-01-11 LAB — CBC
HCT: 28.2 % — ABNORMAL LOW (ref 39.0–52.0)
Hemoglobin: 8.2 g/dL — ABNORMAL LOW (ref 13.0–17.0)
MCH: 27.1 pg (ref 26.0–34.0)
MCHC: 29.1 g/dL — ABNORMAL LOW (ref 30.0–36.0)
MCV: 93.1 fL (ref 80.0–100.0)
Platelets: 288 10*3/uL (ref 150–400)
RBC: 3.03 MIL/uL — ABNORMAL LOW (ref 4.22–5.81)
RDW: 15.5 % (ref 11.5–15.5)
WBC: 6.2 10*3/uL (ref 4.0–10.5)
nRBC: 0 % (ref 0.0–0.2)

## 2025-01-11 LAB — SAMPLE TO BLOOD BANK

## 2025-01-11 MED ORDER — EPOETIN ALFA-EPBX 20000 UNIT/ML IJ SOLN
20000.0000 [IU] | Freq: Once | INTRAMUSCULAR | Status: AC
  Administered 2025-01-11: 20000 [IU] via SUBCUTANEOUS
  Filled 2025-01-11: qty 1

## 2025-01-11 NOTE — Progress Notes (Signed)
 Hgb remains less than goal. We will increase dose of Retacrit  to give 20,000 units every 2 weeks.  Pleasant CHRISTELLA Barefoot, [name] 01/11/25 12:59 PM

## 2025-01-11 NOTE — Progress Notes (Signed)
 Patient's Hgb 8.2 and blood pressure stable. Patient asymptomatic. patient tolerated retacrit  injection with no complaints voiced.  Site clean and dry with no bruising or swelling noted at site.  See MAR for details.  Band aid applied.  Patient stable during and after injection.  Vss with discharge and left in satisfactory condition with no s/s of distress noted. All follow ups as scheduled.   Aveline Daus

## 2025-01-24 ENCOUNTER — Inpatient Hospital Stay: Payer: Self-pay

## 2025-01-26 ENCOUNTER — Encounter (INDEPENDENT_AMBULATORY_CARE_PROVIDER_SITE_OTHER)

## 2025-01-26 ENCOUNTER — Ambulatory Visit (INDEPENDENT_AMBULATORY_CARE_PROVIDER_SITE_OTHER): Admitting: Vascular Surgery

## 2025-02-04 ENCOUNTER — Encounter (INDEPENDENT_AMBULATORY_CARE_PROVIDER_SITE_OTHER)

## 2025-02-04 ENCOUNTER — Ambulatory Visit (INDEPENDENT_AMBULATORY_CARE_PROVIDER_SITE_OTHER): Admitting: Nurse Practitioner

## 2025-02-07 ENCOUNTER — Inpatient Hospital Stay: Payer: Self-pay

## 2025-02-21 ENCOUNTER — Inpatient Hospital Stay: Payer: Self-pay | Admitting: Physician Assistant

## 2025-02-21 ENCOUNTER — Inpatient Hospital Stay: Payer: Self-pay

## 2025-03-01 ENCOUNTER — Ambulatory Visit (INDEPENDENT_AMBULATORY_CARE_PROVIDER_SITE_OTHER): Admitting: Gastroenterology

## 2025-03-15 ENCOUNTER — Other Ambulatory Visit (HOSPITAL_COMMUNITY)
